# Patient Record
Sex: Female | Born: 1935 | Race: White | Hispanic: No | Marital: Married | Smoking: Never smoker
Health system: Southern US, Community
[De-identification: ages and names within clinical notes are randomized; demographics above are authoritative.]

## PROBLEM LIST (undated history)

## (undated) DIAGNOSIS — I10 Essential (primary) hypertension: Secondary | ICD-10-CM

## (undated) DIAGNOSIS — H409 Unspecified glaucoma: Secondary | ICD-10-CM

## (undated) DIAGNOSIS — H9190 Unspecified hearing loss, unspecified ear: Secondary | ICD-10-CM

## (undated) DIAGNOSIS — E079 Disorder of thyroid, unspecified: Secondary | ICD-10-CM

## (undated) DIAGNOSIS — E039 Hypothyroidism, unspecified: Secondary | ICD-10-CM

## (undated) DIAGNOSIS — I5032 Chronic diastolic (congestive) heart failure: Secondary | ICD-10-CM

## (undated) HISTORY — DX: Unspecified glaucoma: H40.9

## (undated) HISTORY — PX: SPLENECTOMY, TOTAL: SHX788

---

## 1998-10-02 HISTORY — PX: CHOLECYSTECTOMY: SHX55

## 1998-11-04 ENCOUNTER — Other Ambulatory Visit: Admission: RE | Admit: 1998-11-04 | Discharge: 1998-11-04 | Payer: Self-pay | Admitting: Obstetrics and Gynecology

## 2002-05-26 ENCOUNTER — Other Ambulatory Visit: Admission: RE | Admit: 2002-05-26 | Discharge: 2002-05-26 | Payer: Self-pay | Admitting: Internal Medicine

## 2002-11-27 ENCOUNTER — Encounter: Payer: Self-pay | Admitting: Urology

## 2002-12-04 ENCOUNTER — Inpatient Hospital Stay (HOSPITAL_COMMUNITY): Admission: RE | Admit: 2002-12-04 | Discharge: 2002-12-05 | Payer: Self-pay | Admitting: Urology

## 2008-01-23 ENCOUNTER — Ambulatory Visit (HOSPITAL_COMMUNITY): Admission: RE | Admit: 2008-01-23 | Discharge: 2008-01-24 | Payer: Self-pay | Admitting: Orthopedic Surgery

## 2008-10-02 HISTORY — PX: ABDOMINAL HYSTERECTOMY: SHX81

## 2009-06-22 ENCOUNTER — Inpatient Hospital Stay (HOSPITAL_COMMUNITY): Admission: EM | Admit: 2009-06-22 | Discharge: 2009-06-25 | Payer: Self-pay | Admitting: Emergency Medicine

## 2009-06-28 ENCOUNTER — Encounter (INDEPENDENT_AMBULATORY_CARE_PROVIDER_SITE_OTHER): Payer: Self-pay | Admitting: General Surgery

## 2009-06-28 ENCOUNTER — Inpatient Hospital Stay (HOSPITAL_COMMUNITY): Admission: EM | Admit: 2009-06-28 | Discharge: 2009-07-04 | Payer: Self-pay | Admitting: Emergency Medicine

## 2009-07-09 ENCOUNTER — Encounter (INDEPENDENT_AMBULATORY_CARE_PROVIDER_SITE_OTHER): Payer: Self-pay | Admitting: Cardiology

## 2009-07-09 ENCOUNTER — Inpatient Hospital Stay (HOSPITAL_COMMUNITY): Admission: AD | Admit: 2009-07-09 | Discharge: 2009-07-13 | Payer: Self-pay | Admitting: Cardiology

## 2009-07-09 ENCOUNTER — Encounter: Admission: RE | Admit: 2009-07-09 | Discharge: 2009-07-09 | Payer: Self-pay | Admitting: General Surgery

## 2009-07-12 ENCOUNTER — Encounter (INDEPENDENT_AMBULATORY_CARE_PROVIDER_SITE_OTHER): Payer: Self-pay | Admitting: Cardiology

## 2009-07-14 ENCOUNTER — Emergency Department (HOSPITAL_COMMUNITY): Admission: EM | Admit: 2009-07-14 | Discharge: 2009-07-14 | Payer: Self-pay | Admitting: Emergency Medicine

## 2011-01-05 LAB — DIFFERENTIAL
Basophils Relative: 1 % (ref 0–1)
Eosinophils Absolute: 0.4 10*3/uL (ref 0.0–0.7)
Eosinophils Relative: 3 % (ref 0–5)
Lymphocytes Relative: 15 % (ref 12–46)
Lymphs Abs: 1.4 10*3/uL (ref 0.7–4.0)
Lymphs Abs: 2.1 10*3/uL (ref 0.7–4.0)
Monocytes Absolute: 1.2 10*3/uL — ABNORMAL HIGH (ref 0.1–1.0)
Neutro Abs: 6.8 10*3/uL (ref 1.7–7.7)
Neutrophils Relative %: 68 % (ref 43–77)

## 2011-01-05 LAB — CBC
HCT: 29.6 % — ABNORMAL LOW (ref 36.0–46.0)
HCT: 30.2 % — ABNORMAL LOW (ref 36.0–46.0)
Hemoglobin: 9.8 g/dL — ABNORMAL LOW (ref 12.0–15.0)
Hemoglobin: 9.9 g/dL — ABNORMAL LOW (ref 12.0–15.0)
MCHC: 33.2 g/dL (ref 30.0–36.0)
MCHC: 33.3 g/dL (ref 30.0–36.0)
MCV: 92.6 fL (ref 78.0–100.0)
MCV: 93.9 fL (ref 78.0–100.0)
MCV: 94 fL (ref 78.0–100.0)
MCV: 94 fL (ref 78.0–100.0)
Platelets: 336 10*3/uL (ref 150–400)
Platelets: 489 10*3/uL — ABNORMAL HIGH (ref 150–400)
Platelets: 509 10*3/uL — ABNORMAL HIGH (ref 150–400)
Platelets: 640 10*3/uL — ABNORMAL HIGH (ref 150–400)
RBC: 3.14 MIL/uL — ABNORMAL LOW (ref 3.87–5.11)
RBC: 3.16 MIL/uL — ABNORMAL LOW (ref 3.87–5.11)
RDW: 15.4 % (ref 11.5–15.5)
RDW: 16.5 % — ABNORMAL HIGH (ref 11.5–15.5)
WBC: 10.3 10*3/uL (ref 4.0–10.5)
WBC: 11.7 10*3/uL — ABNORMAL HIGH (ref 4.0–10.5)
WBC: 12.1 10*3/uL — ABNORMAL HIGH (ref 4.0–10.5)
WBC: 13.3 10*3/uL — ABNORMAL HIGH (ref 4.0–10.5)

## 2011-01-05 LAB — D-DIMER, QUANTITATIVE: D-Dimer, Quant: 2.84 ug/mL-FEU — ABNORMAL HIGH (ref 0.00–0.48)

## 2011-01-05 LAB — COMPREHENSIVE METABOLIC PANEL
AST: 19 U/L (ref 0–37)
AST: 24 U/L (ref 0–37)
Albumin: 2.7 g/dL — ABNORMAL LOW (ref 3.5–5.2)
Albumin: 3 g/dL — ABNORMAL LOW (ref 3.5–5.2)
BUN: 7 mg/dL (ref 6–23)
Calcium: 9 mg/dL (ref 8.4–10.5)
Calcium: 9.5 mg/dL (ref 8.4–10.5)
Chloride: 102 mEq/L (ref 96–112)
Creatinine, Ser: 0.91 mg/dL (ref 0.4–1.2)
Creatinine, Ser: 0.93 mg/dL (ref 0.4–1.2)
GFR calc Af Amer: >60 mL/min (ref >60)
GFR calc Af Amer: >60 mL/min (ref >60)
Total Bilirubin: 0.4 mg/dL (ref 0.3–1.2)

## 2011-01-05 LAB — BASIC METABOLIC PANEL
BUN: 5 mg/dL — ABNORMAL LOW (ref 6–23)
Chloride: 106 mEq/L (ref 96–112)
Chloride: 110 mEq/L (ref 96–112)
Creatinine, Ser: 0.8 mg/dL (ref 0.4–1.2)
GFR calc Af Amer: >60 mL/min (ref >60)
Glucose, Bld: 93 mg/dL (ref 70–99)
Potassium: 3.4 mEq/L — ABNORMAL LOW (ref 3.5–5.1)
Potassium: 3.8 mEq/L (ref 3.5–5.1)

## 2011-01-05 LAB — PROTEIN S, TOTAL: Protein S Ag, Total: 119 % (ref 70–140)

## 2011-01-05 LAB — LIPASE, BLOOD: Lipase: 22 U/L (ref 11–59)

## 2011-01-05 LAB — PROTIME-INR
INR: 1.22 (ref 0.00–1.49)
INR: 3.01 — ABNORMAL HIGH (ref 0.00–1.49)
INR: 3.1 — ABNORMAL HIGH (ref 0.00–1.49)
Prothrombin Time: 25.3 seconds — ABNORMAL HIGH (ref 11.6–15.2)
Prothrombin Time: 31 seconds — ABNORMAL HIGH (ref 11.6–15.2)

## 2011-01-05 LAB — BETA-2-GLYCOPROTEIN I ABS, IGG/M/A: Beta-2-Glycoprotein I IgA: 4 U/mL (ref <=15)

## 2011-01-05 LAB — LUPUS ANTICOAGULANT PANEL
Drvvt confirmation: 1.13 Ratio (ref <1.18)
PTT Lupus Anticoagulant: 43.7 secs — ABNORMAL HIGH (ref 32.0–43.4)
PTTLA 4:1 Mix: 42.2 secs (ref 36.3–48.8)

## 2011-01-05 LAB — HEMOCCULT GUIAC POC 1CARD (OFFICE): Fecal Occult Bld: POSITIVE

## 2011-01-05 LAB — TSH: TSH: 16.256 u[IU]/mL — ABNORMAL HIGH (ref 0.350–4.500)

## 2011-01-05 LAB — CARDIOLIPIN ANTIBODIES, IGG, IGM, IGA
Anticardiolipin IgA: 5 APL U/mL — ABNORMAL LOW (ref <10)
Anticardiolipin IgG: 7 GPL U/mL — ABNORMAL LOW (ref <10)
Anticardiolipin IgM: 4 MPL U/mL — ABNORMAL LOW (ref <10)

## 2011-01-05 LAB — HEPARIN LEVEL (UNFRACTIONATED)
Heparin Unfractionated: 0.32 IU/mL (ref 0.30–0.70)
Heparin Unfractionated: 0.37 IU/mL (ref 0.30–0.70)

## 2011-01-05 LAB — LIPID PANEL
Cholesterol: 153 mg/dL (ref 0–200)
LDL Cholesterol: 99 mg/dL (ref 0–99)

## 2011-01-05 LAB — PROTHROMBIN GENE MUTATION

## 2011-01-06 LAB — HEMOGLOBIN AND HEMATOCRIT, BLOOD
HCT: 22.2 % — ABNORMAL LOW (ref 36.0–46.0)
HCT: 27.6 % — ABNORMAL LOW (ref 36.0–46.0)
HCT: 29.6 % — ABNORMAL LOW (ref 36.0–46.0)
HCT: 31.9 % — ABNORMAL LOW (ref 36.0–46.0)
HCT: 32 % — ABNORMAL LOW (ref 36.0–46.0)
HCT: 32.3 % — ABNORMAL LOW (ref 36.0–46.0)
HCT: 34.3 % — ABNORMAL LOW (ref 36.0–46.0)
Hemoglobin: 10.8 g/dL — ABNORMAL LOW (ref 12.0–15.0)
Hemoglobin: 10.9 g/dL — ABNORMAL LOW (ref 12.0–15.0)
Hemoglobin: 11.1 g/dL — ABNORMAL LOW (ref 12.0–15.0)
Hemoglobin: 11.4 g/dL — ABNORMAL LOW (ref 12.0–15.0)
Hemoglobin: 9.5 g/dL — ABNORMAL LOW (ref 12.0–15.0)

## 2011-01-06 LAB — POCT CARDIAC MARKERS
CKMB, poc: <1 ng/mL — ABNORMAL LOW (ref 1.0–8.0)
Myoglobin, poc: 72.3 ng/mL (ref 12–200)
Troponin i, poc: <0.05 ng/mL (ref 0.00–0.09)

## 2011-01-06 LAB — DIFFERENTIAL
Basophils Absolute: 0 10*3/uL (ref 0.0–0.1)
Basophils Absolute: 0 K/uL (ref 0.0–0.1)
Basophils Absolute: 0.1 10*3/uL (ref 0.0–0.1)
Basophils Relative: 0 % (ref 0–1)
Basophils Relative: 0 % (ref 0–1)
Basophils Relative: 1 % (ref 0–1)
Eosinophils Absolute: 0 10*3/uL (ref 0.0–0.7)
Eosinophils Absolute: 0 K/uL (ref 0.0–0.7)
Eosinophils Relative: 0 % (ref 0–5)
Eosinophils Relative: 0 % (ref 0–5)
Lymphocytes Relative: 12 % (ref 12–46)
Lymphocytes Relative: 6 % — ABNORMAL LOW (ref 12–46)
Lymphs Abs: 1 10*3/uL (ref 0.7–4.0)
Lymphs Abs: 1.3 K/uL (ref 0.7–4.0)
Monocytes Absolute: 1.2 K/uL — ABNORMAL HIGH (ref 0.1–1.0)
Monocytes Relative: 12 % (ref 3–12)
Monocytes Relative: 7 % (ref 3–12)
Neutro Abs: 13.8 10*3/uL — ABNORMAL HIGH (ref 1.7–7.7)
Neutro Abs: 8 K/uL — ABNORMAL HIGH (ref 1.7–7.7)
Neutrophils Relative %: 76 % (ref 43–77)
Neutrophils Relative %: 86 % — ABNORMAL HIGH (ref 43–77)
Neutrophils Relative %: 86 % — ABNORMAL HIGH (ref 43–77)

## 2011-01-06 LAB — COMPREHENSIVE METABOLIC PANEL
ALT: 17 U/L (ref 0–35)
ALT: 69 U/L — ABNORMAL HIGH (ref 0–35)
AST: 26 U/L (ref 0–37)
Albumin: 1.7 g/dL — ABNORMAL LOW (ref 3.5–5.2)
Alkaline Phosphatase: 143 U/L — ABNORMAL HIGH (ref 39–117)
Alkaline Phosphatase: 249 U/L — ABNORMAL HIGH (ref 39–117)
BUN: 18 mg/dL (ref 6–23)
CO2: 25 mEq/L (ref 19–32)
Calcium: 9.7 mg/dL (ref 8.4–10.5)
Chloride: 105 mEq/L (ref 96–112)
Creatinine, Ser: 0.91 mg/dL (ref 0.4–1.2)
GFR calc Af Amer: >60 mL/min (ref >60)
GFR calc non Af Amer: 52 mL/min — ABNORMAL LOW (ref >60)
Glucose, Bld: 162 mg/dL — ABNORMAL HIGH (ref 70–99)
Glucose, Bld: 225 mg/dL — ABNORMAL HIGH (ref 70–99)
Potassium: 4 mEq/L (ref 3.5–5.1)
Potassium: 4.7 mEq/L (ref 3.5–5.1)
Sodium: 135 mEq/L (ref 135–145)
Sodium: 137 mEq/L (ref 135–145)
Total Bilirubin: 0.6 mg/dL (ref 0.3–1.2)
Total Bilirubin: 1.6 mg/dL — ABNORMAL HIGH (ref 0.3–1.2)
Total Protein: 3.8 g/dL — ABNORMAL LOW (ref 6.0–8.3)
Total Protein: 6 g/dL (ref 6.0–8.3)

## 2011-01-06 LAB — CBC
HCT: 29.6 % — ABNORMAL LOW (ref 36.0–46.0)
HCT: 29.6 % — ABNORMAL LOW (ref 36.0–46.0)
HCT: 29.8 % — ABNORMAL LOW (ref 36.0–46.0)
HCT: 30.9 % — ABNORMAL LOW (ref 36.0–46.0)
Hemoglobin: 10.2 g/dL — ABNORMAL LOW (ref 12.0–15.0)
Hemoglobin: 10.4 g/dL — ABNORMAL LOW (ref 12.0–15.0)
Hemoglobin: 10.7 g/dL — ABNORMAL LOW (ref 12.0–15.0)
Hemoglobin: 10.9 g/dL — ABNORMAL LOW (ref 12.0–15.0)
Hemoglobin: 13 g/dL (ref 12.0–15.0)
Hemoglobin: 8.6 g/dL — ABNORMAL LOW (ref 12.0–15.0)
Hemoglobin: 9.9 g/dL — ABNORMAL LOW (ref 12.0–15.0)
MCHC: 33.5 g/dL (ref 30.0–36.0)
MCHC: 33.7 g/dL (ref 30.0–36.0)
MCHC: 34.1 g/dL (ref 30.0–36.0)
MCHC: 34.5 g/dL (ref 30.0–36.0)
MCHC: 34.6 g/dL (ref 30.0–36.0)
MCHC: 35.2 g/dL (ref 30.0–36.0)
MCV: 93 fL (ref 78.0–100.0)
MCV: 93.6 fL (ref 78.0–100.0)
MCV: 93.6 fL (ref 78.0–100.0)
MCV: 93.7 fL (ref 78.0–100.0)
MCV: 94.3 fL (ref 78.0–100.0)
MCV: 95.4 fL (ref 78.0–100.0)
Platelets: 174 10*3/uL (ref 150–400)
Platelets: 196 10*3/uL (ref 150–400)
Platelets: 223 10*3/uL (ref 150–400)
Platelets: 79 K/uL — ABNORMAL LOW (ref 150–400)
Platelets: 90 K/uL — ABNORMAL LOW (ref 150–400)
RBC: 3.16 MIL/uL — ABNORMAL LOW (ref 3.87–5.11)
RBC: 3.27 MIL/uL — ABNORMAL LOW (ref 3.87–5.11)
RBC: 3.41 MIL/uL — ABNORMAL LOW (ref 3.87–5.11)
RBC: 4.08 MIL/uL (ref 3.87–5.11)
RDW: 12.7 % (ref 11.5–15.5)
RDW: 12.8 % (ref 11.5–15.5)
RDW: 13 % (ref 11.5–15.5)
RDW: 13.8 % (ref 11.5–15.5)
RDW: 15.2 % (ref 11.5–15.5)
RDW: 15.3 % (ref 11.5–15.5)
RDW: 15.4 % (ref 11.5–15.5)
WBC: 10.6 10*3/uL — ABNORMAL HIGH (ref 4.0–10.5)
WBC: 11.4 K/uL — ABNORMAL HIGH (ref 4.0–10.5)
WBC: 11.7 10*3/uL — ABNORMAL HIGH (ref 4.0–10.5)
WBC: 12.1 K/uL — ABNORMAL HIGH (ref 4.0–10.5)

## 2011-01-06 LAB — BASIC METABOLIC PANEL WITH GFR
BUN: 14 mg/dL (ref 6–23)
CO2: 28 meq/L (ref 19–32)
Calcium: 9 mg/dL (ref 8.4–10.5)
Chloride: 108 meq/L (ref 96–112)
Creatinine, Ser: 0.98 mg/dL (ref 0.4–1.2)
GFR calc non Af Amer: 55 mL/min — ABNORMAL LOW
Glucose, Bld: 142 mg/dL — ABNORMAL HIGH (ref 70–99)
Potassium: 3.9 meq/L (ref 3.5–5.1)
Sodium: 140 meq/L (ref 135–145)

## 2011-01-06 LAB — BASIC METABOLIC PANEL
CO2: 22 mEq/L (ref 19–32)
CO2: 23 mEq/L (ref 19–32)
Calcium: 7.2 mg/dL — ABNORMAL LOW (ref 8.4–10.5)
Calcium: 7.3 mg/dL — ABNORMAL LOW (ref 8.4–10.5)
Chloride: 111 mEq/L (ref 96–112)
Creatinine, Ser: 0.71 mg/dL (ref 0.4–1.2)
Creatinine, Ser: 0.77 mg/dL (ref 0.4–1.2)
GFR calc Af Amer: >60 mL/min (ref >60)
Glucose, Bld: 127 mg/dL — ABNORMAL HIGH (ref 70–99)
Glucose, Bld: 197 mg/dL — ABNORMAL HIGH (ref 70–99)
Sodium: 136 mEq/L (ref 135–145)

## 2011-01-06 LAB — CROSSMATCH
ABO/RH(D): O POS
Antibody Screen: NEGATIVE

## 2011-01-06 LAB — PROTIME-INR
INR: 1.2 (ref 0.00–1.49)
Prothrombin Time: 14.8 s (ref 11.6–15.2)

## 2011-01-06 LAB — URINALYSIS, ROUTINE W REFLEX MICROSCOPIC
Protein, ur: NEGATIVE mg/dL
Specific Gravity, Urine: 1.031 — ABNORMAL HIGH (ref 1.005–1.030)
Urobilinogen, UA: 1 mg/dL (ref 0.0–1.0)

## 2011-01-06 LAB — D-DIMER, QUANTITATIVE

## 2011-01-06 LAB — TSH: TSH: 0.304 u[IU]/mL — ABNORMAL LOW (ref 0.350–4.500)

## 2011-01-06 LAB — SAMPLE TO BLOOD BANK

## 2011-01-06 LAB — APTT: aPTT: 24 seconds (ref 24–37)

## 2011-01-06 LAB — TYPE AND SCREEN
ABO/RH(D): O POS
Antibody Screen: NEGATIVE

## 2011-01-06 LAB — URINE MICROSCOPIC-ADD ON

## 2011-02-14 NOTE — Op Note (Signed)
NAMERUTHELLEN, TIPPY          ACCOUNT NO.:  [id number]   MEDICAL RECORD NO.:  [id number]          PATIENT TYPE:  AMB   LOCATION:  DAY                          FACILITY:  [facility]   PHYSICIAN:  Georges Lynch. Gioffre, M.D.DATE OF BIRTH:  [birth year]   DATE OF PROCEDURE:  01/23/2008  DATE OF DISCHARGE:                               OPERATIVE REPORT   SURGEON:  Georges Lynch. Darrelyn Hillock, M.D.   ASSISTANT:  Marlowe Kays, M.D.   PREOPERATIVE DIAGNOSES:  1. 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  2. 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   POSTOPERATIVE DIAGNOSES:  1. 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  2. 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   PROCEDURE:  Under general anesthesia, the patient on spinal frame, she  first had 1 gram of IV Ancef.  A routine orthopedic prep of the lower  back was carried out.  Two needles were placed in the back for  localization purposes.  At this time, an x-ray was taken to verify the  L5-S1 space.  At this particular time, an incision was made over the L5-  S1 interspace.  Bleeders identified and cauterized.  I separated the  muscle on both sides in order to position the [facility] retractor.  I  then went down the right side only for the hemilaminectomy and  completely decompressed the lateral recess.  We did this with the  microscope.  Another x-ray was taken.  A second x-ray was taken prior to  the decompression.  Once we did our hemilaminectomy, this time removed  the ligamentum flavum, I went down and did a nice foraminotomy for the  S1 root.  We went up proximally and laterally and decompressed the  lateral recess.  She had rather significant stenosis.  We cauterized the  lateral recess veins.  We then placed a needle into the disk space and  another x-ray was taken to verify our position.  We then went down into  the disk space and made a  cruciate incision.  The disk space was small,  but we were able to easily get into the space and we utilized a nerve  hook and the Epstein curettes to go out far laterally to decompress  laterally the foraminal disk fragments into the space and then removed  those.  I removed several large fragments with the nerve from the  foraminal area.  Great care was taken not to go out too far laterally.  We thoroughly irrigated out the area.  We were able to easily pass the  hockey stick out the foramina for both roots, the 5 root and the S1  root.  We had a nice thorough decompression, thoroughly irrigated out  the area, and loosely applied some thrombin-soaked Gelfoam, and the  wound was closed in  layers in usual fashion.  I left a small deep distal part of the wound  open for drainage purposes.  Subcu was closed with #0 Vicryl, skin with  metal staples.  Sterile Neosporin dressing was applied.  The patient  left the operating room in satisfactory condition.           ______________________________  Georges Lynch Darrelyn Hillock, M.D.     RAG/MEDQ  D:  01/23/2008  T:  01/23/2008  Job:  [id number]   cc:   Kirby Funk, M.D.  Fax:  [phone]

## 2011-02-17 NOTE — Op Note (Signed)
Mackenzie Madden, Mackenzie Madden                       ACCOUNT NO.:  [id number]   MEDICAL RECORD NO.:  [id number]                   PATIENT TYPE:  AMB   LOCATION:  DAY                                  FACILITY:  [facility]   PHYSICIAN:  Sigmund I. Patsi Sears, M.D.         DATE OF BIRTH:  06/26/1928   DATE OF PROCEDURE:  12/04/2002  DATE OF DISCHARGE:                                 OPERATIVE REPORT   PREOPERATIVE DIAGNOSES:  1. Stress urinary incontinence.  2. Cystocele.   POSTOPERATIVE DIAGNOSES:  1. Stress urinary incontinence.  2. Cystocele.   PROCEDURES:  1. Tension free obturator tape urethral sling.  2. Anterior repair.   SURGEON:  Sigmund I. Patsi Sears, M.D.   ASSISTANTS:  1. Crecencio Mc, M.D.  2. [facility]   ANESTHESIA:  General.   COMPLICATIONS:  None.   INDICATION:  Ms. Bozard is a 77 year old white female with stress  urinary incontinence demonstrated by urodynamics.  The patient also was  noted to have a grade 3 cystocele on physical examination.  After discussion  of options, the patient elected to proceed with a urethral sling as well as  anterior repair at that time.  Potential risks and benefits of the procedure  were explained to the patient, and she consented.   DESCRIPTION OF PROCEDURE:  The patient was taken to the operating room, and  a general anesthetic was administered.  The patient was given preoperative  antibiotics, placed in the dorsal lithotomy position, and prepped and draped  in the usual sterile fashion.  Next, a 16 French Foley catheter was inserted  in the bladder, and the bladder was drained.  Lidocaine 0.5% with  epinephrine was then injected into the anterior vaginal mucosa.  A total of  10 mL was injected.  Next, a vertical incision was made in the anterior  vaginal mucosa overlying the mid urethra.  Sharp dissection was then used to  dissect laterally on either side of the urethra until the anterior pubic  ramus could be easily  palpated with an index finger.  A small stab incision  was then made approximately 3 cm laterally at the level of the clitoral hood  over the skin.  The obturator trocars were then used to pierce the obturator  membrane and encircle the inferior pubic ramus.  The trocar was then brought  out through the vaginal incision under digital dissection.  The  polypropylene mesh sling was then attached to the trocar and brought back  out through the cutaneous incision.  A similar procedure was then performed  on the contralateral side.  The sling was then pulled up until it was in the  proper position overlying the mid urethra.  A right-angle was placed between  the urethra and the sling to ensure that it was not pulled too tightly.  The  vaginal mucosa was then closed with a running 3-0 Vicryl suture.  Cystoscopy  was then performed with  the 70 degree lens.  This demonstrated no evidence  of any bladder or urethral injury.  Foley catheter was then re-placed, and  the sling was cut at the skin level on either side.  These incision sites  were then closed with Dermabond.  Attention then turned to the anterior  vaginal repair.  A separate vertical incision overlying the anterior vaginal  mucosa was made superior to the previous incision.  The vaginal mucosa was  then sharply and bluntly dissected free from the underlying bladder.  Once  the vaginal mucosa had been mobilized from the bladder sufficiently both  laterally and superiorly, horizontal mattress sutures with 3-0 Vicryl were  placed into the pubovesical fascia on either side and tied down.  This  allowed the bladder to be held back up in its normal anatomic position.  A  total of four of these sutures were placed.  A small portion of redundant  vaginal mucosa was then excised.  Vaginal mucosa was then closed with a  running 3-0 Vicryl suture.  A vaginal packing with Estrace was placed, and  the procedure was ended.  The patient appeared to  tolerate the procedure  well and without complications.  She was able to be transferred to the  recovery unit in satisfactory condition.  Please note that Sigmund I.  Patsi Sears, M.D. was the operating surgeon and was present and participated  in the entire procedure.     Crecencio Mc, M.D.                          Sigmund I. Patsi Sears, M.D.    LB/MEDQ  D:  12/04/2002  T:  12/04/2002  Job:  [id number]

## 2011-02-21 ENCOUNTER — Other Ambulatory Visit: Payer: Self-pay | Admitting: Podiatry

## 2011-05-19 ENCOUNTER — Other Ambulatory Visit: Payer: Self-pay | Admitting: Internal Medicine

## 2011-05-19 ENCOUNTER — Ambulatory Visit
Admission: RE | Admit: 2011-05-19 | Discharge: 2011-05-19 | Disposition: A | Discharge status: Home / Self Care | Payer: Medicare Other | Source: Ambulatory Visit | Attending: Internal Medicine | Admitting: Internal Medicine

## 2011-05-19 DIAGNOSIS — R52 Pain, unspecified: Secondary | ICD-10-CM

## 2011-05-19 MED ORDER — IOHEXOL 300 MG/ML  SOLN
100.0000 mL | Freq: Once | INTRAMUSCULAR | Status: AC | PRN
  Administered 2011-05-19: 100 mL via INTRAVENOUS

## 2011-06-27 LAB — COMPREHENSIVE METABOLIC PANEL
ALT: 15
AST: 22
Albumin: 3.6
CO2: 30
Calcium: 9.6
Creatinine, Ser: 0.95
GFR calc Af Amer: >60
Sodium: 143
Total Protein: 6.5

## 2011-06-27 LAB — DIFFERENTIAL
Eosinophils Absolute: 0.1
Eosinophils Relative: 1
Lymphocytes Relative: 21
Lymphs Abs: 1.7
Monocytes Absolute: 0.6
Monocytes Relative: 7

## 2011-06-27 LAB — URINALYSIS, ROUTINE W REFLEX MICROSCOPIC
Bilirubin Urine: NEGATIVE
Glucose, UA: NEGATIVE
Hgb urine dipstick: NEGATIVE
Ketones, ur: NEGATIVE
Nitrite: NEGATIVE
Protein, ur: NEGATIVE
Specific Gravity, Urine: 1.007
Urobilinogen, UA: 0.2
pH: 7.5

## 2011-06-27 LAB — URINE MICROSCOPIC-ADD ON

## 2011-06-27 LAB — URINE CULTURE

## 2011-06-27 LAB — CBC
MCHC: 34.2
Platelets: 204
RBC: 4.56
RDW: 12.3

## 2011-07-26 ENCOUNTER — Other Ambulatory Visit: Payer: Self-pay | Admitting: Dermatology

## 2011-12-06 DIAGNOSIS — Z85828 Personal history of other malignant neoplasm of skin: Secondary | ICD-10-CM | POA: Diagnosis not present

## 2012-01-31 DIAGNOSIS — H409 Unspecified glaucoma: Secondary | ICD-10-CM | POA: Diagnosis not present

## 2012-01-31 DIAGNOSIS — H4011X0 Primary open-angle glaucoma, stage unspecified: Secondary | ICD-10-CM | POA: Diagnosis not present

## 2012-02-08 DIAGNOSIS — E039 Hypothyroidism, unspecified: Secondary | ICD-10-CM | POA: Diagnosis not present

## 2012-02-08 DIAGNOSIS — Z1331 Encounter for screening for depression: Secondary | ICD-10-CM | POA: Diagnosis not present

## 2012-02-08 DIAGNOSIS — I10 Essential (primary) hypertension: Secondary | ICD-10-CM | POA: Diagnosis not present

## 2012-02-20 DIAGNOSIS — Z1231 Encounter for screening mammogram for malignant neoplasm of breast: Secondary | ICD-10-CM | POA: Diagnosis not present

## 2012-03-06 DIAGNOSIS — L219 Seborrheic dermatitis, unspecified: Secondary | ICD-10-CM | POA: Diagnosis not present

## 2012-03-06 DIAGNOSIS — L821 Other seborrheic keratosis: Secondary | ICD-10-CM | POA: Diagnosis not present

## 2012-03-06 DIAGNOSIS — Z85828 Personal history of other malignant neoplasm of skin: Secondary | ICD-10-CM | POA: Diagnosis not present

## 2012-04-02 DIAGNOSIS — H4011X0 Primary open-angle glaucoma, stage unspecified: Secondary | ICD-10-CM | POA: Diagnosis not present

## 2012-04-02 DIAGNOSIS — H409 Unspecified glaucoma: Secondary | ICD-10-CM | POA: Diagnosis not present

## 2012-06-25 DIAGNOSIS — Z23 Encounter for immunization: Secondary | ICD-10-CM | POA: Diagnosis not present

## 2012-08-06 DIAGNOSIS — I10 Essential (primary) hypertension: Secondary | ICD-10-CM | POA: Diagnosis not present

## 2012-10-01 DIAGNOSIS — H409 Unspecified glaucoma: Secondary | ICD-10-CM | POA: Diagnosis not present

## 2012-10-01 DIAGNOSIS — H4011X0 Primary open-angle glaucoma, stage unspecified: Secondary | ICD-10-CM | POA: Diagnosis not present

## 2012-10-01 DIAGNOSIS — H2640 Unspecified secondary cataract: Secondary | ICD-10-CM | POA: Diagnosis not present

## 2012-10-01 DIAGNOSIS — H52209 Unspecified astigmatism, unspecified eye: Secondary | ICD-10-CM | POA: Diagnosis not present

## 2012-10-17 DIAGNOSIS — H2640 Unspecified secondary cataract: Secondary | ICD-10-CM | POA: Diagnosis not present

## 2012-10-17 DIAGNOSIS — H26499 Other secondary cataract, unspecified eye: Secondary | ICD-10-CM | POA: Diagnosis not present

## 2012-10-24 DIAGNOSIS — H26499 Other secondary cataract, unspecified eye: Secondary | ICD-10-CM | POA: Diagnosis not present

## 2012-10-24 DIAGNOSIS — H2640 Unspecified secondary cataract: Secondary | ICD-10-CM | POA: Diagnosis not present

## 2013-02-12 DIAGNOSIS — I10 Essential (primary) hypertension: Secondary | ICD-10-CM | POA: Diagnosis not present

## 2013-02-12 DIAGNOSIS — Z0000 Encounter for general adult medical examination without abnormal findings: Secondary | ICD-10-CM | POA: Diagnosis not present

## 2013-02-12 DIAGNOSIS — M810 Age-related osteoporosis without current pathological fracture: Secondary | ICD-10-CM | POA: Diagnosis not present

## 2013-02-12 DIAGNOSIS — G4700 Insomnia, unspecified: Secondary | ICD-10-CM | POA: Diagnosis not present

## 2013-02-12 DIAGNOSIS — Z1331 Encounter for screening for depression: Secondary | ICD-10-CM | POA: Diagnosis not present

## 2013-02-12 DIAGNOSIS — E039 Hypothyroidism, unspecified: Secondary | ICD-10-CM | POA: Diagnosis not present

## 2013-02-20 DIAGNOSIS — Z1231 Encounter for screening mammogram for malignant neoplasm of breast: Secondary | ICD-10-CM | POA: Diagnosis not present

## 2013-03-31 DIAGNOSIS — M810 Age-related osteoporosis without current pathological fracture: Secondary | ICD-10-CM | POA: Diagnosis not present

## 2013-04-21 DIAGNOSIS — Z85828 Personal history of other malignant neoplasm of skin: Secondary | ICD-10-CM | POA: Diagnosis not present

## 2013-04-21 DIAGNOSIS — D235 Other benign neoplasm of skin of trunk: Secondary | ICD-10-CM | POA: Diagnosis not present

## 2013-04-21 DIAGNOSIS — L570 Actinic keratosis: Secondary | ICD-10-CM | POA: Diagnosis not present

## 2013-05-07 DIAGNOSIS — H409 Unspecified glaucoma: Secondary | ICD-10-CM | POA: Diagnosis not present

## 2013-05-07 DIAGNOSIS — H4011X0 Primary open-angle glaucoma, stage unspecified: Secondary | ICD-10-CM | POA: Diagnosis not present

## 2013-05-16 DIAGNOSIS — M25579 Pain in unspecified ankle and joints of unspecified foot: Secondary | ICD-10-CM | POA: Diagnosis not present

## 2013-05-16 DIAGNOSIS — S93609A Unspecified sprain of unspecified foot, initial encounter: Secondary | ICD-10-CM | POA: Diagnosis not present

## 2013-06-11 DIAGNOSIS — H902 Conductive hearing loss, unspecified: Secondary | ICD-10-CM | POA: Diagnosis not present

## 2013-06-11 DIAGNOSIS — H6120 Impacted cerumen, unspecified ear: Secondary | ICD-10-CM | POA: Diagnosis not present

## 2013-07-10 DIAGNOSIS — Z23 Encounter for immunization: Secondary | ICD-10-CM | POA: Diagnosis not present

## 2013-07-16 DIAGNOSIS — M899 Disorder of bone, unspecified: Secondary | ICD-10-CM | POA: Diagnosis not present

## 2013-07-16 DIAGNOSIS — I10 Essential (primary) hypertension: Secondary | ICD-10-CM | POA: Diagnosis not present

## 2013-10-01 ENCOUNTER — Other Ambulatory Visit: Payer: Self-pay | Admitting: Internal Medicine

## 2013-10-01 ENCOUNTER — Ambulatory Visit
Admission: RE | Admit: 2013-10-01 | Discharge: 2013-10-01 | Disposition: A | Discharge status: Home / Self Care | Payer: Medicare Other | Source: Ambulatory Visit | Attending: Internal Medicine | Admitting: Internal Medicine

## 2013-10-01 DIAGNOSIS — R0781 Pleurodynia: Secondary | ICD-10-CM

## 2013-10-01 DIAGNOSIS — W1809XA Striking against other object with subsequent fall, initial encounter: Secondary | ICD-10-CM | POA: Diagnosis not present

## 2013-10-01 DIAGNOSIS — J438 Other emphysema: Secondary | ICD-10-CM | POA: Diagnosis not present

## 2013-10-01 DIAGNOSIS — R079 Chest pain, unspecified: Secondary | ICD-10-CM | POA: Diagnosis not present

## 2013-10-06 ENCOUNTER — Other Ambulatory Visit: Payer: Self-pay | Admitting: Internal Medicine

## 2013-10-06 ENCOUNTER — Ambulatory Visit
Admission: RE | Admit: 2013-10-06 | Discharge: 2013-10-06 | Disposition: A | Discharge status: Home / Self Care | Payer: Medicare Other | Source: Ambulatory Visit | Attending: Internal Medicine | Admitting: Internal Medicine

## 2013-10-06 DIAGNOSIS — R079 Chest pain, unspecified: Secondary | ICD-10-CM

## 2013-10-06 DIAGNOSIS — S298XXA Other specified injuries of thorax, initial encounter: Secondary | ICD-10-CM | POA: Diagnosis not present

## 2013-10-06 DIAGNOSIS — R071 Chest pain on breathing: Secondary | ICD-10-CM | POA: Diagnosis not present

## 2013-10-23 ENCOUNTER — Other Ambulatory Visit: Payer: Self-pay | Admitting: Internal Medicine

## 2013-10-23 ENCOUNTER — Ambulatory Visit
Admission: RE | Admit: 2013-10-23 | Discharge: 2013-10-23 | Disposition: A | Discharge status: Home / Self Care | Payer: Medicare Other | Source: Ambulatory Visit | Attending: Internal Medicine | Admitting: Internal Medicine

## 2013-10-23 DIAGNOSIS — R0781 Pleurodynia: Secondary | ICD-10-CM

## 2013-10-23 DIAGNOSIS — S2249XA Multiple fractures of ribs, unspecified side, initial encounter for closed fracture: Secondary | ICD-10-CM | POA: Diagnosis not present

## 2013-10-31 DIAGNOSIS — J209 Acute bronchitis, unspecified: Secondary | ICD-10-CM | POA: Diagnosis not present

## 2013-10-31 DIAGNOSIS — R042 Hemoptysis: Secondary | ICD-10-CM | POA: Diagnosis not present

## 2013-11-04 DIAGNOSIS — H52209 Unspecified astigmatism, unspecified eye: Secondary | ICD-10-CM | POA: Diagnosis not present

## 2013-11-04 DIAGNOSIS — Z961 Presence of intraocular lens: Secondary | ICD-10-CM | POA: Diagnosis not present

## 2013-11-04 DIAGNOSIS — H409 Unspecified glaucoma: Secondary | ICD-10-CM | POA: Diagnosis not present

## 2013-11-04 DIAGNOSIS — H4011X0 Primary open-angle glaucoma, stage unspecified: Secondary | ICD-10-CM | POA: Diagnosis not present

## 2014-01-15 DIAGNOSIS — E039 Hypothyroidism, unspecified: Secondary | ICD-10-CM | POA: Diagnosis not present

## 2014-01-15 DIAGNOSIS — I10 Essential (primary) hypertension: Secondary | ICD-10-CM | POA: Diagnosis not present

## 2014-01-15 DIAGNOSIS — Z23 Encounter for immunization: Secondary | ICD-10-CM | POA: Diagnosis not present

## 2014-03-05 DIAGNOSIS — L570 Actinic keratosis: Secondary | ICD-10-CM | POA: Diagnosis not present

## 2014-03-05 DIAGNOSIS — L821 Other seborrheic keratosis: Secondary | ICD-10-CM | POA: Diagnosis not present

## 2014-03-11 DIAGNOSIS — H6120 Impacted cerumen, unspecified ear: Secondary | ICD-10-CM | POA: Diagnosis not present

## 2014-03-11 DIAGNOSIS — H60399 Other infective otitis externa, unspecified ear: Secondary | ICD-10-CM | POA: Diagnosis not present

## 2014-03-30 DIAGNOSIS — Z0000 Encounter for general adult medical examination without abnormal findings: Secondary | ICD-10-CM | POA: Diagnosis not present

## 2014-05-05 DIAGNOSIS — H4011X0 Primary open-angle glaucoma, stage unspecified: Secondary | ICD-10-CM | POA: Diagnosis not present

## 2014-05-05 DIAGNOSIS — H409 Unspecified glaucoma: Secondary | ICD-10-CM | POA: Diagnosis not present

## 2014-06-09 DIAGNOSIS — H4011X0 Primary open-angle glaucoma, stage unspecified: Secondary | ICD-10-CM | POA: Diagnosis not present

## 2014-06-09 DIAGNOSIS — H409 Unspecified glaucoma: Secondary | ICD-10-CM | POA: Diagnosis not present

## 2014-07-10 DIAGNOSIS — Z23 Encounter for immunization: Secondary | ICD-10-CM | POA: Diagnosis not present

## 2014-07-14 DIAGNOSIS — N183 Chronic kidney disease, stage 3 (moderate): Secondary | ICD-10-CM | POA: Diagnosis not present

## 2014-07-14 DIAGNOSIS — M810 Age-related osteoporosis without current pathological fracture: Secondary | ICD-10-CM | POA: Diagnosis not present

## 2014-07-14 DIAGNOSIS — I10 Essential (primary) hypertension: Secondary | ICD-10-CM | POA: Diagnosis not present

## 2014-09-17 DIAGNOSIS — Z1231 Encounter for screening mammogram for malignant neoplasm of breast: Secondary | ICD-10-CM | POA: Diagnosis not present

## 2014-11-18 ENCOUNTER — Other Ambulatory Visit: Payer: Self-pay | Admitting: Dermatology

## 2014-11-18 DIAGNOSIS — C44311 Basal cell carcinoma of skin of nose: Secondary | ICD-10-CM | POA: Diagnosis not present

## 2014-12-07 DIAGNOSIS — H52203 Unspecified astigmatism, bilateral: Secondary | ICD-10-CM | POA: Diagnosis not present

## 2014-12-07 DIAGNOSIS — H4011X2 Primary open-angle glaucoma, moderate stage: Secondary | ICD-10-CM | POA: Diagnosis not present

## 2014-12-23 DIAGNOSIS — C44311 Basal cell carcinoma of skin of nose: Secondary | ICD-10-CM | POA: Diagnosis not present

## 2015-01-13 DIAGNOSIS — C44311 Basal cell carcinoma of skin of nose: Secondary | ICD-10-CM | POA: Diagnosis not present

## 2015-01-20 DIAGNOSIS — T8189XD Other complications of procedures, not elsewhere classified, subsequent encounter: Secondary | ICD-10-CM | POA: Diagnosis not present

## 2015-02-03 DIAGNOSIS — C44319 Basal cell carcinoma of skin of other parts of face: Secondary | ICD-10-CM | POA: Diagnosis not present

## 2015-02-21 ENCOUNTER — Encounter (HOSPITAL_COMMUNITY): Payer: Self-pay | Admitting: Physical Medicine and Rehabilitation

## 2015-02-21 ENCOUNTER — Observation Stay (HOSPITAL_COMMUNITY)
Admission: EM | Admit: 2015-02-21 | Discharge: 2015-02-22 | Disposition: A | Discharge status: Home / Self Care | Payer: Medicare Other | Attending: Internal Medicine | Admitting: Internal Medicine

## 2015-02-21 ENCOUNTER — Emergency Department (HOSPITAL_COMMUNITY): Payer: Medicare Other

## 2015-02-21 DIAGNOSIS — I5032 Chronic diastolic (congestive) heart failure: Secondary | ICD-10-CM | POA: Diagnosis not present

## 2015-02-21 DIAGNOSIS — Z8249 Family history of ischemic heart disease and other diseases of the circulatory system: Secondary | ICD-10-CM | POA: Diagnosis not present

## 2015-02-21 DIAGNOSIS — Z90710 Acquired absence of both cervix and uterus: Secondary | ICD-10-CM | POA: Diagnosis not present

## 2015-02-21 DIAGNOSIS — E039 Hypothyroidism, unspecified: Secondary | ICD-10-CM | POA: Insufficient documentation

## 2015-02-21 DIAGNOSIS — R4020 Unspecified coma: Secondary | ICD-10-CM | POA: Diagnosis not present

## 2015-02-21 DIAGNOSIS — N179 Acute kidney failure, unspecified: Secondary | ICD-10-CM | POA: Diagnosis not present

## 2015-02-21 DIAGNOSIS — E038 Other specified hypothyroidism: Secondary | ICD-10-CM

## 2015-02-21 DIAGNOSIS — H40059 Ocular hypertension, unspecified eye: Secondary | ICD-10-CM | POA: Insufficient documentation

## 2015-02-21 DIAGNOSIS — R55 Syncope and collapse: Principal | ICD-10-CM | POA: Diagnosis present

## 2015-02-21 DIAGNOSIS — R531 Weakness: Secondary | ICD-10-CM | POA: Diagnosis not present

## 2015-02-21 DIAGNOSIS — I10 Essential (primary) hypertension: Secondary | ICD-10-CM | POA: Insufficient documentation

## 2015-02-21 HISTORY — DX: Essential (primary) hypertension: I10

## 2015-02-21 HISTORY — DX: Disorder of thyroid, unspecified: E07.9

## 2015-02-21 LAB — COMPREHENSIVE METABOLIC PANEL
ALBUMIN: 3.4 g/dL — AB (ref 3.5–5.0)
ALT: 13 U/L — ABNORMAL LOW (ref 14–54)
ANION GAP: 6 (ref 5–15)
AST: 22 U/L (ref 15–41)
Alkaline Phosphatase: 62 U/L (ref 38–126)
BILIRUBIN TOTAL: 0.5 mg/dL (ref 0.3–1.2)
BUN: 18 mg/dL (ref 6–20)
CO2: 29 mmol/L (ref 22–32)
Calcium: 9.6 mg/dL (ref 8.9–10.3)
Chloride: 105 mmol/L (ref 101–111)
Creatinine, Ser: 1.19 mg/dL — ABNORMAL HIGH (ref 0.44–1.00)
GFR calc Af Amer: 47 mL/min — ABNORMAL LOW (ref >60)
GFR, EST NON AFRICAN AMERICAN: 40 mL/min — AB (ref >60)
GLUCOSE: 93 mg/dL (ref 65–99)
POTASSIUM: 4.2 mmol/L (ref 3.5–5.1)
Sodium: 140 mmol/L (ref 135–145)
TOTAL PROTEIN: 7.1 g/dL (ref 6.5–8.1)

## 2015-02-21 LAB — CBC WITH DIFFERENTIAL/PLATELET
BASOS ABS: 0 10*3/uL (ref 0.0–0.1)
Basophils Relative: 0 % (ref 0–1)
Eosinophils Absolute: 0.1 10*3/uL (ref 0.0–0.7)
Eosinophils Relative: 1 % (ref 0–5)
HEMATOCRIT: 41.5 % (ref 36.0–46.0)
Hemoglobin: 13.5 g/dL (ref 12.0–15.0)
LYMPHS ABS: 2.3 10*3/uL (ref 0.7–4.0)
Lymphocytes Relative: 26 % (ref 12–46)
MCH: 31 pg (ref 26.0–34.0)
MCHC: 32.5 g/dL (ref 30.0–36.0)
MCV: 95.2 fL (ref 78.0–100.0)
MONO ABS: 0.8 10*3/uL (ref 0.1–1.0)
Monocytes Relative: 9 % (ref 3–12)
NEUTROS PCT: 64 % (ref 43–77)
Neutro Abs: 5.7 10*3/uL (ref 1.7–7.7)
Platelets: 192 10*3/uL (ref 150–400)
RBC: 4.36 MIL/uL (ref 3.87–5.11)
RDW: 13.9 % (ref 11.5–15.5)
WBC: 8.8 10*3/uL (ref 4.0–10.5)

## 2015-02-21 LAB — URINE MICROSCOPIC-ADD ON

## 2015-02-21 LAB — URINALYSIS, ROUTINE W REFLEX MICROSCOPIC
Bilirubin Urine: NEGATIVE
Glucose, UA: NEGATIVE mg/dL
HGB URINE DIPSTICK: NEGATIVE
Ketones, ur: NEGATIVE mg/dL
Nitrite: NEGATIVE
Protein, ur: NEGATIVE mg/dL
SPECIFIC GRAVITY, URINE: 1.007 (ref 1.005–1.030)
UROBILINOGEN UA: 0.2 mg/dL (ref 0.0–1.0)
pH: 7 (ref 5.0–8.0)

## 2015-02-21 LAB — TSH: TSH: 3.161 u[IU]/mL (ref 0.350–4.500)

## 2015-02-21 LAB — I-STAT TROPONIN, ED: TROPONIN I, POC: 0 ng/mL (ref 0.00–0.08)

## 2015-02-21 LAB — T4, FREE: Free T4: 1.13 ng/dL — ABNORMAL HIGH (ref 0.61–1.12)

## 2015-02-21 MED ORDER — AMLODIPINE BESYLATE 5 MG PO TABS
5.0000 mg | ORAL_TABLET | Freq: Every day | ORAL | Status: DC
Start: 2015-02-21 — End: 2015-02-22
  Administered 2015-02-22: 5 mg via ORAL
  Filled 2015-02-21: qty 1

## 2015-02-21 MED ORDER — SODIUM CHLORIDE 0.45 % IV SOLN
INTRAVENOUS | Status: DC
  Administered 2015-02-21: 19:00:00 via INTRAVENOUS

## 2015-02-21 MED ORDER — ONDANSETRON HCL 4 MG PO TABS: 4.0000 mg | ORAL_TABLET | Freq: Four times a day (QID) | ORAL | Status: DC | PRN

## 2015-02-21 MED ORDER — ALUM & MAG HYDROXIDE-SIMETH 200-200-20 MG/5ML PO SUSP: 30.0000 mL | Freq: Four times a day (QID) | ORAL | Status: DC | PRN

## 2015-02-21 MED ORDER — ENOXAPARIN SODIUM 40 MG/0.4ML ~~LOC~~ SOLN
40.0000 mg | SUBCUTANEOUS | Status: DC
  Administered 2015-02-21: 40 mg via SUBCUTANEOUS
  Filled 2015-02-21 (×2): qty 0.4

## 2015-02-21 MED ORDER — LEVOTHYROXINE SODIUM 88 MCG PO TABS
88.0000 ug | ORAL_TABLET | Freq: Every day | ORAL | Status: DC
  Administered 2015-02-22: 88 ug via ORAL
  Filled 2015-02-21 (×2): qty 1

## 2015-02-21 MED ORDER — TIMOLOL MALEATE 0.5 % OP SOLN
1.0000 [drp] | Freq: Two times a day (BID) | OPHTHALMIC | Status: DC
  Administered 2015-02-21 – 2015-02-22 (×2): 1 [drp] via OPHTHALMIC
  Filled 2015-02-21: qty 5

## 2015-02-21 MED ORDER — ONDANSETRON HCL 4 MG/2ML IJ SOLN: 4.0000 mg | Freq: Four times a day (QID) | INTRAMUSCULAR | Status: DC | PRN

## 2015-02-21 MED ORDER — ACETAMINOPHEN 650 MG RE SUPP: 650.0000 mg | Freq: Four times a day (QID) | RECTAL | Status: DC | PRN

## 2015-02-21 MED ORDER — BRIMONIDINE TARTRATE 0.2 % OP SOLN
1.0000 [drp] | Freq: Two times a day (BID) | OPHTHALMIC | Status: DC
  Administered 2015-02-21 – 2015-02-22 (×2): 1 [drp] via OPHTHALMIC
  Filled 2015-02-21: qty 5

## 2015-02-21 MED ORDER — SODIUM CHLORIDE 0.9 % IV BOLUS (SEPSIS): 500.0000 mL | Freq: Once | INTRAVENOUS | Status: DC

## 2015-02-21 MED ORDER — ACETAMINOPHEN 325 MG PO TABS: 650.0000 mg | ORAL_TABLET | Freq: Four times a day (QID) | ORAL | Status: DC | PRN

## 2015-02-21 MED ORDER — BRIMONIDINE TARTRATE-TIMOLOL 0.2-0.5 % OP SOLN: 1.0000 [drp] | Freq: Two times a day (BID) | OPHTHALMIC | Status: DC

## 2015-02-21 MED ORDER — LATANOPROST 0.005 % OP SOLN
1.0000 [drp] | Freq: Every day | OPHTHALMIC | Status: DC
  Administered 2015-02-21: 1 [drp] via OPHTHALMIC
  Filled 2015-02-21: qty 2.5

## 2015-02-21 NOTE — ED Provider Notes (Signed)
CSN: [id number]     Arrival date & time 02/21/15  1425 History   First MD Initiated Contact with Patient 02/21/15 1516     Chief Complaint  Patient presents with  . Loss of Consciousness     (Consider location/radiation/quality/duration/timing/severity/associated sxs/prior Treatment) HPI Comments: Patient presents after syncopal episode. She was sitting in her kitchen having some tea. She states that suddenly she passed out. She had no preceding symptoms of dizziness palpitations chest pain or shortness of breath. Her daughter who is fair said she slumped over but stayed in the chair and her eyes rolled back in her head. She was only unresponsive for a few seconds and then was oriented immediately after. There is no seizure activity. She's states that she's felt washed out all day but denies any dizziness or lightheadedness. She checked her blood pressure following this incident and her blood pressure was 82/48. She went to a local fire station where it was a little bit better.  She denies any past history of syncope. She denies any past heart problems. She does have a history of hypertension and hypothyroidism. She states she did take her medications this morning. She did not fall off the chair or have any injuries related to the syncopal event. She has no numbness or weakness to her extremities. She has no slurred speech or balance deficits.  Patient is a 81 y.o. female presenting with syncope.  Loss of Consciousness Associated symptoms: no chest pain, no diaphoresis, no dizziness, no fever, no headaches, no nausea, no shortness of breath, no vomiting and no weakness     Past Medical History  Diagnosis Date  . Hypertension   . Thyroid disease    History reviewed. No pertinent past surgical history. No family history on file. History  Substance Use Topics  . Smoking status: Never Smoker   . Smokeless tobacco: Not on file  . Alcohol Use: No   OB History    No data available      Review of Systems  Constitutional: Positive for fatigue. Negative for fever, chills and diaphoresis.  HENT: Negative for congestion, rhinorrhea and sneezing.   Eyes: Negative.   Respiratory: Negative for cough, chest tightness and shortness of breath.   Cardiovascular: Positive for syncope. Negative for chest pain and leg swelling.  Gastrointestinal: Negative for nausea, vomiting, abdominal pain, diarrhea and blood in stool.  Genitourinary: Negative for frequency, hematuria, flank pain and difficulty urinating.  Musculoskeletal: Negative for back pain and arthralgias.  Skin: Negative for rash.  Neurological: Positive for syncope. Negative for dizziness, speech difficulty, weakness, numbness and headaches.      Allergies  Review of patient's allergies indicates no known allergies.  Home Medications   Prior to Admission medications   Medication Sig Start Date End Date Taking? Authorizing Provider  amLODipine (NORVASC) 5 MG tablet Take 5 mg by mouth daily. 01/04/15  Yes Historical Provider, MD  brimonidine-timolol (COMBIGAN) 0.2-0.5 % ophthalmic solution Place 1 drop into the left eye every 12 (twelve) hours.   Yes Historical Provider, MD  Calcium Carb-Cholecalciferol (CALCIUM 600 + D PO) Take 1,200 mg by mouth at bedtime.   Yes Historical Provider, MD  latanoprost (XALATAN) 0.005 % ophthalmic solution Place 1 drop into both eyes at bedtime. 01/11/15  Yes Historical Provider, MD  levothyroxine (SYNTHROID, LEVOTHROID) 88 MCG tablet Take 88 mcg by mouth daily before breakfast.  01/01/15  Yes Historical Provider, MD  Multiple Vitamin (MULTIVITAMIN WITH MINERALS) TABS tablet Take 1 tablet by mouth daily.  Yes Historical Provider, MD  naproxen sodium (ANAPROX) 220 MG tablet Take 220 mg by mouth daily as needed (pain). ALEVE   Yes Historical Provider, MD   BP 135/66 mmHg  Pulse 68  Resp 15  SpO2 97% Physical Exam  Constitutional: She is oriented to person, place, and time. She appears  well-developed and well-nourished.  HENT:  Head: Normocephalic and atraumatic.  Eyes: Pupils are equal, round, and reactive to light.  Neck: Normal range of motion. Neck supple.  Cardiovascular: Normal rate, regular rhythm and normal heart sounds.   Pulmonary/Chest: Effort normal and breath sounds normal. No respiratory distress. She has no wheezes. She has no rales. She exhibits no tenderness.  Abdominal: Soft. Bowel sounds are normal. There is no tenderness. There is no rebound and no guarding.  Musculoskeletal: Normal range of motion. She exhibits no edema.  Lymphadenopathy:    She has no cervical adenopathy.  Neurological: She is alert and oriented to person, place, and time. She has normal strength. No cranial nerve deficit or sensory deficit. GCS eye subscore is 4. GCS verbal subscore is 5. GCS motor subscore is 6.  Finger-nose intact  Skin: Skin is warm and dry. No rash noted.  Psychiatric: She has a normal mood and affect.    ED Course  Procedures (including critical care time) Labs Review Labs Reviewed  COMPREHENSIVE METABOLIC PANEL - Abnormal; Notable for the following:    Creatinine, Ser 1.19 (*)    Albumin 3.4 (*)    ALT 13 (*)    GFR calc non Af Amer 40 (*)    GFR calc Af Amer 47 (*)    All other components within normal limits  CBC WITH DIFFERENTIAL/PLATELET  URINALYSIS, ROUTINE W REFLEX MICROSCOPIC  I-STAT TROPOININ, ED    Imaging Review Dg Chest 2 View  02/21/2015   CLINICAL DATA:  Patient with loss of consciousness.  EXAM: CHEST  2 VIEW  COMPARISON:  Chest radiograph 10/23/2013  FINDINGS: Stable cardiac and mediastinal contours. No consolidative pulmonary opacities. No pleural effusion or pneumothorax. Cholecystectomy clips. No aggressive or acute appearing osseous lesions.  IMPRESSION: No acute cardiopulmonary process.   Electronically Signed   By: Lovey Newcomer M.D.   On: 02/21/2015 15:35     EKG Interpretation   Date/Time:  Sunday Feb 21 2015 14:41:26  EDT Ventricular Rate:  65 PR Interval:  216 QRS Duration: 142 QT Interval:  424 QTC Calculation: 440 R Axis:   14 Text Interpretation:  Sinus rhythm with 1st degree A-V block Left bundle  branch block Abnormal ECG Confirmed by Unity Luepke  MD, Kamen Hanken ([id number]) on  02/21/2015 3:15:15 PM      MDM   Final diagnoses:  Syncope, unspecified syncope type    Pt presents with syncope/hypotension without preceding symptoms. This is concerning for a possible arrhythmia. Her labs are unremarkable other than a mildly elevated creatinine. Her EKG shows a left bundle branch block which is seen on prior EKGs however her PR interval has increased. Her troponin is negative. I will consult the hospitalist for admission for further evaluation.    Malvin Johns, MD 02/21/15 [phone]

## 2015-02-21 NOTE — H&P (Signed)
[facility] History and Physical  Mackenzie Madden NKN:[id number] DOB: Sep 17, 1928 DOA: 02/21/2015   PCP: Irven Shelling, MD    Chief Complaint: Passed out  HPI: Mackenzie Madden is a 81 y.o. female with a past medical history of hypertension and hypothyroidism who was eating her meal this afternoon when she became unresponsive at the table for a matter of a few seconds. She did not hit the table or the floor. She did not have any dizziness or chest pain, papitations or neurological symptoms prior to or after the episode. She was very oriented when she awoke. There was no loss of bowel or bladder control and tongue biting. Apparently the patient has a blood pressure monitor and when she checked her blood pressure after the episode it was 82/48. She went to a local fire department where her systolic blood pressure was found to be in the 90s. By the time she arrived to the ER her blood pressure was 379 systolic and has since been rising. Most recent blood pressure was 150/59. No bradycardia or tachycardia at any point.   General: The patient denies anorexia, fever, weight loss Cardiac: Denies chest pain, syncope, palpitations, pedal edema  Respiratory: Denies cough, shortness of breath, wheezing GI: Denies severe indigestion/heartburn, abdominal pain, nausea, vomiting, diarrhea and constipation GU: Denies hematuria, incontinence, dysuria  Musculoskeletal: Denies arthritis  Skin: Denies suspicious skin lesions Neurologic: Denies focal weakness or numbness, change in vision Psychiatry: Denies depression or anxiety. Hematologic: no easy bruising or bleeding  All other systems reviewed and found to be negative.  Past Medical History  Diagnosis Date  . Hypertension   .       PSH: hysterectomy, cholecystectomy, surgery for basal cell skin cancer on face   History reviewed. No pertinent past surgical history.  Social History: does not smoker or drink alcohol Lives at home  with family    No Known Allergies  Family history:  Mother- MI at age 11   Prior to Admission medications   Medication Sig Start Date End Date Taking? Authorizing Provider  amLODipine (NORVASC) 5 MG tablet Take 5 mg by mouth daily. 01/04/15  Yes Historical Provider, MD  brimonidine-timolol (COMBIGAN) 0.2-0.5 % ophthalmic solution Place 1 drop into the left eye every 12 (twelve) hours.   Yes Historical Provider, MD  Calcium Carb-Cholecalciferol (CALCIUM 600 + D PO) Take 1,200 mg by mouth at bedtime.   Yes Historical Provider, MD  latanoprost (XALATAN) 0.005 % ophthalmic solution Place 1 drop into both eyes at bedtime. 01/11/15  Yes Historical Provider, MD  levothyroxine (SYNTHROID, LEVOTHROID) 88 MCG tablet Take 88 mcg by mouth daily before breakfast.  01/01/15  Yes Historical Provider, MD  Multiple Vitamin (MULTIVITAMIN WITH MINERALS) TABS tablet Take 1 tablet by mouth daily.   Yes Historical Provider, MD  naproxen sodium (ANAPROX) 220 MG tablet Take 220 mg by mouth daily as needed (pain). ALEVE   Yes Historical Provider, MD     Physical Exam: Filed Vitals:   02/21/15 1615 02/21/15 1700 02/21/15 1715 02/21/15 1730  BP: 124/55 137/69 136/64 150/59  Pulse: 59 66 66 73  Resp: 16 16 15 17   SpO2: 98% 99% 88% 98%     General: AAO x3, no acute distress HEENT: Normocephalic and Atraumatic, skin changes on forehead and nose consistent with prior surgery and skin graft, Mucous membranes pink                PERRLA; EOM intact; No scleral icterus,  Nares: Patent, Oropharynx: Clear, Fair Dentition                 Neck: FROM, no cervical lymphadenopathy, thyromegaly, carotid bruit or JVD;  Breasts: deferred CHEST WALL: No tenderness  CHEST: Normal respiration, clear to auscultation bilaterally  HEART: Regular rate and rhythm; 2/6 murmur at apex, no rubs or gallops  BACK: No kyphosis or scoliosis; no CVA tenderness  GI: Positive Bowel Sounds, soft, non-tender; no masses, no  organomegaly Rectal Exam: deferred MSK: No cyanosis, clubbing, or edema Genitalia: not examined  SKIN:  no rash or ulceration  CNS: Alert and Oriented x 4, Nonfocal exam, CN 2-12 intact  Labs on Admission:  Basic Metabolic Panel:  Recent Labs Lab 02/21/15 1449  NA 140  K 4.2  CL 105  CO2 29  GLUCOSE 93  BUN 18  CREATININE 1.19*  CALCIUM 9.6   Liver Function Tests:  Recent Labs Lab 02/21/15 1449  AST 22  ALT 13*  ALKPHOS 62  BILITOT 0.5  PROT 7.1  ALBUMIN 3.4*   No results for input(s): LIPASE, AMYLASE in the last 168 hours. No results for input(s): AMMONIA in the last 168 hours. CBC:  Recent Labs Lab 02/21/15 1449  WBC 8.8  NEUTROABS 5.7  HGB 13.5  HCT 41.5  MCV 95.2  PLT 192   Cardiac Enzymes: No results for input(s): CKTOTAL, CKMB, CKMBINDEX, TROPONINI in the last 168 hours.  BNP (last 3 results) No results for input(s): BNP in the last 8760 hours.  ProBNP (last 3 results) No results for input(s): PROBNP in the last 8760 hours.  CBG: No results for input(s): GLUCAP in the last 168 hours.  Radiological Exams on Admission: Dg Chest 2 View  02/21/2015   CLINICAL DATA:  Patient with loss of consciousness.  EXAM: CHEST  2 VIEW  COMPARISON:  Chest radiograph 10/23/2013  FINDINGS: Stable cardiac and mediastinal contours. No consolidative pulmonary opacities. No pleural effusion or pneumothorax. Cholecystectomy clips. No aggressive or acute appearing osseous lesions.  IMPRESSION: No acute cardiopulmonary process.   Electronically Signed   By: Lovey Newcomer M.D.   On: 02/21/2015 15:35    EKG: Independently reviewed. Sinus rhythm, 1st degree AVB, RBBB  Assessment/Plan Principal Problem:   Syncope -Hypotension noted right after the episode but resolved by the time she arrived to the ER therefore this may have been vasovagal-orthostatic vitals are negative - CT scan of the head negative-no alarming cardiac or neurology symptoms per history or on exam - EKG  showed first-degree AV block with a left bundle-branch block- old EKG to compare with -We'll obtain a 2-D echo, monitor on telemetry and reassess tomorrow  Active Problems: Renal failure -Old labs from 2010 revealed normal renal function but unable to tell if she has developed chronic kidney disease over time or whether this is acute renal failure - She is received a normal saline bolus of 500 mL in the ER -We will slowly hydrate overnight with half-normal saline (to prevent hypertension) and recheck renal function tomorrow morning    HTN (hypertension) -Resume amlodipine    Hypothyroid -Check TSH and free T4    Consulted:   Code Status: Full code Family Communication: Family at bedside  DVT Prophylaxis: Lovenox  Time spent: 76 min  [place], MD [facility]  If 7PM-7AM, please contact night-coverage [email or web address] 02/21/2015, 5:46 PM

## 2015-02-21 NOTE — ED Notes (Signed)
Attempted report Xs 1.  

## 2015-02-21 NOTE — ED Notes (Signed)
Patient transported to X-ray 

## 2015-02-21 NOTE — ED Notes (Addendum)
Pt presents to department for evaluation of syncope. Reports she passed out while eating lunch today. Family witnessed event, said she was difficult to arouse for several seconds. BP check by fire, initial 82/48. Pt denies pain upon arrival. Respirations unlabored. She is alert and oriented x4. BBB noted on EKG by fire.

## 2015-02-22 ENCOUNTER — Inpatient Hospital Stay (HOSPITAL_COMMUNITY): Payer: Medicare Other

## 2015-02-22 DIAGNOSIS — R55 Syncope and collapse: Secondary | ICD-10-CM | POA: Diagnosis not present

## 2015-02-22 DIAGNOSIS — I10 Essential (primary) hypertension: Secondary | ICD-10-CM

## 2015-02-22 DIAGNOSIS — E039 Hypothyroidism, unspecified: Secondary | ICD-10-CM | POA: Diagnosis not present

## 2015-02-22 DIAGNOSIS — I5032 Chronic diastolic (congestive) heart failure: Secondary | ICD-10-CM | POA: Diagnosis not present

## 2015-02-22 DIAGNOSIS — H40053 Ocular hypertension, bilateral: Secondary | ICD-10-CM

## 2015-02-22 DIAGNOSIS — H40059 Ocular hypertension, unspecified eye: Secondary | ICD-10-CM | POA: Insufficient documentation

## 2015-02-22 LAB — BASIC METABOLIC PANEL
Anion gap: 7 (ref 5–15)
BUN: 13 mg/dL (ref 6–20)
CO2: 25 mmol/L (ref 22–32)
Calcium: 8.5 mg/dL — ABNORMAL LOW (ref 8.9–10.3)
Chloride: 106 mmol/L (ref 101–111)
Creatinine, Ser: 0.87 mg/dL (ref 0.44–1.00)
GFR calc non Af Amer: 59 mL/min — ABNORMAL LOW (ref >60)
GLUCOSE: 93 mg/dL (ref 65–99)
Potassium: 3.7 mmol/L (ref 3.5–5.1)
SODIUM: 138 mmol/L (ref 135–145)

## 2015-02-22 LAB — CBC
HEMATOCRIT: 38.2 % (ref 36.0–46.0)
HEMOGLOBIN: 12.7 g/dL (ref 12.0–15.0)
MCH: 31.2 pg (ref 26.0–34.0)
MCHC: 33.2 g/dL (ref 30.0–36.0)
MCV: 93.9 fL (ref 78.0–100.0)
PLATELETS: 167 10*3/uL (ref 150–400)
RBC: 4.07 MIL/uL (ref 3.87–5.11)
RDW: 13.8 % (ref 11.5–15.5)
WBC: 9.1 10*3/uL (ref 4.0–10.5)

## 2015-02-22 MED ORDER — DEXTROSE-NACL 5-0.45 % IV SOLN
INTRAVENOUS | Status: DC
  Administered 2015-02-22: 08:00:00 via INTRAVENOUS

## 2015-02-22 MED ORDER — AMLODIPINE BESYLATE 5 MG PO TABS: 2.5000 mg | ORAL_TABLET | Freq: Every day | ORAL | Status: DC

## 2015-02-22 NOTE — Discharge Summary (Signed)
Physician Discharge Summary  Mackenzie Madden PPJ:[id number] DOB: [birth year] DOA: 02/21/2015  PCP: Irven Shelling, MD  Admit date: 02/21/2015 Discharge date: 02/22/2015  Time spent: 30 minutes  Recommendations for Outpatient Follow-up:  Repeat BMET to follow electrolytes and renal function Reassess BP and adjust medications as needed   Discharge Diagnoses:  Principal Problem:   Syncope Active Problems:   HTN (hypertension)   Hypothyroid increase bilateral intraocular pressure Grade 1 chronic diastolic heart failure Acute kidney injury   Discharge Condition: stable and improved. Will discharge back home with family care. Follow up with PCP in 2 weeks  Diet recommendation: heart healthy diet  Filed Weights   02/21/15 1809  Weight: 50.077 kg (110 lb 6.4 oz)    History of present illness:  81 y.o. female with a past medical history of hypertension and hypothyroidism who was eating her meal this afternoon when she became unresponsive at the table for a matter of a few seconds. She did not hit the table or the floor. She did not have any dizziness or chest pain, papitations or neurological symptoms prior to or after the episode. She was very oriented when she awoke. There was no loss of bowel or bladder control and tongue biting. Apparently the patient has a blood pressure monitor and when she checked her blood pressure after the episode it was 82/48. She went to a local fire department where her systolic blood pressure was found to be in the 90s. By the time she arrived to the ER her blood pressure was 580 systolic and has since been rising. Most recent blood pressure was 150/59. No bradycardia or tachycardia appreciated at any point.  Hospital Course:  Syncope -Hypotension noted right after the episode but resolved by the time she arrived to the ER therefore this may have been vasovagal vs orthostatic changes associated  - CT scan of the head negative-no alarming cardiac or  neurology symptoms on exam - EKG showed first-degree AV block with a left bundle-branch block; according to family members (they were aware of these changes -2-D echo: with mild aortic stenosis and grade 1 diastolic heart failure, no abnormalities on telemetry. -if recurred will recommend loop recorder vs event monitoring  Acute kidney injury -per her GFR will suspect CKD stage 2 at baseline -responded fantastic to IVF's -Cr back to normal at discharge -advise to maintain adequate hydration -etiology, most likely decrease perfusion with hypotensive episode   HTN (hypertension): with episode of what appears to be orthostatic changes -will resume amlodipine but will use 2.5mg  instead -advise to follow low sodium diet  Chronic diastolic heart failure: no wall motion abnormalities, neg troponin and preserved EF -patient w/o SOB -advise to watch weight and to follow low sodium diet -no needs for diuretics currently  Hypothyroid -TSH WNL -patient asymptomatic -will continue synthroid   increase bilateral intraocular pressure Continue home eye drops regimen Actively follow by ophthalmologist   Procedures:  2-D echo: - Left ventricle: The cavity size was normal. Systolic function was normal. The estimated ejection fraction was in the range of 60% to 65%. Wall motion was normal; there were no regional wall motion abnormalities. Doppler parameters are consistent with abnormal left ventricular relaxation (grade 1 diastolic dysfunction). - Aortic valve: Possibly unicuspid; mildly thickened, mildly calcified leaflets. There was mild stenosis. There was mild regurgitation. Mean gradient (S): 10 mm Hg. Peak gradient (S): 20 mm Hg. Valve area (VTI): 1.72 cm^2. Valve area (Vmax): 1.6 cm^2. Valve area (Vmean): 1.67 cm^2. - Aortic root:  The aortic root was normal in size. - Mitral valve: Calcified annulus. Mildly thickened leaflets . There was mild regurgitation. -  Left atrium: The atrium was normal in size. - Right ventricle: Systolic function was normal. - Right atrium: The atrium was normal in size. - Tricuspid valve: There was mild regurgitation. - Pulmonic valve: Structurally normal valve. - Pulmonary arteries: Systolic pressure was within the normal range. PA peak pressure: 32 mm Hg (S). - Inferior vena cava: The vessel was normal in size. - Pericardium, extracardiac: There was no pericardial effusion.  Consultations:  None   Discharge Exam: Filed Vitals:   02/22/15 1052  BP: 142/50  Pulse:   Temp:   Resp:     General: afebrile, no CP, no SOB. Reports no further dizziness and is currently asymptomatic and asking for discharge Cardiovascular: S1 and S2, no rubs or gallops; positive SEM Respiratory: no edema, no cyanosis   Discharge Instructions   Discharge Instructions    Diet - low sodium heart healthy    Complete by:  As directed      Discharge instructions    Complete by:  As directed   Take medications as prescribed Maintain adequate hydration Follow heart healthy diet (especially watch sodium intake, no more than 2.5 gram daily) Arrange follow up with PCP in 2 weeks          Current Discharge Medication List    CONTINUE these medications which have CHANGED   Details  amLODipine (NORVASC) 5 MG tablet Take 0.5 tablets (2.5 mg total) by mouth daily.      CONTINUE these medications which have NOT CHANGED   Details  brimonidine-timolol (COMBIGAN) 0.2-0.5 % ophthalmic solution Place 1 drop into the left eye every 12 (twelve) hours.    Calcium Carb-Cholecalciferol (CALCIUM 600 + D PO) Take 1,200 mg by mouth at bedtime.    latanoprost (XALATAN) 0.005 % ophthalmic solution Place 1 drop into both eyes at bedtime. Refills: 11    levothyroxine (SYNTHROID, LEVOTHROID) 88 MCG tablet Take 88 mcg by mouth daily before breakfast.  Refills: 3    Multiple Vitamin (MULTIVITAMIN WITH MINERALS) TABS tablet Take 1 tablet by  mouth daily.    naproxen sodium (ANAPROX) 220 MG tablet Take 220 mg by mouth daily as needed (pain). ALEVE       No Known Allergies Follow-up Information    Follow up with Irven Shelling, MD. Schedule an appointment as soon as possible for a visit in 2 weeks.   Specialty:  Internal Medicine   Contact information:   [address] [place] [address] [phone]       The results of significant diagnostics from this hospitalization (including imaging, microbiology, ancillary and laboratory) are listed below for reference.    Significant Diagnostic Studies: Dg Chest 2 View  02/21/2015   CLINICAL DATA:  Patient with loss of consciousness.  EXAM: CHEST  2 VIEW  COMPARISON:  Chest radiograph 10/23/2013  FINDINGS: Stable cardiac and mediastinal contours. No consolidative pulmonary opacities. No pleural effusion or pneumothorax. Cholecystectomy clips. No aggressive or acute appearing osseous lesions.  IMPRESSION: No acute cardiopulmonary process.   Electronically Signed   By: Lovey Newcomer M.D.   On: 02/21/2015 15:35   Labs: Basic Metabolic Panel:  Recent Labs Lab 02/21/15 1449 02/22/15 0453  NA 140 138  K 4.2 3.7  CL 105 106  CO2 29 25  GLUCOSE 93 93  BUN 18 13  CREATININE 1.19* 0.87  CALCIUM 9.6 8.5*  Liver Function Tests:  Recent Labs Lab 02/21/15 1449  AST 22  ALT 13*  ALKPHOS 62  BILITOT 0.5  PROT 7.1  ALBUMIN 3.4*   CBC:  Recent Labs Lab 02/21/15 1449 02/22/15 0453  WBC 8.8 9.1  NEUTROABS 5.7  --   HGB 13.5 12.7  HCT 41.5 38.2  MCV 95.2 93.9  PLT 192 167    Signed:  Barton Dubois  [facility] 02/22/2015, 4:06 PM

## 2015-02-22 NOTE — Progress Notes (Signed)
  Echocardiogram 2D Echocardiogram has been performed.  Mackenzie Madden FRANCES 02/22/2015, 9:59 AM

## 2015-02-22 NOTE — Progress Notes (Signed)
02/22/2015 5:23 PM Discharge AVS meds taken today and those due this evening reviewed.  Follow-up appointments and when to call md reviewed.  D/C IV and TELE.  Questions and concerns addressed.   D/C home per orders. [place]

## 2015-02-23 NOTE — Progress Notes (Signed)
Retro ur review

## 2015-03-08 DIAGNOSIS — Z1389 Encounter for screening for other disorder: Secondary | ICD-10-CM | POA: Diagnosis not present

## 2015-03-08 DIAGNOSIS — R55 Syncope and collapse: Secondary | ICD-10-CM | POA: Diagnosis not present

## 2015-03-08 DIAGNOSIS — I10 Essential (primary) hypertension: Secondary | ICD-10-CM | POA: Diagnosis not present

## 2015-03-17 DIAGNOSIS — L570 Actinic keratosis: Secondary | ICD-10-CM | POA: Diagnosis not present

## 2015-03-17 DIAGNOSIS — D485 Neoplasm of uncertain behavior of skin: Secondary | ICD-10-CM | POA: Diagnosis not present

## 2015-03-17 DIAGNOSIS — C44319 Basal cell carcinoma of skin of other parts of face: Secondary | ICD-10-CM | POA: Diagnosis not present

## 2015-04-07 DIAGNOSIS — R636 Underweight: Secondary | ICD-10-CM | POA: Diagnosis not present

## 2015-04-07 DIAGNOSIS — I10 Essential (primary) hypertension: Secondary | ICD-10-CM | POA: Diagnosis not present

## 2015-05-10 DIAGNOSIS — H5711 Ocular pain, right eye: Secondary | ICD-10-CM | POA: Diagnosis not present

## 2015-05-10 DIAGNOSIS — D485 Neoplasm of uncertain behavior of skin: Secondary | ICD-10-CM | POA: Diagnosis not present

## 2015-06-02 DIAGNOSIS — H5711 Ocular pain, right eye: Secondary | ICD-10-CM | POA: Diagnosis not present

## 2015-06-09 DIAGNOSIS — H4011X2 Primary open-angle glaucoma, moderate stage: Secondary | ICD-10-CM | POA: Diagnosis not present

## 2015-07-06 DIAGNOSIS — Z23 Encounter for immunization: Secondary | ICD-10-CM | POA: Diagnosis not present

## 2016-01-17 DIAGNOSIS — L821 Other seborrheic keratosis: Secondary | ICD-10-CM | POA: Diagnosis not present

## 2016-01-17 DIAGNOSIS — D235 Other benign neoplasm of skin of trunk: Secondary | ICD-10-CM | POA: Diagnosis not present

## 2016-01-17 DIAGNOSIS — L218 Other seborrheic dermatitis: Secondary | ICD-10-CM | POA: Diagnosis not present

## 2016-01-17 DIAGNOSIS — L812 Freckles: Secondary | ICD-10-CM | POA: Diagnosis not present

## 2016-06-04 ENCOUNTER — Inpatient Hospital Stay (HOSPITAL_COMMUNITY)
Admission: EM | Admit: 2016-06-04 | Discharge: 2016-06-07 | DRG: 313 | Disposition: A | Discharge status: Home / Self Care | Payer: Medicare Other | Attending: Internal Medicine | Admitting: Internal Medicine

## 2016-06-04 ENCOUNTER — Encounter (HOSPITAL_COMMUNITY): Payer: Self-pay

## 2016-06-04 ENCOUNTER — Emergency Department (HOSPITAL_COMMUNITY): Payer: Medicare Other

## 2016-06-04 DIAGNOSIS — I447 Left bundle-branch block, unspecified: Secondary | ICD-10-CM | POA: Diagnosis present

## 2016-06-04 DIAGNOSIS — R0789 Other chest pain: Secondary | ICD-10-CM | POA: Diagnosis not present

## 2016-06-04 DIAGNOSIS — R748 Abnormal levels of other serum enzymes: Secondary | ICD-10-CM

## 2016-06-04 DIAGNOSIS — Z7982 Long term (current) use of aspirin: Secondary | ICD-10-CM

## 2016-06-04 DIAGNOSIS — Z8249 Family history of ischemic heart disease and other diseases of the circulatory system: Secondary | ICD-10-CM

## 2016-06-04 DIAGNOSIS — E039 Hypothyroidism, unspecified: Secondary | ICD-10-CM | POA: Diagnosis present

## 2016-06-04 DIAGNOSIS — I10 Essential (primary) hypertension: Secondary | ICD-10-CM | POA: Diagnosis present

## 2016-06-04 DIAGNOSIS — J439 Emphysema, unspecified: Secondary | ICD-10-CM | POA: Diagnosis not present

## 2016-06-04 DIAGNOSIS — Z791 Long term (current) use of non-steroidal anti-inflammatories (NSAID): Secondary | ICD-10-CM

## 2016-06-04 DIAGNOSIS — B9620 Unspecified Escherichia coli [E. coli] as the cause of diseases classified elsewhere: Secondary | ICD-10-CM | POA: Diagnosis present

## 2016-06-04 DIAGNOSIS — K839 Disease of biliary tract, unspecified: Secondary | ICD-10-CM

## 2016-06-04 DIAGNOSIS — Z79899 Other long term (current) drug therapy: Secondary | ICD-10-CM

## 2016-06-04 DIAGNOSIS — R740 Nonspecific elevation of levels of transaminase and lactic acid dehydrogenase [LDH]: Secondary | ICD-10-CM | POA: Diagnosis present

## 2016-06-04 DIAGNOSIS — R079 Chest pain, unspecified: Secondary | ICD-10-CM | POA: Diagnosis present

## 2016-06-04 DIAGNOSIS — Z9049 Acquired absence of other specified parts of digestive tract: Secondary | ICD-10-CM

## 2016-06-04 DIAGNOSIS — N390 Urinary tract infection, site not specified: Secondary | ICD-10-CM | POA: Diagnosis present

## 2016-06-04 LAB — I-STAT TROPONIN, ED: TROPONIN I, POC: 0 ng/mL (ref 0.00–0.08)

## 2016-06-04 LAB — URINALYSIS, ROUTINE W REFLEX MICROSCOPIC
BILIRUBIN URINE: NEGATIVE
Glucose, UA: NEGATIVE mg/dL
KETONES UR: NEGATIVE mg/dL
NITRITE: NEGATIVE
PH: 7.5 (ref 5.0–8.0)
Protein, ur: NEGATIVE mg/dL
Specific Gravity, Urine: 1.01 (ref 1.005–1.030)

## 2016-06-04 LAB — BASIC METABOLIC PANEL
ANION GAP: 4 — AB (ref 5–15)
BUN: 11 mg/dL (ref 6–20)
CO2: 27 mmol/L (ref 22–32)
Calcium: 9.3 mg/dL (ref 8.9–10.3)
Chloride: 104 mmol/L (ref 101–111)
Creatinine, Ser: 0.75 mg/dL (ref 0.44–1.00)
Glucose, Bld: 94 mg/dL (ref 65–99)
Potassium: 3.8 mmol/L (ref 3.5–5.1)
Sodium: 135 mmol/L (ref 135–145)

## 2016-06-04 LAB — CBC
HCT: 45.4 % (ref 36.0–46.0)
HEMOGLOBIN: 14.7 g/dL (ref 12.0–15.0)
MCH: 31.1 pg (ref 26.0–34.0)
MCHC: 32.4 g/dL (ref 30.0–36.0)
MCV: 96.2 fL (ref 78.0–100.0)
Platelets: 195 10*3/uL (ref 150–400)
RBC: 4.72 MIL/uL (ref 3.87–5.11)
RDW: 13.8 % (ref 11.5–15.5)
WBC: 12.9 10*3/uL — ABNORMAL HIGH (ref 4.0–10.5)

## 2016-06-04 LAB — TROPONIN I

## 2016-06-04 LAB — URINE MICROSCOPIC-ADD ON: SQUAMOUS EPITHELIAL / LPF: NONE SEEN

## 2016-06-04 LAB — TSH: TSH: 0.788 u[IU]/mL (ref 0.350–4.500)

## 2016-06-04 MED ORDER — METOPROLOL TARTRATE 25 MG PO TABS
25.0000 mg | ORAL_TABLET | Freq: Two times a day (BID) | ORAL | Status: DC
  Administered 2016-06-04 – 2016-06-07 (×5): 25 mg via ORAL
  Filled 2016-06-04 (×6): qty 1

## 2016-06-04 MED ORDER — NITROGLYCERIN 2 % TD OINT
1.0000 [in_us] | TOPICAL_OINTMENT | Freq: Once | TRANSDERMAL | Status: AC
  Administered 2016-06-04: 1 [in_us] via TOPICAL
  Filled 2016-06-04: qty 1

## 2016-06-04 MED ORDER — LATANOPROST 0.005 % OP SOLN
1.0000 [drp] | Freq: Every day | OPHTHALMIC | Status: DC
  Administered 2016-06-04 – 2016-06-06 (×3): 1 [drp] via OPHTHALMIC
  Filled 2016-06-04: qty 2.5

## 2016-06-04 MED ORDER — SENNA 8.6 MG PO TABS
1.0000 | ORAL_TABLET | Freq: Two times a day (BID) | ORAL | Status: DC
  Administered 2016-06-04 – 2016-06-06 (×3): 8.6 mg via ORAL
  Filled 2016-06-04 (×4): qty 1

## 2016-06-04 MED ORDER — ALBUTEROL SULFATE (2.5 MG/3ML) 0.083% IN NEBU: 2.5000 mg | INHALATION_SOLUTION | RESPIRATORY_TRACT | Status: DC | PRN

## 2016-06-04 MED ORDER — TRAZODONE HCL 50 MG PO TABS: 50.0000 mg | ORAL_TABLET | Freq: Every evening | ORAL | Status: DC | PRN

## 2016-06-04 MED ORDER — CALCIUM CARBONATE-VITAMIN D 600-400 MG-UNIT PO TABS: ORAL_TABLET | Freq: Every day | ORAL | Status: DC

## 2016-06-04 MED ORDER — ONDANSETRON HCL 4 MG/2ML IJ SOLN
4.0000 mg | Freq: Four times a day (QID) | INTRAMUSCULAR | Status: DC | PRN
  Administered 2016-06-05 (×2): 4 mg via INTRAVENOUS
  Filled 2016-06-04: qty 2

## 2016-06-04 MED ORDER — TIMOLOL MALEATE 0.5 % OP SOLN
1.0000 [drp] | Freq: Two times a day (BID) | OPHTHALMIC | Status: DC
  Administered 2016-06-04 – 2016-06-06 (×5): 1 [drp] via OPHTHALMIC
  Filled 2016-06-04: qty 5

## 2016-06-04 MED ORDER — SODIUM CHLORIDE 0.9% FLUSH
3.0000 mL | Freq: Two times a day (BID) | INTRAVENOUS | Status: DC
  Administered 2016-06-04 – 2016-06-05 (×3): 3 mL via INTRAVENOUS

## 2016-06-04 MED ORDER — SODIUM CHLORIDE 0.9 % IV SOLN: 250.0000 mL | INTRAVENOUS | Status: DC | PRN

## 2016-06-04 MED ORDER — ACETAMINOPHEN 325 MG PO TABS
650.0000 mg | ORAL_TABLET | Freq: Four times a day (QID) | ORAL | Status: DC | PRN
Start: 2016-06-04 — End: 2016-06-07

## 2016-06-04 MED ORDER — BRIMONIDINE TARTRATE 0.2 % OP SOLN
1.0000 [drp] | Freq: Two times a day (BID) | OPHTHALMIC | Status: DC
  Administered 2016-06-04 – 2016-06-06 (×5): 1 [drp] via OPHTHALMIC
  Filled 2016-06-04: qty 5

## 2016-06-04 MED ORDER — CALCIUM CARBONATE-VITAMIN D 500-200 MG-UNIT PO TABS
2.0000 | ORAL_TABLET | Freq: Every day | ORAL | Status: DC
  Administered 2016-06-04 – 2016-06-07 (×4): 2 via ORAL
  Filled 2016-06-04 (×3): qty 1
  Filled 2016-06-04: qty 2

## 2016-06-04 MED ORDER — POLYETHYLENE GLYCOL 3350 17 G PO PACK: 17.0000 g | PACK | Freq: Every day | ORAL | Status: DC | PRN

## 2016-06-04 MED ORDER — ASPIRIN 325 MG PO TABS: 325.0000 mg | ORAL_TABLET | Freq: Four times a day (QID) | ORAL | Status: DC | PRN

## 2016-06-04 MED ORDER — SODIUM CHLORIDE 0.9% FLUSH: 3.0000 mL | INTRAVENOUS | Status: DC | PRN

## 2016-06-04 MED ORDER — ADULT MULTIVITAMIN W/MINERALS CH
1.0000 | ORAL_TABLET | Freq: Every day | ORAL | Status: DC
  Administered 2016-06-05 – 2016-06-07 (×3): 1 via ORAL
  Filled 2016-06-04 (×3): qty 1

## 2016-06-04 MED ORDER — OXYCODONE HCL 5 MG PO TABS: 5.0000 mg | ORAL_TABLET | ORAL | Status: DC | PRN

## 2016-06-04 MED ORDER — NITROGLYCERIN 2 % TD OINT
1.0000 [in_us] | TOPICAL_OINTMENT | Freq: Three times a day (TID) | TRANSDERMAL | Status: DC
  Administered 2016-06-04: 1 [in_us] via TOPICAL
  Filled 2016-06-04: qty 30

## 2016-06-04 MED ORDER — ACETAMINOPHEN 650 MG RE SUPP
650.0000 mg | Freq: Four times a day (QID) | RECTAL | Status: DC | PRN
Start: 2016-06-04 — End: 2016-06-07

## 2016-06-04 MED ORDER — ONDANSETRON HCL 4 MG PO TABS
4.0000 mg | ORAL_TABLET | Freq: Four times a day (QID) | ORAL | Status: DC | PRN
Start: 2016-06-04 — End: 2016-06-07

## 2016-06-04 MED ORDER — AMLODIPINE BESYLATE 5 MG PO TABS
5.0000 mg | ORAL_TABLET | Freq: Every day | ORAL | Status: DC
  Administered 2016-06-04 – 2016-06-07 (×4): 5 mg via ORAL
  Filled 2016-06-04 (×4): qty 1

## 2016-06-04 MED ORDER — BRIMONIDINE TARTRATE-TIMOLOL 0.2-0.5 % OP SOLN: 1.0000 [drp] | Freq: Two times a day (BID) | OPHTHALMIC | Status: DC

## 2016-06-04 MED ORDER — LEVOTHYROXINE SODIUM 100 MCG PO TABS
100.0000 ug | ORAL_TABLET | Freq: Every day | ORAL | Status: DC
  Administered 2016-06-05 – 2016-06-07 (×3): 100 ug via ORAL
  Filled 2016-06-04 (×3): qty 1

## 2016-06-04 MED ORDER — HEPARIN SODIUM (PORCINE) 5000 UNIT/ML IJ SOLN
5000.0000 [IU] | Freq: Three times a day (TID) | INTRAMUSCULAR | Status: DC
  Administered 2016-06-04 – 2016-06-06 (×5): 5000 [IU] via SUBCUTANEOUS
  Filled 2016-06-04 (×4): qty 1

## 2016-06-04 MED ORDER — SODIUM CHLORIDE 0.9% FLUSH: 3.0000 mL | Freq: Two times a day (BID) | INTRAVENOUS | Status: DC

## 2016-06-04 NOTE — H&P (Signed)
Patient Demographics:    Mackenzie Madden, is a 82 y.o. female  MRN: [id number]   DOB - 17-Jul-1928  Admit Date - 06/04/2016  Outpatient Primary MD for the patient is Irven Shelling, MD   Assessment & Plan:    Active Problems:   Atypical chest pain   Chest pain    1)Atypical Chest Pain-  Cardiovascular risk factors include Status post menopause, age, and hypertension,   Refer to  Observation status on  telemetry monitored unit, check serial troponins and EKG for rule out acute coronary syndrome .  If patient rules out for ACS, we will consider cardiology consult and/or stress in am , if stress test if positive then please get Cardiology consult.  Give aspirin, Nitropaste, and metoprolol, EKG with left bundle branch block, but this is not new  2)HTN-stage II hypertension, restart amlodipine 5 mg daily, add metoprolol 25 mg by mouth twice a day as above in 1,   may use IV Hydralazine 10 mg  Every 4 hours Prn for systolic blood pressure over 160 mmhg  3)Leukocytosis- patient does not look ill, no fevers, no other evidence of infection, may be reactive leukocytosis, hold off on antibiotics at this time. Chest x-ray without pneumonia, UA is pending  4)Hypothyroidism- restart levothyroxine, repeat TSH pending  With History of - Reviewed by me  Past Medical History:  Diagnosis Date  . Hypertension   . Thyroid disease       History reviewed. No pertinent surgical history.    Chief Complaint  Patient presents with  . Chest Pain      HPI:    Mackenzie Madden  is a 82 y.o. female,With past medical history relevant for hypertension and hypothyroidism, otherwise healthy now presents With intermittent episodes of exertional retrosternal left-sided chest discomfort started this morning. Chest  discomfort was associated with some nausea but no emesis, no diaphoresis, no dizziness and palpitations no significant shortness of breath no pleuritic symptoms no leg pains or leg swelling. Husband is at bedside, questions answered. She was apparently getting around in the house trying to get ready for church when intermittent chest pains started with activity. It was relieved by nitroglycerin and rest.  she took aspirin a home prior to arrival to the ED. No fever chills or productive cough    Review of systems:    In addition to the HPI above,   A full 12 point Review of Systems was done, except as stated above, all other Review of Systems were negative.    Social History:  Reviewed by me    Social History  Substance Use Topics  . Smoking status: Never Smoker  . Smokeless tobacco: Never Used  . Alcohol use No       Family History :  Reviewed by me   Family history negative for premature CAD in first-degree relatives   Home Medications:   Prior to Admission medications  Medication Sig Start Date End Date Taking? Authorizing Provider  amLODipine (NORVASC) 5 MG tablet Take 0.5 tablets (2.5 mg total) by mouth daily. Patient taking differently: Take 5 mg by mouth daily.  02/22/15  Yes Barton Dubois, MD  aspirin 325 MG tablet Take 325 mg by mouth every 6 (six) hours as needed for mild pain.   Yes Historical Provider, MD  brimonidine-timolol (COMBIGAN) 0.2-0.5 % ophthalmic solution Place 1 drop into the left eye every 12 (twelve) hours.   Yes Historical Provider, MD  Calcium Carb-Cholecalciferol (CALCIUM 600 + D PO) Take 1,200 mg by mouth at bedtime.   Yes Historical Provider, MD  latanoprost (XALATAN) 0.005 % ophthalmic solution Place 1 drop into both eyes at bedtime. 01/11/15  Yes Historical Provider, MD  levothyroxine (SYNTHROID, LEVOTHROID) 100 MCG tablet Take 100 mcg by mouth daily before breakfast.   Yes Historical Provider, MD  Multiple Vitamin (MULTIVITAMIN WITH MINERALS)  TABS tablet Take 1 tablet by mouth daily.   Yes Historical Provider, MD  naproxen sodium (ANAPROX) 220 MG tablet Take 220 mg by mouth daily as needed (pain). ALEVE   Yes Historical Provider, MD     Allergies:    No Known Allergies   Physical Exam:   Vitals  Blood pressure 162/70, pulse 70, temperature 98 F (36.7 C), temperature source Oral, resp. rate 16, height 5\' 1"  (1.549 m), weight 49 kg (108 lb), SpO2 96 %.  Physical Examination: General appearance - alert, thin appearing, and in no distress Mental status - alert, oriented to person, place, and time,  Eyes - sclera anicteric Neck - supple, no JVD elevation , Chest - clear  to auscultation bilaterally, symmetrical air movement,  Heart - S1 and S2 normal,  3/6 systolic murmur Abdomen - soft, nontender, nondistended, no masses or organomegaly Neurological - screening mental status exam normal, neck supple without rigidity, cranial nerves II through XII intact, DTR's normal and symmetric Extremities - no pedal edema noted, intact peripheral pulses , negative Homans Skin - warm, dry    Data Review:    CBC  Recent Labs Lab 06/04/16 0853  WBC 12.9*  HGB 14.7  HCT 45.4  PLT 195  MCV 96.2  MCH 31.1  MCHC 32.4  RDW 13.8   ------------------------------------------------------------------------------------------------------------------  Chemistries   Recent Labs Lab 06/04/16 0853  NA 135  K 3.8  CL 104  CO2 27  GLUCOSE 94  BUN 11  CREATININE 0.75  CALCIUM 9.3   ------------------------------------------------------------------------------------------------------------------ estimated creatinine clearance is 38.1 mL/min (by C-G formula based on SCr of 0.8 mg/dL). ------------------------------------------------------------------------------------------------------------------ No results for input(s): TSH, T4TOTAL, T3FREE, THYROIDAB in the last 72 hours.  Invalid input(s): FREET3   Coagulation profile No  results for input(s): INR, PROTIME in the last 168 hours. ------------------------------------------------------------------------------------------------------------------- No results for input(s): DDIMER in the last 72 hours. -------------------------------------------------------------------------------------------------------------------  Cardiac Enzymes No results for input(s): CKMB, TROPONINI, MYOGLOBIN in the last 168 hours.  Invalid input(s): CK ------------------------------------------------------------------------------------------------------------------ No results found for: BNP   ---------------------------------------------------------------------------------------------------------------  Urinalysis    Component Value Date/Time   COLORURINE YELLOW 02/21/2015 [place] 02/21/2015 1558   LABSPEC 1.007 02/21/2015 1558   PHURINE 7.0 02/21/2015 1558   GLUCOSEU NEGATIVE 02/21/2015 1558   HGBUR NEGATIVE 02/21/2015 1558   BILIRUBINUR NEGATIVE 02/21/2015 1558   KETONESUR NEGATIVE 02/21/2015 1558   PROTEINUR NEGATIVE 02/21/2015 1558   UROBILINOGEN 0.2 02/21/2015 1558   NITRITE NEGATIVE 02/21/2015 1558   LEUKOCYTESUR MODERATE (A) 02/21/2015 1558    ----------------------------------------------------------------------------------------------------------------  Imaging Results:    Dg Chest 2 View  Result Date: 06/04/2016 CLINICAL DATA:  82 year old female with a history of nausea and feeling hot EXAM: CHEST  2 VIEW COMPARISON:  02/21/2015, 10/23/2013 FINDINGS: Cardiomediastinal silhouette unchanged. Calcifications of the aortic arch and descending thoracic aorta. Stigmata of emphysema, with increased retrosternal airspace, flattened hemidiaphragms, increased AP diameter, and hyperinflation on the AP view. No confluent airspace disease or pleural effusion.  No pneumothorax. Coarsened interstitial markings are similar in appearance to comparison studies.  Osteopenia. No displaced fracture. IMPRESSION: Chronic lung changes and emphysema with no evidence of superimposed acute cardiopulmonary disease. Aortic atherosclerosis Signed, Dulcy Fanny. Earleen Newport, DO [facility] Electronically Signed   By: Corrie Mckusick D.O.   On: 06/04/2016 09:09    Radiological Exams on Admission: Dg Chest 2 View  Result Date: 06/04/2016 CLINICAL DATA:  82 year old female with a history of nausea and feeling hot EXAM: CHEST  2 VIEW COMPARISON:  02/21/2015, 10/23/2013 FINDINGS: Cardiomediastinal silhouette unchanged. Calcifications of the aortic arch and descending thoracic aorta. Stigmata of emphysema, with increased retrosternal airspace, flattened hemidiaphragms, increased AP diameter, and hyperinflation on the AP view. No confluent airspace disease or pleural effusion.  No pneumothorax. Coarsened interstitial markings are similar in appearance to comparison studies. Osteopenia. No displaced fracture. IMPRESSION: Chronic lung changes and emphysema with no evidence of superimposed acute cardiopulmonary disease. Aortic atherosclerosis Signed, Dulcy Fanny. Earleen Newport, DO [facility] Electronically Signed   By: Corrie Mckusick D.O.   On: 06/04/2016 09:09    DVT Prophylaxis Heparin  AM Labs Ordered, also please review Full Orders  Family Communication: Admission, patients condition and plan of care including tests being ordered have been discussed with the patient and Husband who indicate understanding and agree with the plan   Code Status - Full Code  Likely DC to  home  Condition   stable  Myrta Mercer M.D on 06/04/2016 at 11:26 AM   Between 7am to 7pm - Pager - [phone]  After 7pm go to [email or web address] - password [email or web address]  [facility] - Office  [phone]  Dragon dictation system was used to create this note, attempts have been made to correct errors,  however presence of uncorrected errors is not a reflection quality of care provided.

## 2016-06-04 NOTE — ED Provider Notes (Signed)
Complains of anterior chest pain described as heaviness with associated nausea 3 episodes onset this morning, while at rest. Presently asymptomatic no shortness of breath no sweatiness. Treated himself with aspirin prior to coming here. On exam no distress lungs clear auscultation heart regular rate and rhythm abdomen nondistended nontender extremities without edema   Orlie Dakin, MD 06/04/16 1028

## 2016-06-04 NOTE — ED Provider Notes (Signed)
[facility] DEPT Provider Note   CSN: [id number] Arrival date & time: 06/04/16  0827     History   Chief Complaint Chief Complaint  Patient presents with  . Chest Pain    HPI Mackenzie Madden is a 82 y.o. female.  HPI   82 year old female with history of thyroid disease and hypertension brought here by family member from home for evaluation of chest pain. Patient reports this morning while she was getting ready for church she experienced a dull sensation spreading across her chest after she got up. She described pain as moderate, with pain to her back  lasting for approximately 5 minutes. She took some aspirin, her pain resolved and came back again. During that episode she did endorsed nauseawithout vomiting. She reports 3 separate episodes of the chest discomfort prompting her to come to the ER for further valuation. Now that she is here, her pain has completely resolved. She denies any associated fever, chills, headache, lightheadedness, dizziness, shortness of breath, diaphoresis, abdominal pain or arm pain. She does not have any significant cardiac history denies any recent cardiac stress test. Denies any heavy lifting or strenuous activities leading up to this event. She is a nonsmoker. The pain did not wake her up this morning.   Past Medical History:  Diagnosis Date  . Hypertension   . Thyroid disease     Patient Active Problem List   Diagnosis Date Noted  . Faintness   . Chronic diastolic heart failure ([place])   . Intraocular pressure increase   . Syncope 02/21/2015  . HTN (hypertension) 02/21/2015  . Hypothyroid 02/21/2015    History reviewed. No pertinent surgical history.  OB History    No data available       Home Medications    Prior to Admission medications   Medication Sig Start Date End Date Taking? Authorizing Provider  amLODipine (NORVASC) 5 MG tablet Take 0.5 tablets (2.5 mg total) by mouth daily. 02/22/15   Barton Dubois, MD    brimonidine-timolol (COMBIGAN) 0.2-0.5 % ophthalmic solution Place 1 drop into the left eye every 12 (twelve) hours.    Historical Provider, MD  Calcium Carb-Cholecalciferol (CALCIUM 600 + D PO) Take 1,200 mg by mouth at bedtime.    Historical Provider, MD  latanoprost (XALATAN) 0.005 % ophthalmic solution Place 1 drop into both eyes at bedtime. 01/11/15   Historical Provider, MD  levothyroxine (SYNTHROID, LEVOTHROID) 88 MCG tablet Take 88 mcg by mouth daily before breakfast.  01/01/15   Historical Provider, MD  Multiple Vitamin (MULTIVITAMIN WITH MINERALS) TABS tablet Take 1 tablet by mouth daily.    Historical Provider, MD  naproxen sodium (ANAPROX) 220 MG tablet Take 220 mg by mouth daily as needed (pain). [place]    Historical Provider, MD    Family History No family history on file.  Social History Social History  Substance Use Topics  . Smoking status: Never Smoker  . Smokeless tobacco: Never Used  . Alcohol use No     Allergies   Review of patient's allergies indicates no known allergies.   Review of Systems Review of Systems  All other systems reviewed and are negative.    Physical Exam Updated Vital Signs BP 191/77   Pulse 72   Temp 98 F (36.7 C) (Oral)   Resp 18   Ht 5\' 1"  (1.549 m)   Wt 49 kg   SpO2 99%   BMI 20.41 kg/m   Physical Exam  Constitutional: She appears well-developed and well-nourished. No  distress.  Well-appearing elderly female in no acute discomfort.  HENT:  Head: Atraumatic.  Eyes: Conjunctivae are normal.  Neck: Neck supple. No JVD present.  Cardiovascular: Normal rate, regular rhythm and intact distal pulses.   Pulmonary/Chest: Effort normal and breath sounds normal. She exhibits no tenderness.  Abdominal: Soft. There is no tenderness.  Musculoskeletal: She exhibits no edema.  Neurological: She is alert.  Skin: No rash noted.  Psychiatric: She has a normal mood and affect.  Nursing note and vitals reviewed.    ED Treatments /  Results  Labs (all labs ordered are listed, but only abnormal results are displayed) Labs Reviewed  BASIC METABOLIC PANEL - Abnormal; Notable for the following:       Result Value   Anion gap 4 (*)    All other components within normal limits  CBC - Abnormal; Notable for the following:    WBC 12.9 (*)    All other components within normal limits  I-STAT TROPOININ, ED    EKG  EKG Interpretation None     ED ECG REPORT   Date: 06/04/2016 @ 0928  Rate: 71  Rhythm: normal sinus rhythm  QRS Axis: normal  Intervals: normal  ST/T Wave abnormalities: nonspecific ST/T changes  Conduction Disutrbances:left bundle branch block  Narrative Interpretation:   Old EKG Reviewed: unchanged  I have personally reviewed the EKG tracing and agree with the computerized printout as noted.   Radiology Dg Chest 2 View  Result Date: 06/04/2016 CLINICAL DATA:  82 year old female with a history of nausea and feeling hot EXAM: CHEST  2 VIEW COMPARISON:  02/21/2015, 10/23/2013 FINDINGS: Cardiomediastinal silhouette unchanged. Calcifications of the aortic arch and descending thoracic aorta. Stigmata of emphysema, with increased retrosternal airspace, flattened hemidiaphragms, increased AP diameter, and hyperinflation on the AP view. No confluent airspace disease or pleural effusion.  No pneumothorax. Coarsened interstitial markings are similar in appearance to comparison studies. Osteopenia. No displaced fracture. IMPRESSION: Chronic lung changes and emphysema with no evidence of superimposed acute cardiopulmonary disease. Aortic atherosclerosis Signed, Dulcy Fanny. Earleen Newport, DO [facility] Radiology Electronically Signed   By: Corrie Mckusick D.O.   On: 06/04/2016 09:09    Procedures Procedures (including critical care time)  Medications Ordered in ED Medications  nitroGLYCERIN (NITROGLYN) 2 % ointment 1 inch (1 inch Topical Given 06/04/16 1034)     Initial  Impression / Assessment and Plan / ED Course  I have reviewed the triage vital signs and the nursing notes.  Pertinent labs & imaging results that were available during my care of the patient were reviewed by me and considered in my medical decision making (see chart for details).  Clinical Course    BP 191/77   Pulse 72   Temp 98 F (36.7 C) (Oral)   Resp 18   Ht 5\' 1"  (1.549 m)   Wt 49 kg   SpO2 99%   BMI 20.41 kg/m    Final Clinical Impressions(s) / ED Diagnoses   Final diagnoses:  Atypical chest pain    New Prescriptions New Prescriptions   No medications on file   9:58 AM Patient here with intermittent chest pain this a.m. The pain is atypical for ACS. Pain is has completely resolved. She has intact distal pulses, low suspicion for dissection. She does not have any reproducible chest wall pain or back pain on exam. Given her age, plan to have patient admitted for further cardiac workup. HEART score of 4, which puts her at  moderate risk of MACE.  Care discussed with DR. Winfred Leeds.  10:29 AM Dr. Winfred Leeds recommend nitro paste 1inch and have pt admit for obs.    11:11 AM Appreciate consultation from [facility] Dr. Denton Brick who agrees to see pt in the ER and will admit to telemetry floor under his care for cardiac rule out work up.  Pt stable at this time.    Domenic Moras, PA-C 06/04/16 1112    Orlie Dakin, MD 06/04/16 [phone]

## 2016-06-05 ENCOUNTER — Observation Stay (HOSPITAL_COMMUNITY): Payer: Medicare Other

## 2016-06-05 ENCOUNTER — Encounter (HOSPITAL_COMMUNITY): Payer: Self-pay | Admitting: Cardiology

## 2016-06-05 DIAGNOSIS — R079 Chest pain, unspecified: Secondary | ICD-10-CM

## 2016-06-05 DIAGNOSIS — I350 Nonrheumatic aortic (valve) stenosis: Secondary | ICD-10-CM | POA: Diagnosis not present

## 2016-06-05 DIAGNOSIS — I10 Essential (primary) hypertension: Secondary | ICD-10-CM | POA: Diagnosis not present

## 2016-06-05 DIAGNOSIS — R740 Nonspecific elevation of levels of transaminase and lactic acid dehydrogenase [LDH]: Secondary | ICD-10-CM | POA: Diagnosis not present

## 2016-06-05 DIAGNOSIS — R0789 Other chest pain: Secondary | ICD-10-CM | POA: Diagnosis not present

## 2016-06-05 LAB — BASIC METABOLIC PANEL
ANION GAP: 4 — AB (ref 5–15)
BUN: 10 mg/dL (ref 6–20)
CHLORIDE: 106 mmol/L (ref 101–111)
CO2: 28 mmol/L (ref 22–32)
CREATININE: 0.88 mg/dL (ref 0.44–1.00)
Calcium: 9.2 mg/dL (ref 8.9–10.3)
GFR calc non Af Amer: 58 mL/min — ABNORMAL LOW (ref >60)
Glucose, Bld: 107 mg/dL — ABNORMAL HIGH (ref 65–99)
POTASSIUM: 3.9 mmol/L (ref 3.5–5.1)
SODIUM: 138 mmol/L (ref 135–145)

## 2016-06-05 LAB — HEPATIC FUNCTION PANEL
ALK PHOS: 159 U/L — AB (ref 38–126)
ALT: 329 U/L — AB (ref 14–54)
AST: 558 U/L — AB (ref 15–41)
Albumin: 3.2 g/dL — ABNORMAL LOW (ref 3.5–5.0)
BILIRUBIN DIRECT: 1.2 mg/dL — AB (ref 0.1–0.5)
Indirect Bilirubin: 1 mg/dL — ABNORMAL HIGH (ref 0.3–0.9)
Total Bilirubin: 2.2 mg/dL — ABNORMAL HIGH (ref 0.3–1.2)
Total Protein: 6.1 g/dL — ABNORMAL LOW (ref 6.5–8.1)

## 2016-06-05 LAB — CBC
HEMATOCRIT: 41.8 % (ref 36.0–46.0)
HEMOGLOBIN: 13.6 g/dL (ref 12.0–15.0)
MCH: 31.1 pg (ref 26.0–34.0)
MCHC: 32.5 g/dL (ref 30.0–36.0)
MCV: 95.4 fL (ref 78.0–100.0)
PLATELETS: 178 10*3/uL (ref 150–400)
RBC: 4.38 MIL/uL (ref 3.87–5.11)
RDW: 14 % (ref 11.5–15.5)
WBC: 8.7 10*3/uL (ref 4.0–10.5)

## 2016-06-05 LAB — NM MYOCAR MULTI W/SPECT W/WALL MOTION / EF
CSEPEDS: 14 s
CSEPEW: 1 METS
CSEPPHR: 105 {beats}/min
Exercise duration (min): 5 min
Rest HR: 70 {beats}/min

## 2016-06-05 LAB — TROPONIN I: Troponin I: <0.03 ng/mL (ref <0.03)

## 2016-06-05 LAB — LIPASE, BLOOD: Lipase: 78 U/L — ABNORMAL HIGH (ref 11–51)

## 2016-06-05 MED ORDER — REGADENOSON 0.4 MG/5ML IV SOLN
0.4000 mg | Freq: Once | INTRAVENOUS | Status: AC
  Administered 2016-06-05: 0.4 mg via INTRAVENOUS
  Filled 2016-06-05: qty 5

## 2016-06-05 MED ORDER — TECHNETIUM TC 99M TETROFOSMIN IV KIT
10.0000 | PACK | Freq: Once | INTRAVENOUS | Status: AC | PRN
  Administered 2016-06-05: 10 via INTRAVENOUS

## 2016-06-05 MED ORDER — FAMOTIDINE IN NACL 20-0.9 MG/50ML-% IV SOLN
20.0000 mg | Freq: Two times a day (BID) | INTRAVENOUS | Status: DC
  Administered 2016-06-05 – 2016-06-07 (×4): 20 mg via INTRAVENOUS
  Filled 2016-06-05 (×4): qty 50

## 2016-06-05 MED ORDER — TECHNETIUM TC 99M TETROFOSMIN IV KIT
30.0000 | PACK | Freq: Once | INTRAVENOUS | Status: AC | PRN
  Administered 2016-06-05: 30 via INTRAVENOUS

## 2016-06-05 MED ORDER — REGADENOSON 0.4 MG/5ML IV SOLN
INTRAVENOUS | Status: AC
  Administered 2016-06-05: 0.4 mg via INTRAVENOUS
  Filled 2016-06-05: qty 5

## 2016-06-05 MED ORDER — ONDANSETRON HCL 4 MG/2ML IJ SOLN
INTRAMUSCULAR | Status: AC
  Filled 2016-06-05: qty 2

## 2016-06-05 MED ORDER — MORPHINE SULFATE (PF) 2 MG/ML IV SOLN: 0.5000 mg | INTRAVENOUS | Status: DC | PRN

## 2016-06-05 MED ORDER — DEXTROSE 5 % IV SOLN
1.0000 g | INTRAVENOUS | Status: DC
  Administered 2016-06-05 – 2016-06-06 (×2): 1 g via INTRAVENOUS
  Filled 2016-06-05 (×4): qty 10

## 2016-06-05 MED ORDER — SODIUM CHLORIDE 0.9 % IV SOLN
INTRAVENOUS | Status: DC
  Administered 2016-06-05 – 2016-06-06 (×2): via INTRAVENOUS

## 2016-06-05 NOTE — Progress Notes (Signed)
Patient presented for Lexiscan. Tolerated procedure well. Pending final stress imaging result.  

## 2016-06-05 NOTE — Consult Note (Signed)
Patient ID: ZYIONNA BRACCIO MRN: [id number], DOB/AGE: 1928/03/23   Admit date: 06/04/2016   Primary Physician: Irven Shelling, MD Primary Cardiologist: New  Reason for Consult: Atypical Chest Pain  Requesting MD: Dr. Roxan Hockey, Internal Medicine   Pt. Profile:  82 y/o female with h/o HTN and hypothyroidism, admitted for CP evaluation.   Problem List  Past Medical History:  Diagnosis Date  . Hypertension   . Thyroid disease     History reviewed. No pertinent surgical history.   Allergies  No Known Allergies  HPI  82 y/o female with h/o HTN and hypothyroidism, admitted for CP evaluation. She was previously on meds for BP but all were discontinued about 1 year ago due to symptoms of what sound like orthostatic hypotension. Symptoms resolved after discontinuation of meds. She reports history of murmur since childhood but no issues with this. No h/o DM nor tobacco abuse. She reports her mother had a MI in her 26s. She had an echo in 2016 that showed normal EF and mild MR and mild MS.   She is very independent for her age. She ambulates w/o difficulty and performs ADLS and house hold chores w/o exertional dyspnea nor chest pain.  She was in her usual state of health until yesterday AM. She was getting dressed for church when she developed resting substernal chest pressure radiating to her back. She felt nauseated but no vomiting. No dyspnea, diaphoresis, syncope/ near syncope. Episode lasted ~5 min then spontaneously resolved. She checked her BP and it was elevated in the [id number] systolic. She had recurrent CP and took an ASA. Symptoms continued to come and go, prompting her to report to ED. On arrival she was CP free. She was placed back on BP meds, amlodipine and metoprolol. She has had no recurrent pain since. Cardiac enzymes are negative x 3. EKG shows chronic LBBB.    Home Medications  Prior to Admission medications   Medication Sig Start Date End Date Taking?  Authorizing Provider  amLODipine (NORVASC) 5 MG tablet Take 0.5 tablets (2.5 mg total) by mouth daily. Patient taking differently: Take 5 mg by mouth daily.  02/22/15  Yes Barton Dubois, MD  aspirin 325 MG tablet Take 325 mg by mouth every 6 (six) hours as needed for mild pain.   Yes Historical Provider, MD  brimonidine-timolol (COMBIGAN) 0.2-0.5 % ophthalmic solution Place 1 drop into the left eye every 12 (twelve) hours.   Yes Historical Provider, MD  Calcium Carb-Cholecalciferol (CALCIUM 600 + D PO) Take 1,200 mg by mouth at bedtime.   Yes Historical Provider, MD  latanoprost (XALATAN) 0.005 % ophthalmic solution Place 1 drop into both eyes at bedtime. 01/11/15  Yes Historical Provider, MD  levothyroxine (SYNTHROID, LEVOTHROID) 100 MCG tablet Take 100 mcg by mouth daily before breakfast.   Yes Historical Provider, MD  Multiple Vitamin (MULTIVITAMIN WITH MINERALS) TABS tablet Take 1 tablet by mouth daily.   Yes Historical Provider, MD  naproxen sodium (ANAPROX) 220 MG tablet Take 220 mg by mouth daily as needed (pain). [place]   Yes Historical Provider, MD    Family History  Family History  Problem Relation Age of Onset  . CAD Mother     Social History  Social History   Social History  . Marital status: Married    Spouse name: N/A  . Number of children: N/A  . Years of education: N/A   Occupational History  . Not on file.   Social History Main Topics  .  Smoking status: Never Smoker  . Smokeless tobacco: Never Used  . Alcohol use No  . Drug use: No  . Sexual activity: Not on file   Other Topics Concern  . Not on file   Social History Narrative  . No narrative on file     Review of Systems General:  No chills, fever, night sweats or weight changes.  Cardiovascular:  + chest pain, no dyspnea on exertion, edema, orthopnea, palpitations, paroxysmal nocturnal dyspnea. Dermatological: No rash, lesions/masses Respiratory: No cough, dyspnea Urologic: No hematuria,  dysuria Abdominal:   No nausea, vomiting, diarrhea, bright red blood per rectum, melena, or hematemesis Neurologic:  No visual changes, wkns, changes in mental status. All other systems reviewed and are otherwise negative except as noted above.  Physical Exam  Blood pressure (!) 144/65, pulse 74, temperature 98.4 F (36.9 C), temperature source Oral, resp. rate 18, height 5\' 1"  (1.549 m), weight 107 lb 12.8 oz (48.9 kg), SpO2 98 %.  General: Pleasant, NAD Psych: Normal affect. Neuro: Alert and oriented X 3. Moves all extremities spontaneously. HEENT: Normal  Neck: Supple without bruits or JVD. Lungs:  Resp regular and unlabored, CTA. Heart: RRR no s3, s4, or murmurs. Abdomen: Soft, non-tender, non-distended, BS + x 4.  Extremities: No clubbing, cyanosis or edema. DP/PT/Radials 2+ and equal bilaterally.  Labs  Troponin [facility] of Care Test)  Recent Labs  06/04/16 0856  TROPIPOC 0.00    Recent Labs  06/04/16 1417 06/04/16 2154 06/05/16 0420  TROPONINI <0.03 <0.03 <0.03   Lab Results  Component Value Date   WBC 8.7 06/05/2016   HGB 13.6 06/05/2016   HCT 41.8 06/05/2016   MCV 95.4 06/05/2016   PLT 178 06/05/2016     Recent Labs Lab 06/05/16 0420  NA 138  K 3.9  CL 106  CO2 28  BUN 10  CREATININE 0.88  CALCIUM 9.2  GLUCOSE 107*   Lab Results  Component Value Date   CHOL  07/10/2009    153        ATP III CLASSIFICATION:  <200     mg/dL   Desirable  200-239  mg/dL   Borderline High  >=240    mg/dL   High          HDL 31 (L) 07/10/2009   LDLCALC  07/10/2009    99        Total Cholesterol/HDL:CHD Risk Coronary Heart Disease Risk Table                     Men   Women  1/2 Average Risk   3.4   3.3  Average Risk       5.0   4.4  2 X Average Risk   9.6   7.1  3 X Average Risk  23.4   11.0        Use the calculated Patient Ratio above and the CHD Risk Table to determine the patient's CHD Risk.        ATP III CLASSIFICATION (LDL):  <100     mg/dL    Optimal  100-129  mg/dL   Near or Above                    Optimal  130-159  mg/dL   Borderline  160-189  mg/dL   High  >190     mg/dL   Very High   TRIG 117 07/10/2009   Lab Results  Component Value Date  DDIMER (H) 07/14/2009    2.84        AT THE INHOUSE ESTABLISHED CUTOFF VALUE OF 0.48 ug/mL FEU, THIS ASSAY HAS BEEN DOCUMENTED IN THE LITERATURE TO HAVE A SENSITIVITY AND NEGATIVE PREDICTIVE VALUE OF AT LEAST 98 TO 99%.  THE TEST RESULT SHOULD BE CORRELATED WITH AN ASSESSMENT OF THE CLINICAL PROBABILITY OF DVT / VTE.     Radiology/Studies  Dg Chest 2 View  Result Date: 06/04/2016 CLINICAL DATA:  82 year old female with a history of nausea and feeling hot EXAM: CHEST  2 VIEW COMPARISON:  02/21/2015, 10/23/2013 FINDINGS: Cardiomediastinal silhouette unchanged. Calcifications of the aortic arch and descending thoracic aorta. Stigmata of emphysema, with increased retrosternal airspace, flattened hemidiaphragms, increased AP diameter, and hyperinflation on the AP view. No confluent airspace disease or pleural effusion.  No pneumothorax. Coarsened interstitial markings are similar in appearance to comparison studies. Osteopenia. No displaced fracture. IMPRESSION: Chronic lung changes and emphysema with no evidence of superimposed acute cardiopulmonary disease. Aortic atherosclerosis Signed, Dulcy Fanny. Earleen Newport, DO [facility] Radiology Electronically Signed   By: Corrie Mckusick D.O.   On: 06/04/2016 09:09    ECG  Chronic LBBB  ASSESSMENT AND PLAN  1. Chest pain with Moderate Risk for Cardiac Etiology: cardiac enzymes are negative x 3, however symptoms were typical in nature. Recommend NST to r/o ischemia. Obtain 2D echo.    Signed, Lyda Jester, PA-C 06/05/2016, 10:31 AM  The patient was seen and examined, and I agree with the history, physical exam, assessment and plan as documented above which has been discussed with B. Simmons,  with modifications as noted below. Pt admitted with chest pain and hypertension. Had normal LVEF and mild AS by echo in 01/2015. Has chronic LBBB on ECG. Will plan for nuclear stress test today to evaluate for ischemic etiology.  Kate Sable, MD, [facility]  06/05/2016 10:59 AM

## 2016-06-05 NOTE — Progress Notes (Signed)
Stress test:  1. No reversible ischemia or infarction.  2. Septal hypokinesis without other regional left ventricular wall abnormality.  3. Left ventricular ejection fraction 69%  4. Non invasive risk stratification*: Low   Further disposition pending echocardiogram.

## 2016-06-05 NOTE — Care Management Obs Status (Signed)
[facility] NOTIFICATION   Patient Details  Name: DEMARIA STEURER MRN: [id number] Date of Birth: [birth year]   Medicare Observation Status Notification Given:  Yes    Bethena Roys, RN 06/05/2016, 11:50 AM

## 2016-06-05 NOTE — Progress Notes (Signed)
PROGRESS NOTE    [name]  [id number] DOB: [birth year] DOA: 06/04/2016 PCP: Irven Shelling, MD    Brief Narrative: Mackenzie Madden  is a 82 y.o. female,With past medical history relevant for hypertension and hypothyroidism, otherwise healthy now presents With intermittent episodes of exertional retrosternal left-sided chest discomfort started this morning. Chest discomfort was associated with some nausea but no emesis, no diaphoresis, no dizziness and palpitations no significant shortness of breath no pleuritic symptoms no leg pains or leg swelling.   Assessment & Plan:   Active Problems:   Atypical chest pain   Chest pain  1-Chest Pain;  Troponin negative. EKG left BBB.  Cardiology consulted, planning Myoview today.  Started on metoprolol.   2-Nausea, chest pain; Transaminases I have ordered LFT and lipase which came back elevated.  NPO for now for RUQ [place], depending on results might be able to start clear diet.  IV fluids.  Check hepatitis panel.   3-Leukocytosis; UA growing E coli 100 K. Start ceftriaxone to cover also  for any intra -abdominal infection.   4-Hypothyroidism- continue with levothyroxine. TSH; 0.788.  DVT prophylaxis: Heparin  Code Status: Full code.  Family Communication; multiples family member at bedside.  Disposition Plan: remain in hospital, probably home at time of discharge    Consultants:   cardiology   Procedures:   ECHO;  Stress test  US abdomen.   Antimicrobials:   Ceftriaxone 9-04   Subjective: Report nausea this morning.  Had chest pain yesterday, sharp, accompanied by nausea.  Denies worsening pain with meals. No prior history of chest pain./   Objective: Vitals:   06/04/16 2139 06/04/16 2222 06/04/16 2309 06/05/16 0508  BP: (!) 150/65 (!) 142/81  (!) 144/65  Pulse: 77 78  74  Resp: 17  19 18   Temp: 98.4 F (36.9 C)   98.4 F (36.9 C)  TempSrc: Oral   Oral  SpO2: 94%   98%  Weight:    48.9 kg  (107 lb 12.8 oz)  Height:        Intake/Output Summary (Last 24 hours) at 06/05/16 1313 Last data filed at 06/05/16 0840  Gross per 24 hour  Intake              120 ml  Output              550 ml  Net             -430 ml   Filed Weights   06/04/16 0833 06/04/16 1158 06/05/16 0508  Weight: 49 kg (108 lb) 49.2 kg (108 lb 6.4 oz) 48.9 kg (107 lb 12.8 oz)    Examination:  General exam: Appears calm and comfortable  Respiratory system: Clear to auscultation. Respiratory effort normal. Cardiovascular system: S1 & S2 heard, RRR. No JVD, murmurs, rubs, gallops or clicks. No pedal edema. Gastrointestinal system: Abdomen is nondistended, soft and nontender. No organomegaly or masses felt. Normal bowel sounds heard. Central nervous system: Alert and oriented. No focal neurological deficits. Extremities: Symmetric 5 x 5 power. Skin: No rashes, lesions or ulcers Psychiatry: Judgement and insight appear normal. Mood & affect appropriate.     Data Reviewed: I have personally reviewed following labs and imaging studies  CBC:  Recent Labs Lab 06/04/16 0853 06/05/16 0420  WBC 12.9* 8.7  HGB 14.7 13.6  HCT 45.4 41.8  MCV 96.2 95.4  PLT 195 0000000   Basic Metabolic Panel:  Recent Labs Lab 06/04/16 0853 06/05/16 0420  NA 135 138  K 3.8 3.9  CL 104 106  CO2 27 28  GLUCOSE 94 107*  BUN 11 10  CREATININE 0.75 0.88  CALCIUM 9.3 9.2   GFR: Estimated Creatinine Clearance: 34.6 mL/min (by C-G formula based on SCr of 0.88 mg/dL). Liver Function Tests:  Recent Labs Lab 06/05/16 1139  AST 558*  ALT 329*  ALKPHOS 159*  BILITOT 2.2*  PROT 6.1*  ALBUMIN 3.2*    Recent Labs Lab 06/05/16 1139  LIPASE 78*   No results for input(s): AMMONIA in the last 168 hours. Coagulation Profile: No results for input(s): INR, PROTIME in the last 168 hours. Cardiac Enzymes:  Recent Labs Lab 06/04/16 1417 06/04/16 2154 06/05/16 0420  TROPONINI <0.03 <0.03 <0.03   BNP (last 3  results) No results for input(s): PROBNP in the last 8760 hours. HbA1C: No results for input(s): HGBA1C in the last 72 hours. CBG: No results for input(s): GLUCAP in the last 168 hours. Lipid Profile: No results for input(s): CHOL, HDL, LDLCALC, TRIG, CHOLHDL, LDLDIRECT in the last 72 hours. Thyroid Function Tests:  Recent Labs  06/04/16 1417  TSH 0.788   Anemia Panel: No results for input(s): VITAMINB12, FOLATE, FERRITIN, TIBC, IRON, RETICCTPCT in the last 72 hours. Sepsis Labs: No results for input(s): PROCALCITON, LATICACIDVEN in the last 168 hours.  Recent Results (from the past 240 hour(s))  Urine culture     Status: Abnormal (Preliminary result)   Collection Time: 06/04/16  3:01 PM  Result Value Ref Range Status   Specimen Description URINE, CLEAN CATCH  Final   Special Requests Normal  Final   Culture >=100,000 COLONIES/mL ESCHERICHIA COLI (A)  Final   Report Status PENDING  Incomplete         Radiology Studies: Dg Chest 2 View  Result Date: 06/04/2016 CLINICAL DATA:  82 year old female with a history of nausea and feeling hot EXAM: CHEST  2 VIEW COMPARISON:  02/21/2015, 10/23/2013 FINDINGS: Cardiomediastinal silhouette unchanged. Calcifications of the aortic arch and descending thoracic aorta. Stigmata of emphysema, with increased retrosternal airspace, flattened hemidiaphragms, increased AP diameter, and hyperinflation on the AP view. No confluent airspace disease or pleural effusion.  No pneumothorax. Coarsened interstitial markings are similar in appearance to comparison studies. Osteopenia. No displaced fracture. IMPRESSION: Chronic lung changes and emphysema with no evidence of superimposed acute cardiopulmonary disease. Aortic atherosclerosis Signed, Dulcy Madden. Mackenzie Newport, DO [facility] Radiology Electronically Signed   By: Corrie Mckusick D.O.   On: 06/04/2016 09:09        Scheduled Meds: . amLODipine  5 mg Oral  Daily  . brimonidine  1 drop Left Eye BID  . calcium-vitamin D  2 tablet Oral Daily  . cefTRIAXone (ROCEPHIN)  IV  1 g Intravenous Q24H  . famotidine (PEPCID) IV  20 mg Intravenous Q12H  . heparin  5,000 Units Subcutaneous Q8H  . latanoprost  1 drop Both Eyes QHS  . levothyroxine  100 mcg Oral QAC breakfast  . metoprolol tartrate  25 mg Oral BID  . multivitamin with minerals  1 tablet Oral Daily  . senna  1 tablet Oral BID  . sodium chloride flush  3 mL Intravenous Q12H  . sodium chloride flush  3 mL Intravenous Q12H  . timolol  1 drop Left Eye BID   Continuous Infusions: . sodium chloride       LOS: 0 days    Time spent: 35 minutes.     Elmarie Shiley, MD [facility] Pager [phone]  If 7PM-7AM, please contact night-coverage [email or web address] Password [email or web address] 06/05/2016, 1:13 PM

## 2016-06-06 ENCOUNTER — Inpatient Hospital Stay (HOSPITAL_COMMUNITY): Payer: Medicare Other

## 2016-06-06 DIAGNOSIS — R935 Abnormal findings on diagnostic imaging of other abdominal regions, including retroperitoneum: Secondary | ICD-10-CM | POA: Diagnosis not present

## 2016-06-06 DIAGNOSIS — I10 Essential (primary) hypertension: Secondary | ICD-10-CM | POA: Diagnosis present

## 2016-06-06 DIAGNOSIS — Z791 Long term (current) use of non-steroidal anti-inflammatories (NSAID): Secondary | ICD-10-CM | POA: Diagnosis not present

## 2016-06-06 DIAGNOSIS — K838 Other specified diseases of biliary tract: Secondary | ICD-10-CM | POA: Diagnosis not present

## 2016-06-06 DIAGNOSIS — R740 Nonspecific elevation of levels of transaminase and lactic acid dehydrogenase [LDH]: Secondary | ICD-10-CM | POA: Diagnosis present

## 2016-06-06 DIAGNOSIS — R748 Abnormal levels of other serum enzymes: Secondary | ICD-10-CM | POA: Diagnosis not present

## 2016-06-06 DIAGNOSIS — R079 Chest pain, unspecified: Secondary | ICD-10-CM

## 2016-06-06 DIAGNOSIS — E039 Hypothyroidism, unspecified: Secondary | ICD-10-CM | POA: Diagnosis present

## 2016-06-06 DIAGNOSIS — N390 Urinary tract infection, site not specified: Secondary | ICD-10-CM | POA: Diagnosis present

## 2016-06-06 DIAGNOSIS — Z8249 Family history of ischemic heart disease and other diseases of the circulatory system: Secondary | ICD-10-CM | POA: Diagnosis not present

## 2016-06-06 DIAGNOSIS — B9620 Unspecified Escherichia coli [E. coli] as the cause of diseases classified elsewhere: Secondary | ICD-10-CM | POA: Diagnosis present

## 2016-06-06 DIAGNOSIS — R0789 Other chest pain: Secondary | ICD-10-CM | POA: Diagnosis present

## 2016-06-06 DIAGNOSIS — Z9049 Acquired absence of other specified parts of digestive tract: Secondary | ICD-10-CM | POA: Diagnosis not present

## 2016-06-06 DIAGNOSIS — I447 Left bundle-branch block, unspecified: Secondary | ICD-10-CM | POA: Diagnosis present

## 2016-06-06 DIAGNOSIS — Z7982 Long term (current) use of aspirin: Secondary | ICD-10-CM | POA: Diagnosis not present

## 2016-06-06 DIAGNOSIS — Z79899 Other long term (current) drug therapy: Secondary | ICD-10-CM | POA: Diagnosis not present

## 2016-06-06 LAB — COMPREHENSIVE METABOLIC PANEL
ALT: 233 U/L — ABNORMAL HIGH (ref 14–54)
AST: 232 U/L — ABNORMAL HIGH (ref 15–41)
Albumin: 2.8 g/dL — ABNORMAL LOW (ref 3.5–5.0)
Alkaline Phosphatase: 155 U/L — ABNORMAL HIGH (ref 38–126)
Anion gap: 4 — ABNORMAL LOW (ref 5–15)
BUN: 10 mg/dL (ref 6–20)
CO2: 24 mmol/L (ref 22–32)
Calcium: 8.8 mg/dL — ABNORMAL LOW (ref 8.9–10.3)
Chloride: 108 mmol/L (ref 101–111)
Creatinine, Ser: 0.8 mg/dL (ref 0.44–1.00)
GFR calc Af Amer: >60 mL/min (ref >60)
GFR calc non Af Amer: >60 mL/min (ref >60)
Glucose, Bld: 98 mg/dL (ref 65–99)
Potassium: 3.7 mmol/L (ref 3.5–5.1)
Sodium: 136 mmol/L (ref 135–145)
Total Bilirubin: 3.6 mg/dL — ABNORMAL HIGH (ref 0.3–1.2)
Total Protein: 5.5 g/dL — ABNORMAL LOW (ref 6.5–8.1)

## 2016-06-06 LAB — LIPASE, BLOOD: LIPASE: 28 U/L (ref 11–51)

## 2016-06-06 LAB — URINE CULTURE
Culture: >=100000 — AB
Special Requests: NORMAL

## 2016-06-06 LAB — HEPATITIS PANEL, ACUTE
HEP A IGM: NEGATIVE
HEP B S AG: NEGATIVE
Hep B C IgM: NEGATIVE

## 2016-06-06 NOTE — Consult Note (Signed)
[facility] Consult Note  Referring Provider: No ref. provider found Primary Care Physician:  Irven Shelling, MD Primary Gastroenterologist:  Dr.  Laurel Dimmer Complaint: Elevated liver function tests and abnormal abdominal ultrasound HPI: Mackenzie Madden is an 82 y.o. white female  admitted with atypical chest pain with negative cardiac workup found to have elevated liver function tests with transaminases in the 200s and bilirubin 3.6 with abdominal ultrasound showing markedly intrahepatic ductal dilatation status post cholecystectomy unchanged from 2012. The patient thinks her cholecystectomy was done in the 80s. Acute hepatitis panel here was negative. Patient does not drink alcohol.  Past Medical History:  Diagnosis Date  . Hypertension   . Thyroid disease     History reviewed. No pertinent surgical history.  Medications Prior to Admission  Medication Sig Dispense Refill  . amLODipine (NORVASC) 5 MG tablet Take 0.5 tablets (2.5 mg total) by mouth daily. (Patient taking differently: Take 5 mg by mouth daily. )    . aspirin 325 MG tablet Take 325 mg by mouth every 6 (six) hours as needed for mild pain.    . brimonidine-timolol (COMBIGAN) 0.2-0.5 % ophthalmic solution Place 1 drop into the left eye every 12 (twelve) hours.    . Calcium Carb-Cholecalciferol (CALCIUM 600 + D PO) Take 1,200 mg by mouth at bedtime.    Marland Kitchen latanoprost (XALATAN) 0.005 % ophthalmic solution Place 1 drop into both eyes at bedtime.  11  . levothyroxine (SYNTHROID, LEVOTHROID) 100 MCG tablet Take 100 mcg by mouth daily before breakfast.    . Multiple Vitamin (MULTIVITAMIN WITH MINERALS) TABS tablet Take 1 tablet by mouth daily.    . naproxen sodium (ANAPROX) 220 MG tablet Take 220 mg by mouth daily as needed (pain). ALEVE      Allergies: No Known Allergies  Family History  Problem Relation Age of Onset  . CAD Mother     Social History:  reports that she has never smoked. She has never used  smokeless tobacco. She reports that she does not drink alcohol or use drugs.  Review of Systems: negative except As above   Blood pressure (!) 147/72, pulse 66, temperature 98.4 F (36.9 C), temperature source Oral, resp. rate 16, height _0  (1.549 m), weight 49.9 kg (110 lb 1.6 oz), SpO2 96 %. Head: Normocephalic, without obvious abnormality, atraumatic Neck: no adenopathy, no carotid bruit, no JVD, supple, symmetrical, trachea midline and thyroid not enlarged, symmetric, no tenderness/mass/nodules Resp: clear to auscultation bilaterally Cardio: regular rate and rhythm, S1, S2 normal, no murmur, click, rub or gallop GI: Abdomen soft nondistended with normoactive bowel sounds. No hepatosplenomegaly mass or guarding Extremities: extremities normal, atraumatic, no cyanosis or edema  Results for orders placed or performed during the hospital encounter of 06/04/16 (from the past 48 hour(s))  Urinalysis, Routine w reflex microscopic (not at [facility])     Status: Abnormal   Collection Time: 06/04/16 12:30 PM  Result Value Ref Range   Color, Urine YELLOW YELLOW   APPearance CLEAR CLEAR   Specific Gravity, Urine 1.010 1.005 - 1.030   pH 7.5 5.0 - 8.0   Glucose, UA NEGATIVE NEGATIVE mg/dL   Hgb urine dipstick SMALL (A) NEGATIVE   Bilirubin Urine NEGATIVE NEGATIVE   Ketones, ur NEGATIVE NEGATIVE mg/dL   Protein, ur NEGATIVE NEGATIVE mg/dL   Nitrite NEGATIVE NEGATIVE   Leukocytes, UA LARGE (A) NEGATIVE  Urine microscopic-add on     Status: Abnormal   Collection Time: 06/04/16 12:30 PM  Result Value Ref Range  Squamous Epithelial / LPF NONE SEEN NONE SEEN   WBC, UA 6-30 0 - 5 WBC/hpf   RBC / HPF 0-5 0 - 5 RBC/hpf   Bacteria, UA MANY (A) NONE SEEN  TSH     Status: None   Collection Time: 06/04/16  2:17 PM  Result Value Ref Range   TSH 0.788 0.350 - 4.500 uIU/mL  Troponin I     Status: None   Collection Time: 06/04/16  2:17 PM  Result Value Ref Range   Troponin I <0.03 <0.03 ng/mL  Urine  culture     Status: Abnormal   Collection Time: 06/04/16  3:01 PM  Result Value Ref Range   Specimen Description URINE, CLEAN CATCH    Special Requests Normal    Culture >=100,000 COLONIES/mL ESCHERICHIA COLI (A)    Report Status 06/06/2016 FINAL    Organism ID, Bacteria ESCHERICHIA COLI (A)       Susceptibility   Escherichia coli - MIC*    AMPICILLIN <=2 SENSITIVE Sensitive     CEFAZOLIN <=4 SENSITIVE Sensitive     CEFTRIAXONE <=1 SENSITIVE Sensitive     CIPROFLOXACIN <=0.25 SENSITIVE Sensitive     GENTAMICIN <=1 SENSITIVE Sensitive     IMIPENEM <=0.25 SENSITIVE Sensitive     NITROFURANTOIN <=16 SENSITIVE Sensitive     TRIMETH/SULFA <=20 SENSITIVE Sensitive     AMPICILLIN/SULBACTAM <=2 SENSITIVE Sensitive     PIP/TAZO <=4 SENSITIVE Sensitive     Extended ESBL NEGATIVE Sensitive     * >=100,000 COLONIES/mL ESCHERICHIA COLI  Troponin I     Status: None   Collection Time: 06/04/16  9:54 PM  Result Value Ref Range   Troponin I <0.03 <0.03 ng/mL  Troponin I     Status: None   Collection Time: 06/05/16  4:20 AM  Result Value Ref Range   Troponin I <0.03 <0.03 ng/mL  Basic metabolic panel     Status: Abnormal   Collection Time: 06/05/16  4:20 AM  Result Value Ref Range   Sodium 138 135 - 145 mmol/L   Potassium 3.9 3.5 - 5.1 mmol/L   Chloride 106 101 - 111 mmol/L   CO2 28 22 - 32 mmol/L   Glucose, Bld 107 (H) 65 - 99 mg/dL   BUN 10 6 - 20 mg/dL   Creatinine, Ser 0.88 0.44 - 1.00 mg/dL   Calcium 9.2 8.9 - 10.3 mg/dL   GFR calc non Af Amer 58 (L) >60 mL/min   GFR calc Af Amer >60 >60 mL/min    Comment: (NOTE) The eGFR has been calculated using the CKD EPI equation. This calculation has not been validated in all clinical situations. eGFR's persistently <60 mL/min signify possible Chronic Kidney Disease.    Anion gap 4 (L) 5 - 15  CBC     Status: None   Collection Time: 06/05/16  4:20 AM  Result Value Ref Range   WBC 8.7 4.0 - 10.5 K/uL   RBC 4.38 3.87 - 5.11 MIL/uL    Hemoglobin 13.6 12.0 - 15.0 g/dL   HCT 41.8 36.0 - 46.0 %   MCV 95.4 78.0 - 100.0 fL   MCH 31.1 26.0 - 34.0 pg   MCHC 32.5 30.0 - 36.0 g/dL   RDW 14.0 11.5 - 15.5 %   Platelets 178 150 - 400 K/uL  Hepatic function panel     Status: Abnormal   Collection Time: 06/05/16 11:39 AM  Result Value Ref Range   Total Protein 6.1 (L) 6.5 - 8.1  g/dL   Albumin 3.2 (L) 3.5 - 5.0 g/dL   AST 558 (H) 15 - 41 U/L   ALT 329 (H) 14 - 54 U/L   Alkaline Phosphatase 159 (H) 38 - 126 U/L   Total Bilirubin 2.2 (H) 0.3 - 1.2 mg/dL   Bilirubin, Direct 1.2 (H) 0.1 - 0.5 mg/dL   Indirect Bilirubin 1.0 (H) 0.3 - 0.9 mg/dL  Lipase, blood     Status: Abnormal   Collection Time: 06/05/16 11:39 AM  Result Value Ref Range   Lipase 78 (H) 11 - 51 U/L  Hepatitis panel, acute     Status: None   Collection Time: 06/05/16  4:59 PM  Result Value Ref Range   Hepatitis B Surface Ag Negative Negative   HCV Ab <0.1 0.0 - 0.9 s/co ratio    Comment: (NOTE)                                  Negative:     < 0.8                             Indeterminate: 0.8 - 0.9                                  Positive:     > 0.9 The CDC recommends that a positive HCV antibody result be followed up with a HCV Nucleic Acid Amplification test (161096). Performed At: [facility] [place], [place] [id number] Lindon Romp MD BJ:[id number]    Hep A IgM Negative Negative   Hep B C IgM Negative Negative  Comprehensive metabolic panel     Status: Abnormal   Collection Time: 06/06/16  5:36 AM  Result Value Ref Range   Sodium 136 135 - 145 mmol/L   Potassium 3.7 3.5 - 5.1 mmol/L   Chloride 108 101 - 111 mmol/L   CO2 24 22 - 32 mmol/L   Glucose, Bld 98 65 - 99 mg/dL   BUN 10 6 - 20 mg/dL   Creatinine, Ser 0.80 0.44 - 1.00 mg/dL   Calcium 8.8 (L) 8.9 - 10.3 mg/dL   Total Protein 5.5 (L) 6.5 - 8.1 g/dL   Albumin 2.8 (L) 3.5 - 5.0 g/dL   AST 232 (H) 15 - 41 U/L   ALT 233 (H) 14 - 54 U/L   Alkaline Phosphatase 155  (H) 38 - 126 U/L   Total Bilirubin 3.6 (H) 0.3 - 1.2 mg/dL   GFR calc non Af Amer >60 >60 mL/min   GFR calc Af Amer >60 >60 mL/min    Comment: (NOTE) The eGFR has been calculated using the CKD EPI equation. This calculation has not been validated in all clinical situations. eGFR's persistently <60 mL/min signify possible Chronic Kidney Disease.    Anion gap 4 (L) 5 - 15  Lipase, blood     Status: None   Collection Time: 06/06/16  5:36 AM  Result Value Ref Range   Lipase 28 11 - 51 U/L   Nm Myocar Multi W/spect W/wall Motion / Ef  Result Date: 06/05/2016 CLINICAL DATA:  82 year old female with chest pain. EXAM: MYOCARDIAL IMAGING WITH SPECT (REST AND PHARMACOLOGIC-STRESS) GATED LEFT VENTRICULAR WALL MOTION STUDY LEFT VENTRICULAR EJECTION FRACTION TECHNIQUE: Standard myocardial SPECT imaging was performed after resting intravenous injection  of 10 mCi Tc-69mtetrofosmin. Subsequently, intravenous infusion of Lexiscan was performed under the supervision of the Cardiology staff. At peak effect of the drug, 30 mCi Tc-967metrofosmin was injected intravenously and standard myocardial SPECT imaging was performed. Quantitative gated imaging was also performed to evaluate left ventricular wall motion, and estimate left ventricular ejection fraction. COMPARISON:  None. FINDINGS: Perfusion: No decreased activity in the left ventricle on stress imaging to suggest reversible ischemia or infarction. Wall Motion: Septal hypokinesis noted. No left ventricular dilatation. Left Ventricular Ejection Fraction: 69 % End diastolic volume 74 ml End systolic volume 23 ml IMPRESSION: 1. No reversible ischemia or infarction. 2. Septal hypokinesis without other regional left ventricular wall abnormality. 3. Left ventricular ejection fraction 69% 4. Non invasive risk stratification*: Low *2012 Appropriate Use Criteria for Coronary Revascularization Focused Update: J Am Coll Cardiol. 209747;18(5):501-586 [email or web address]?[email or web address] Electronically Signed   By: JeMargarette Canada.D.   On: 06/05/2016 15:59   UsKoreabdomen Limited Ruq  Result Date: 06/05/2016 CLINICAL DATA:  Abnormal transaminases, history hypertension EXAM: US[place]oreaBDOMEN LIMITED - RIGHT UPPER QUADRANT COMPARISON:  CT abdomen 05/19/2011 FINDINGS: Gallbladder: Surgically absent Common bile duct: Diameter: 23 mm diameter, unchanged Liver: Upper normal echogenicity. Intrahepatic biliary dilatation. Hepatopetal portal venous flow. No discrete hepatic mass or nodularity. No RIGHT upper quadrant free fluid. 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 No new abnormalities. Electronically Signed   By: MaLavonia Dana.D.   On: 06/05/2016 18:23    Assessment: Elevated liver function tests with chronic bile duct dilatation, unclear whether any biliary obstruction or postcholecystectomy variant Plan:  We'll obtain MRCP for better evaluation of the biliary system. We'll follow with you. Tracee Mccreery C 06/06/2016, 10:17 AM  Pager [phone] no answer or after 5 PM call [phone]

## 2016-06-06 NOTE — Progress Notes (Signed)
Subjective: No CP  No SOB   Objective: Vitals:   06/05/16 1455 06/05/16 2057 06/05/16 2209 06/06/16 0519  BP: 125/64 (!) 151/65  (!) 147/72  Pulse:  69  66  Resp:  15 17 16   Temp:  97.8 F (36.6 C)  98.4 F (36.9 C)  TempSrc:  Oral  Oral  SpO2:  95%  96%  Weight:    110 lb 1.6 oz (49.9 kg)  Height:    5\' 1"  (1.549 m)   Weight change: 2 lb 1.6 oz (0.953 kg)  Intake/Output Summary (Last 24 hours) at 06/06/16 0938 Last data filed at 06/06/16 0300  Gross per 24 hour  Intake           776.67 ml  Output              200 ml  Net           576.67 ml    General: Alert, awake, oriented x3, in no acute distress Neck:  JVP is normal Heart: Regular rate and rhythm Gr II/VI systolic murmur base  No  rubs, gallops.  Lungs: Clear to auscultation.  No rales or wheezes. Exemities:  No edema.   Neuro: Grossly intact, nonfocal.   Lab Results: Results for orders placed or performed during the hospital encounter of 06/04/16 (from the past 24 hour(s))  Hepatic function panel     Status: Abnormal   Collection Time: 06/05/16 11:39 AM  Result Value Ref Range   Total Protein 6.1 (L) 6.5 - 8.1 g/dL   Albumin 3.2 (L) 3.5 - 5.0 g/dL   AST 558 (H) 15 - 41 U/L   ALT 329 (H) 14 - 54 U/L   Alkaline Phosphatase 159 (H) 38 - 126 U/L   Total Bilirubin 2.2 (H) 0.3 - 1.2 mg/dL   Bilirubin, Direct 1.2 (H) 0.1 - 0.5 mg/dL   Indirect Bilirubin 1.0 (H) 0.3 - 0.9 mg/dL  Lipase, blood     Status: Abnormal   Collection Time: 06/05/16 11:39 AM  Result Value Ref Range   Lipase 78 (H) 11 - 51 U/L  Hepatitis panel, acute     Status: None   Collection Time: 06/05/16  4:59 PM  Result Value Ref Range   Hepatitis B Surface Ag Negative Negative   HCV Ab <0.1 0.0 - 0.9 s/co ratio   Hep A IgM Negative Negative   Hep B C IgM Negative Negative  Comprehensive metabolic panel     Status: Abnormal   Collection Time: 06/06/16  5:36 AM  Result Value Ref Range   Sodium 136 135 - 145 mmol/L   Potassium 3.7 3.5 - 5.1  mmol/L   Chloride 108 101 - 111 mmol/L   CO2 24 22 - 32 mmol/L   Glucose, Bld 98 65 - 99 mg/dL   BUN 10 6 - 20 mg/dL   Creatinine, Ser 0.80 0.44 - 1.00 mg/dL   Calcium 8.8 (L) 8.9 - 10.3 mg/dL   Total Protein 5.5 (L) 6.5 - 8.1 g/dL   Albumin 2.8 (L) 3.5 - 5.0 g/dL   AST 232 (H) 15 - 41 U/L   ALT 233 (H) 14 - 54 U/L   Alkaline Phosphatase 155 (H) 38 - 126 U/L   Total Bilirubin 3.6 (H) 0.3 - 1.2 mg/dL   GFR calc non Af Amer >60 >60 mL/min   GFR calc Af Amer >60 >60 mL/min   Anion gap 4 (L) 5 - 15  Lipase, blood  Status: None   Collection Time: 06/06/16  5:36 AM  Result Value Ref Range   Lipase 28 11 - 51 U/L    Studies/Results: Nm Myocar Multi W/spect W/wall Motion / Ef  Result Date: 06/05/2016 CLINICAL DATA:  82 year old female with chest pain. EXAM: MYOCARDIAL IMAGING WITH SPECT (REST AND PHARMACOLOGIC-STRESS) GATED LEFT VENTRICULAR WALL MOTION STUDY LEFT VENTRICULAR EJECTION FRACTION TECHNIQUE: Standard myocardial SPECT imaging was performed after resting intravenous injection of 10 mCi Tc-71m tetrofosmin. Subsequently, intravenous infusion of Lexiscan was performed under the supervision of the Cardiology staff. At peak effect of the drug, 30 mCi Tc-38m tetrofosmin was injected intravenously and standard myocardial SPECT imaging was performed. Quantitative gated imaging was also performed to evaluate left ventricular wall motion, and estimate left ventricular ejection fraction. COMPARISON:  None. FINDINGS: Perfusion: No decreased activity in the left ventricle on stress imaging to suggest reversible ischemia or infarction. Wall Motion: Septal hypokinesis noted. No left ventricular dilatation. Left Ventricular Ejection Fraction: 69 % End diastolic volume 74 ml End systolic volume 23 ml IMPRESSION: 1. No reversible ischemia or infarction. 2. Septal hypokinesis without other regional left ventricular wall abnormality. 3. Left ventricular ejection fraction 69% 4. Non invasive risk  stratification*: Low *2012 Appropriate Use Criteria for Coronary Revascularization Focused Update: J Am Coll Cardiol. N6492421. [email or web address]?[email or web address] Electronically Signed   By: Margarette Canada M.D.   On: 06/05/2016 15:59   US Abdomen Limited Ruq  Result Date: 06/05/2016 CLINICAL DATA:  Abnormal transaminases, history hypertension EXAM: US ABDOMEN LIMITED - RIGHT UPPER QUADRANT COMPARISON:  CT abdomen 05/19/2011 FINDINGS: Gallbladder: Surgically absent Common bile duct: Diameter: 23 mm diameter, unchanged Liver: Upper normal echogenicity. Intrahepatic biliary dilatation. Hepatopetal portal venous flow. No discrete hepatic mass or nodularity. No RIGHT upper quadrant free fluid. 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 No new abnormalities. Electronically Signed   By: Lavonia Dana M.D.   On: 06/05/2016 18:23    Medications:Reviewed   @PROBHOSP @  1  CP  Myoview yesterday showed normal perfusion  LVEF normal  CP does not appear to be due to signif coronary abnormality.  Keep on current medical Rx ? GI    2.  AS   Pt with mild AS on echo in May 2016  Mean gradie 10  LVEF normal Exam suggests that this has not changed  I do not think she needs to have f/u echo now.  Would follow clinicially with primary MD    Pt follow with Dr Laurann Montana as outpt.      LOS: 0 days   Dorris Carnes 06/06/2016, 9:38 AM

## 2016-06-06 NOTE — Progress Notes (Signed)
PROGRESS NOTE    Mackenzie Madden  [id number] DOB: 1928-07-09 DOA: 06/04/2016 PCP: Irven Shelling, MD    Brief Narrative: Mackenzie Madden  is a 82 y.o. female,With past medical history relevant for hypertension and hypothyroidism, otherwise healthy now presents With intermittent episodes of exertional retrosternal left-sided chest discomfort started this morning. Chest discomfort was associated with some nausea but no emesis, no diaphoresis, no dizziness and palpitations no significant shortness of breath no pleuritic symptoms no leg pains or leg swelling.   Assessment & Plan:   Active Problems:   Atypical chest pain   Chest pain  1-Chest Pain;  Troponin negative. EKG left BBB.  Myoview low risk. No further evaluation per cardiology  Started on metoprolol.   2-Nausea, chest pain; Transaminases I have ordered LFT and lipase which came back elevated.  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 IV fluids.  hepatitis panel negative.  Will consult GI, does she needs MRCP , ?  AST/ALT trending , bili 3.6.  3-UTI; Leukocytosis; UA growing E coli 100 K. Started ceftriaxone to cover also  for any intra -abdominal infection.  Ceftriaxone day 2.   4-Hypothyroidism- continue with levothyroxine. TSH; 0.788.  DVT prophylaxis: Heparin  Code Status: Full code.  Family Communication; multiples family member at bedside.  Disposition Plan: remain in hospital, probably home at time of discharge    Consultants:   cardiology   Procedures:   ECHO;  Stress test  US abdomen.   Antimicrobials:   Ceftriaxone 9-04   Subjective: She is feeling well this morning.  Denies nausea, chest pain. /   Objective: Vitals:   06/05/16 1455 06/05/16 2057 06/05/16 2209 06/06/16 0519  BP: 125/64 (!) 151/65  (!) 147/72  Pulse:  69  66  Resp:  15 17 16   Temp:  97.8 F (36.6 C)   98.4 F (36.9 C)  TempSrc:  Oral  Oral  SpO2:  95%  96%  Weight:    49.9 kg (110 lb 1.6 oz)  Height:    5\' 1"  (1.549 m)    Intake/Output Summary (Last 24 hours) at 06/06/16 0958 Last data filed at 06/06/16 0300  Gross per 24 hour  Intake           776.67 ml  Output              200 ml  Net           576.67 ml   Filed Weights   06/04/16 1158 06/05/16 0508 06/06/16 0519  Weight: 49.2 kg (108 lb 6.4 oz) 48.9 kg (107 lb 12.8 oz) 49.9 kg (110 lb 1.6 oz)    Examination:  General exam: Appears calm and comfortable  Respiratory system: Clear to auscultation. Respiratory effort normal. Cardiovascular system: S1 & S2 heard, RRR. No JVD, murmurs, rubs, gallops or clicks. No pedal edema. Gastrointestinal system: Abdomen is nondistended, soft and nontender. No organomegaly or masses felt. Normal bowel sounds heard. Central nervous system: Alert and oriented. No focal neurological deficits. Extremities: Symmetric 5 x 5 power. Skin: No rashes, lesions or ulcers Psychiatry: Judgement and insight appear normal. Mood & affect appropriate.     Data Reviewed: I have personally reviewed following labs and imaging studies  CBC:  Recent Labs Lab 06/04/16 0853 06/05/16 0420  WBC 12.9* 8.7  HGB 14.7 13.6  HCT 45.4 41.8  MCV 96.2 95.4  PLT 195 0000000   Basic Metabolic  Panel:  Recent Labs Lab 06/04/16 0853 06/05/16 0420 06/06/16 0536  NA 135 138 136  K 3.8 3.9 3.7  CL 104 106 108  CO2 27 28 24   GLUCOSE 94 107* 98  BUN 11 10 10   CREATININE 0.75 0.88 0.80  CALCIUM 9.3 9.2 8.8*   GFR: Estimated Creatinine Clearance: 38.1 mL/min (by C-G formula based on SCr of 0.8 mg/dL). Liver Function Tests:  Recent Labs Lab 06/05/16 1139 06/06/16 0536  AST 558* 232*  ALT 329* 233*  ALKPHOS 159* 155*  BILITOT 2.2* 3.6*  PROT 6.1* 5.5*  ALBUMIN 3.2* 2.8*    Recent Labs Lab 06/05/16 1139 06/06/16 0536  LIPASE 78* 28   No results for input(s): AMMONIA in the last 168  hours. Coagulation Profile: No results for input(s): INR, PROTIME in the last 168 hours. Cardiac Enzymes:  Recent Labs Lab 06/04/16 1417 06/04/16 2154 06/05/16 0420  TROPONINI <0.03 <0.03 <0.03   BNP (last 3 results) No results for input(s): PROBNP in the last 8760 hours. HbA1C: No results for input(s): HGBA1C in the last 72 hours. CBG: No results for input(s): GLUCAP in the last 168 hours. Lipid Profile: No results for input(s): CHOL, HDL, LDLCALC, TRIG, CHOLHDL, LDLDIRECT in the last 72 hours. Thyroid Function Tests:  Recent Labs  06/04/16 1417  TSH 0.788   Anemia Panel: No results for input(s): VITAMINB12, FOLATE, FERRITIN, TIBC, IRON, RETICCTPCT in the last 72 hours. Sepsis Labs: No results for input(s): PROCALCITON, LATICACIDVEN in the last 168 hours.  Recent Results (from the past 240 hour(s))  Urine culture     Status: Abnormal   Collection Time: 06/04/16  3:01 PM  Result Value Ref Range Status   Specimen Description URINE, CLEAN CATCH  Final   Special Requests Normal  Final   Culture >=100,000 COLONIES/mL ESCHERICHIA COLI (A)  Final   Report Status 06/06/2016 FINAL  Final   Organism ID, Bacteria ESCHERICHIA COLI (A)  Final      Susceptibility   Escherichia coli - MIC*    AMPICILLIN <=2 SENSITIVE Sensitive     CEFAZOLIN <=4 SENSITIVE Sensitive     CEFTRIAXONE <=1 SENSITIVE Sensitive     CIPROFLOXACIN <=0.25 SENSITIVE Sensitive     GENTAMICIN <=1 SENSITIVE Sensitive     IMIPENEM <=0.25 SENSITIVE Sensitive     NITROFURANTOIN <=16 SENSITIVE Sensitive     TRIMETH/SULFA <=20 SENSITIVE Sensitive     AMPICILLIN/SULBACTAM <=2 SENSITIVE Sensitive     PIP/TAZO <=4 SENSITIVE Sensitive     Extended ESBL NEGATIVE Sensitive     * >=100,000 COLONIES/mL ESCHERICHIA COLI         Radiology Studies: Nm Myocar Multi W/spect W/wall Motion / Ef  Result Date: 06/05/2016 CLINICAL DATA:  82 year old female with chest pain. EXAM: MYOCARDIAL IMAGING WITH SPECT (REST AND  PHARMACOLOGIC-STRESS) GATED LEFT VENTRICULAR WALL MOTION STUDY LEFT VENTRICULAR EJECTION FRACTION TECHNIQUE: Standard myocardial SPECT imaging was performed after resting intravenous injection of 10 mCi Tc-3m tetrofosmin. Subsequently, intravenous infusion of Lexiscan was performed under the supervision of the Cardiology staff. At peak effect of the drug, 30 mCi Tc-96m tetrofosmin was injected intravenously and standard myocardial SPECT imaging was performed. Quantitative gated imaging was also performed to evaluate left ventricular wall motion, and estimate left ventricular ejection fraction. COMPARISON:  None. FINDINGS: Perfusion: No decreased activity in the left ventricle on stress imaging to suggest reversible ischemia or infarction. Wall Motion: Septal hypokinesis noted. No left ventricular dilatation. Left Ventricular Ejection Fraction: 69 % End diastolic volume 74 ml  End systolic volume 23 ml IMPRESSION: 1. No reversible ischemia or infarction. 2. Septal hypokinesis without other regional left ventricular wall abnormality. 3. Left ventricular ejection fraction 69% 4. Non invasive risk stratification*: Low *2012 Appropriate Use Criteria for Coronary Revascularization Focused Update: J Am Coll Cardiol. [id number]. [email or web address] Electronically Signed   By: Margarette Canada M.D.   On: 06/05/2016 15:59   US Abdomen Limited Ruq  Result Date: 06/05/2016 CLINICAL DATA:  Abnormal transaminases, history hypertension EXAM: US ABDOMEN LIMITED - RIGHT UPPER QUADRANT COMPARISON:  CT abdomen 05/19/2011 FINDINGS: Gallbladder: Surgically absent Common bile duct: Diameter: 23 mm diameter, unchanged Liver: Upper normal echogenicity. Intrahepatic biliary dilatation. Hepatopetal portal venous flow. No discrete hepatic mass or nodularity. No RIGHT upper quadrant free fluid. 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 No new abnormalities. Electronically Signed   By: Lavonia Dana M.D.   On: 06/05/2016 18:23        Scheduled Meds: . amLODipine  5 mg Oral Daily  . brimonidine  1 drop Left Eye BID  . calcium-vitamin D  2 tablet Oral Daily  . cefTRIAXone (ROCEPHIN)  IV  1 g Intravenous Q24H  . famotidine (PEPCID) IV  20 mg Intravenous Q12H  . heparin  5,000 Units Subcutaneous Q8H  . latanoprost  1 drop Both Eyes QHS  . levothyroxine  100 mcg Oral QAC breakfast  . metoprolol tartrate  25 mg Oral BID  . multivitamin with minerals  1 tablet Oral Daily  . senna  1 tablet Oral BID  . sodium chloride flush  3 mL Intravenous Q12H  . sodium chloride flush  3 mL Intravenous Q12H  . timolol  1 drop Left Eye BID   Continuous Infusions: . sodium chloride 100 mL/hr at 06/06/16 0300     LOS: 0 days    Time spent: 35 minutes.     Elmarie Shiley, MD [facility] Pager [phone]  If 7PM-7AM, please contact night-coverage [email or web address] Password [email or web address] 06/06/2016, 9:58 AM

## 2016-06-07 ENCOUNTER — Inpatient Hospital Stay (HOSPITAL_COMMUNITY): Payer: Medicare Other

## 2016-06-07 DIAGNOSIS — R0789 Other chest pain: Principal | ICD-10-CM

## 2016-06-07 DIAGNOSIS — R740 Nonspecific elevation of levels of transaminase and lactic acid dehydrogenase [LDH]: Secondary | ICD-10-CM

## 2016-06-07 DIAGNOSIS — R748 Abnormal levels of other serum enzymes: Secondary | ICD-10-CM

## 2016-06-07 MED ORDER — SENNOSIDES-DOCUSATE SODIUM 8.6-50 MG PO TABS: 1.0000 | ORAL_TABLET | Freq: Every day | ORAL | 0 refills | Status: DC

## 2016-06-07 MED ORDER — AMOXICILLIN-POT CLAVULANATE 875-125 MG PO TABS: 1.0000 | ORAL_TABLET | Freq: Two times a day (BID) | ORAL | 0 refills | Status: DC

## 2016-06-07 MED ORDER — ASPIRIN EC 81 MG PO TBEC: 81.0000 mg | DELAYED_RELEASE_TABLET | Freq: Every day | ORAL | 0 refills | Status: DC

## 2016-06-07 MED ORDER — METOPROLOL TARTRATE 25 MG PO TABS: 25.0000 mg | ORAL_TABLET | Freq: Two times a day (BID) | ORAL | 0 refills | Status: DC

## 2016-06-07 NOTE — Discharge Summary (Signed)
Discharge Summary  QUINA Madden [id number] DOB: [birth year]  PCP: Irven Shelling, MD  Admit date: 06/04/2016 Discharge date: 06/07/2016  Time spent: <46mins  Recommendations for Outpatient Follow-up:  1. F/u with PMD within a week  for hospital discharge follow up, repeat cbc/cmp at follow up, pmd to monitor ?? Questionable spleen lesions 2. [facility] follow up  Discharge Diagnoses:  Active Hospital Problems   Diagnosis Date Noted  . Abnormal transaminases 06/06/2016  . Atypical chest pain 06/04/2016  . Chest pain 06/04/2016    Resolved Hospital Problems   Diagnosis Date Noted Date Resolved  No resolved problems to display.    Discharge Condition: stable  Diet recommendation: heart healthy  Filed Weights   06/05/16 0508 06/06/16 0519 06/07/16 0500  Weight: 48.9 kg (107 lb 12.8 oz) 49.9 kg (110 lb 1.6 oz) 49.2 kg (108 lb 8 oz)    History of present illness:  Mackenzie Madden  is a 82 y.o. female,With past medical history relevant for hypertension and hypothyroidism, otherwise healthy now presents With intermittent episodes of exertional retrosternal left-sided chest discomfort started this morning. Chest discomfort was associated with some nausea but no emesis, no diaphoresis, no dizziness and palpitations no significant shortness of breath no pleuritic symptoms no leg pains or leg swelling. Husband is at bedside, questions answered. She was apparently getting around in the house trying to get ready for church when intermittent chest pains started with activity. It was relieved by nitroglycerin and rest.  she took aspirin a home prior to arrival to the ED. No fever chills or productive cough  Hospital Course:  Active Problems:   Atypical chest pain   Chest pain   Abnormal transaminases  1-Chest Pain;  Troponin negative. EKG left BBB.  Myoview low risk. No further evaluation per cardiology  Started on metoprolol. 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 MRCP abdomen no acute findings hepatitis panel negative.  Patient did report she has substernal chest pain radiated to her back initially, she reported she has fried fish the night before, possibly diet related> [facility] consulted, recommended medical management and outpatient follow up  AST/ALT trending down. Lipase normalized  3-UTI; Leukocytosis; UA growing pan sensitive E coli 100 K. Started ceftriaxone to cover also  for any intra -abdominal infection.  Ceftriaxone x 2 days in the hospital Discharge on augmentin for three days to finish total 5 days treatment  4-Hypothyroidism- continue with levothyroxine. TSH; 0.788.  5. HTN; started on lopressor, d/c norvasc  Incidental finding by ERCP: Abnormal appearance the spleen with somewhat heterogeneous in T2 hyperintense spleen which could represent an underlying splenic mass. This is not well characterized in the absence of IV contrast. PMD to follow up    DVT prophylaxis while in the hospital: Heparin  Code Status: Full code.  Family Communication; patient and husband at bedside.  Disposition Plan:  home at time of discharge      Procedures:  MRCP on 9/5  Cardiac stress test on 9/4  Consultations:  Cardiology  [facility]  Discharge Exam: BP (!) 122/57 (BP Location: Left Arm)   Pulse 60   Temp 98.3 F (36.8 C) (Oral)   Resp 18   Ht 5\' 1"  (1.549 m)   Wt 49.2 kg (108 lb 8 oz)   SpO2 95%   BMI  20.50 kg/m   General: NAD Cardiovascular: RRR Respiratory: CTABL  Discharge Instructions You were cared for by a hospitalist during your hospital stay. If you have any questions about your discharge medications or the care you received while you were in the hospital after you are discharged, you can call the unit and asked to speak  with the hospitalist on call if the hospitalist that took care of you is not available. Once you are discharged, your primary care physician will handle any further medical issues. Please note that NO REFILLS for any discharge medications will be authorized once you are discharged, as it is imperative that you return to your primary care physician (or establish a relationship with a primary care physician if you do not have one) for your aftercare needs so that they can reassess your need for medications and monitor your lab values.  Discharge Instructions    Diet - low sodium heart healthy    Complete by:  As directed   Low fat diet   Increase activity slowly    Complete by:  As directed       Medication List    STOP taking these medications   amLODipine 5 MG tablet Commonly known as:  NORVASC   aspirin 325 MG tablet Replaced by:  aspirin EC 81 MG tablet     TAKE these medications   amoxicillin-clavulanate 875-125 MG tablet Commonly known as:  AUGMENTIN Take 1 tablet by mouth 2 (two) times daily.   aspirin EC 81 MG tablet Take 1 tablet (81 mg total) by mouth daily. Replaces:  aspirin 325 MG tablet   brimonidine-timolol 0.2-0.5 % ophthalmic solution Commonly known as:  COMBIGAN Place 1 drop into the left eye every 12 (twelve) hours.   CALCIUM 600 + D PO Take 1,200 mg by mouth at bedtime.   latanoprost 0.005 % ophthalmic solution Commonly known as:  XALATAN Place 1 drop into both eyes at bedtime.   levothyroxine 100 MCG tablet Commonly known as:  SYNTHROID, LEVOTHROID Take 100 mcg by mouth daily before breakfast.   metoprolol tartrate 25 MG tablet Commonly known as:  LOPRESSOR Take 1 tablet (25 mg total) by mouth 2 (two) times daily.   multivitamin with minerals Tabs tablet Take 1 tablet by mouth daily.   naproxen sodium 220 MG tablet Commonly known as:  ANAPROX Take 220 mg by mouth daily as needed (pain). ALEVE   senna-docusate 8.6-50 MG tablet Commonly known  as:  Senokot-S Take 1 tablet by mouth at bedtime.      No Known Allergies Follow-up Information    Irven Shelling, MD Follow up in 1 week(s).   Specialty:  Internal Medicine Why:  hospital discharge follow up, repeat cbc/cmp at follow up, pmd to monitor liver function.  pmd to follow up on ??spleen lesions please bring in your blood pressure record to your pmd for blood pressure monitoring Contact information: [address] [place] [address] [phone]        Missy Sabins, MD .   Specialty:  Gastroenterology Why:  as needed for liver function abnormalities Contact information: [address] [place] [address] [phone]            The results of significant diagnostics from this hospitalization (including imaging, microbiology, ancillary and laboratory) are listed below for reference.    Significant Diagnostic Studies: Dg Chest 2 View  Result Date: 06/04/2016 CLINICAL DATA:  82 year old female with a history of nausea and feeling  hot EXAM: CHEST  2 VIEW COMPARISON:  02/21/2015, 10/23/2013 FINDINGS: Cardiomediastinal silhouette unchanged. Calcifications of the aortic arch and descending thoracic aorta. Stigmata of emphysema, with increased retrosternal airspace, flattened hemidiaphragms, increased AP diameter, and hyperinflation on the AP view. No confluent airspace disease or pleural effusion.  No pneumothorax. Coarsened interstitial markings are similar in appearance to comparison studies. Osteopenia. No displaced fracture. IMPRESSION: Chronic lung changes and emphysema with no evidence of superimposed acute cardiopulmonary disease. Aortic atherosclerosis Signed, Dulcy Fanny. Earleen Newport, DO [facility] Electronically Signed   By: Corrie Mckusick D.O.   On: 06/04/2016 09:09   Mr Abdomen Mrcp Wo Cm  Result Date: 06/06/2016 CLINICAL DATA:  Elevated liver function tests and bili  ribbon. Chronic intrahepatic and extrahepatic biliary dilatation. EXAM: MRI ABDOMEN WITHOUT CONTRAST  (INCLUDING MRCP) TECHNIQUE: Multiplanar multisequence MR imaging of the abdomen was performed. Heavily T2-weighted images of the biliary and pancreatic ducts were obtained, and three-dimensional MRCP images were rendered by post processing. COMPARISON:  Multiple exams, including CT abdomen from 05/19/2011 FINDINGS: Lower chest:  Trace left pleural effusion. Hepatobiliary: Considerable intrahepatic and extrahepatic biliary dilatation noted, with some mild beading/ varicoid appearance of the intrahepatic biliary tree especially in the left hepatic lobe. Common hepatic duct 2.5 cm in diameter, common bile duct 2.5 cm in diameter. Slightly blunt distal conical tapering of the CBD common not unlike that of 05/19/2011, without an obvious filling defect or discrete ampullary mass. The dorsal pancreatic duct skirts the margin of the prominent CBD to also extend into the ampullary region. I do not see an obvious mass lesion of the ampulla, and the fact that the appearance is fairly similar to that of 2012 argues against an ampullary malignancy. Presumably there could be some sort of chronic benign stricturing in the ampulla as a contributing factor to the chronic biliary dilatation. There is no associated dilatation of dorsal pancreatic duct. I do not observe a focal liver lesion on today's noncontrast assessment. Gallbladder surgically absent. Pancreas: Unremarkable Spleen: Chronically abnormal appearance of the spleen, with masslike T2 hyperintense replacement of much of the small spleen. The patient has had splenic hematoma in the past and we may be observing a chronic residua mouth the hematoma. Without contrast this is difficult to assess further. Adrenals/Urinary Tract: Adrenal glands unremarkable. Fluid signal intensity cyst of the right kidney lower pole. Therefore T1 hyperintense lesions of the left kidney which are  likely complex cysts, generally after under 1 cm in diameter. 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 Vascular/Lymphatic: Atheromatous abdominal aorta. No adenopathy in the upper abdomen. Other: No supplemental non-categorized findings. Musculoskeletal: Mild degenerative endplate findings in the lumbar spine. IMPRESSION: 1. Chronic marked intrahepatic and extrahepatic biliary dilatation, with prior cholecystectomy. The distal CBD is slightly blunted but without an obvious filling defect in this appearance is fairly similar back through 2012. 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 There is no dilatation of the dorsal pancreatic duct which also exits at the ampulla. 2. Abnormal appearance the spleen with somewhat heterogeneous in T2 hyperintense spleen which could represent an underlying splenic mass. This is not well characterized in the absence of IV contrast. 3. There are multiple complex lesions of the left kidney will which are likely small benign complex cysts, generally under 1 cm in diameter, but technically nonspecific. 4. 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 5. No focal liver lesion identified. 6. Trace left pleural effusion. Electronically Signed   By: Cindra Eves.D.  On: 06/06/2016 22:22   Mr 3d Recon At Scanner  Result Date: 06/06/2016 CLINICAL DATA:  Elevated liver function tests and bili ribbon. Chronic intrahepatic and extrahepatic biliary dilatation. EXAM: MRI ABDOMEN WITHOUT CONTRAST  (INCLUDING MRCP) TECHNIQUE: Multiplanar multisequence MR imaging of the abdomen was performed. Heavily T2-weighted images of the biliary and pancreatic ducts were obtained, and three-dimensional MRCP images were rendered by post processing. COMPARISON:  Multiple exams, including CT abdomen from 05/19/2011 FINDINGS: Lower chest:  Trace left pleural effusion. Hepatobiliary: Considerable intrahepatic and extrahepatic biliary dilatation noted, with some mild beading/  varicoid appearance of the intrahepatic biliary tree especially in the left hepatic lobe. Common hepatic duct 2.5 cm in diameter, common bile duct 2.5 cm in diameter. Slightly blunt distal conical tapering of the CBD common not unlike that of 05/19/2011, without an obvious filling defect or discrete ampullary mass. The dorsal pancreatic duct skirts the margin of the prominent CBD to also extend into the ampullary region. I do not see an obvious mass lesion of the ampulla, and the fact that the appearance is fairly similar to that of 2012 argues against an ampullary malignancy. Presumably there could be some sort of chronic benign stricturing in the ampulla as a contributing factor to the chronic biliary dilatation. There is no associated dilatation of dorsal pancreatic duct. I do not observe a focal liver lesion on today's noncontrast assessment. Gallbladder surgically absent. Pancreas: Unremarkable Spleen: Chronically abnormal appearance of the spleen, with masslike T2 hyperintense replacement of much of the small spleen. The patient has had splenic hematoma in the past and we may be observing a chronic residua mouth the hematoma. Without contrast this is difficult to assess further. Adrenals/Urinary Tract: Adrenal glands unremarkable. Fluid signal intensity cyst of the right kidney lower pole. Therefore T1 hyperintense lesions of the left kidney which are likely complex cysts, generally after under 1 cm in diameter. 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 Vascular/Lymphatic: Atheromatous abdominal aorta. No adenopathy in the upper abdomen. Other: No supplemental non-categorized findings. Musculoskeletal: Mild degenerative endplate findings in the lumbar spine. IMPRESSION: 1. Chronic marked intrahepatic and extrahepatic biliary dilatation, with prior cholecystectomy. The distal CBD is slightly blunted but without an obvious filling defect in this appearance is fairly similar back through 2012. 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 There is no dilatation of the dorsal pancreatic duct which also exits at the ampulla. 2. Abnormal appearance the spleen with somewhat heterogeneous in T2 hyperintense spleen which could represent an underlying splenic mass. This is not well characterized in the absence of IV contrast. 3. There are multiple complex lesions of the left kidney will which are likely small benign complex cysts, generally under 1 cm in diameter, but technically nonspecific. 4. 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 5. No focal liver lesion identified. 6. Trace left pleural effusion. Electronically Signed   By: Van Clines M.D.   On: 06/06/2016 22:22   Nm Myocar Multi W/spect W/wall Motion / Ef  Result Date: 06/05/2016 CLINICAL DATA:  82 year old female with chest pain. EXAM: MYOCARDIAL IMAGING WITH SPECT (REST AND PHARMACOLOGIC-STRESS) GATED LEFT VENTRICULAR WALL MOTION STUDY LEFT VENTRICULAR EJECTION FRACTION TECHNIQUE: Standard myocardial SPECT imaging was performed after resting intravenous injection of 10 mCi Tc-7m tetrofosmin. Subsequently, intravenous infusion of Lexiscan was performed under the supervision of the Cardiology staff. At peak effect of the drug, 30 mCi Tc-25m tetrofosmin was injected intravenously and standard myocardial SPECT imaging was performed. Quantitative gated imaging was also performed to evaluate left  ventricular wall motion, and estimate left ventricular ejection fraction. COMPARISON:  None. FINDINGS: Perfusion: No decreased activity in the left ventricle on stress imaging to suggest reversible ischemia or infarction. Wall Motion: Septal hypokinesis noted. No left ventricular dilatation. Left Ventricular Ejection Fraction: 69 % End diastolic volume 74 ml End systolic volume 23 ml IMPRESSION: 1. No reversible ischemia or infarction. 2. Septal hypokinesis without other regional left ventricular wall abnormality. 3. Left ventricular ejection  fraction 69% 4. Non invasive risk stratification*: Low *2012 Appropriate Use Criteria for Coronary Revascularization Focused Update: J Am Coll Cardiol. [id number]. [email or web address] Electronically Signed   By: Margarette Canada M.D.   On: 06/05/2016 15:59   US Abdomen Limited Ruq  Result Date: 06/05/2016 CLINICAL DATA:  Abnormal transaminases, history hypertension EXAM: US ABDOMEN LIMITED - RIGHT UPPER QUADRANT COMPARISON:  CT abdomen 05/19/2011 FINDINGS: Gallbladder: Surgically absent Common bile duct: Diameter: 23 mm diameter, unchanged Liver: Upper normal echogenicity. Intrahepatic biliary dilatation. Hepatopetal portal venous flow. No discrete hepatic mass or nodularity. No RIGHT upper quadrant free fluid. 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 No new abnormalities. Electronically Signed   By: Lavonia Dana M.D.   On: 06/05/2016 18:23    Microbiology: Recent Results (from the past 240 hour(s))  Urine culture     Status: Abnormal   Collection Time: 06/04/16  3:01 PM  Result Value Ref Range Status   Specimen Description URINE, CLEAN CATCH  Final   Special Requests Normal  Final   Culture (A)  Final    >=100,000 COLONIES/mL ESCHERICHIA COLI 30,000 COLONIES/mL GROUP B STREP(S.AGALACTIAE)ISOLATED    Report Status 06/06/2016 FINAL  Final   Organism ID, Bacteria ESCHERICHIA COLI (A)  Final      Susceptibility   Escherichia coli - MIC*    AMPICILLIN <=2 SENSITIVE Sensitive     CEFAZOLIN <=4 SENSITIVE Sensitive     CEFTRIAXONE <=1 SENSITIVE Sensitive     CIPROFLOXACIN <=0.25 SENSITIVE Sensitive     GENTAMICIN <=1 SENSITIVE Sensitive     IMIPENEM <=0.25 SENSITIVE Sensitive     NITROFURANTOIN <=16 SENSITIVE Sensitive     TRIMETH/SULFA <=20 SENSITIVE Sensitive     AMPICILLIN/SULBACTAM <=2 SENSITIVE Sensitive     PIP/TAZO <=4 SENSITIVE  Sensitive     Extended ESBL NEGATIVE Sensitive     * >=100,000 COLONIES/mL ESCHERICHIA COLI     Labs: Basic Metabolic Panel:  Recent Labs Lab 06/04/16 0853 06/05/16 0420 06/06/16 0536  NA 135 138 136  K 3.8 3.9 3.7  CL 104 106 108  CO2 27 28 24   GLUCOSE 94 107* 98  BUN 11 10 10   CREATININE 0.75 0.88 0.80  CALCIUM 9.3 9.2 8.8*   Liver Function Tests:  Recent Labs Lab 06/05/16 1139 06/06/16 0536  AST 558* 232*  ALT 329* 233*  ALKPHOS 159* 155*  BILITOT 2.2* 3.6*  PROT 6.1* 5.5*  ALBUMIN 3.2* 2.8*    Recent Labs Lab 06/05/16 1139 06/06/16 0536  LIPASE 78* 28   No results for input(s): AMMONIA in the last 168 hours. CBC:  Recent Labs Lab 06/04/16 0853 06/05/16 0420  WBC 12.9* 8.7  HGB 14.7 13.6  HCT 45.4 41.8  MCV 96.2 95.4  PLT 195 178   Cardiac Enzymes:  Recent Labs Lab 06/04/16 1417 06/04/16 2154 06/05/16 0420  TROPONINI <0.03 <0.03 <0.03   BNP: BNP (last 3 results) No results for input(s): BNP in the last 8760 hours.  ProBNP (last 3 results) No results for input(s): PROBNP in the last 8760 hours.  CBG: No results for input(s): GLUCAP in the last 168 hours.     SignedFlorencia Reasons MD, PhD  [facility] 06/07/2016, 11:32 AM

## 2016-06-07 NOTE — Progress Notes (Signed)
[facility] Progress Note  Subjective: Patient feels okay, no chest pain  Objective: Vital signs in last 24 hours: Temp:  [98.2 F (36.8 C)-98.7 F (37.1 C)] 98.3 F (36.8 C) (09/06 0500) Pulse Rate:  [60-80] 60 (09/06 0500) Resp:  [18-21] 18 (09/06 0500) BP: (122-173)/(55-66) 122/57 (09/06 0500) SpO2:  [93 %-98 %] 95 % (09/06 0500) Weight:  [49.2 kg (108 lb 8 oz)] 49.2 kg (108 lb 8 oz) (09/06 0500) Weight change: -0.726 kg (-1 lb 9.6 oz)   PE: Unchanged  Lab Results: No results found for this or any previous visit (from the past 24 hour(s)).  Studies/Results: Mr Abdomen Mrcp Wo Cm  Result Date: 06/06/2016 CLINICAL DATA:  Elevated liver function tests and bili ribbon. Chronic intrahepatic and extrahepatic biliary dilatation. EXAM: MRI ABDOMEN WITHOUT CONTRAST  (INCLUDING MRCP) TECHNIQUE: Multiplanar multisequence MR imaging of the abdomen was performed. Heavily T2-weighted images of the biliary and pancreatic ducts were obtained, and three-dimensional MRCP images were rendered by post processing. COMPARISON:  Multiple exams, including CT abdomen from 05/19/2011 FINDINGS: Lower chest:  Trace left pleural effusion. Hepatobiliary: Considerable intrahepatic and extrahepatic biliary dilatation noted, with some mild beading/ varicoid appearance of the intrahepatic biliary tree especially in the left hepatic lobe. Common hepatic duct 2.5 cm in diameter, common bile duct 2.5 cm in diameter. Slightly blunt distal conical tapering of the CBD common not unlike that of 05/19/2011, without an obvious filling defect or discrete ampullary mass. The dorsal pancreatic duct skirts the margin of the prominent CBD to also extend into the ampullary region. I do not see an obvious mass lesion of the ampulla, and the fact that the appearance is fairly similar to that of 2012 argues against an ampullary malignancy. Presumably there could be some sort of chronic benign stricturing in the ampulla as a  contributing factor to the chronic biliary dilatation. There is no associated dilatation of dorsal pancreatic duct. I do not observe a focal liver lesion on today's noncontrast assessment. Gallbladder surgically absent. Pancreas: Unremarkable Spleen: Chronically abnormal appearance of the spleen, with masslike T2 hyperintense replacement of much of the small spleen. The patient has had splenic hematoma in the past and we may be observing a chronic residua mouth the hematoma. Without contrast this is difficult to assess further. Adrenals/Urinary Tract: Adrenal glands unremarkable. Fluid signal intensity cyst of the right kidney lower pole. Therefore T1 hyperintense lesions of the left kidney which are likely complex cysts, generally after under 1 cm in diameter. 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 Vascular/Lymphatic: Atheromatous abdominal aorta. No adenopathy in the upper abdomen. Other: No supplemental non-categorized findings. Musculoskeletal: Mild degenerative endplate findings in the lumbar spine. IMPRESSION: 1. Chronic marked intrahepatic and extrahepatic biliary dilatation, with prior cholecystectomy. The distal CBD is slightly blunted but without an obvious filling defect in this appearance is fairly similar back through 2012. 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 There is no dilatation of the dorsal pancreatic duct which also exits at the ampulla. 2. Abnormal appearance the spleen with somewhat heterogeneous in T2 hyperintense spleen which could represent an underlying splenic mass. This is not well characterized in the absence of IV contrast. 3. There are multiple complex lesions of the left kidney will which are likely small benign complex cysts, generally under 1 cm in diameter, but technically nonspecific. 4. 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 5. No focal liver lesion identified. 6. Trace left pleural effusion. Electronically Signed   By: Cindra Eves.D.  On: 06/06/2016 22:22   Mr 3d Recon At Scanner  Result Date: 06/06/2016 CLINICAL DATA:  Elevated liver function tests and bili ribbon. Chronic intrahepatic and extrahepatic biliary dilatation. EXAM: MRI ABDOMEN WITHOUT CONTRAST  (INCLUDING MRCP) TECHNIQUE: Multiplanar multisequence MR imaging of the abdomen was performed. Heavily T2-weighted images of the biliary and pancreatic ducts were obtained, and three-dimensional MRCP images were rendered by post processing. COMPARISON:  Multiple exams, including CT abdomen from 05/19/2011 FINDINGS: Lower chest:  Trace left pleural effusion. Hepatobiliary: Considerable intrahepatic and extrahepatic biliary dilatation noted, with some mild beading/ varicoid appearance of the intrahepatic biliary tree especially in the left hepatic lobe. Common hepatic duct 2.5 cm in diameter, common bile duct 2.5 cm in diameter. Slightly blunt distal conical tapering of the CBD common not unlike that of 05/19/2011, without an obvious filling defect or discrete ampullary mass. The dorsal pancreatic duct skirts the margin of the prominent CBD to also extend into the ampullary region. I do not see an obvious mass lesion of the ampulla, and the fact that the appearance is fairly similar to that of 2012 argues against an ampullary malignancy. Presumably there could be some sort of chronic benign stricturing in the ampulla as a contributing factor to the chronic biliary dilatation. There is no associated dilatation of dorsal pancreatic duct. I do not observe a focal liver lesion on today's noncontrast assessment. Gallbladder surgically absent. Pancreas: Unremarkable Spleen: Chronically abnormal appearance of the spleen, with masslike T2 hyperintense replacement of much of the small spleen. The patient has had splenic hematoma in the past and we may be observing a chronic residua mouth the hematoma. Without contrast this is difficult to assess further. Adrenals/Urinary Tract:  Adrenal glands unremarkable. Fluid signal intensity cyst of the right kidney lower pole. Therefore T1 hyperintense lesions of the left kidney which are likely complex cysts, generally after under 1 cm in diameter. 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 Vascular/Lymphatic: Atheromatous abdominal aorta. No adenopathy in the upper abdomen. Other: No supplemental non-categorized findings. Musculoskeletal: Mild degenerative endplate findings in the lumbar spine. IMPRESSION: 1. Chronic marked intrahepatic and extrahepatic biliary dilatation, with prior cholecystectomy. The distal CBD is slightly blunted but without an obvious filling defect in this appearance is fairly similar back through 2012. 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 There is no dilatation of the dorsal pancreatic duct which also exits at the ampulla. 2. Abnormal appearance the spleen with somewhat heterogeneous in T2 hyperintense spleen which could represent an underlying splenic mass. This is not well characterized in the absence of IV contrast. 3. There are multiple complex lesions of the left kidney will which are likely small benign complex cysts, generally under 1 cm in diameter, but technically nonspecific. 4. 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 5. No focal liver lesion identified. 6. Trace left pleural effusion. Electronically Signed   By: Van Clines M.D.   On: 06/06/2016 22:22   Nm Myocar Multi W/spect W/wall Motion / Ef  Result Date: 06/05/2016 CLINICAL DATA:  82 year old female with chest pain. EXAM: MYOCARDIAL IMAGING WITH SPECT (REST AND PHARMACOLOGIC-STRESS) GATED LEFT VENTRICULAR WALL MOTION STUDY LEFT VENTRICULAR EJECTION FRACTION TECHNIQUE: Standard myocardial SPECT imaging was performed after resting intravenous injection of 10 mCi Tc-30m tetrofosmin. Subsequently, intravenous infusion of Lexiscan was performed under the supervision of the Cardiology staff. At peak effect of the  drug, 30 mCi Tc-5m tetrofosmin was injected intravenously and standard myocardial SPECT imaging was performed. Quantitative gated imaging was also performed to evaluate left  ventricular wall motion, and estimate left ventricular ejection fraction. COMPARISON:  None. FINDINGS: Perfusion: No decreased activity in the left ventricle on stress imaging to suggest reversible ischemia or infarction. Wall Motion: Septal hypokinesis noted. No left ventricular dilatation. Left Ventricular Ejection Fraction: 69 % End diastolic volume 74 ml End systolic volume 23 ml IMPRESSION: 1. No reversible ischemia or infarction. 2. Septal hypokinesis without other regional left ventricular wall abnormality. 3. Left ventricular ejection fraction 69% 4. Non invasive risk stratification*: Low *2012 Appropriate Use Criteria for Coronary Revascularization Focused Update: J Am Coll Cardiol. [id number]. [email or web address] Electronically Signed   By: Margarette Canada M.D.   On: 06/05/2016 15:59   US Abdomen Limited Ruq  Result Date: 06/05/2016 CLINICAL DATA:  Abnormal transaminases, history hypertension EXAM: US ABDOMEN LIMITED - RIGHT UPPER QUADRANT COMPARISON:  CT abdomen 05/19/2011 FINDINGS: Gallbladder: Surgically absent Common bile duct: Diameter: 23 mm diameter, unchanged Liver: Upper normal echogenicity. Intrahepatic biliary dilatation. Hepatopetal portal venous flow. No discrete hepatic mass or nodularity. No RIGHT upper quadrant free fluid. 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 No new abnormalities. Electronically Signed   By: Lavonia Dana M.D.   On: 06/05/2016 18:23      Assessment: Intra-and extrahepatic bile duct irregularity and dilatation, associated with elevated liver function tests, exact etiology unclear but stable and radiologic appearance over 5 years with no  obvious suspicious tumor, stone, or dominant stricture  Plan: Not clear if any further workup such as ERCP is warranted in the absence of progressive increases in liver function tests or symptomatology. She is concerned about some gradual weight loss she has had. I would recommend follow-up with her primary care physician to monitor LFTs and weight over time and refer for ERCP if felt indicated. Okay for discharge from GI standpoint. We'll sign off for now.    Braxton Weisbecker C 06/07/2016, 8:43 AM  Pager [phone] If no answer or after 5 PM call [phone]

## 2016-07-18 ENCOUNTER — Other Ambulatory Visit: Payer: Self-pay | Admitting: Internal Medicine

## 2016-07-18 DIAGNOSIS — R531 Weakness: Secondary | ICD-10-CM

## 2016-07-20 ENCOUNTER — Other Ambulatory Visit: Payer: Self-pay | Admitting: Internal Medicine

## 2016-07-20 DIAGNOSIS — R531 Weakness: Secondary | ICD-10-CM

## 2016-07-20 DIAGNOSIS — R29898 Other symptoms and signs involving the musculoskeletal system: Secondary | ICD-10-CM

## 2016-07-29 ENCOUNTER — Ambulatory Visit
Admission: RE | Admit: 2016-07-29 | Discharge: 2016-07-29 | Disposition: A | Discharge status: Home / Self Care | Payer: Medicare Other | Source: Ambulatory Visit | Attending: Internal Medicine | Admitting: Internal Medicine

## 2016-07-29 DIAGNOSIS — R29898 Other symptoms and signs involving the musculoskeletal system: Secondary | ICD-10-CM

## 2016-07-29 DIAGNOSIS — R531 Weakness: Secondary | ICD-10-CM

## 2016-07-29 DIAGNOSIS — R42 Dizziness and giddiness: Secondary | ICD-10-CM | POA: Diagnosis not present

## 2016-08-03 ENCOUNTER — Ambulatory Visit (HOSPITAL_COMMUNITY)
Admission: EM | Admit: 2016-08-03 | Discharge: 2016-08-03 | Disposition: A | Discharge status: Home / Self Care | Payer: Medicare Other | Attending: Internal Medicine | Admitting: Internal Medicine

## 2016-08-03 ENCOUNTER — Encounter (HOSPITAL_COMMUNITY): Payer: Self-pay | Admitting: Emergency Medicine

## 2016-08-03 ENCOUNTER — Ambulatory Visit (INDEPENDENT_AMBULATORY_CARE_PROVIDER_SITE_OTHER): Payer: Medicare Other

## 2016-08-03 DIAGNOSIS — S299XXA Unspecified injury of thorax, initial encounter: Secondary | ICD-10-CM | POA: Diagnosis not present

## 2016-08-03 DIAGNOSIS — S20211A Contusion of right front wall of thorax, initial encounter: Secondary | ICD-10-CM | POA: Diagnosis not present

## 2016-08-03 NOTE — ED Notes (Signed)
Hurts changing posititon, getting up and down from sitting or lying down

## 2016-08-03 NOTE — ED Provider Notes (Signed)
[place]    CSN: [id number] Arrival date & time: 08/03/16  1307     History   Chief Complaint Chief Complaint  Patient presents with  . Fall    HPI Mackenzie Madden is a 82 y.o. female. She fell 2 nights ago, she was walking somewhere and turned abruptly. She subsequently has had pain in a very focal area of her right lateral mid back, this is worse with cough/deep breath/sneeze, and also with motion/change in position. No weakness or clumsiness in her legs. No change in bowels or bladder. Able to move both arms at the shoulders freely. No neck pain, no midline posterior back pain.  Did not hit her head, no loss of consciousness.  Husband reports that she had a mild stroke in September. Workup at that time was negative, but no other explanation for symptoms came to light.  HPI  Past Medical History:  Diagnosis Date  . Hypertension   . Thyroid disease     Patient Active Problem List   Diagnosis Date Noted  . Abnormal transaminases 06/06/2016  . Atypical chest pain 06/04/2016  . Chest pain 06/04/2016  . Faintness   . Chronic diastolic heart failure ([place])   . Intraocular pressure increase   . Syncope 02/21/2015  . HTN (hypertension) 02/21/2015  . Hypothyroid 02/21/2015    Past Surgical History:  Procedure Laterality Date  . SPLENECTOMY, TOTAL       Home Medications    Prior to Admission medications   Medication Sig Start Date End Date Taking? Authorizing Provider  aspirin EC 81 MG tablet Take 1 tablet (81 mg total) by mouth daily. 06/07/16   Florencia Reasons, MD  brimonidine-timolol (COMBIGAN) 0.2-0.5 % ophthalmic solution Place 1 drop into the left eye every 12 (twelve) hours.    Historical Provider, MD  Calcium Carb-Cholecalciferol (CALCIUM 600 + D PO) Take 1,200 mg by mouth at bedtime.    Historical Provider, MD  latanoprost (XALATAN) 0.005 % ophthalmic solution Place 1 drop into both eyes at bedtime. 01/11/15   Historical Provider, MD  levothyroxine  (SYNTHROID, LEVOTHROID) 100 MCG tablet Take 100 mcg by mouth daily before breakfast.    Historical Provider, MD  metoprolol tartrate (LOPRESSOR) 25 MG tablet Take 1 tablet (25 mg total) by mouth 2 (two) times daily. 06/07/16   Florencia Reasons, MD  Multiple Vitamin (MULTIVITAMIN WITH MINERALS) TABS tablet Take 1 tablet by mouth daily.    Historical Provider, MD  naproxen sodium (ANAPROX) 220 MG tablet Take 220 mg by mouth daily as needed (pain). [place]    Historical Provider, MD  senna-docusate (SENOKOT-S) 8.6-50 MG tablet Take 1 tablet by mouth at bedtime. 06/07/16   Florencia Reasons, MD    Family History Family History  Problem Relation Age of Onset  . CAD Mother     Social History Social History  Substance Use Topics  . Smoking status: Never Smoker  . Smokeless tobacco: Never Used  . Alcohol use No     Allergies   Patient has no known allergies.   Review of Systems Review of Systems  All other systems reviewed and are negative.    Physical Exam Triage Vital Signs ED Triage Vitals  Enc Vitals Group     BP 08/03/16 1413 159/70     Pulse Rate 08/03/16 1413 70     Resp 08/03/16 1413 22     Temp 08/03/16 1413 98.7 F (37.1 C)     Temp Source 08/03/16 1413 Oral  SpO2 08/03/16 1413 100 %     Weight --      Height --      Pain Score 08/03/16 1411 10   Updated Vital Signs BP 159/70 (BP Location: Left Arm)   Pulse 70   Temp 98.7 F (37.1 C) (Oral)   Resp 22   SpO2 100%  Physical Exam  Constitutional: She is oriented to person, place, and time. No distress.  Alert, nicely groomed  HENT:  Head: Atraumatic.  Eyes:  Conjugate gaze, no eye redness/drainage  Neck: Neck supple.  Cardiovascular: Normal rate and regular rhythm.   Pulmonary/Chest: No respiratory distress. She has no wheezes. She has no rales.  Lungs clear, symmetric breath sounds  Abdominal: She exhibits no distension.  Musculoskeletal: Normal range of motion.  No leg swelling Focal tenderness of a lower posterior  right rib, no bruise, no focal swelling. Midline posterior percussion tenderness.  Neurological: She is alert and oriented to person, place, and time.  Walked into the urgent care independently, able to climb on/off exam table  Skin: Skin is warm and dry.  No cyanosis  Nursing note and vitals reviewed.    UC Treatments / Results   Procedures Procedures (including critical care time)  Final Clinical Impressions(s) / UC Diagnoses   Final diagnoses:  Rib contusion, right, initial encounter   Pain in right mid back seems most likely to be caused by a bruised rib or possibly cracked rib.  Xray did not show a severe abnormality.  Recheck or followup with your primary care provider if you get a fever >100.5 or new cough.  Anticipate that pain will gradually improve over the next few weeks.   Sherlene Shams, MD 08/08/16 1146

## 2016-08-03 NOTE — Discharge Instructions (Addendum)
Pain in right mid back seems most likely to be caused by a bruised rib or possibly cracked rib.  Xray did not show a severe abnormality.  Recheck or followup with your primary care provider if you get a fever >100.5 or new cough.  Anticipate that pain will gradually improve over the next few weeks.

## 2016-08-03 NOTE — ED Triage Notes (Signed)
Patient fell in the home.  Patient was headed one way, decided to do something else.  Body turned one way and feet didn't.  No loc.  Landed on right shoulder.  Right lower back pain, burn, stinging, "like a knife".  Right upper arm soreness.

## 2017-01-10 ENCOUNTER — Other Ambulatory Visit: Payer: Self-pay | Admitting: Internal Medicine

## 2017-01-10 ENCOUNTER — Ambulatory Visit
Admission: RE | Admit: 2017-01-10 | Discharge: 2017-01-10 | Disposition: A | Discharge status: Home / Self Care | Payer: Medicare Other | Source: Ambulatory Visit | Attending: Internal Medicine | Admitting: Internal Medicine

## 2017-01-10 DIAGNOSIS — R079 Chest pain, unspecified: Secondary | ICD-10-CM

## 2017-01-17 ENCOUNTER — Other Ambulatory Visit: Payer: Self-pay | Admitting: Dermatology

## 2017-01-26 ENCOUNTER — Ambulatory Visit
Admission: RE | Admit: 2017-01-26 | Discharge: 2017-01-26 | Disposition: A | Discharge status: Home / Self Care | Payer: Medicare Other | Source: Ambulatory Visit | Attending: Internal Medicine | Admitting: Internal Medicine

## 2017-01-26 ENCOUNTER — Other Ambulatory Visit: Payer: Self-pay | Admitting: Internal Medicine

## 2017-01-26 DIAGNOSIS — J189 Pneumonia, unspecified organism: Secondary | ICD-10-CM

## 2017-01-26 DIAGNOSIS — R091 Pleurisy: Secondary | ICD-10-CM

## 2017-01-26 DIAGNOSIS — J181 Lobar pneumonia, unspecified organism: Principal | ICD-10-CM

## 2018-08-11 ENCOUNTER — Emergency Department (HOSPITAL_COMMUNITY): Payer: Medicare Other

## 2018-08-11 ENCOUNTER — Encounter (HOSPITAL_COMMUNITY): Payer: Self-pay | Admitting: *Deleted

## 2018-08-11 ENCOUNTER — Other Ambulatory Visit: Payer: Self-pay

## 2018-08-11 ENCOUNTER — Observation Stay (HOSPITAL_COMMUNITY)
Admission: EM | Admit: 2018-08-11 | Discharge: 2018-08-16 | DRG: 552 | Disposition: A | Discharge status: Home Health | Payer: Medicare Other | Attending: Internal Medicine | Admitting: Internal Medicine

## 2018-08-11 DIAGNOSIS — I110 Hypertensive heart disease with heart failure: Secondary | ICD-10-CM | POA: Diagnosis present

## 2018-08-11 DIAGNOSIS — W19XXXA Unspecified fall, initial encounter: Secondary | ICD-10-CM | POA: Diagnosis not present

## 2018-08-11 DIAGNOSIS — Y9301 Activity, walking, marching and hiking: Secondary | ICD-10-CM | POA: Diagnosis present

## 2018-08-11 DIAGNOSIS — S32592A Other specified fracture of left pubis, initial encounter for closed fracture: Secondary | ICD-10-CM | POA: Diagnosis present

## 2018-08-11 DIAGNOSIS — Z79890 Hormone replacement therapy (postmenopausal): Secondary | ICD-10-CM | POA: Diagnosis not present

## 2018-08-11 DIAGNOSIS — E039 Hypothyroidism, unspecified: Secondary | ICD-10-CM | POA: Diagnosis present

## 2018-08-11 DIAGNOSIS — I5032 Chronic diastolic (congestive) heart failure: Secondary | ICD-10-CM | POA: Diagnosis present

## 2018-08-11 DIAGNOSIS — Z79899 Other long term (current) drug therapy: Secondary | ICD-10-CM

## 2018-08-11 DIAGNOSIS — W010XXA Fall on same level from slipping, tripping and stumbling without subsequent striking against object, initial encounter: Secondary | ICD-10-CM | POA: Diagnosis present

## 2018-08-11 DIAGNOSIS — Y92002 Bathroom of unspecified non-institutional (private) residence single-family (private) house as the place of occurrence of the external cause: Secondary | ICD-10-CM | POA: Diagnosis not present

## 2018-08-11 DIAGNOSIS — S32110A Nondisplaced Zone I fracture of sacrum, initial encounter for closed fracture: Principal | ICD-10-CM | POA: Diagnosis present

## 2018-08-11 DIAGNOSIS — Z9081 Acquired absence of spleen: Secondary | ICD-10-CM | POA: Diagnosis not present

## 2018-08-11 DIAGNOSIS — I10 Essential (primary) hypertension: Secondary | ICD-10-CM | POA: Diagnosis present

## 2018-08-11 DIAGNOSIS — S3210XA Unspecified fracture of sacrum, initial encounter for closed fracture: Secondary | ICD-10-CM | POA: Diagnosis not present

## 2018-08-11 DIAGNOSIS — N390 Urinary tract infection, site not specified: Secondary | ICD-10-CM | POA: Diagnosis not present

## 2018-08-11 DIAGNOSIS — D72829 Elevated white blood cell count, unspecified: Secondary | ICD-10-CM

## 2018-08-11 DIAGNOSIS — N9489 Other specified conditions associated with female genital organs and menstrual cycle: Secondary | ICD-10-CM | POA: Diagnosis present

## 2018-08-11 DIAGNOSIS — Z8249 Family history of ischemic heart disease and other diseases of the circulatory system: Secondary | ICD-10-CM

## 2018-08-11 DIAGNOSIS — S3723XA Laceration of bladder, initial encounter: Secondary | ICD-10-CM | POA: Insufficient documentation

## 2018-08-11 DIAGNOSIS — M545 Low back pain: Secondary | ICD-10-CM | POA: Diagnosis present

## 2018-08-11 DIAGNOSIS — S32599A Other specified fracture of unspecified pubis, initial encounter for closed fracture: Secondary | ICD-10-CM | POA: Insufficient documentation

## 2018-08-11 LAB — BASIC METABOLIC PANEL
ANION GAP: 4 — AB (ref 5–15)
BUN: 16 mg/dL (ref 8–23)
CALCIUM: 9.3 mg/dL (ref 8.9–10.3)
CO2: 28 mmol/L (ref 22–32)
Chloride: 101 mmol/L (ref 98–111)
Creatinine, Ser: 0.83 mg/dL (ref 0.44–1.00)
GFR calc Af Amer: >60 mL/min (ref >60)
GLUCOSE: 132 mg/dL — AB (ref 70–99)
POTASSIUM: 3.6 mmol/L (ref 3.5–5.1)
SODIUM: 133 mmol/L — AB (ref 135–145)

## 2018-08-11 LAB — CBC WITH DIFFERENTIAL/PLATELET
ABS IMMATURE GRANULOCYTES: 0.04 10*3/uL (ref 0.00–0.07)
BASOS PCT: 0 %
Basophils Absolute: 0 10*3/uL (ref 0.0–0.1)
EOS ABS: 0 10*3/uL (ref 0.0–0.5)
Eosinophils Relative: 0 %
HCT: 39.5 % (ref 36.0–46.0)
Hemoglobin: 13.2 g/dL (ref 12.0–15.0)
IMMATURE GRANULOCYTES: 0 %
Lymphocytes Relative: 14 %
Lymphs Abs: 1.6 10*3/uL (ref 0.7–4.0)
MCH: 31.6 pg (ref 26.0–34.0)
MCHC: 33.4 g/dL (ref 30.0–36.0)
MCV: 94.5 fL (ref 80.0–100.0)
MONOS PCT: 13 %
Monocytes Absolute: 1.5 10*3/uL — ABNORMAL HIGH (ref 0.1–1.0)
NEUTROS ABS: 8 10*3/uL — AB (ref 1.7–7.7)
Neutrophils Relative %: 73 %
PLATELETS: 142 10*3/uL — AB (ref 150–400)
RBC: 4.18 MIL/uL (ref 3.87–5.11)
RDW: 13.2 % (ref 11.5–15.5)
WBC: 11.1 10*3/uL — AB (ref 4.0–10.5)
nRBC: 0 % (ref 0.0–0.2)

## 2018-08-11 LAB — URINALYSIS, ROUTINE W REFLEX MICROSCOPIC
Bilirubin Urine: NEGATIVE
GLUCOSE, UA: NEGATIVE mg/dL
KETONES UR: NEGATIVE mg/dL
Nitrite: POSITIVE — AB
PH: 8 (ref 5.0–8.0)
PROTEIN: NEGATIVE mg/dL
Specific Gravity, Urine: 1.01 (ref 1.005–1.030)

## 2018-08-11 LAB — I-STAT CHEM 8, ED
BUN: 16 mg/dL (ref 8–23)
CALCIUM ION: 1.24 mmol/L (ref 1.15–1.40)
CREATININE: 0.9 mg/dL (ref 0.44–1.00)
Chloride: 102 mmol/L (ref 98–111)
GLUCOSE: 125 mg/dL — AB (ref 70–99)
HCT: 39 % (ref 36.0–46.0)
Hemoglobin: 13.3 g/dL (ref 12.0–15.0)
Potassium: 3.7 mmol/L (ref 3.5–5.1)
Sodium: 137 mmol/L (ref 135–145)
TCO2: 26 mmol/L (ref 22–32)

## 2018-08-11 MED ORDER — SENNOSIDES-DOCUSATE SODIUM 8.6-50 MG PO TABS
1.0000 | ORAL_TABLET | Freq: Every day | ORAL | Status: DC
  Administered 2018-08-12 – 2018-08-15 (×4): 1 via ORAL
  Filled 2018-08-11 (×5): qty 1

## 2018-08-11 MED ORDER — ACETAMINOPHEN 650 MG RE SUPP: 650.0000 mg | Freq: Four times a day (QID) | RECTAL | Status: DC | PRN

## 2018-08-11 MED ORDER — FENTANYL CITRATE (PF) 100 MCG/2ML IJ SOLN
50.0000 ug | Freq: Once | INTRAMUSCULAR | Status: AC
  Administered 2018-08-11: 50 ug via INTRAVENOUS
  Filled 2018-08-11: qty 2

## 2018-08-11 MED ORDER — SODIUM CHLORIDE 0.9 % IV SOLN
250.0000 mL | INTRAVENOUS | Status: DC | PRN
  Administered 2018-08-11: 10 mL via INTRAVENOUS

## 2018-08-11 MED ORDER — CEFTRIAXONE SODIUM 1 G IJ SOLR
1.0000 g | INTRAMUSCULAR | Status: DC
  Filled 2018-08-11: qty 10

## 2018-08-11 MED ORDER — ADULT MULTIVITAMIN W/MINERALS CH
1.0000 | ORAL_TABLET | Freq: Every day | ORAL | Status: DC
  Administered 2018-08-11 – 2018-08-16 (×6): 1 via ORAL
  Filled 2018-08-11 (×6): qty 1

## 2018-08-11 MED ORDER — SODIUM CHLORIDE 0.9 % IV SOLN
1.0000 g | INTRAVENOUS | Status: DC
  Administered 2018-08-12 – 2018-08-15 (×5): 1 g via INTRAVENOUS
  Filled 2018-08-11 (×6): qty 10

## 2018-08-11 MED ORDER — BRIMONIDINE TARTRATE 0.2 % OP SOLN
1.0000 [drp] | Freq: Three times a day (TID) | OPHTHALMIC | Status: DC
  Administered 2018-08-11 – 2018-08-15 (×5): 1 [drp] via OPHTHALMIC
  Filled 2018-08-11: qty 5

## 2018-08-11 MED ORDER — LATANOPROST 0.005 % OP SOLN
1.0000 [drp] | Freq: Every day | OPHTHALMIC | Status: DC
  Administered 2018-08-11 – 2018-08-15 (×5): 1 [drp] via OPHTHALMIC
  Filled 2018-08-11: qty 2.5

## 2018-08-11 MED ORDER — OXYCODONE HCL 5 MG PO TABS
5.0000 mg | ORAL_TABLET | ORAL | Status: DC | PRN
  Administered 2018-08-11 – 2018-08-16 (×5): 5 mg via ORAL
  Filled 2018-08-11 (×5): qty 1

## 2018-08-11 MED ORDER — ONDANSETRON HCL 4 MG/2ML IJ SOLN: 4.0000 mg | Freq: Four times a day (QID) | INTRAMUSCULAR | Status: DC | PRN

## 2018-08-11 MED ORDER — METOPROLOL TARTRATE 25 MG PO TABS
25.0000 mg | ORAL_TABLET | Freq: Two times a day (BID) | ORAL | Status: DC
  Administered 2018-08-12 – 2018-08-16 (×8): 25 mg via ORAL
  Filled 2018-08-11 (×10): qty 1

## 2018-08-11 MED ORDER — BRIMONIDINE TARTRATE-TIMOLOL 0.2-0.5 % OP SOLN: 1.0000 [drp] | Freq: Three times a day (TID) | OPHTHALMIC | Status: DC

## 2018-08-11 MED ORDER — HYDROMORPHONE HCL 1 MG/ML IJ SOLN: 0.5000 mg | INTRAMUSCULAR | Status: DC | PRN

## 2018-08-11 MED ORDER — LEVOTHYROXINE SODIUM 100 MCG PO TABS
100.0000 ug | ORAL_TABLET | Freq: Every day | ORAL | Status: DC
  Administered 2018-08-12 – 2018-08-16 (×5): 100 ug via ORAL
  Filled 2018-08-11 (×5): qty 1

## 2018-08-11 MED ORDER — SODIUM CHLORIDE 0.9% FLUSH: 3.0000 mL | INTRAVENOUS | Status: DC | PRN

## 2018-08-11 MED ORDER — TIMOLOL MALEATE 0.5 % OP SOLN
1.0000 [drp] | Freq: Three times a day (TID) | OPHTHALMIC | Status: DC
  Administered 2018-08-12 – 2018-08-16 (×9): 1 [drp] via OPHTHALMIC
  Filled 2018-08-11: qty 5

## 2018-08-11 MED ORDER — NAPROXEN SODIUM 220 MG PO TABS: 220.0000 mg | ORAL_TABLET | Freq: Every day | ORAL | Status: DC | PRN

## 2018-08-11 MED ORDER — ACETAMINOPHEN 325 MG PO TABS: 650.0000 mg | ORAL_TABLET | Freq: Four times a day (QID) | ORAL | Status: DC | PRN

## 2018-08-11 MED ORDER — SODIUM CHLORIDE 0.9% FLUSH
3.0000 mL | Freq: Two times a day (BID) | INTRAVENOUS | Status: DC
  Administered 2018-08-11 – 2018-08-16 (×10): 3 mL via INTRAVENOUS

## 2018-08-11 NOTE — ED Notes (Signed)
Patient transported to X-ray 

## 2018-08-11 NOTE — ED Provider Notes (Signed)
[facility] EMERGENCY DEPARTMENT Provider Note   CSN: [id number] Arrival date & time: 08/11/18  2297     History   Chief Complaint Chief Complaint  Patient presents with  . Fall    HPI Mackenzie Madden is a 84 y.o. female.  Patient is a 84 year old female who presents after a fall.  She was walking to the bathroom during the night and states she lost her balance and fell backward, landing on her left hip.  She had no loss of consciousness.  She did not hit her head.  She is not on anticoagulants.  She denies any neck or back pain.  She denies any dizziness chest pain shortness of breath or other symptoms that led to the fall.  She states that she just lost her balance when she was walking.  She was able to get up and get back in the bed.  However this morning she has had difficulty bearing weight on the left leg.  It hurts to put weight on it.  She denies any other injuries from the fall.     Past Medical History:  Diagnosis Date  . Hypertension   . Thyroid disease     Patient Active Problem List   Diagnosis Date Noted  . Abnormal transaminases 06/06/2016  . Atypical chest pain 06/04/2016  . Chest pain 06/04/2016  . Faintness   . Chronic diastolic heart failure ([employer])   . Intraocular pressure increase   . Syncope 02/21/2015  . HTN (hypertension) 02/21/2015  . Hypothyroid 02/21/2015    Past Surgical History:  Procedure Laterality Date  . SPLENECTOMY, TOTAL       OB History   None      Home Medications    Prior to Admission medications   Medication Sig Start Date End Date Taking? Authorizing Provider  brimonidine-timolol (COMBIGAN) 0.2-0.5 % ophthalmic solution Place 1 drop into the left eye 3 (three) times daily.    Yes [provider]  Calcium Carb-Cholecalciferol (CALCIUM 600 + D PO) Take 1,200 mg by mouth at bedtime.   Yes [provider]  latanoprost (XALATAN) 0.005 % ophthalmic solution Place 1 drop into both eyes  at bedtime. 01/11/15  Yes [provider]  levothyroxine (SYNTHROID, LEVOTHROID) 100 MCG tablet Take 100 mcg by mouth daily before breakfast.   Yes [provider]  Multiple Vitamin (MULTIVITAMIN WITH MINERALS) TABS tablet Take 1 tablet by mouth daily.   Yes [provider]  naproxen sodium (ANAPROX) 220 MG tablet Take 220 mg by mouth daily as needed (pain). ALEVE   Yes [provider]  aspirin EC 81 MG tablet Take 1 tablet (81 mg total) by mouth daily. Patient not taking: Reported on 08/11/2018 06/07/16   Florencia Reasons, MD  metoprolol tartrate (LOPRESSOR) 25 MG tablet Take 1 tablet (25 mg total) by mouth 2 (two) times daily. Patient not taking: Reported on 08/11/2018 06/07/16   Florencia Reasons, MD  senna-docusate (SENOKOT-S) 8.6-50 MG tablet Take 1 tablet by mouth at bedtime. Patient not taking: Reported on 08/11/2018 06/07/16   Florencia Reasons, MD    Family History Family History  Problem Relation Age of Onset  . CAD Mother     Social History Social History   Tobacco Use  . Smoking status: Never Smoker  . Smokeless tobacco: Never Used  Substance Use Topics  . Alcohol use: No  . Drug use: No     Allergies   Patient has no known allergies.   Review  of Systems Review of Systems  Constitutional: Negative for chills, diaphoresis, fatigue and fever.  HENT: Negative for congestion, rhinorrhea and sneezing.   Eyes: Negative.   Respiratory: Negative for cough, chest tightness and shortness of breath.   Cardiovascular: Negative for chest pain and leg swelling.  Gastrointestinal: Negative for abdominal pain, blood in stool, diarrhea, nausea and vomiting.  Genitourinary: Negative for difficulty urinating, flank pain, frequency and hematuria.  Musculoskeletal: Positive for arthralgias. Negative for back pain.  Skin: Negative for rash.  Neurological: Negative for dizziness, speech difficulty, weakness, numbness and headaches.     Physical Exam Updated Vital Signs BP  (!) 169/81 (BP Location: Right Arm)   Pulse 90   Temp 98.9 F (37.2 C) (Oral)   Resp 16   Ht 5' (1.524 m)   Wt 44.5 kg   SpO2 94%   BMI 19.14 kg/m   Physical Exam  Constitutional: She is oriented to person, place, and time. She appears well-developed and well-nourished.  HENT:  Head: Normocephalic and atraumatic.  Eyes: Pupils are equal, round, and reactive to light.  Neck: Normal range of motion. Neck supple.  No pain along the cervical thoracic or lumbosacral spine  Cardiovascular: Normal rate, regular rhythm and normal heart sounds.  Pulmonary/Chest: Effort normal and breath sounds normal. No respiratory distress. She has no wheezes. She has no rales. She exhibits no tenderness.  No pain along the ribs  Abdominal: Soft. Bowel sounds are normal. There is no tenderness. There is no rebound and no guarding.  Musculoskeletal: Normal range of motion. She exhibits no edema.  Positive tenderness on range of motion of the left hip.  There is no deformity of the hip.  Pedal pulses are intact.  She has normal sensation and motor function distally.  There is no other pain on palpation or range of motion of the extremities  Lymphadenopathy:    She has no cervical adenopathy.  Neurological: She is alert and oriented to person, place, and time.  Skin: Skin is warm and dry. No rash noted.  Psychiatric: She has a normal mood and affect.     ED Treatments / Results  Labs (all labs ordered are listed, but only abnormal results are displayed) Labs Reviewed  BASIC METABOLIC PANEL - Abnormal; Notable for the following components:      Result Value   Sodium 133 (*)    Glucose, Bld 132 (*)    Anion gap 4 (*)    All other components within normal limits  CBC WITH DIFFERENTIAL/PLATELET - Abnormal; Notable for the following components:   WBC 11.1 (*)    Platelets 142 (*)    Neutro Abs 8.0 (*)    Monocytes Absolute 1.5 (*)    All other components within normal limits  I-STAT CHEM 8, ED -  Abnormal; Notable for the following components:   Glucose, Bld 125 (*)    All other components within normal limits  URINALYSIS, ROUTINE W REFLEX MICROSCOPIC    EKG None  Radiology Mr Pelvis Wo Contrast  Result Date: 08/11/2018 CLINICAL DATA:  Fall yesterday, left hip pain. EXAM: MRI PELVIS WITHOUT CONTRAST TECHNIQUE: Multiplanar multisequence MR imaging of the pelvis was performed. No intravenous contrast was administered. COMPARISON:  Radiographs from 08/11/2018 FINDINGS: Urinary Tract: There is a fluid level in the urinary bladder, suspicious for hematocrit level/blood in the urinary bladder. Bowel:  Mild sigmoid colon diverticulosis. Vascular/Lymphatic: Unremarkable Reproductive:  The uterus is absent.  Adnexa unremarkable. Other: Prominently dilated distal common bile duct  as shown on MRI from 06/06/2016. There could be a small stone in the distal CBD on image 3/7 measuring about 2 mm in diameter right kidney lower pole cyst. Musculoskeletal: Osseous: Acute nondisplaced fracture of the left sacral ala extending vertically. Subtle edema in both SI joints. Acute fractures of the left pubic body extending into the adjacent medial portion of the rami. No definite right hemi pelvic fractures are identified. The pubic fracture is associated with a 8.3 by 3.6 by 2.3 cm (volume = 36 cm^3) hematoma extending from the suprapubic region into the space of Retzius. Edema tracks along periureteral fascia planes. There is notable muscular edema within along the hip adductor musculature on the left. The left adductor aponeurosis attaches to the region of fracture in the left pubis, as does the left rectus abdominus aponeurosis. Edema tracks within along the left obturator internus muscle and left pelvic sidewall. IMPRESSION: 1. Acute fracture of the left pubis extending into the adjacent medial rami, and acute fracture of the left sacral ala. The pubic fracture is associated with a 36 cubic cm hematoma extending  in the space of Retzius and suprapubic region, as well as notable edema tracking within the hip adductor musculature, along the obturator internus, and along the left pelvic sidewall and periureteral fascia planes. 2. There is a fluid-level in the urinary bladder suggesting blood in the urinary bladder which in turn raises the possibility of a bladder injury. I do not see definite bladder wall irregularity to suggest a bladder rupture, but a low threshold for further bladder workup is suggested given the apparent hematocrit level. 3. Mild sigmoid colon diverticulosis. 4. Chronic dilated distal common bile duct. Questionable 2 mm stone in the distal CBD, not causing current obstruction. Electronically Signed   By: Van Clines M.D.   On: 08/11/2018 15:02   Dg Hip Unilat W Or Wo Pelvis 2-3 Views Left  Result Date: 08/11/2018 CLINICAL DATA:  84 year old female with left hip pain after falling yesterday EXAM: DG HIP (WITH OR WITHOUT PELVIS) 2-3V LEFT COMPARISON:  None. FINDINGS: Subtle lucency running obliquely across the parasymphyseal left superior pubic ramus. Suspect a nondisplaced fracture involving the left superior pubic ramus in the parasymphyseal region. There is a moderate bilateral hip joint degenerative osteoarthritis as evidenced by narrowing of the joint space and prominent osteophyte formation. No definite acute fracture of the left femoral neck although evaluation is somewhat limited by the degree of osteophyte formation. The remainder of the bony pelvis appears intact. Atherosclerotic calcifications visualized in the iliac and femoral vessels bilaterally. Unremarkable bowel gas pattern. IMPRESSION: 1. Suspect nondisplaced left parasymphyseal pubic ramus fracture. Recommend correlation for left inguinal/pubic tenderness to palpation. 2. No definite acute fracture of the left femur although evaluation is somewhat limited by the degree of osteophytes at the femoral head neck junction. If  clinical concern persists for left proximal femoral fracture, consider further evaluation with MRI of the pelvis. 3. Moderate bilateral hip joint osteoarthritis with prominent osteophyte formation. 4.  Aortic Atherosclerosis (ICD10-170.0). Electronically Signed   By: Jacqulynn Cadet M.D.   On: 08/11/2018 09:41    Procedures Procedures (including critical care time)  Medications Ordered in ED Medications  fentaNYL (SUBLIMAZE) injection 50 mcg (50 mcg Intravenous Given 08/11/18 0834)  fentaNYL (SUBLIMAZE) injection 50 mcg (50 mcg Intravenous Given 08/11/18 1337)     Initial Impression / Assessment and Plan / ED Course  I have reviewed the triage vital signs and the nursing notes.  Pertinent labs & imaging  results that were available during my care of the patient were reviewed by me and considered in my medical decision making (see chart for details).     Patient is a 84 year old female who presents after a fall.  X-rays reveal a possible pubic ramus fracture.  MRI was recommended.  MRI was performed which does show pubic ramus fracture with a hematoma as well as a sacral fracture.  There is concern for bladder injury as well.  I spoke with Dr. Alyson Ingles with urology who will see the patient and recommends Foley catheter placement.  I also spoke with the hospitalist who will admit the patient for further treatment and pain control.  Her labs are reviewed and are non-concerning.  Final Clinical Impressions(s) / ED Diagnoses   Final diagnoses:  Pubic ramus fracture, left, closed, initial encounter ([place])  Closed fracture of sacrum, unspecified fracture morphology, initial encounter [facility])  Fall, initial encounter    ED Discharge Orders    None       Malvin Johns, MD 08/11/18 [id number]

## 2018-08-11 NOTE — ED Triage Notes (Signed)
Patient states she was going to the bathroom approx. 12 mn and lost her balance and fell, patient's husband helped her up and she was able to ambulate at first this am pain was worse and was worse with weight bearing.

## 2018-08-11 NOTE — H&P (Signed)
[facility]                                                                                    Patient Demographics  Mackenzie Madden, is a 84 y.o. female  CSN: [id number]  MRN: [id number]  DOB - [birth year]  Admit Date - 08/11/2018  Outpatient Primary MD for the patient is Lavone Orn, MD   With History of -  Past Medical History:  Diagnosis Date  . Hypertension   . Thyroid disease       Past Surgical History:  Procedure Laterality Date  . SPLENECTOMY, TOTAL      in for   Chief Complaint  Patient presents with  . Fall     HPI  Mackenzie Madden  is a 84 y.o. female, with past medical history significant for hypertension and hypothyroidism presents status post fall at home yesterday in the bathroom during the night.  No preceding chest pains or palpitations, she just slipped.  Today the patient's started having lower back pain especially on the left side and she cannot bear weight on the left leg.  Work-up in the emergency room showed pelvic fracture with left pubic ramus fracture and a possible bladder injury.  Dr. Alyson Ingles from urology was consulted and is pending.    Review of Systems    In addition to the HPI above,  No Fever-chills, No Headache, No changes with Vision or hearing, No problems swallowing food or Liquids, No Chest pain, Cough or Shortness of Breath, No Abdominal pain, No Nausea or Vommitting, Bowel movements are regular, No Blood in stool or Urine, No dysuria, No new skin rashes or bruises, No recent weight gain or loss, No polyuria, polydypsia or polyphagia, No significant Mental Stressors.  A full 10 point Review of Systems was done, except as stated above, all other Review of Systems were negative.   Social History Social History   Tobacco Use  . Smoking status: Never Smoker  . Smokeless tobacco: Never Used  Substance Use Topics  . Alcohol use: No     Family History Family History  Problem Relation Age  of Onset  . CAD Mother      Prior to Admission medications   Medication Sig Start Date End Date Taking? Authorizing Provider  brimonidine-timolol (COMBIGAN) 0.2-0.5 % ophthalmic solution Place 1 drop into the left eye 3 (three) times daily.    Yes [provider]  Calcium Carb-Cholecalciferol (CALCIUM 600 + D PO) Take 1,200 mg by mouth at bedtime.   Yes [provider]  latanoprost (XALATAN) 0.005 % ophthalmic solution Place 1 drop into both eyes at bedtime. 01/11/15  Yes [provider]  levothyroxine (SYNTHROID, LEVOTHROID) 100 MCG tablet Take 100 mcg by mouth daily before breakfast.   Yes [provider]  Multiple Vitamin (MULTIVITAMIN WITH MINERALS) TABS tablet Take 1 tablet by mouth daily.   Yes [provider]  naproxen sodium (ANAPROX) 220 MG tablet Take 220 mg by mouth daily as needed (pain). ALEVE   Yes [provider]  aspirin EC 81 MG tablet Take 1 tablet (81 mg total) by mouth daily. Patient not taking: Reported on 08/11/2018 06/07/16  Florencia Reasons, MD  metoprolol tartrate (LOPRESSOR) 25 MG tablet Take 1 tablet (25 mg total) by mouth 2 (two) times daily. Patient not taking: Reported on 08/11/2018 06/07/16   Florencia Reasons, MD  senna-docusate (SENOKOT-S) 8.6-50 MG tablet Take 1 tablet by mouth at bedtime. Patient not taking: Reported on 08/11/2018 06/07/16   Florencia Reasons, MD    No Known Allergies  Physical Exam  Vitals  Blood pressure (!) 169/81, pulse 90, temperature 98.9 F (37.2 C), temperature source Oral, resp. rate 16, height 5' (1.524 m), weight 44.5 kg, SpO2 94 %.   1. General well-developed, well-nourished female, extremely pleasant  2. Normal affect and insight, Not Suicidal or Homicidal, Awake Alert, Oriented X 3.  3. No F.N deficits, grossly, patient able to move all extremities.  4. Ears and Eyes appear Normal, Conjunctivae clear, PERRLA. Moist Oral Mucosa.  5. Supple Neck, No JVD, No cervical lymphadenopathy  appriciated, No Carotid Bruits.  6. Symmetrical Chest wall movement, Good air movement bilaterally, CTAB.  7. RRR, systolic murmur noted.  8. Positive Bowel Sounds, Abdomen Soft, Non tender,.  9.  No Cyanosis, Normal Skin Turgor, No Skin Rash or Bruise.  10. Good muscle tone,  joints appear normal , no effusions, Normal ROM.    Data Review  CBC Recent Labs  Lab 08/11/18 1534 08/11/18 1613  WBC 11.1*  --   HGB 13.2 13.3  HCT 39.5 39.0  PLT 142*  --   MCV 94.5  --   MCH 31.6  --   MCHC 33.4  --   RDW 13.2  --   LYMPHSABS 1.6  --   MONOABS 1.5*  --   EOSABS 0.0  --   BASOSABS 0.0  --    ------------------------------------------------------------------------------------------------------------------  Chemistries  Recent Labs  Lab 08/11/18 1534 08/11/18 1613  NA 133* 137  K 3.6 3.7  CL 101 102  CO2 28  --   GLUCOSE 132* 125*  BUN 16 16  CREATININE 0.83 0.90  CALCIUM 9.3  --    ------------------------------------------------------------------------------------------------------------------ estimated creatinine clearance is 30.4 mL/min (by C-G formula based on SCr of 0.9 mg/dL). ------------------------------------------------------------------------------------------------------------------ No results for input(s): TSH, T4TOTAL, T3FREE, THYROIDAB in the last 72 hours.  Invalid input(s): FREET3   Coagulation profile No results for input(s): INR, PROTIME in the last 168 hours. ------------------------------------------------------------------------------------------------------------------- No results for input(s): DDIMER in the last 72 hours. -------------------------------------------------------------------------------------------------------------------  Cardiac Enzymes No results for input(s): CKMB, TROPONINI, MYOGLOBIN in the last 168 hours.  Invalid input(s):  CK ------------------------------------------------------------------------------------------------------------------ Invalid input(s): POCBNP   ---------------------------------------------------------------------------------------------------------------  Urinalysis    Component Value Date/Time   COLORURINE YELLOW 08/11/2018 1534   APPEARANCEUR CLOUDY (A) 08/11/2018 1534   LABSPEC 1.010 08/11/2018 1534   PHURINE 8.0 08/11/2018 1534   GLUCOSEU NEGATIVE 08/11/2018 1534   HGBUR MODERATE (A) 08/11/2018 1534   BILIRUBINUR NEGATIVE 08/11/2018 1534   KETONESUR NEGATIVE 08/11/2018 1534   PROTEINUR NEGATIVE 08/11/2018 1534   UROBILINOGEN 0.2 02/21/2015 1558   NITRITE POSITIVE (A) 08/11/2018 1534   LEUKOCYTESUR LARGE (A) 08/11/2018 1534    ----------------------------------------------------------------------------------------------------------------   Imaging results:   Mr Pelvis Wo Contrast  Result Date: 08/11/2018 CLINICAL DATA:  Fall yesterday, left hip pain. EXAM: MRI PELVIS WITHOUT CONTRAST TECHNIQUE: Multiplanar multisequence MR imaging of the pelvis was performed. No intravenous contrast was administered. COMPARISON:  Radiographs from 08/11/2018 FINDINGS: Urinary Tract: There is a fluid level in the urinary bladder, suspicious for hematocrit level/blood in the urinary bladder. Bowel:  Mild sigmoid colon diverticulosis. Vascular/Lymphatic: Unremarkable  Reproductive:  The uterus is absent.  Adnexa unremarkable. Other: Prominently dilated distal common bile duct as shown on MRI from 06/06/2016. There could be a small stone in the distal CBD on image 3/7 measuring about 2 mm in diameter right kidney lower pole cyst. Musculoskeletal: Osseous: Acute nondisplaced fracture of the left sacral ala extending vertically. Subtle edema in both SI joints. Acute fractures of the left pubic body extending into the adjacent medial portion of the rami. No definite right hemi pelvic fractures are  identified. The pubic fracture is associated with a 8.3 by 3.6 by 2.3 cm (volume = 36 cm^3) hematoma extending from the suprapubic region into the space of Retzius. Edema tracks along periureteral fascia planes. There is notable muscular edema within along the hip adductor musculature on the left. The left adductor aponeurosis attaches to the region of fracture in the left pubis, as does the left rectus abdominus aponeurosis. Edema tracks within along the left obturator internus muscle and left pelvic sidewall. IMPRESSION: 1. Acute fracture of the left pubis extending into the adjacent medial rami, and acute fracture of the left sacral ala. The pubic fracture is associated with a 36 cubic cm hematoma extending in the space of Retzius and suprapubic region, as well as notable edema tracking within the hip adductor musculature, along the obturator internus, and along the left pelvic sidewall and periureteral fascia planes. 2. There is a fluid-level in the urinary bladder suggesting blood in the urinary bladder which in turn raises the possibility of a bladder injury. I do not see definite bladder wall irregularity to suggest a bladder rupture, but a low threshold for further bladder workup is suggested given the apparent hematocrit level. 3. Mild sigmoid colon diverticulosis. 4. Chronic dilated distal common bile duct. Questionable 2 mm stone in the distal CBD, not causing current obstruction. Electronically Signed   By: Van Clines M.D.   On: 08/11/2018 15:02   Dg Hip Unilat W Or Wo Pelvis 2-3 Views Left  Result Date: 08/11/2018 CLINICAL DATA:  84 year old female with left hip pain after falling yesterday EXAM: DG HIP (WITH OR WITHOUT PELVIS) 2-3V LEFT COMPARISON:  None. FINDINGS: Subtle lucency running obliquely across the parasymphyseal left superior pubic ramus. Suspect a nondisplaced fracture involving the left superior pubic ramus in the parasymphyseal region. There is a moderate bilateral hip joint  degenerative osteoarthritis as evidenced by narrowing of the joint space and prominent osteophyte formation. No definite acute fracture of the left femoral neck although evaluation is somewhat limited by the degree of osteophyte formation. The remainder of the bony pelvis appears intact. Atherosclerotic calcifications visualized in the iliac and femoral vessels bilaterally. Unremarkable bowel gas pattern. IMPRESSION: 1. Suspect nondisplaced left parasymphyseal pubic ramus fracture. Recommend correlation for left inguinal/pubic tenderness to palpation. 2. No definite acute fracture of the left femur although evaluation is somewhat limited by the degree of osteophytes at the femoral head neck junction. If clinical concern persists for left proximal femoral fracture, consider further evaluation with MRI of the pelvis. 3. Moderate bilateral hip joint osteoarthritis with prominent osteophyte formation. 4.  Aortic Atherosclerosis (ICD10-170.0). Electronically Signed   By: Jacqulynn Cadet M.D.   On: 08/11/2018 09:41     Assessment & Plan  Left pubic ramus fracture Admit the patient to MedSurg for observation and pain control PT evaluation in a.m.  Possible bladder injury Urinalysis negative for hematuria Dr. Noland Fordyce consult from urology is pending  UTI Start IV Rocephin  Hypertension Uncontrolled probably due to pain,  continue with Lopressor  Hypothyroidism Continue with Synthroid  DVT Prophylaxis SCDs  AM Labs Ordered, also please review Full Orders    Code Status full  Disposition Plan: Home with home health  Time spent in minutes : 36 minutes  Condition GUARDED   @SIGNATURE @

## 2018-08-11 NOTE — ED Notes (Signed)
Patient transported to MRI 

## 2018-08-11 NOTE — ED Notes (Signed)
Transported to xray 

## 2018-08-12 DIAGNOSIS — W19XXXA Unspecified fall, initial encounter: Secondary | ICD-10-CM

## 2018-08-12 DIAGNOSIS — S32592A Other specified fracture of left pubis, initial encounter for closed fracture: Secondary | ICD-10-CM | POA: Diagnosis not present

## 2018-08-12 DIAGNOSIS — S3210XA Unspecified fracture of sacrum, initial encounter for closed fracture: Secondary | ICD-10-CM | POA: Diagnosis not present

## 2018-08-12 NOTE — Plan of Care (Signed)

## 2018-08-12 NOTE — Progress Notes (Signed)
PROGRESS NOTE    Mackenzie Madden  PJA:[id number] DOB: [birth year] DOA: 08/11/2018 PCP: Lavone Orn, MD    Brief Narrative:  84 y.o. female, with past medical history significant for hypertension and hypothyroidism presents status post fall at home yesterday in the bathroom during the night.  No preceding chest pains or palpitations, she just slipped.  Today the patient's started having lower back pain especially on the left side and she cannot bear weight on the left leg.  Work-up in the emergency room showed pelvic fracture with left pubic ramus fracture and a possible bladder injury.  Dr. Alyson Ingles from urology was consulted and is pending.  Assessment & Plan:   Principal Problem:   Sacral fracture, closed ([place]) Active Problems:   HTN (hypertension)   Chronic diastolic heart failure (HCC)  Left pubic ramus fracture PT consulted, pending eval Pain controlled at this time Seen by Ortho in ED, recommendations for PT  Possible bladder injury Urinalysis negative for hematuria Dr. Noland Fordyce consult from urology is pending Continued with foley cath per Urology recs, voiding well  UTI UA reviewed, findings suggestive of UTI Continued on rocephin  Hypertension Uncontrolled probably due to pain, continue with Lopressor  Hypothyroidism Continue with Synthroid  DVT Prophylaxis SCDs  DVT prophylaxis: SCD's Code Status: Full Family Communication: Pt in room,family at bedside Disposition Plan: Uncertain at this time  Consultants:   Orthopedic Surgery  Urology  Procedures:     Antimicrobials: Anti-infectives (From admission, onward)   Start     Dose/Rate Route Frequency Ordered Stop   08/11/18 2000  cefTRIAXone (ROCEPHIN) 1 g in sodium chloride 0.9 % 100 mL IVPB     1 g 200 mL/hr over 30 Minutes Intravenous Every 24 hours 08/11/18 1908     08/11/18 1830  cefTRIAXone (ROCEPHIN) injection 1 g  Status:  Discontinued     1 g Intramuscular Every 24 hours  08/11/18 1740 08/11/18 1908      Subjective: No complaints at this time  Objective: Vitals:   08/11/18 1738 08/11/18 2009 08/12/18 0458 08/12/18 0900  BP: (!) 167/90 (!) 144/67 (!) 144/62 (!) 159/74  Pulse: (!) 103 98 76   Resp: 16     Temp: 98 F (36.7 C) 98.6 F (37 C) 98.3 F (36.8 C)   TempSrc: Axillary Oral Oral   SpO2: 96% 95% 95%   Weight:      Height:        Intake/Output Summary (Last 24 hours) at 08/12/2018 1452 Last data filed at 08/12/2018 0442 Gross per 24 hour  Intake 106.57 ml  Output -  Net 106.57 ml   Filed Weights   08/11/18 0824  Weight: 44.5 kg    Examination:  General exam: Appears calm and comfortable  Respiratory system: Clear to auscultation. Respiratory effort normal. Cardiovascular system: S1 & S2 heard, RRR Gastrointestinal system: Abdomen is nondistended, soft and nontender. No organomegaly or masses felt. Normal bowel sounds heard. Central nervous system: Alert and oriented. No focal neurological deficits. Extremities: Symmetric 5 x 5 power. Skin: No rashes, lesions Psychiatry: Judgement and insight appear normal. Mood & affect appropriate.   Data Reviewed: I have personally reviewed following labs and imaging studies  CBC: Recent Labs  Lab 08/11/18 1534 08/11/18 1613  WBC 11.1*  --   NEUTROABS 8.0*  --   HGB 13.2 13.3  HCT 39.5 39.0  MCV 94.5  --   PLT 142*  --    Basic Metabolic Panel: Recent Labs  Lab 08/11/18 1534  08/11/18 1613  NA 133* 137  K 3.6 3.7  CL 101 102  CO2 28  --   GLUCOSE 132* 125*  BUN 16 16  CREATININE 0.83 0.90  CALCIUM 9.3  --    GFR: Estimated Creatinine Clearance: 30.4 mL/min (by C-G formula based on SCr of 0.9 mg/dL). Liver Function Tests: No results for input(s): AST, ALT, ALKPHOS, BILITOT, PROT, ALBUMIN in the last 168 hours. No results for input(s): LIPASE, AMYLASE in the last 168 hours. No results for input(s): AMMONIA in the last 168 hours. Coagulation Profile: No results for  input(s): INR, PROTIME in the last 168 hours. Cardiac Enzymes: No results for input(s): CKTOTAL, CKMB, CKMBINDEX, TROPONINI in the last 168 hours. BNP (last 3 results) No results for input(s): PROBNP in the last 8760 hours. HbA1C: No results for input(s): HGBA1C in the last 72 hours. CBG: No results for input(s): GLUCAP in the last 168 hours. Lipid Profile: No results for input(s): CHOL, HDL, LDLCALC, TRIG, CHOLHDL, LDLDIRECT in the last 72 hours. Thyroid Function Tests: No results for input(s): TSH, T4TOTAL, FREET4, T3FREE, THYROIDAB in the last 72 hours. Anemia Panel: No results for input(s): VITAMINB12, FOLATE, FERRITIN, TIBC, IRON, RETICCTPCT in the last 72 hours. Sepsis Labs: No results for input(s): PROCALCITON, LATICACIDVEN in the last 168 hours.  No results found for this or any previous visit (from the past 240 hour(s)).   Radiology Studies: Mr Pelvis Wo Contrast  Result Date: 08/11/2018 CLINICAL DATA:  Fall yesterday, left hip pain. EXAM: MRI PELVIS WITHOUT CONTRAST TECHNIQUE: Multiplanar multisequence MR imaging of the pelvis was performed. No intravenous contrast was administered. COMPARISON:  Radiographs from 08/11/2018 FINDINGS: Urinary Tract: There is a fluid level in the urinary bladder, suspicious for hematocrit level/blood in the urinary bladder. Bowel:  Mild sigmoid colon diverticulosis. Vascular/Lymphatic: Unremarkable Reproductive:  The uterus is absent.  Adnexa unremarkable. Other: Prominently dilated distal common bile duct as shown on MRI from 06/06/2016. There could be a small stone in the distal CBD on image 3/7 measuring about 2 mm in diameter right kidney lower pole cyst. Musculoskeletal: Osseous: Acute nondisplaced fracture of the left sacral ala extending vertically. Subtle edema in both SI joints. Acute fractures of the left pubic body extending into the adjacent medial portion of the rami. No definite right hemi pelvic fractures are identified. The pubic  fracture is associated with a 8.3 by 3.6 by 2.3 cm (volume = 36 cm^3) hematoma extending from the suprapubic region into the space of Retzius. Edema tracks along periureteral fascia planes. There is notable muscular edema within along the hip adductor musculature on the left. The left adductor aponeurosis attaches to the region of fracture in the left pubis, as does the left rectus abdominus aponeurosis. Edema tracks within along the left obturator internus muscle and left pelvic sidewall. IMPRESSION: 1. Acute fracture of the left pubis extending into the adjacent medial rami, and acute fracture of the left sacral ala. The pubic fracture is associated with a 36 cubic cm hematoma extending in the space of Retzius and suprapubic region, as well as notable edema tracking within the hip adductor musculature, along the obturator internus, and along the left pelvic sidewall and periureteral fascia planes. 2. There is a fluid-level in the urinary bladder suggesting blood in the urinary bladder which in turn raises the possibility of a bladder injury. I do not see definite bladder wall irregularity to suggest a bladder rupture, but a low threshold for further bladder workup is suggested given the apparent  hematocrit level. 3. Mild sigmoid colon diverticulosis. 4. Chronic dilated distal common bile duct. Questionable 2 mm stone in the distal CBD, not causing current obstruction. Electronically Signed   By: Van Clines M.D.   On: 08/11/2018 15:02   Dg Hip Unilat W Or Wo Pelvis 2-3 Views Left  Result Date: 08/11/2018 CLINICAL DATA:  84 year old female with left hip pain after falling yesterday EXAM: DG HIP (WITH OR WITHOUT PELVIS) 2-3V LEFT COMPARISON:  None. FINDINGS: Subtle lucency running obliquely across the parasymphyseal left superior pubic ramus. Suspect a nondisplaced fracture involving the left superior pubic ramus in the parasymphyseal region. There is a moderate bilateral hip joint degenerative  osteoarthritis as evidenced by narrowing of the joint space and prominent osteophyte formation. No definite acute fracture of the left femoral neck although evaluation is somewhat limited by the degree of osteophyte formation. The remainder of the bony pelvis appears intact. Atherosclerotic calcifications visualized in the iliac and femoral vessels bilaterally. Unremarkable bowel gas pattern. IMPRESSION: 1. Suspect nondisplaced left parasymphyseal pubic ramus fracture. Recommend correlation for left inguinal/pubic tenderness to palpation. 2. No definite acute fracture of the left femur although evaluation is somewhat limited by the degree of osteophytes at the femoral head neck junction. If clinical concern persists for left proximal femoral fracture, consider further evaluation with MRI of the pelvis. 3. Moderate bilateral hip joint osteoarthritis with prominent osteophyte formation. 4.  Aortic Atherosclerosis (ICD10-170.0). Electronically Signed   By: Jacqulynn Cadet M.D.   On: 08/11/2018 09:41    Scheduled Meds: . brimonidine  1 drop Left Eye TID   Or  . timolol  1 drop Left Eye TID  . latanoprost  1 drop Both Eyes QHS  . levothyroxine  100 mcg Oral QAC breakfast  . metoprolol tartrate  25 mg Oral BID  . multivitamin with minerals  1 tablet Oral Daily  . senna-docusate  1 tablet Oral QHS  . sodium chloride flush  3 mL Intravenous Q12H   Continuous Infusions: . sodium chloride 10 mL/hr at 08/11/18 1800  . cefTRIAXone (ROCEPHIN)  IV Stopped (08/12/18 1336)     LOS: 1 day   Marylu Lund, MD [facility] Pager On Amion  If 7PM-7AM, please contact night-coverage 08/12/2018, 2:52 PM

## 2018-08-12 NOTE — Progress Notes (Signed)
Patient ID: DHARA SCHEPP, female   DOB: Feb 10, 1928, 84 y.o.   MRN: [id number] I actually saw the patient in the ED yesterday and came by to see her today.  She is the grandmother of my Surveyor, minerals.  I did review her films (plain films and MRI) that do show her pelvic fractures.  These are stable fractures.  I also examined her hip and spoke with family at the bedside.  She can be up with therapy for mobility - have ordered PT/OT.

## 2018-08-13 DIAGNOSIS — I10 Essential (primary) hypertension: Secondary | ICD-10-CM | POA: Diagnosis not present

## 2018-08-13 DIAGNOSIS — S3210XA Unspecified fracture of sacrum, initial encounter for closed fracture: Secondary | ICD-10-CM | POA: Diagnosis not present

## 2018-08-13 DIAGNOSIS — S3210XS Unspecified fracture of sacrum, sequela: Secondary | ICD-10-CM

## 2018-08-13 DIAGNOSIS — W19XXXA Unspecified fall, initial encounter: Secondary | ICD-10-CM | POA: Diagnosis not present

## 2018-08-13 DIAGNOSIS — S32592A Other specified fracture of left pubis, initial encounter for closed fracture: Secondary | ICD-10-CM | POA: Diagnosis not present

## 2018-08-13 MED ORDER — HYDRALAZINE HCL 20 MG/ML IJ SOLN
5.0000 mg | INTRAMUSCULAR | Status: DC | PRN
  Administered 2018-08-13: 5 mg via INTRAVENOUS
  Filled 2018-08-13: qty 1

## 2018-08-13 MED ORDER — LISINOPRIL 5 MG PO TABS
5.0000 mg | ORAL_TABLET | Freq: Every day | ORAL | Status: DC
  Administered 2018-08-13 – 2018-08-16 (×4): 5 mg via ORAL
  Filled 2018-08-13 (×4): qty 1

## 2018-08-13 NOTE — Care Management Note (Signed)
Case Management Note  Patient Details  Name: Mackenzie Madden MRN: [id number] Date of Birth: 03-12-1928  Subjective/Objective:  Fall at home, Left pubis fracture,  Lives at home with husband                Action/Plan: NCM spoke to pt, husband and dtr, Learta Codding at bedside. Pt states she prefers to dc to IP rehab. She has RW at home. States no HH in the past. Will continue to follow for dc needs.   Expected Discharge Date:                  Expected Discharge Plan:  [place]  In-House Referral:  Clinical Social Work  Discharge planning Services     Post Acute Care Choice:  NA Choice offered to:  NA   Status of Service:  In process, will continue to follow  If discussed at Long Length of Stay Meetings, dates discussed:    Additional Comments:  Erenest Rasher, RN 08/13/2018, 3:31 PM

## 2018-08-13 NOTE — Progress Notes (Signed)
Rehab Admissions Coordinator Note:  Patient was screened by Cleatrice Burke for appropriateness for an Inpatient Acute Rehab Consult per OT recommendation.   At this time, we are recommending Inpatient Rehab consult. I will contact Dr. Wyline Copas for order per niece request.  Danne Baxter, RN, MSN Rehab Admissions Coordinator [phone] 08/13/2018 12:59 PM

## 2018-08-13 NOTE — Care Management Obs Status (Signed)
[place] NOTIFICATION   Patient Details  Name: Mackenzie Madden MRN: [id number] Date of Birth: 11-Mar-1928   Medicare Observation Status Notification Given:  Yes    Erenest Rasher, RN 08/13/2018, 3:28 PM

## 2018-08-13 NOTE — Evaluation (Signed)
Occupational Therapy Evaluation Patient Details Name: Mackenzie Madden MRN: [id number] DOB: 05/02/1928 Today's Date: 08/13/2018    History of Present Illness Mackenzie Madden  is a 84 y.o. female, with past medical history significant for hypertension and hypothyroidism presents status post fall at home in the bathroom; Sustained Acute fracture of the left pubis extending into the adjacent medial rami, and acute fracture of the left sacral ala, stable fxs, WBAT per Ortho; possible bladder injury, Urology consult pending   Clinical Impression   PTA Pt completely independent in ADL/IADL, enjoys working in their big garden. Pt is currently max A +2 for boost up from surfaces for transfers and mod A +2 for short mobility/SPT. Pt is max A +2 for LB ADL and set up for grooming/upper body ADL. Pt will benefit from skilled OT in the acute setting as well as afterwards at the CIR level. Pt has excellent family support that can provide 24 hour assist, is highly motivated and CIR is essential to her recovery to PLOF. Next session to focus on continued transfer training as well as intro to AE for LB ADL.    Follow Up Recommendations  CIR;Supervision/Assistance - 24 hour    Equipment Recommendations  3 in 1 bedside commode;Tub/shower seat;Other (comment)(defer to next venue)    Recommendations for Other Services Rehab consult     Precautions / Restrictions Precautions Precautions: Fall Restrictions Weight Bearing Restrictions: Yes RLE Weight Bearing: Weight bearing as tolerated LLE Weight Bearing: Weight bearing as tolerated      Mobility Bed Mobility Overal bed mobility: Needs Assistance Bed Mobility: Supine to Sit     Supine to sit: Mod assist;+2 for physical assistance     General bed mobility comments: vc for step by step sequencing - going towards Right side for pain management, mod A to bring hips EOB utilizing slight bridging, heavy mod for trunk elevation, and use of the bed  pad to bring hips EOB  Transfers Overall transfer level: Needs assistance Equipment used: Rolling walker (2 wheeled) Transfers: Sit to/from [facility] Sit to Stand: Max assist;+2 physical assistance;+2 safety/equipment Stand pivot transfers: Mod assist;+2 physical assistance;+2 safety/equipment       General transfer comment: max A +2 for initial boost from EOB, with vc able to sequence pivot to chair with a few steps forward first    Balance Overall balance assessment: Needs assistance Sitting-balance support: Bilateral upper extremity supported;Feet supported Sitting balance-Leahy Scale: Fair     Standing balance support: Bilateral upper extremity supported Standing balance-Leahy Scale: Poor Standing balance comment: dependent on BUE on RW and external assist                           ADL either performed or assessed with clinical judgement   ADL Overall ADL's : Needs assistance/impaired Eating/Feeding: Modified independent;Sitting   Grooming: Set up;Sitting Grooming Details (indicate cue type and reason): Pt currently unable to tolerate extended standing and is dependent on BUE in standing for balance Upper Body Bathing: Set up;Sitting   Lower Body Bathing: Maximal assistance   Upper Body Dressing : Set up;Sitting   Lower Body Dressing: Maximal assistance;+2 for physical assistance;+2 for safety/equipment;Sit to/from stand   Toilet Transfer: Moderate assistance;Maximal assistance;+2 for physical assistance;Stand-pivot(initial stand max A, pivot mod A) Toilet Transfer Details (indicate cue type and reason): simulated through recliner transfer [place] and Hygiene: Maximal assistance;+2 for physical assistance;+2 for safety/equipment;Sit to/from stand Toileting - Water quality scientist Details (  indicate cue type and reason): spreading her legs causes spasms and is very painful     Functional mobility during ADLs:  Moderate assistance;+2 for physical assistance;+2 for safety/equipment;Rolling walker;Cueing for sequencing General ADL Comments: pain limits access to LB for ADL and for transfers     Vision Baseline Vision/History: Wears glasses Wears Glasses: Reading only Patient Visual Report: No change from baseline       Perception     Praxis      Pertinent Vitals/Pain Pain Assessment: 0-10 Pain Score: 9  Pain Location: Bil hip during movement Pain Descriptors / Indicators: Grimacing;Sharp;Sore Pain Intervention(s): Monitored during session;Repositioned     Hand Dominance Right   Extremity/Trunk Assessment Upper Extremity Assessment Upper Extremity Assessment: Overall WFL for tasks assessed   Lower Extremity Assessment Lower Extremity Assessment: RLE deficits/detail;LLE deficits/detail;Defer to PT evaluation RLE Deficits / Details: less painful than left RLE Coordination: decreased gross motor LLE Deficits / Details: more painful than right LLE Coordination: decreased gross motor;decreased fine motor   Cervical / Trunk Assessment Cervical / Trunk Assessment: Other exceptions Cervical / Trunk Exceptions: forward head, rounded shoulders   Communication Communication Communication: No difficulties   Cognition Arousal/Alertness: Awake/alert Behavior During Therapy: WFL for tasks assessed/performed Overall Cognitive Status: Within Functional Limits for tasks assessed                                     General Comments  husband present throughout.     Exercises     Shoulder Instructions      Home Living Family/patient expects to be discharged to:: Private residence Living Arrangements: Spouse/significant other Available Help at Discharge: Family;Available 24 hours/day Type of Home: House Home Access: Stairs to enter [employer] of Steps: 5 Entrance Stairs-Rails: Left Home Layout: One level     Bathroom Shower/Tub: Tub/shower  unit;Door;Walk-in Psychologist, prison and probation services: Handicapped height Bathroom Accessibility: Yes How Accessible: Accessible via walker Home Equipment: [place] - 2 wheels          Prior Functioning/Environment Level of Independence: Independent                 OT Problem List: Decreased strength;Decreased range of motion;Decreased activity tolerance;Impaired balance (sitting and/or standing);Decreased knowledge of use of DME or AE;Pain      OT Treatment/Interventions: Self-care/ADL training;Therapeutic exercise;DME and/or AE instruction;Therapeutic activities;Patient/family education;Balance training    OT Goals(Current goals can be found in the care plan section) Acute Rehab OT Goals Patient Stated Goal: get back to independent to plant my spring garden OT Goal Formulation: With patient/family Time For Goal Achievement: 08/27/18 Potential to Achieve Goals: Good ADL Goals Pt Will Perform Grooming: with modified independence Pt Will Perform Lower Body Bathing: with set-up;with adaptive equipment;sitting/lateral leans Pt Will Perform Lower Body Dressing: with min guard assist;with caregiver independent in assisting;sit to/from stand Pt Will Transfer to Toilet: with min assist;stand pivot transfer;bedside commode Pt Will Perform Toileting - Clothing Manipulation and hygiene: sitting/lateral leans;with set-up Pt Will Perform Tub/Shower Transfer: Shower transfer;with min assist;ambulating;shower seat Additional ADL Goal #1: Pt will perform bed mobility at supervision level prior to engaging in ADL activity  OT Frequency: Min 2X/week   Barriers to D/C:            Co-evaluation PT/OT/SLP Co-Evaluation/Treatment: Yes Reason for Co-Treatment: Complexity of the patient's impairments (multi-system involvement);For patient/therapist safety;To address functional/ADL transfers PT goals addressed during session: Mobility/safety with mobility;Proper use of  DME;Balance;Strengthening/ROM OT  goals addressed during session: ADL's and self-care;Proper use of Adaptive equipment and DME;Strengthening/ROM      AM-PAC PT "6 Clicks" Daily Activity     Outcome Measure Help from another person eating meals?: None Help from another person taking care of personal grooming?: A Little Help from another person toileting, which includes using toliet, bedpan, or urinal?: A Lot Help from another person bathing (including washing, rinsing, drying)?: A Lot Help from another person to put on and taking off regular upper body clothing?: A Little Help from another person to put on and taking off regular lower body clothing?: A Lot 6 Click Score: 16   End of Session Equipment Utilized During Treatment: Gait belt;Rolling walker Nurse Communication: Mobility status;Weight bearing status  Activity Tolerance: Patient tolerated treatment well Patient left: in chair;with call bell/phone within reach;with chair alarm set;with family/visitor present  OT Visit Diagnosis: Unsteadiness on feet (R26.81);Other abnormalities of gait and mobility (R26.89);Pain Pain - Right/Left: Left(Bilateral) Pain - part of body: Hip                Time: 1123-1200 OT Time Calculation (min): 37 min Charges:  OT General Charges $OT Visit: 1 Visit OT Evaluation $OT Eval Moderate Complexity: [place] OTR/L Acute Rehabilitation Services Pager: [phone] Office: [place] 08/13/2018, 12:52 PM

## 2018-08-13 NOTE — Consult Note (Signed)
Urology Consult Note   Requesting Attending Physician:  Donne Hazel, MD Service Providing Consult: Urology  Consulting Attending: Nicolette Bang, MD   Reason for Consult: Concern for bladder injury on MRI  HPI: Mackenzie Madden is seen in consultation for reasons noted above at the request of Donne Hazel, MD for evaluation of finding of bladder fluid level on pelvic MRI which radiology read as concern for possible bladder injury.   The patient was admitted on 08/11/18 after falling at home on 08/10/18. She was found to have a pelvic fracture with associated pelvic hematoma on imaging. There was also concern from radiology for a fluid level in the urinary bladder without evidence of a bladder wall irregularity.   The patient denies a history of gross hematuria or difficulty voiding. Reports left hip and pelvic pain. Denies vaginal bleeding or pain. Prior to fall, she does report a history of dysuria and urgency.   Reports history of what sounds like a cystocele repair ("bladder tacked up").   UA was positive for markers of UTI; no urine culture sent.   Past Medical History: Past Medical History:  Diagnosis Date  . Hypertension   . Thyroid disease     Past Surgical History:  Past Surgical History:  Procedure Laterality Date  . SPLENECTOMY, TOTAL      Medication: Current Facility-Administered Medications  Medication Dose Route Frequency Provider Last Rate Last Dose  . 0.9 %  sodium chloride infusion  250 mL Intravenous PRN Merton Border, MD 10 mL/hr at 08/11/18 1800    . acetaminophen (TYLENOL) tablet 650 mg  650 mg Oral Q6H PRN Merton Border, MD       Or  . acetaminophen (TYLENOL) suppository 650 mg  650 mg Rectal Q6H PRN Merton Border, MD      . brimonidine (ALPHAGAN) 0.2 % ophthalmic solution 1 drop  1 drop Left Eye TID Einar Grad, RPH   1 drop at 08/12/18 1712   Or  . timolol (TIMOPTIC) 0.5 % ophthalmic solution 1 drop  1 drop Left Eye TID Einar Grad,  RPH   1 drop at 08/13/18 4098  . cefTRIAXone (ROCEPHIN) 1 g in sodium chloride 0.9 % 100 mL IVPB  1 g Intravenous Q24H Einar Grad, RPH 200 mL/hr at 08/12/18 2105 1 g at 08/12/18 2105  . HYDROmorphone (DILAUDID) injection 0.5 mg  0.5 mg Intravenous Q4H PRN Merton Border, MD      . latanoprost (XALATAN) 0.005 % ophthalmic solution 1 drop  1 drop Both Eyes QHS Merton Border, MD   1 drop at 08/12/18 2058  . levothyroxine (SYNTHROID, LEVOTHROID) tablet 100 mcg  100 mcg Oral QAC breakfast Merton Border, MD   100 mcg at 08/13/18 0600  . metoprolol tartrate (LOPRESSOR) tablet 25 mg  25 mg Oral BID Merton Border, MD   25 mg at 08/13/18 0825  . multivitamin with minerals tablet 1 tablet  1 tablet Oral Daily Merton Border, MD   1 tablet at 08/13/18 0825  . ondansetron (ZOFRAN) injection 4 mg  4 mg Intravenous Q6H PRN Merton Border, MD      . oxyCODONE (Oxy IR/ROXICODONE) immediate release tablet 5 mg  5 mg Oral Q4H PRN Merton Border, MD   5 mg at 08/13/18 0828  . senna-docusate (Senokot-S) tablet 1 tablet  1 tablet Oral QHS Merton Border, MD   1 tablet at 08/12/18 2055  . sodium chloride flush (NS) 0.9 % injection 3 mL  3 mL Intravenous  Q12H Merton Border, MD   3 mL at 08/13/18 0826  . sodium chloride flush (NS) 0.9 % injection 3 mL  3 mL Intravenous PRN Merton Border, MD        Allergies: No Known Allergies  Social History: Social History   Tobacco Use  . Smoking status: Never Smoker  . Smokeless tobacco: Never Used  Substance Use Topics  . Alcohol use: No  . Drug use: No    Family History Family History  Problem Relation Age of Onset  . CAD Mother     Review of Systems 10 systems were reviewed and are negative except as noted specifically in the HPI.  Objective   Vital signs in last 24 hours: BP (!) 153/82 (BP Location: Right Arm)   Pulse 72   Temp 98.4 F (36.9 C) (Oral)   Resp 18   Ht 5' (1.524 m)   Wt 44.5 kg   SpO2 95%   BMI 19.14 kg/m   Physical Exam General: NAD, A&O, resting,  appropriate HEENT: [place]/AT, EOMI, MMM Pulmonary: Normal work of breathing Cardiovascular: HDS, adequate peripheral perfusion Abdomen: Soft, NTTP, nondistended. GU: urine draining clear yellow urine Extremities: warm and well perfused Neuro: Appropriate, no focal neurological deficits  Most Recent Labs: Lab Results  Component Value Date   WBC 11.1 (H) 08/11/2018   HGB 13.3 08/11/2018   HCT 39.0 08/11/2018   PLT 142 (L) 08/11/2018    Lab Results  Component Value Date   NA 137 08/11/2018   K 3.7 08/11/2018   CL 102 08/11/2018   CO2 28 08/11/2018   BUN 16 08/11/2018   CREATININE 0.90 08/11/2018   CALCIUM 9.3 08/11/2018   MG 2.2 07/10/2009    Lab Results  Component Value Date   INR 2.45 (H) 07/14/2009   APTT 24 06/22/2009     IMAGING: Mr Pelvis Wo Contrast  Result Date: 08/11/2018 CLINICAL DATA:  Fall yesterday, left hip pain. EXAM: MRI PELVIS WITHOUT CONTRAST TECHNIQUE: Multiplanar multisequence MR imaging of the pelvis was performed. No intravenous contrast was administered. COMPARISON:  Radiographs from 08/11/2018 FINDINGS: Urinary Tract: There is a fluid level in the urinary bladder, suspicious for hematocrit level/blood in the urinary bladder. Bowel:  Mild sigmoid colon diverticulosis. Vascular/Lymphatic: Unremarkable Reproductive:  The uterus is absent.  Adnexa unremarkable. Other: Prominently dilated distal common bile duct as shown on MRI from 06/06/2016. There could be a small stone in the distal CBD on image 3/7 measuring about 2 mm in diameter right kidney lower pole cyst. Musculoskeletal: Osseous: Acute nondisplaced fracture of the left sacral ala extending vertically. Subtle edema in both SI joints. Acute fractures of the left pubic body extending into the adjacent medial portion of the rami. No definite right hemi pelvic fractures are identified. The pubic fracture is associated with a 8.3 by 3.6 by 2.3 cm (volume = 36 cm^3) hematoma extending from the suprapubic  region into the space of Retzius. Edema tracks along periureteral fascia planes. There is notable muscular edema within along the hip adductor musculature on the left. The left adductor aponeurosis attaches to the region of fracture in the left pubis, as does the left rectus abdominus aponeurosis. Edema tracks within along the left obturator internus muscle and left pelvic sidewall. IMPRESSION: 1. Acute fracture of the left pubis extending into the adjacent medial rami, and acute fracture of the left sacral ala. The pubic fracture is associated with a 36 cubic cm hematoma extending in the space of Retzius and suprapubic region,  as well as notable edema tracking within the hip adductor musculature, along the obturator internus, and along the left pelvic sidewall and periureteral fascia planes. 2. There is a fluid-level in the urinary bladder suggesting blood in the urinary bladder which in turn raises the possibility of a bladder injury. I do not see definite bladder wall irregularity to suggest a bladder rupture, but a low threshold for further bladder workup is suggested given the apparent hematocrit level. 3. Mild sigmoid colon diverticulosis. 4. Chronic dilated distal common bile duct. Questionable 2 mm stone in the distal CBD, not causing current obstruction. Electronically Signed   By: Van Clines M.D.   On: 08/11/2018 15:02    ------  Assessment:  84 y.o. female with a PMH significant for hypertension and hypothyroidism who is admitted for a pubic rami fracture. Urology is consulted due to a fluid level visible on MRI which radiology read as possible bladder injury. No gross hematuria. UA is concerning for urinary tract infection; no urine culture sent. UA showed 0-5 RBCs. Foley draining clear yellow urine.   Given these findings, bladder rupture is extremely unlikely and patient requires no further urologic intervention.   Recommendations: - Foley catheter per primary team. At time of  removal, please check post-void residual to confirm that patient is adequately emptying her bladder in setting of pelvic hematoma.   Thank you for this consult. Please contact the urology consult pager with any further questions/concerns.

## 2018-08-13 NOTE — Evaluation (Signed)
Physical Therapy Evaluation Patient Details Name: Mackenzie Madden MRN: [id number] DOB: 19-May-1928 Today's Date: 08/13/2018   History of Present Illness  Mackenzie Madden  is a 84 y.o. female, with past medical history significant for hypertension and hypothyroidism presents status post fall at home in the bathroom; Sustained Acute fracture of the left pubis extending into the adjacent medial rami, and acute fracture of the left sacral ala, stable fxs, WBAT per Ortho; possible bladder injury, Urology consult pending  Clinical Impression   Pt admitted with above diagnosis. Pt currently with functional limitations due to the deficits listed below (see PT Problem List). Independent, walking without assistive device prior to admission; presents with pain, decr functional mobility; Participating well, and with good family support; REcommend CIR for post-acute rehab to maximize independence and safety with mobility;  Pt will benefit from skilled PT to increase their independence and safety with mobility to allow discharge to the venue listed below.       Follow Up Recommendations CIR    Equipment Recommendations  Rolling walker with 5" wheels;3in1 (PT)(may need youth-sized RW)    Recommendations for Other Services Rehab consult     Precautions / Restrictions Precautions Precautions: Fall Restrictions Weight Bearing Restrictions: Yes RLE Weight Bearing: Weight bearing as tolerated LLE Weight Bearing: Weight bearing as tolerated      Mobility  Bed Mobility Overal bed mobility: Needs Assistance Bed Mobility: Supine to Sit     Supine to sit: Mod assist;+2 for physical assistance     General bed mobility comments: vc for step by step sequencing - going towards Right side for pain management, mod A to bring hips EOB utilizing slight bridging, heavy mod for trunk elevation, and use of the bed pad to bring hips EOB  Transfers Overall transfer level: Needs assistance Equipment  used: Rolling walker (2 wheeled) Transfers: Sit to/from [facility] Sit to Stand: Max assist;+2 physical assistance;+2 safety/equipment Stand pivot transfers: Mod assist;+2 physical assistance;+2 safety/equipment       General transfer comment: max A +2 for initial boost from EOB, with vc able to sequence pivot to chair with a few steps forward first  Ambulation/Gait                Stairs            Wheelchair Mobility    Modified Rankin (Stroke Patients Only)       Balance Overall balance assessment: Needs assistance Sitting-balance support: Bilateral upper extremity supported;Feet supported Sitting balance-Leahy Scale: Fair     Standing balance support: Bilateral upper extremity supported Standing balance-Leahy Scale: Poor Standing balance comment: dependent on BUE on RW and external assist                             Pertinent Vitals/Pain Pain Assessment: 0-10 Pain Score: 9  Pain Location: Bil hip during movement Pain Descriptors / Indicators: Grimacing;Sharp;Sore Pain Intervention(s): Monitored during session    Home Living Family/patient expects to be discharged to:: Private residence Living Arrangements: Spouse/significant other Available Help at Discharge: Family;Available 24 hours/day Type of Home: House Home Access: Stairs to enter Entrance Stairs-Rails: Left Entrance Stairs-Number of Steps: 5 Home Layout: One level Home Equipment: Walker - 2 wheels      Prior Function Level of Independence: Independent         Comments: Reports a few falls within the pas year     Hand Dominance   Dominant Hand: Right  Extremity/Trunk Assessment   Upper Extremity Assessment Upper Extremity Assessment: Defer to OT evaluation    Lower Extremity Assessment Lower Extremity Assessment: LLE deficits/detail;RLE deficits/detail RLE Deficits / Details: less painful than left RLE Coordination: decreased gross motor LLE  Deficits / Details: Grossly decr Hip ROM in all planes, limited by pain LLE Coordination: decreased gross motor;decreased fine motor    Cervical / Trunk Assessment Cervical / Trunk Assessment: Other exceptions Cervical / Trunk Exceptions: forward head, rounded shoulders  Communication   Communication: No difficulties  Cognition Arousal/Alertness: Awake/alert Behavior During Therapy: WFL for tasks assessed/performed Overall Cognitive Status: Within Functional Limits for tasks assessed                                        General Comments General comments (skin integrity, edema, etc.): husband present throughout.     Exercises     Assessment/Plan    PT Assessment Patient needs continued PT services  PT Problem List Decreased strength;Decreased range of motion;Decreased activity tolerance;Decreased balance;Decreased mobility;Decreased knowledge of use of DME;Decreased safety awareness;Decreased knowledge of precautions;Pain       PT Treatment Interventions DME instruction;Gait training;Functional mobility training;Therapeutic activities;Therapeutic exercise;Balance training;Patient/family education    PT Goals (Current goals can be found in the Care Plan section)  Acute Rehab PT Goals Patient Stated Goal: get back to independent to plant my spring garden PT Goal Formulation: With patient Time For Goal Achievement: 08/27/18 Potential to Achieve Goals: Good    Frequency Min 5X/week   Barriers to discharge        Co-evaluation PT/OT/SLP Co-Evaluation/Treatment: Yes Reason for Co-Treatment: Complexity of the patient's impairments (multi-system involvement);For patient/therapist safety PT goals addressed during session: Mobility/safety with mobility OT goals addressed during session: ADL's and self-care       AM-PAC PT "6 Clicks" Daily Activity  Outcome Measure Difficulty turning over in bed (including adjusting bedclothes, sheets and blankets)?:  Unable Difficulty moving from lying on back to sitting on the side of the bed? : Unable Difficulty sitting down on and standing up from a chair with arms (e.g., wheelchair, bedside commode, etc,.)?: Unable Help needed moving to and from a bed to chair (including a wheelchair)?: A Lot Help needed walking in hospital room?: A Lot Help needed climbing 3-5 steps with a railing? : Total 6 Click Score: 8    End of Session Equipment Utilized During Treatment: Gait belt Activity Tolerance: Patient tolerated treatment well Patient left: in chair;with call bell/phone within reach;with chair alarm set Nurse Communication: Mobility status PT Visit Diagnosis: Unsteadiness on feet (R26.81);Other abnormalities of gait and mobility (R26.89);Repeated falls (R29.6);History of falling (Z91.81);Pain Pain - Right/Left: Left Pain - part of body: (pelvis)    Time: 1121-1200 PT Time Calculation (min) (ACUTE ONLY): 39 min   Charges:   PT Evaluation $PT Eval Moderate Complexity: 1 Mod PT Treatments $Therapeutic Activity: 8-22 mins        Roney Marion, PT  Acute Rehabilitation Services Pager [phone] Office [phone]   Colletta [place] 08/13/2018, 1:16 PM

## 2018-08-13 NOTE — Progress Notes (Signed)
Occupational Therapy Treatment Patient Details Name: Mackenzie Madden MRN: [id number] DOB: 05-11-1928 Today's Date: 08/13/2018    History of present illness Mackenzie Madden  is a 84 y.o. female, with past medical history significant for hypertension and hypothyroidism presents status post fall at home in the bathroom; Sustained Acute fracture of the left pubis extending into the adjacent medial rami, and acute fracture of the left sacral ala, stable fxs, WBAT per Ortho; possible bladder injury, Urology consult pending   OT comments  Pt progressing towards OT goals. RN staff requested assist for return to bed, Pt was able to perform transfer at max/mod A +2, bed mobility was od A +2 as well. OT will continue to follow acutely to maximize safety and independence in ADL and functional transfers. Next session to focus on continued transfer training as well as intro to AE for LB ADL.   Follow Up Recommendations  CIR;Supervision/Assistance - 24 hour    Equipment Recommendations  3 in 1 bedside commode;Tub/shower seat;Other (comment)(defer to next venue)    Recommendations for Other Services Rehab consult    Precautions / Restrictions Precautions Precautions: Fall Restrictions Weight Bearing Restrictions: Yes RLE Weight Bearing: Weight bearing as tolerated LLE Weight Bearing: Weight bearing as tolerated       Mobility Bed Mobility Overal bed mobility: Needs Assistance Bed Mobility: Sit to Supine     Supine to sit: Mod assist;+2 for physical assistance Sit to supine: Max assist;+2 for physical assistance   General bed mobility comments: helicopter technique used for return to bed and to allow pt to keep legs together for pain management  Transfers Overall transfer level: Needs assistance Equipment used: Rolling walker (2 wheeled) Transfers: Sit to/from [facility] Sit to Stand: Max assist;+2 physical assistance;+2 safety/equipment Stand pivot transfers:  Mod assist;+2 physical assistance;+2 safety/equipment       General transfer comment: max A +2 for initial boost from recliner, mod A to pivot with minimal WB through LLE     Balance Overall balance assessment: Needs assistance Sitting-balance support: Bilateral upper extremity supported;Feet supported Sitting balance-Leahy Scale: Fair     Standing balance support: Bilateral upper extremity supported Standing balance-Leahy Scale: Poor Standing balance comment: dependent on BUE on RW and external assist                           ADL either performed or assessed with clinical judgement   ADL Overall ADL's : Needs assistance/impaired Eating/Feeding: Modified independent;Sitting   Grooming: Set up;Sitting Grooming Details (indicate cue type and reason): Pt currently unable to tolerate extended standing and is dependent on BUE in standing for balance Upper Body Bathing: Set up;Sitting   Lower Body Bathing: Maximal assistance   Upper Body Dressing : Set up;Sitting   Lower Body Dressing: Maximal assistance;+2 for physical assistance;+2 for safety/equipment;Sit to/from stand   Toilet Transfer: Maximal assistance;+2 for physical assistance;Stand-pivot;RW Toilet Transfer Details (indicate cue type and reason): simulated through bed to recliner tranfsre Toileting- Clothing Manipulation and Hygiene: Maximal assistance;+2 for physical assistance;+2 for safety/equipment;Sit to/from stand Toileting - Clothing Manipulation Details (indicate cue type and reason): currently using folley catheter     Functional mobility during ADLs: Moderate assistance;+2 for physical assistance;+2 for safety/equipment;Rolling walker;Cueing for sequencing General ADL Comments: pain limits access to LB for ADL and for transfers     Vision Baseline Vision/History: Wears glasses Wears Glasses: Reading only Patient Visual Report: No change from baseline     Perception  Praxis       Cognition Arousal/Alertness: Awake/alert Behavior During Therapy: WFL for tasks assessed/performed Overall Cognitive Status: Within Functional Limits for tasks assessed                                          Exercises     Shoulder Instructions       General Comments husband and nephew present throughout session    Pertinent Vitals/ Pain       Pain Assessment: 0-10 Pain Score: 9  Pain Location: Bil hip during movement Pain Descriptors / Indicators: Grimacing;Sharp;Sore Pain Intervention(s): Limited activity within patient's tolerance;Monitored during session  [place] expects to be discharged to:: Private residence Living Arrangements: Spouse/significant other Available Help at Discharge: Family;Available 24 hours/day Type of Home: House Home Access: Stairs to enter [employer] of Steps: 5 Entrance Stairs-Rails: Left Home Layout: One level     Bathroom Shower/Tub: Ambulance person: Handicapped height Bathroom Accessibility: Yes How Accessible: Accessible via walker Home Equipment: Walker - 2 wheels          Prior Functioning/Environment Level of Independence: Independent        Comments: Reports a few falls within the pas year   Frequency  Min 2X/week        Progress Toward Goals  OT Goals(current goals can now be found in the care plan section)  Progress towards OT goals: Progressing toward goals  Acute Rehab OT Goals Patient Stated Goal: get back to independent to plant my spring garden OT Goal Formulation: With patient/family Time For Goal Achievement: 08/27/18 Potential to Achieve Goals: Good ADL Goals Pt Will Perform Grooming: with modified independence Pt Will Perform Lower Body Bathing: with set-up;with adaptive equipment;sitting/lateral leans Pt Will Perform Lower Body Dressing: with min guard assist;with caregiver independent in assisting;sit to/from  stand Pt Will Transfer to Toilet: with min assist;stand pivot transfer;bedside commode Pt Will Perform Toileting - Clothing Manipulation and hygiene: sitting/lateral leans;with set-up Pt Will Perform Tub/Shower Transfer: Shower transfer;with min assist;ambulating;shower seat Additional ADL Goal #1: Pt will perform bed mobility at supervision level prior to engaging in ADL activity  Plan Discharge plan remains appropriate;Frequency remains appropriate    Co-evaluation    PT/OT/SLP Co-Evaluation/Treatment: Yes Reason for Co-Treatment: Complexity of the patient's impairments (multi-system involvement);For patient/therapist safety PT goals addressed during session: Mobility/safety with mobility OT goals addressed during session: ADL's and self-care      AM-PAC PT "6 Clicks" Daily Activity     Outcome Measure   Help from another person eating meals?: None Help from another person taking care of personal grooming?: A Little Help from another person toileting, which includes using toliet, bedpan, or urinal?: A Lot Help from another person bathing (including washing, rinsing, drying)?: A Lot Help from another person to put on and taking off regular upper body clothing?: A Little Help from another person to put on and taking off regular lower body clothing?: A Lot 6 Click Score: 16    End of Session Equipment Utilized During Treatment: Gait belt;Rolling walker  OT Visit Diagnosis: Unsteadiness on feet (R26.81);Other abnormalities of gait and mobility (R26.89);Pain Pain - Right/Left: Left(bilateral) Pain - part of body: Hip   Activity Tolerance Patient tolerated treatment well   Patient Left in bed;with call bell/phone within reach;with bed alarm set;with family/visitor present;with SCD's reapplied   Nurse Communication Mobility status;Weight bearing  status        Time: 1359-1411 OT Time Calculation (min): 12 min  Charges: OT General Charges $OT Visit: 1 Visit OT  Evaluation $OT Eval Moderate Complexity: 1 Mod OT Treatments $Self Care/Home Management : 8-22 mins  Hulda Humphrey OTR/L Acute Rehabilitation Services Pager: [phone] Office: [phone]   Merri Ray Jaran Sainz 08/13/2018, 2:48 PM

## 2018-08-13 NOTE — Progress Notes (Signed)
PROGRESS NOTE    Mackenzie Madden  TGG:[id number] DOB: Apr 13, 1928 DOA: 08/11/2018 PCP: Lavone Orn, MD    Brief Narrative:  84 y.o. female, with past medical history significant for hypertension and hypothyroidism presents status post fall at home yesterday in the bathroom during the night.  No preceding chest pains or palpitations, she just slipped.  Today the patient's started having lower back pain especially on the left side and she cannot bear weight on the left leg.  Work-up in the emergency room showed pelvic fracture with left pubic ramus fracture and a possible bladder injury.  Dr. Alyson Ingles from urology was consulted and is pending.  Assessment & Plan:   Principal Problem:   Sacral fracture, closed ([place]) Active Problems:   HTN (hypertension)   Chronic diastolic heart failure ([place])   Fall  Left pubic ramus fracture PT consulted, recommendation for CIR, consulted rehab service Pain controlled at this time Seen by Ortho in ED, recommendations for PT per above  Possible bladder injury, ruled out Urinalysis negative for hematuria Urology consulted, appreciate input. Lower suspicion for bladder injury with no further w/u noted  UTI UA reviewed, findings suggestive of UTI Continued on rocephin Afebrile No urine culture was ordered at time of admit. Have ordered now however as pt is already on abx, doubt usefulness of study  Hypertension Suboptimally controlled Continue pt on Lopressor Will add low dose lisinopril and PRN hydralazine  Hypothyroidism Continue with Synthroid  DVT Prophylaxis SCDs  DVT prophylaxis: SCD's Code Status: Full Family Communication: Pt in room,family at bedside Disposition Plan: Uncertain at this time  Consultants:   Orthopedic Surgery  Urology  CIR  Procedures:     Antimicrobials: Anti-infectives (From admission, onward)   Start     Dose/Rate Route Frequency Ordered Stop   08/11/18 2000  cefTRIAXone (ROCEPHIN) 1 g  in sodium chloride 0.9 % 100 mL IVPB     1 g 200 mL/hr over 30 Minutes Intravenous Every 24 hours 08/11/18 1908     08/11/18 1830  cefTRIAXone (ROCEPHIN) injection 1 g  Status:  Discontinued     1 g Intramuscular Every 24 hours 08/11/18 1740 08/11/18 1908      Subjective: Without complaints currently  Objective: Vitals:   08/12/18 0458 08/12/18 0900 08/12/18 2151 08/13/18 0440  BP: (!) 144/62 (!) 159/74 130/60 (!) 153/82  Pulse: 76  70 72  Resp:   16 18  Temp: 98.3 F (36.8 C)  98.7 F (37.1 C) 98.4 F (36.9 C)  TempSrc: Oral  Oral Oral  SpO2: 95%  94% 95%  Weight:      Height:        Intake/Output Summary (Last 24 hours) at 08/13/2018 1439 Last data filed at 08/13/2018 0447 Gross per 24 hour  Intake -  Output 1250 ml  Net -1250 ml   Filed Weights   08/11/18 0824  Weight: 44.5 kg    Examination: General exam: Awake, laying in bed, in nad Respiratory system: Normal respiratory effort, no wheezing Cardiovascular system: regular rate, s1, s2 Gastrointestinal system: Soft, nondistended, positive BS Central nervous system: CN2-12 grossly intact, strength intact Extremities: Perfused, no clubbing Skin: Normal skin turgor, no notable skin lesions seen Psychiatry: Mood normal // no visual hallucinations   Data Reviewed: I have personally reviewed following labs and imaging studies  CBC: Recent Labs  Lab 08/11/18 1534 08/11/18 1613  WBC 11.1*  --   NEUTROABS 8.0*  --   HGB 13.2 13.3  HCT 39.5 39.0  MCV 94.5  --   PLT 142*  --    Basic Metabolic Panel: Recent Labs  Lab 08/11/18 1534 08/11/18 1613  NA 133* 137  K 3.6 3.7  CL 101 102  CO2 28  --   GLUCOSE 132* 125*  BUN 16 16  CREATININE 0.83 0.90  CALCIUM 9.3  --    GFR: Estimated Creatinine Clearance: 30.4 mL/min (by C-G formula based on SCr of 0.9 mg/dL). Liver Function Tests: No results for input(s): AST, ALT, ALKPHOS, BILITOT, PROT, ALBUMIN in the last 168 hours. No results for input(s):  LIPASE, AMYLASE in the last 168 hours. No results for input(s): AMMONIA in the last 168 hours. Coagulation Profile: No results for input(s): INR, PROTIME in the last 168 hours. Cardiac Enzymes: No results for input(s): CKTOTAL, CKMB, CKMBINDEX, TROPONINI in the last 168 hours. BNP (last 3 results) No results for input(s): PROBNP in the last 8760 hours. HbA1C: No results for input(s): HGBA1C in the last 72 hours. CBG: No results for input(s): GLUCAP in the last 168 hours. Lipid Profile: No results for input(s): CHOL, HDL, LDLCALC, TRIG, CHOLHDL, LDLDIRECT in the last 72 hours. Thyroid Function Tests: No results for input(s): TSH, T4TOTAL, FREET4, T3FREE, THYROIDAB in the last 72 hours. Anemia Panel: No results for input(s): VITAMINB12, FOLATE, FERRITIN, TIBC, IRON, RETICCTPCT in the last 72 hours. Sepsis Labs: No results for input(s): PROCALCITON, LATICACIDVEN in the last 168 hours.  No results found for this or any previous visit (from the past 240 hour(s)).   Radiology Studies: No results found.  Scheduled Meds: . brimonidine  1 drop Left Eye TID   Or  . timolol  1 drop Left Eye TID  . latanoprost  1 drop Both Eyes QHS  . levothyroxine  100 mcg Oral QAC breakfast  . metoprolol tartrate  25 mg Oral BID  . multivitamin with minerals  1 tablet Oral Daily  . senna-docusate  1 tablet Oral QHS  . sodium chloride flush  3 mL Intravenous Q12H   Continuous Infusions: . sodium chloride 10 mL/hr at 08/11/18 1800  . cefTRIAXone (ROCEPHIN)  IV 1 g (08/12/18 2105)     LOS: 2 days   Marylu Lund, MD [facility] Pager On Amion  If 7PM-7AM, please contact night-coverage 08/13/2018, 2:39 PM

## 2018-08-13 NOTE — Consult Note (Signed)
Physical Medicine and Rehabilitation Consult Reason for Consult:Decreased functional mobility Referring Physician: [facility]   HPI: Mackenzie Madden is a 84 y.o.right hand female with history of hypertension. Per chart review patient lives with spouse reported to be independent prior to admission. One level home with 5 steps to entry. Patient's niece is a physical therapist at [employer] [facility] [employer]. Presented 08/11/2018 after a fall while in the bathroom without loss of consciousness. X-rays and imaging revealed acute fracture of the left pubis extending into the adjacent medial rami, and acute fracture of the left sacral ala. The pubic fracture also associated with a 36 cm hematoma extending into the suprapubic region. Orthopedic services Dr. Ninfa Linden follow-up conservative care and wait tolerated. Hospital course pain management. Urology follow-up for question fluid level in the urinary bladder suggesting blood in the bladder with Foley to place a size to continue to monitor. TherapyEvaluation completed with recommendations of physical medicine rehabilitation consult.   Review of Systems  Constitutional: Negative for chills and fever.  HENT: Negative for hearing loss.   Eyes: Negative for blurred vision and double vision.  Respiratory: Negative for cough and shortness of breath.   Cardiovascular: Negative for chest pain, palpitations and leg swelling.  Gastrointestinal: Positive for constipation. Negative for nausea and vomiting.  Genitourinary: Negative for dysuria, flank pain and hematuria.  Musculoskeletal: Positive for falls, joint pain and myalgias.  Skin: Negative for rash.  All other systems reviewed and are negative.  Past Medical History:  Diagnosis Date  . Hypertension   . Thyroid disease    Past Surgical History:  Procedure Laterality Date  . SPLENECTOMY, TOTAL     Family History  Problem Relation Age of Onset  . CAD Mother    Social History:  reports that she  has never smoked. She has never used smokeless tobacco. She reports that she does not drink alcohol or use drugs. Allergies: No Known Allergies Medications Prior to Admission  Medication Sig Dispense Refill  . brimonidine-timolol (COMBIGAN) 0.2-0.5 % ophthalmic solution Place 1 drop into the left eye 3 (three) times daily.     . Calcium Carb-Cholecalciferol (CALCIUM 600 + D PO) Take 1,200 mg by mouth at bedtime.    Marland Kitchen latanoprost (XALATAN) 0.005 % ophthalmic solution Place 1 drop into both eyes at bedtime.  11  . levothyroxine (SYNTHROID, LEVOTHROID) 100 MCG tablet Take 100 mcg by mouth daily before breakfast.    . Multiple Vitamin (MULTIVITAMIN WITH MINERALS) TABS tablet Take 1 tablet by mouth daily.    . naproxen sodium (ANAPROX) 220 MG tablet Take 220 mg by mouth daily as needed (pain). ALEVE    . aspirin EC 81 MG tablet Take 1 tablet (81 mg total) by mouth daily. (Patient not taking: Reported on 08/11/2018) 30 tablet 0  . metoprolol tartrate (LOPRESSOR) 25 MG tablet Take 1 tablet (25 mg total) by mouth 2 (two) times daily. (Patient not taking: Reported on 08/11/2018) 60 tablet 0  . senna-docusate (SENOKOT-S) 8.6-50 MG tablet Take 1 tablet by mouth at bedtime. (Patient not taking: Reported on 08/11/2018) 30 tablet 0    Home: Home Living Family/patient expects to be discharged to:: Private residence Living Arrangements: Spouse/significant other Available Help at Discharge: Family, Available 24 hours/day Type of Home: House Home Access: Stairs to enter [employer] of Steps: 5 Entrance Stairs-Rails: Left Home Layout: One level Bathroom Shower/Tub: Tub/shower unit, Door, Holiday representative Toilet: Handicapped height Bathroom Accessibility: Yes Home Equipment: Environmental consultant - 2 wheels  Functional History: Prior Function Level of Independence: Independent Comments: Reports a few falls within the pas year Functional Status:  Mobility: Bed Mobility Overal bed mobility: Needs  Assistance Bed Mobility: Supine to Sit Supine to sit: Mod assist, +2 for physical assistance General bed mobility comments: vc for step by step sequencing - going towards Right side for pain management, mod A to bring hips EOB utilizing slight bridging, heavy mod for trunk elevation, and use of the bed pad to bring hips EOB Transfers Overall transfer level: Needs assistance Equipment used: Rolling walker (2 wheeled) Transfers: Sit to/from Stand, [employer] Transfers Sit to Stand: Max assist, +2 physical assistance, +2 safety/equipment Stand pivot transfers: Mod assist, +2 physical assistance, +2 safety/equipment General transfer comment: max A +2 for initial boost from EOB, with vc able to sequence pivot to chair with a few steps forward first      ADL: ADL Overall ADL's : Needs assistance/impaired Eating/Feeding: Modified independent, Sitting Grooming: Set up, Sitting Grooming Details (indicate cue type and reason): Pt currently unable to tolerate extended standing and is dependent on BUE in standing for balance Upper Body Bathing: Set up, Sitting Lower Body Bathing: Maximal assistance Upper Body Dressing : Set up, Sitting Lower Body Dressing: Maximal assistance, +2 for physical assistance, +2 for safety/equipment, Sit to/from stand Toilet Transfer: Moderate assistance, Maximal assistance, +2 for physical assistance, Stand-pivot(initial stand max A, pivot mod A) Toilet Transfer Details (indicate cue type and reason): simulated through recliner transfer Toileting- Clothing Manipulation and Hygiene: Maximal assistance, +2 for physical assistance, +2 for safety/equipment, Sit to/from stand Toileting - Clothing Manipulation Details (indicate cue type and reason): spreading her legs causes spasms and is very painful Functional mobility during ADLs: Moderate assistance, +2 for physical assistance, +2 for safety/equipment, Rolling walker, Cueing for sequencing General ADL Comments: pain  limits access to LB for ADL and for transfers  Cognition: Cognition Overall Cognitive Status: Within Functional Limits for tasks assessed Cognition Arousal/Alertness: Awake/alert Behavior During Therapy: WFL for tasks assessed/performed Overall Cognitive Status: Within Functional Limits for tasks assessed  Blood pressure (!) 153/82, pulse 72, temperature 98.4 F (36.9 C), temperature source Oral, resp. rate 18, height 5' (1.524 m), weight 44.5 kg, SpO2 95 %. Physical Exam  Vitals reviewed. Constitutional: She is oriented to person, place, and time.  Genitourinary:  Genitourinary Comments: Urine in foley dark yellow  Musculoskeletal:  Bilateral pelvic pain with AROM/PROM of both lower ext.   Neurological: She is alert and oriented to person, place, and time.  Patient is alert. Sitting up in chair. Oriented to person place and time. UE 5/5. LE: 2/5 HF, 3/5 KE and 4+/5 ADF/PF. No focal sensory findings. Cognitively intact  Psychiatric: She has a normal mood and affect. Her behavior is normal.    No results found for this or any previous visit (from the past 24 hour(s)). Mr Pelvis Wo Contrast  Result Date: 08/11/2018 CLINICAL DATA:  Fall yesterday, left hip pain. EXAM: MRI PELVIS WITHOUT CONTRAST TECHNIQUE: Multiplanar multisequence MR imaging of the pelvis was performed. No intravenous contrast was administered. COMPARISON:  Radiographs from 08/11/2018 FINDINGS: Urinary Tract: There is a fluid level in the urinary bladder, suspicious for hematocrit level/blood in the urinary bladder. Bowel:  Mild sigmoid colon diverticulosis. Vascular/Lymphatic: Unremarkable Reproductive:  The uterus is absent.  Adnexa unremarkable. Other: Prominently dilated distal common bile duct as shown on MRI from 06/06/2016. There could be a small stone in the distal CBD on image 3/7 measuring about 2 mm in diameter  right kidney lower pole cyst. Musculoskeletal: Osseous: Acute nondisplaced fracture of the left sacral  ala extending vertically. Subtle edema in both SI joints. Acute fractures of the left pubic body extending into the adjacent medial portion of the rami. No definite right hemi pelvic fractures are identified. The pubic fracture is associated with a 8.3 by 3.6 by 2.3 cm (volume = 36 cm^3) hematoma extending from the suprapubic region into the space of Retzius. Edema tracks along periureteral fascia planes. There is notable muscular edema within along the hip adductor musculature on the left. The left adductor aponeurosis attaches to the region of fracture in the left pubis, as does the left rectus abdominus aponeurosis. Edema tracks within along the left obturator internus muscle and left pelvic sidewall. IMPRESSION: 1. Acute fracture of the left pubis extending into the adjacent medial rami, and acute fracture of the left sacral ala. The pubic fracture is associated with a 36 cubic cm hematoma extending in the space of Retzius and suprapubic region, as well as notable edema tracking within the hip adductor musculature, along the obturator internus, and along the left pelvic sidewall and periureteral fascia planes. 2. There is a fluid-level in the urinary bladder suggesting blood in the urinary bladder which in turn raises the possibility of a bladder injury. I do not see definite bladder wall irregularity to suggest a bladder rupture, but a low threshold for further bladder workup is suggested given the apparent hematocrit level. 3. Mild sigmoid colon diverticulosis. 4. Chronic dilated distal common bile duct. Questionable 2 mm stone in the distal CBD, not causing current obstruction. Electronically Signed   By: Van Clines M.D.   On: 08/11/2018 15:02     Assessment/Plan: Diagnosis: left pubic ramus, left sacral ala fractures with associated hematoma and bladder trauma after fall on 11/10 1. Does the need for close, 24 hr/day medical supervision in concert with the patient's rehab needs make it  unreasonable for this patient to be served in a less intensive setting? Yes 2. Co-Morbidities requiring supervision/potential complications: bladder/foley mgt, pain control, constipation, HTN 3. Due to bladder management, bowel management, safety, skin/wound care, disease management, medication administration, pain management and patient education, does the patient require 24 hr/day rehab nursing? Yes 4. Does the patient require coordinated care of a physician, rehab nurse, PT (1-2 hrs/day, 5 days/week) and OT (1-2 hrs/day, 5 days/week) to address physical and functional deficits in the context of the above medical diagnosis(es)? Yes Addressing deficits in the following areas: balance, endurance, locomotion, strength, transferring, bowel/bladder control, bathing, dressing, feeding, grooming, toileting and psychosocial support 5. Can the patient actively participate in an intensive therapy program of at least 3 hrs of therapy per day at least 5 days per week? Yes 6. The potential for patient to make measurable gains while on inpatient rehab is excellent 7. Anticipated functional outcomes upon discharge from inpatient rehab are modified independent and supervision  with PT, modified independent and supervision with OT, n/a with SLP. 8. Estimated rehab length of stay to reach the above functional goals is: 7-10 days 9. Anticipated D/C setting: Home 10. Anticipated post D/C treatments: HH therapy and Outpatient therapy 11. Overall Rehab/Functional Prognosis: excellent  RECOMMENDATIONS: This patient's condition is appropriate for continued rehabilitative care in the following setting: CIR Patient has agreed to participate in recommended program. Yes Note that insurance prior authorization may be required for reimbursement for recommended care.  Comment: Patient was active and independent without a device prior to fall. She is motivated to  return home as soon as possible. Rehab Admissions Coordinator to  follow up.  Thanks,  Meredith Staggers, MD, Mellody Drown  I have personally performed a face to face diagnostic evaluation of this patient. Additionally, I have reviewed and concur with the physician assistant's documentation above.    Lavon Paganini Angiulli, PA-C 08/13/2018

## 2018-08-13 NOTE — Care Management CC44 (Signed)
Condition Code 44 Documentation Completed  Patient Details  Name: Mackenzie Madden MRN: [id number] Date of Birth: 11-Jan-1928   Condition Code 44 given:  Yes Patient signature on Condition Code 44 notice:  Yes Documentation of 2 MD's agreement:  Yes Code 44 added to claim:  Yes    Erenest Rasher, RN 08/13/2018, 3:28 PM

## 2018-08-14 DIAGNOSIS — S32592A Other specified fracture of left pubis, initial encounter for closed fracture: Secondary | ICD-10-CM

## 2018-08-14 DIAGNOSIS — S3210XS Unspecified fracture of sacrum, sequela: Secondary | ICD-10-CM | POA: Diagnosis not present

## 2018-08-14 DIAGNOSIS — I10 Essential (primary) hypertension: Secondary | ICD-10-CM

## 2018-08-14 DIAGNOSIS — I5032 Chronic diastolic (congestive) heart failure: Secondary | ICD-10-CM

## 2018-08-14 DIAGNOSIS — D72829 Elevated white blood cell count, unspecified: Secondary | ICD-10-CM

## 2018-08-14 DIAGNOSIS — S3210XA Unspecified fracture of sacrum, initial encounter for closed fracture: Secondary | ICD-10-CM | POA: Diagnosis not present

## 2018-08-14 LAB — URINE CULTURE: Culture: NO GROWTH

## 2018-08-14 NOTE — Progress Notes (Signed)
PROGRESS NOTE    Mackenzie Madden  LFY:[id number] DOB: 1928/06/18 DOA: 08/11/2018 PCP: Lavone Orn, MD   Brief Narrative: 84 year old female with hypertension hypothyroidism status post fall at home found to have pelvic fracture, being managed nonoperatively.    Assessment & Plan:   Principal Problem: Left pubic ramus fracture: Continue PT OT seen by orthopedic on nonoperative management.  Awaiting for inpatient rehabilitation.  Pain control.  Possible bladder injury: Seen by allergy no injury noted.  Has Foley catheter continue the same. HTN (hypertension): Blood pressure stable continue home meds  Possible UTI continue antibiotics ceftriaxone.  No urine culture ordered at the time of admission  Chronic diastolic heart failure : Compensated.  Stable continue home meds.  ([place])   Hypothyroidism:continue home Synthroid  DVT prophylaxis: SCD Code Status: Code Family Communication: Husband/daughter at the bedside Disposition Plan: inatient rehab  Consultants: orthopedic surgery  And urology  Procedures: None  Subjective: Seen this morning.  Resting comfortably.  No new complaints.  Objective: Vitals:   08/13/18 1546 08/13/18 2035 08/14/18 0400 08/14/18 1302  BP: (!) 164/66 137/62 140/73 134/62  Pulse:  90 79 74  Resp:    18  Temp:  98.7 F (37.1 C) 98.3 F (36.8 C) 98.7 F (37.1 C)  TempSrc:  Oral Oral Oral  SpO2:  95% 98% 96%  Weight:      Height:        Intake/Output Summary (Last 24 hours) at 08/14/2018 1851 Last data filed at 08/14/2018 0840 Gross per 24 hour  Intake 120 ml  Output 800 ml  Net -680 ml   Filed Weights   08/11/18 0824  Weight: 44.5 kg   Weight change:   Body mass index is 19.14 kg/m.  Examination:  General exam: Calm, comfortable not in acute distress,  Elderly HEENT:Oral mucosa moist, Ear/Nose WNL grossly Respiratory system: Bilateral equal air entry, no crackles and wheezing Cardiovascular system: S1 & S2 heard, No  JVD/murmurs.No pedal edema. Gastrointestinal system: Abdomen soft, nontender non-distended, BS +. Nervous System:Alert/awake/oriented at baseline.  Able to move UE and LE Extremities: No edema,distal peripheral pulses palpable. Skin: No rashes,no icterus. MSK: Normal muscle bulk,tone ,power  Data Reviewed: I have personally reviewed following labs and imaging studies  CBC: Recent Labs  Lab 08/11/18 1534 08/11/18 1613  WBC 11.1*  --   NEUTROABS 8.0*  --   HGB 13.2 13.3  HCT 39.5 39.0  MCV 94.5  --   PLT 142*  --    Basic Metabolic Panel: Recent Labs  Lab 08/11/18 1534 08/11/18 1613  NA 133* 137  K 3.6 3.7  CL 101 102  CO2 28  --   GLUCOSE 132* 125*  BUN 16 16  CREATININE 0.83 0.90  CALCIUM 9.3  --    GFR: Estimated Creatinine Clearance: 30.4 mL/min (by C-G formula based on SCr of 0.9 mg/dL). Liver Function Tests: No results for input(s): AST, ALT, ALKPHOS, BILITOT, PROT, ALBUMIN in the last 168 hours. No results for input(s): LIPASE, AMYLASE in the last 168 hours. No results for input(s): AMMONIA in the last 168 hours. Coagulation Profile: No results for input(s): INR, PROTIME in the last 168 hours. Cardiac Enzymes: No results for input(s): CKTOTAL, CKMB, CKMBINDEX, TROPONINI in the last 168 hours. BNP (last 3 results) No results for input(s): PROBNP in the last 8760 hours. HbA1C: No results for input(s): HGBA1C in the last 72 hours. CBG: No results for input(s): GLUCAP in the last 168 hours. Lipid Profile: No results for  input(s): CHOL, HDL, LDLCALC, TRIG, CHOLHDL, LDLDIRECT in the last 72 hours. Thyroid Function Tests: No results for input(s): TSH, T4TOTAL, FREET4, T3FREE, THYROIDAB in the last 72 hours. Anemia Panel: No results for input(s): VITAMINB12, FOLATE, FERRITIN, TIBC, IRON, RETICCTPCT in the last 72 hours. Sepsis Labs: No results for input(s): PROCALCITON, LATICACIDVEN in the last 168 hours.  Recent Results (from the past 240 hour(s))  Culture,  Urine     Status: None   Collection Time: 08/13/18  2:42 PM  Result Value Ref Range Status   Specimen Description URINE, RANDOM  Final   Special Requests NONE  Final   Culture   Final    NO GROWTH Performed at [facility], [address], [place], [place] [address]    Report Status 08/14/2018 FINAL  Final      Radiology Studies: No results found.      Scheduled Meds: . brimonidine  1 drop Left Eye TID   Or  . timolol  1 drop Left Eye TID  . latanoprost  1 drop Both Eyes QHS  . levothyroxine  100 mcg Oral QAC breakfast  . lisinopril  5 mg Oral Daily  . metoprolol tartrate  25 mg Oral BID  . multivitamin with minerals  1 tablet Oral Daily  . senna-docusate  1 tablet Oral QHS  . sodium chloride flush  3 mL Intravenous Q12H   Continuous Infusions: . sodium chloride 10 mL/hr at 08/11/18 1800  . cefTRIAXone (ROCEPHIN)  IV 1 g (08/13/18 2145)     LOS: 2 days    Time spent: More than 50% of that time was spent in counseling and/or coordination of care.      Antonieta Pert, MD [facility] Pager [phone]  If 7PM-7AM, please contact night-coverage [email or web address] Password [facility] 08/14/2018, 6:51 PM

## 2018-08-14 NOTE — Progress Notes (Signed)
Pt foley was leaking last night, Night shift RN notified me. I deflated the balloon and pushed the foley further in and re-inflated the balloon. Pt tolerated well, will continue to monitor to see if it leaks again.

## 2018-08-14 NOTE — Progress Notes (Addendum)
IP rehab admissions - I met with patient and her husband.  They would like inpatient rehab admission.  I called and sent clinicals to insurance carrier this am.  I gave patient rehab booklets.  I will follow up with all once I hear back from insurance case manager.  Call me for questions.  [phone]  I have received a denial from insurance carrier.  I will discuss this denial with patient and her family.  Please see recommendation from Dr. Posey Pronto for SNF placement. [phone]

## 2018-08-14 NOTE — Progress Notes (Signed)
Physical Therapy Treatment Patient Details Name: Mackenzie Madden MRN: [id number] DOB: 12-Jul-1928 Today's Date: 08/14/2018    History of Present Illness Mackenzie Madden  is a 84 y.o. female, with past medical history significant for hypertension and hypothyroidism presents status post fall at home in the bathroom; Sustained Acute fracture of the left pubis extending into the adjacent medial rami, and acute fracture of the left sacral ala, stable fxs, WBAT per Ortho; possible bladder injury, Urology consult pending    PT Comments    Continuing work on functional mobility and activity tolerance;  Motivated to work on mobility and walking despite significant pain L groin/hip/pelvis; noting improvements in activity tolerance, and able to walk in the room   Follow Up Recommendations  CIR     Equipment Recommendations  Rolling walker with 5" wheels;3in1 (PT)(may need youth-sized RW)    Recommendations for Other Services       Precautions / Restrictions Precautions Precautions: Fall Restrictions RLE Weight Bearing: Weight bearing as tolerated LLE Weight Bearing: Weight bearing as tolerated    Mobility  Bed Mobility Overal bed mobility: Needs Assistance Bed Mobility: Supine to Sit     Supine to sit: Mod assist;+2 for physical assistance     General bed mobility comments: vc for step by step sequencing - going towards Right side for pain management, mod A to bring hips EOB utilizing slight bridging, heavy mod for trunk elevation, and use of the bed pad to bring hips EOB  Transfers Overall transfer level: Needs assistance Equipment used: Rolling walker (2 wheeled) Transfers: Sit to/from Stand Sit to Stand: Mod assist;+2 physical assistance         General transfer comment: Heavy mod assist of 2 to power up to stand, and then mod assist to steady as pt moved hands form bed to RW  Ambulation/Gait Ambulation/Gait assistance: Mod assist;+2 safety/equipment Gait Distance  (Feet): 10 Feet Assistive device: Rolling walker (2 wheeled) Gait Pattern/deviations: Step-to pattern     General Gait Details: Light mod assist for steadying and RW progression; very good use of RW to unweigh painful LLE; tending to touchdown weight bear on LLE; very painful LLE   Stairs             Wheelchair Mobility    Modified Rankin (Stroke Patients Only)       Balance     Sitting balance-Leahy Scale: Fair       Standing balance-Leahy Scale: Poor Standing balance comment: dependent on BUE on RW and external assist                            Cognition Arousal/Alertness: Awake/alert Behavior During Therapy: WFL for tasks assessed/performed Overall Cognitive Status: Within Functional Limits for tasks assessed                                        Exercises      General Comments        Pertinent Vitals/Pain Pain Assessment: 0-10 Pain Score: 10-Worst pain ever Pain Location: Bil hip during movement, L worse than R Pain Descriptors / Indicators: Grimacing;Sharp;Sore Pain Intervention(s): Monitored during session;Premedicated before session;Repositioned    Home Living                      Prior Function  PT Goals (current goals can now be found in the care plan section) Acute Rehab PT Goals Patient Stated Goal: get back to independent to plant my spring garden PT Goal Formulation: With patient Time For Goal Achievement: 08/27/18 Potential to Achieve Goals: Good Progress towards PT goals: Progressing toward goals    Frequency    Min 5X/week      PT Plan Current plan remains appropriate    Co-evaluation              AM-PAC PT "6 Clicks" Daily Activity  Outcome Measure  Difficulty turning over in bed (including adjusting bedclothes, sheets and blankets)?: Unable Difficulty moving from lying on back to sitting on the side of the bed? : Unable Difficulty sitting down on and standing up  from a chair with arms (e.g., wheelchair, bedside commode, etc,.)?: Unable Help needed moving to and from a bed to chair (including a wheelchair)?: A Lot Help needed walking in hospital room?: A Little Help needed climbing 3-5 steps with a railing? : Total 6 Click Score: 9    End of Session Equipment Utilized During Treatment: Gait belt Activity Tolerance: Patient tolerated treatment well Patient left: in chair;with call bell/phone within reach Nurse Communication: Mobility status PT Visit Diagnosis: Unsteadiness on feet (R26.81);Other abnormalities of gait and mobility (R26.89);Repeated falls (R29.6);History of falling (Z91.81);Pain Pain - Right/Left: Left Pain - part of body: (pelvis)     Time: 8841-6606 PT Time Calculation (min) (ACUTE ONLY): 23 min  Charges:  $Gait Training: 8-22 mins $Therapeutic Activity: 8-22 mins                     Roney Marion, PT  Acute Rehabilitation Services Pager [phone] Office [place] 08/14/2018, 2:30 PM

## 2018-08-14 NOTE — Consult Note (Signed)
[facility] CM Primary Care Navigator  08/14/2018  Mackenzie Madden [birth year] [id number]   Met with patient, husband [name]) and daughter [name]) atthe bedsideto identify possible discharge needs.  Patient reportshaving a "fall at home when going to the bathroom", started having left lower back pain and cannot bear weight on the left leg. (pelvic fracture with left pubic ramus fracture)    Patientendorses Dr. Lavone Orn with [facility] at [facility] care provider.   [place] to obtain medications without difficulty.  Patientverbalized that she hasbeenmanaging herown medications at home with use of "pill box" system filled once a week.  Patientreports that she has been driving prior to admission but her husband usually provides transportation toher doctors' appointments.  Patient states that husband is the primary caregiver for her at home but she has good family support from their children as well.  Anticipated plan for discharge [place] per therapy recommendation.  Patientand family voiced understandingto callprimary careprovider'soffice whenshereturns backhomefor a post discharge follow-upvisit within1- 2 weeksor sooner if needs arise.Patient letter (with PCP's contact number) was provided as areminder.   Explained topatientand family about[facility] CM services available for health management andresourcesat homebut denied any needs or concerns at this point.  Patientand family voicedunderstanding todiscuss with primary care provider onher next visit,aboutfurther needsand assistanceinmanaging herhealthconditions (HTN)- once shereturns home.  Patientand family expressedunderstandingto seekreferral from primary care provider to [facility] care management as deemed necessary and appropriatefor anyservicesin the  nearfuture-whenshegets backhome.   [facility] care management information was provided for future needs thatpatientmay have.  Primary care provider's office is listed as providing transition of care (TOC) follow-up.   For additional questions please contact:  Edwena Felty A. Barrie Sigmund, BSN, RN-BC [facility] PRIMARY CARE Navigator Cell: [phone]

## 2018-08-14 NOTE — Progress Notes (Addendum)
 PHYSICAL MEDICINE & REHABILITATION PROGRESS NOTE  Subjective/Complaints: Patient seen sitting up in her chair, family at bedside.  She is pleased with being able to ambulate earlier today. History taken predominantly from family.   ROS: Denies CP, SOB, N/V/D.  Objective: Vital Signs: Blood pressure 134/62, pulse 74, temperature 98.7 F (37.1 C), temperature source Oral, resp. rate 18, height 5' (1.524 m), weight 44.5 kg, SpO2 96 %. No results found. Recent Labs    08/11/18 1534 08/11/18 1613  WBC 11.1*  --   HGB 13.2 13.3  HCT 39.5 39.0  PLT 142*  --    Recent Labs    08/11/18 1534 08/11/18 1613  NA 133* 137  K 3.6 3.7  CL 101 102  CO2 28  --   GLUCOSE 132* 125*  BUN 16 16  CREATININE 0.83 0.90  CALCIUM 9.3  --     Physical Exam: BP 134/62 (BP Location: Left Arm)   Pulse 74   Temp 98.7 F (37.1 C) (Oral)   Resp 18   Ht 5' (1.524 m)   Wt 44.5 kg   SpO2 96%   BMI 19.14 kg/m  Constitutional: No distress . Vital signs reviewed. HENT: Normocephalic.  Atraumatic. Eyes: EOMI. No discharge. Cardiovascular: RRR. No JVD. Respiratory: CTA Bilaterally. Normal effort. GI: BS +. Non-distended. Musc: Edema and tenderness in hips Neurology: Alert B/l UE: 4+/5 proximal to distal B/l LE: HF 3/5, KE 3+/5, ADF 4+/5 (pain inhibition) Psych: Slowed. Confused  Assessment/Plan:  1. Sacral/Pubic fractures  Cont PT/OT  Insurance denied patient for CIR, recommend SNF  2. HTN  Monitor and provide prns in accordance with increased physical exertion and pain  3 Leukocytosis  Repeat labs, cont to monitor for signs and symptoms of infection, further workup if indicated   LOS: 2 days A FACE TO FACE EVALUATION WAS PERFORMED   Lorie Phenix 08/14/2018, 1:49 PM

## 2018-08-15 ENCOUNTER — Inpatient Hospital Stay (HOSPITAL_COMMUNITY)
Admission: RE | Admit: 2018-08-15 | Payer: Medicare Other | Source: Intra-hospital | Admitting: Physical Medicine & Rehabilitation

## 2018-08-15 DIAGNOSIS — S3210XA Unspecified fracture of sacrum, initial encounter for closed fracture: Secondary | ICD-10-CM | POA: Diagnosis not present

## 2018-08-15 DIAGNOSIS — S32592A Other specified fracture of left pubis, initial encounter for closed fracture: Secondary | ICD-10-CM | POA: Diagnosis not present

## 2018-08-15 MED ORDER — CEFDINIR 300 MG PO CAPS: 300.0000 mg | ORAL_CAPSULE | Freq: Two times a day (BID) | ORAL | 0 refills | Status: DC

## 2018-08-15 MED ORDER — LISINOPRIL 5 MG PO TABS: 5.0000 mg | ORAL_TABLET | Freq: Every day | ORAL | Status: DC

## 2018-08-15 NOTE — Progress Notes (Signed)
Meredith Staggers, MD  Physician  Physical Medicine and Rehabilitation  Consult Note  Signed  Date of Service:  08/13/2018 1:38 PM       Related encounter: ED to Hosp-Admission (Current) from 08/11/2018 in [place] All Collapse All    Show:Clear all [x] Manual[x] Template[] Copied  Added by: [x] Angiulli, Lavon Paganini, PA-C[x] Meredith Staggers, MD  [] Hover for details      Physical Medicine and Rehabilitation Consult Reason for Consult:Decreased functional mobility Referring Physician: [facility]   HPI: Mackenzie Madden is a 84 y.o.right hand female with history of hypertension. Per chart review patient lives with spouse reported to be independent prior to admission. One level home with 5 steps to entry. Patient's niece is a physical therapist at [employer]. Presented 08/11/2018 after a fall while in the bathroom without loss of consciousness. X-rays and imaging revealed acute fracture of the left pubis extending into the adjacent medial rami, and acute fracture of the left sacral ala. The pubic fracture also associated with a 36 cm hematoma extending into the suprapubic region. Orthopedic services Dr. Ninfa Linden follow-up conservative care and wait tolerated. Hospital course pain management. Urology follow-up for question fluid level in the urinary bladder suggesting blood in the bladder with Foley to place a size to continue to monitor. TherapyEvaluation completed with recommendations of physical medicine rehabilitation consult.   Review of Systems  Constitutional: Negative for chills and fever.  HENT: Negative for hearing loss.   Eyes: Negative for blurred vision and double vision.  Respiratory: Negative for cough and shortness of breath.   Cardiovascular: Negative for chest pain, palpitations and leg swelling.  Gastrointestinal: Positive for constipation. Negative for nausea and vomiting.    Genitourinary: Negative for dysuria, flank pain and hematuria.  Musculoskeletal: Positive for falls, joint pain and myalgias.  Skin: Negative for rash.  All other systems reviewed and are negative.      Past Medical History:  Diagnosis Date  . Hypertension   . Thyroid disease         Past Surgical History:  Procedure Laterality Date  . SPLENECTOMY, TOTAL          Family History  Problem Relation Age of Onset  . CAD Mother    Social History:  reports that she has never smoked. She has never used smokeless tobacco. She reports that she does not drink alcohol or use drugs. Allergies: No Known Allergies       Medications Prior to Admission  Medication Sig Dispense Refill  . brimonidine-timolol (COMBIGAN) 0.2-0.5 % ophthalmic solution Place 1 drop into the left eye 3 (three) times daily.     . Calcium Carb-Cholecalciferol (CALCIUM 600 + D PO) Take 1,200 mg by mouth at bedtime.    Marland Kitchen latanoprost (XALATAN) 0.005 % ophthalmic solution Place 1 drop into both eyes at bedtime.  11  . levothyroxine (SYNTHROID, LEVOTHROID) 100 MCG tablet Take 100 mcg by mouth daily before breakfast.    . Multiple Vitamin (MULTIVITAMIN WITH MINERALS) TABS tablet Take 1 tablet by mouth daily.    . naproxen sodium (ANAPROX) 220 MG tablet Take 220 mg by mouth daily as needed (pain). ALEVE    . aspirin EC 81 MG tablet Take 1 tablet (81 mg total) by mouth daily. (Patient not taking: Reported on 08/11/2018) 30 tablet 0  . metoprolol tartrate (LOPRESSOR) 25 MG tablet Take 1 tablet (25 mg total) by mouth 2 (  two) times daily. (Patient not taking: Reported on 08/11/2018) 60 tablet 0  . senna-docusate (SENOKOT-S) 8.6-50 MG tablet Take 1 tablet by mouth at bedtime. (Patient not taking: Reported on 08/11/2018) 30 tablet 0    Home: Home Living Family/patient expects to be discharged to:: Private residence Living Arrangements: Spouse/significant other Available Help at Discharge: Family, Available  24 hours/day Type of Home: House Home Access: Stairs to enter Technical brewer of Steps: 5 Entrance Stairs-Rails: Left Home Layout: One level Bathroom Shower/Tub: Tub/shower unit, Door, Multimedia programmer: Handicapped height Bathroom Accessibility: Yes Home Equipment: Environmental consultant - 2 wheels  Functional History: Prior Function Level of Independence: Independent Comments: Reports a few falls within the pas year Functional Status:  Mobility: Bed Mobility Overal bed mobility: Needs Assistance Bed Mobility: Supine to Sit Supine to sit: Mod assist, +2 for physical assistance General bed mobility comments: vc for step by step sequencing - going towards Right side for pain management, mod A to bring hips EOB utilizing slight bridging, heavy mod for trunk elevation, and use of the bed pad to bring hips EOB Transfers Overall transfer level: Needs assistance Equipment used: Rolling walker (2 wheeled) Transfers: Sit to/from Stand, [employer] Transfers Sit to Stand: Max assist, +2 physical assistance, +2 safety/equipment Stand pivot transfers: Mod assist, +2 physical assistance, +2 safety/equipment General transfer comment: max A +2 for initial boost from EOB, with vc able to sequence pivot to chair with a few steps forward first  ADL: ADL Overall ADL's : Needs assistance/impaired Eating/Feeding: Modified independent, Sitting Grooming: Set up, Sitting Grooming Details (indicate cue type and reason): Pt currently unable to tolerate extended standing and is dependent on BUE in standing for balance Upper Body Bathing: Set up, Sitting Lower Body Bathing: Maximal assistance Upper Body Dressing : Set up, Sitting Lower Body Dressing: Maximal assistance, +2 for physical assistance, +2 for safety/equipment, Sit to/from stand Toilet Transfer: Moderate assistance, Maximal assistance, +2 for physical assistance, Stand-pivot(initial stand max A, pivot mod A) Toilet Transfer Details  (indicate cue type and reason): simulated through recliner transfer Toileting- Clothing Manipulation and Hygiene: Maximal assistance, +2 for physical assistance, +2 for safety/equipment, Sit to/from stand Toileting - Clothing Manipulation Details (indicate cue type and reason): spreading her legs causes spasms and is very painful Functional mobility during ADLs: Moderate assistance, +2 for physical assistance, +2 for safety/equipment, Rolling walker, Cueing for sequencing General ADL Comments: pain limits access to LB for ADL and for transfers  Cognition: Cognition Overall Cognitive Status: Within Functional Limits for tasks assessed Cognition Arousal/Alertness: Awake/alert Behavior During Therapy: WFL for tasks assessed/performed Overall Cognitive Status: Within Functional Limits for tasks assessed  Blood pressure (!) 153/82, pulse 72, temperature 98.4 F (36.9 C), temperature source Oral, resp. rate 18, height 5' (1.524 m), weight 44.5 kg, SpO2 95 %. Physical Exam  Vitals reviewed. Constitutional: She is oriented to person, place, and time.  Genitourinary:  Genitourinary Comments: Urine in foley dark yellow  Musculoskeletal:  Bilateral pelvic pain with AROM/PROM of both lower ext.   Neurological: She is alert and oriented to person, place, and time.  Patient is alert. Sitting up in chair. Oriented to person place and time. UE 5/5. LE: 2/5 HF, 3/5 KE and 4+/5 ADF/PF. No focal sensory findings. Cognitively intact  Psychiatric: She has a normal mood and affect. Her behavior is normal.          Assessment/Plan: Diagnosis: left pubic ramus, left sacral ala fractures with associated hematoma and bladder trauma after fall on 11/10  1. Does the need for close, 24 hr/day medical supervision in concert with the patient's rehab needs make it unreasonable for this patient to be served in a less intensive setting? Yes 2. Co-Morbidities requiring supervision/potential complications:  bladder/foley mgt, pain control, constipation, HTN 3. Due to bladder management, bowel management, safety, skin/wound care, disease management, medication administration, pain management and patient education, does the patient require 24 hr/day rehab nursing? Yes 4. Does the patient require coordinated care of a physician, rehab nurse, PT (1-2 hrs/day, 5 days/week) and OT (1-2 hrs/day, 5 days/week) to address physical and functional deficits in the context of the above medical diagnosis(es)? Yes Addressing deficits in the following areas: balance, endurance, locomotion, strength, transferring, bowel/bladder control, bathing, dressing, feeding, grooming, toileting and psychosocial support 5. Can the patient actively participate in an intensive therapy program of at least 3 hrs of therapy per day at least 5 days per week? Yes 6. The potential for patient to make measurable gains while on inpatient rehab is excellent 7. Anticipated functional outcomes upon discharge from inpatient rehab are modified independent and supervision  with PT, modified independent and supervision with OT, n/a with SLP. 8. Estimated rehab length of stay to reach the above functional goals is: 7-10 days 9. Anticipated D/C setting: Home 10. Anticipated post D/C treatments: HH therapy and Outpatient therapy 11. Overall Rehab/Functional Prognosis: excellent  RECOMMENDATIONS: This patient's condition is appropriate for continued rehabilitative care in the following setting: CIR Patient has agreed to participate in recommended program. Yes Note that insurance prior authorization may be required for reimbursement for recommended care.  Comment: Patient was active and independent without a device prior to fall. She is motivated to return home as soon as possible. Rehab Admissions Coordinator to follow up.  Thanks,  Meredith Staggers, MD, Mellody Drown  I have personally performed a face to face diagnostic evaluation of this  patient. Additionally, I have reviewed and concur with the physician assistant's documentation above.    Lavon Paganini Angiulli, PA-C 08/13/2018        Revision History                   Routing History

## 2018-08-15 NOTE — Progress Notes (Signed)
Retta Diones, RN  Rehab Admission Coordinator  Physical Medicine and Rehabilitation  PMR Pre-admission  Signed  Date of Service:  08/15/2018 11:23 AM          Signed         Show:Clear all [x] Manual[x] Template[x] Copied  Added by: [x] Retta Diones, RN  [] Hover for details PMR Admission Coordinator Pre-Admission Assessment  Patient: Mackenzie Madden is an 84 y.o., female MRN: [id number] DOB: 1928/08/16 Height: 5' (152.4 cm) Weight: 44.5 kg                                                                                                                                                  [place] [id number], but we received a denial for acute inpatient rehab admission.  Patient is now a private pay patient.  Emergency Contact Information         Contact Information    Name Relation Home Work Mobile   [place] [phone]  [place] History  Patient Admitting Diagnosis: Left pubic ramus, left sacral ala fractures with associated hematoma and bladder trauma after fall on 11/10  History of Present Illness: An 84 y.o.right handedfemalewith history of hypertension. Per chart review patient lives with spouse reported to be independent prior to admission. One level home with 5 steps to entry.Patient's niece is a physical therapist at [employer] 08/11/2018 after a fall while in the bathroom without loss of consciousness. X-rays and imaging revealed acute fracture of the left pubis extending into the adjacent medial rami, and acute fracture of the left sacral ala. The pubic fracture also associated with a 36 cm hematoma extending into the suprapubic region. Orthopedic services Dr. Ninfa Linden follow-up conservative care and wait tolerated. Hospital course pain management. Urology follow-up for question fluid level in the urinary bladder suggesting blood in the bladder with Foley to  place a size to continue to monitor. Therapy evaluations completed with recommendations of physical medicine rehabilitation consult.  Past Medical History      Past Medical History:  Diagnosis Date  . Hypertension   . Thyroid disease     Family History  family history includes CAD in her mother.  Prior Rehab/Hospitalizations:  Has the patient had major surgery during 100 days prior to admission? No  Current Medications   Current Facility-Administered Medications:  .  0.9 %  sodium chloride infusion, 250 mL, Intravenous, PRN, Merton Border, MD, Last Rate: 10 mL/hr at 08/11/18 1800 .  acetaminophen (TYLENOL) tablet 650 mg, 650 mg, Oral, Q6H PRN **OR** acetaminophen (TYLENOL) suppository 650 mg, 650 mg, Rectal, Q6H PRN, Merton Border, MD .  brimonidine (ALPHAGAN) 0.2 % ophthalmic solution 1 drop, 1 drop, Left Eye, TID, 1 drop at 08/15/18 0823 **OR** timolol (TIMOPTIC) 0.5 % ophthalmic solution 1 drop, 1 drop, Left Eye, TID,  Einar Grad, [facility], 1 drop at 08/14/18 2236 .  cefTRIAXone (ROCEPHIN) 1 g in sodium chloride 0.9 % 100 mL IVPB, 1 g, Intravenous, Q24H, Einar Grad, [facility], Last Rate: 200 mL/hr at 08/14/18 2100, 1 g at 08/14/18 2100 .  hydrALAZINE (APRESOLINE) injection 5 mg, 5 mg, Intravenous, Q4H PRN, Donne Hazel, MD, 5 mg at 08/13/18 1547 .  HYDROmorphone (DILAUDID) injection 0.5 mg, 0.5 mg, Intravenous, Q4H PRN, Hijazi, Ali, MD .  latanoprost (XALATAN) 0.005 % ophthalmic solution 1 drop, 1 drop, Both Eyes, QHS, Hijazi, Deatra Canter, MD, 1 drop at 08/14/18 2237 .  levothyroxine (SYNTHROID, LEVOTHROID) tablet 100 mcg, 100 mcg, Oral, QAC breakfast, Merton Border, MD, 100 mcg at 08/15/18 0609 .  lisinopril (PRINIVIL,ZESTRIL) tablet 5 mg, 5 mg, Oral, Daily, Donne Hazel, MD, 5 mg at 08/15/18 0825 .  metoprolol tartrate (LOPRESSOR) tablet 25 mg, 25 mg, Oral, BID, Merton Border, MD, 25 mg at 08/15/18 0825 .  multivitamin with minerals tablet 1 tablet, 1 tablet, Oral, Daily, Merton Border, MD, 1 tablet at 08/15/18 0824 .  ondansetron (ZOFRAN) injection 4 mg, 4 mg, Intravenous, Q6H PRN, Merton Border, MD .  oxyCODONE (Oxy IR/ROXICODONE) immediate release tablet 5 mg, 5 mg, Oral, Q4H PRN, Merton Border, MD, 5 mg at 08/14/18 0919 .  senna-docusate (Senokot-S) tablet 1 tablet, 1 tablet, Oral, QHS, Merton Border, MD, 1 tablet at 08/14/18 2235 .  sodium chloride flush (NS) 0.9 % injection 3 mL, 3 mL, Intravenous, Q12H, Merton Border, MD, 3 mL at 08/15/18 0826 .  sodium chloride flush (NS) 0.9 % injection 3 mL, 3 mL, Intravenous, PRN, Merton Border, MD  Patients Current Diet:     Diet Order                  Diet - low sodium heart healthy         Diet regular Room service appropriate? Yes; Fluid consistency: Thin  Diet effective now               Precautions / Restrictions Precautions Precautions: Fall Restrictions Weight Bearing Restrictions: Yes RLE Weight Bearing: Weight bearing as tolerated LLE Weight Bearing: Weight bearing as tolerated   Has the patient had 2 or more falls or a fall with injury in the past year?Yes.  Patient reports most recent fall resulting in injury and current acute hospital admission.  Prior Activity Level Community (5-7x/wk): Went out about 4 days a week.  Not driving over the past year.  Home Assistive Devices / Equipment Home Assistive Devices/Equipment: None Home Equipment: Walker - 2 wheels  Prior Device Use: Indicate devices/aids used by the patient prior to current illness, exacerbation or injury? Walker  Prior Functional Level Prior Function Level of Independence: Independent Comments: Reports a few falls within the pas year  Self Care: Did the patient need help bathing, dressing, using the toilet or eating?  Independent  Indoor Mobility: Did the patient need assistance with walking from room to room (with or without device)? Independent  Stairs: Did the patient need assistance with internal or external stairs  (with or without device)? Independent  Functional Cognition: Did the patient need help planning regular tasks such as shopping or remembering to take medications? Independent  Current Functional Level Cognition  Overall Cognitive Status: Within Functional Limits for tasks assessed    Extremity Assessment (includes Sensation/Coordination)  Upper Extremity Assessment: Overall WFL for tasks assessed  Lower Extremity Assessment: LLE deficits/detail, RLE deficits/detail RLE Deficits / Details: less painful  than left RLE Coordination: decreased gross motor LLE Deficits / Details: Grossly decr Hip ROM in all planes, limited by pain LLE Coordination: decreased gross motor, decreased fine motor    ADLs  Overall ADL's : Needs assistance/impaired Eating/Feeding: Modified independent, Sitting Grooming: Set up, Sitting Grooming Details (indicate cue type and reason): Pt currently unable to tolerate extended standing and is dependent on BUE in standing for balance Upper Body Bathing: Set up, Sitting Lower Body Bathing: Maximal assistance Upper Body Dressing : Set up, Sitting Lower Body Dressing: Maximal assistance, +2 for physical assistance, +2 for safety/equipment, Sit to/from stand Toilet Transfer: Maximal assistance, +2 for physical assistance, Stand-pivot, RW Toilet Transfer Details (indicate cue type and reason): simulated through bed to recliner tranfsre Toileting- Clothing Manipulation and Hygiene: Maximal assistance, +2 for physical assistance, +2 for safety/equipment, Sit to/from stand Toileting - Clothing Manipulation Details (indicate cue type and reason): currently using folley catheter Functional mobility during ADLs: Moderate assistance, +2 for physical assistance, +2 for safety/equipment, Rolling walker, Cueing for sequencing General ADL Comments: pain limits access to LB for ADL and for transfers    Mobility  Overal bed mobility: Needs Assistance Bed Mobility: Supine to  Sit Supine to sit: Mod assist, +2 for physical assistance Sit to supine: Max assist, +2 for physical assistance General bed mobility comments: vc for step by step sequencing - going towards Right side for pain management, mod A to bring hips EOB utilizing slight bridging, heavy mod for trunk elevation, and use of the bed pad to bring hips EOB    Transfers  Overall transfer level: Needs assistance Equipment used: Rolling walker (2 wheeled) Transfers: Sit to/from Stand Sit to Stand: Mod assist, +2 physical assistance Stand pivot transfers: Mod assist, +2 physical assistance, +2 safety/equipment General transfer comment: Heavy mod assist of 2 to power up to stand, and then mod assist to steady as pt moved hands form bed to RW    Ambulation / Gait / Stairs / Wheelchair Mobility  Ambulation/Gait Ambulation/Gait assistance: Mod assist, +2 safety/equipment Gait Distance (Feet): 10 Feet Assistive device: Rolling walker (2 wheeled) Gait Pattern/deviations: Step-to pattern General Gait Details: Light mod assist for steadying and RW progression; very good use of RW to unweigh painful LLE; tending to touchdown weight bear on LLE; very painful LLE    Posture / Balance Balance Overall balance assessment: Needs assistance Sitting-balance support: Bilateral upper extremity supported, Feet supported Sitting balance-Leahy Scale: Fair Standing balance support: Bilateral upper extremity supported Standing balance-Leahy Scale: Poor Standing balance comment: dependent on BUE on RW and external assist    Special needs/care consideration BiPAP/CPAP No CPM No Continuous Drip IV No Dialysis No     Life Vest No Oxygen No Special Bed No Trach Size No Wound Vac (area) No      Skin No                              Bowel mgmt: Last BM 08/15/18 Bladder mgmt: Voiding up on [facility] with assistance Diabetic mgmt No    Previous Home Environment Living Arrangements: Spouse/significant other Available  Help at Discharge: Family, Available 24 hours/day Type of Home: House Home Layout: One level Home Access: Stairs to enter Entrance Stairs-Rails: Left Entrance Stairs-Number of Steps: 5 Bathroom Shower/Tub: Tub/shower unit, Door, Multimedia programmer: Handicapped height Bathroom Accessibility: Yes How Accessible: Accessible via walker [place]: No  Discharge Living Setting Plans for Discharge Living Setting: Patient's  home, House, Lives with (comment)(Lives with husband.) Type of Home at Discharge: House Discharge Home Layout: One level Discharge Home Access: Stairs to enter Entrance Stairs-Number of Steps: 5 steps Discharge Bathroom Shower/Tub: Tub/shower unit, Walk-in shower, Door, Curtain(Has 2 bathrooms, so has all units in home) Discharge Bathroom Toilet: Handicapped height Discharge Bathroom Accessibility: Yes How Accessible: Accessible via walker Does the patient have any problems obtaining your medications?: No  Social/Family/Support Systems Patient Roles: Spouse, Parent(Has a husband, 2 daughters and 1 son.) Contact Information: Engineer, manufacturing systems - husband Anticipated Caregiver: Husband Anticipated Ambulance person Information: CB - husband - [phone] Ability/Limitations of Caregiver: Husband is retired and can provide supervision at home. Caregiver Availability: 24/7 Discharge Plan Discussed with Primary Caregiver: Yes Is Caregiver In Agreement with Plan?: Yes Does Caregiver/Family have Issues with Lodging/Transportation while Pt is in Rehab?: No  Goals/Additional Needs Patient/Family Goal for Rehab: PT/OT mod I and supervision goals Expected length of stay: 7-10 days Cultural Considerations: None Dietary Needs: Regular diet, thin liquids Equipment Needs: TBD Additional Information: Patient is the aunt of an employee, Ileana Ladd, who works on the inpatient rehab unit Pt/Family Agrees to Admission and willing to participate: Yes Program  Orientation Provided & Reviewed with Pt/Caregiver Including Roles  & Responsibilities: Yes  Decrease burden of Care through IP rehab admission: N/A  Possible need for SNF placement upon discharge: Not anticipated  Patient Condition: This patient's medical and functional status has changed since the consult dated: 08/13/18 in which the Rehabilitation Physician determined and documented that the patient's condition is appropriate for intensive rehabilitative care in an inpatient rehabilitation facility. See "History of Present Illness" (above) for medical update. Functional changes are:  Currently requiring mod assist +2 to ambulate 10 feet RW. Patient's medical and functional status update has been discussed with the Rehabilitation physician and patient remains appropriate for inpatient rehabilitation. Will admit to inpatient rehab today.  Preadmission Screen Completed By:  Retta Diones, 08/15/2018 12:20 PM ______________________________________________________________________   Discussed status with Dr. Naaman Plummer on 08/15/18 at 1219 and received telephone approval for admission today.  Admission Coordinator:  Retta Diones, time 1219/Date 08/15/18           Cosigned by: Meredith Staggers, MD at 08/15/2018 12:53 PM  Revision History

## 2018-08-15 NOTE — Discharge Summary (Addendum)
Physician Discharge Summary  Mackenzie Madden QPY:[id number] DOB: 03/01/1928 DOA: 08/11/2018  PCP: Lavone Orn, MD  Admit date: 08/11/2018 Discharge date: 08/15/2018  Admitted From: Home  Disposition: [place]  Recommendations for Outpatient Follow-up:  1. Follow up with PCP in 1-2 weeks 2. Please obtain BMP/CBC in one week 3. Please follow up on the following pending results:  Home Health: no Equipment/Devices:none  Discharge Condition: Stable CODE STATUS: Full code Diet recommendation: Heart Healthy   D/C Summary: 84 year old female with hypertension hypothyroidism status post fall at home. She was walking to the bathroom during the night and lost her balance and fell backward, landing on her left hip.  She had no loss of consciousness.  Patient was seen in the ER 08/11/2018 x-ray showed pubic ramus fracture, MRI was performed that showed pubic ramus fracture with a hematoma as well as sacral fracture, concern for bladder injury, Foley was placed, urology was consulted.  Patient was seen by orthopedic surgery in the ER and felt that it was stable fractures, advised mobility PT OT.  Patient doing well and pleased with scan, moved to [facility] tomorrow inpatient rehab.  She is being discharged to inpatient for further rehabilitation.  Patient's daughter and husband has verbalized understanding and in agreement with the plan of care.    Patient decided not to go rehab and decided to go home w home health which will be set up tomorrow and pt will go home tomorrow.  Discharge Diagnoses:   Left pubic ramus fracture/sacral fracture:stable, continue PT OT seen by orthopedic on nonoperative management.     Patient will go to inpatient rehab for further PT OT.She is ambulatory.  Pelvic hematoma, per urology does not suspect any bladder injury.  She has a Foley catheter which can be discontinued today or tomorrow once she is more mobile   HTN (hypertension): Blood pressure stable continue  home meds, low-dose lisinopril was added, monitor the blood pressure and adjust the medications.  Possible UTI .No urine culture ordered at the time of admission. Change to p.o. antibiotics to complete the short  Course on d/c.  Chronic diastolic heart failure : Compensated.  Stable continue home meds.   Hypothyroidism:continue home Synthroid   Discharge Instructions Follow-up with PCP/orthopedic surgery Discharge Instructions    Diet - low sodium heart healthy   Complete by:  As directed    Increase activity slowly   Complete by:  As directed         Durable Medical Equipment  (From admission, onward)         Start     Ordered   08/15/18 1625  For home use only DME lightweight manual wheelchair with seat cushion  Once    Comments:  Patient suffers from pubis fracture/sacral fracture which impairs their ability to perform daily activities like standing, bathing, walking in the home.  A rolling walker, cane will not resolve  issue with performing activities of daily living. A wheelchair will allow patient to safely perform daily activities. Patient is not able to propel themselves in the home using a standard weight wheelchair due to weakness, pain. Patient can self propel in the lightweight wheelchair.  Accessories: elevating leg rests (ELRs), wheel locks, extensions and anti-tippers.   08/15/18 1625   08/15/18 1621  For home use only DME Other see comment  Once    Comments:  Transfer board   08/15/18 1620   08/15/18 1620  For home use only DME Other see comment  Once  Comments:  Bed table   08/15/18 1620   08/15/18 1617  For home use only DME Hospital bed  Once    Comments:  Full electric, bed table  Question Answer Comment  Patient has (list medical condition): left pubic ramus fracture/sacral fracture   The above medical condition requires: Patient requires the ability to reposition immediately   Head must be elevated greater than: Other see comments   Bed type  Semi-electric   Hoyer Lift Yes   Trapeze Bar Yes      08/15/18 1619          Allergies as of 08/15/2018   No Known Allergies     Medication List    TAKE these medications   aspirin EC 81 MG tablet Take 1 tablet (81 mg total) by mouth daily.   brimonidine-timolol 0.2-0.5 % ophthalmic solution Commonly known as:  COMBIGAN Place 1 drop into the left eye 3 (three) times daily.   CALCIUM 600 + D PO Take 1,200 mg by mouth at bedtime.   cefdinir 300 MG capsule Commonly known as:  OMNICEF Take 1 capsule (300 mg total) by mouth 2 (two) times daily for 3 days.   latanoprost 0.005 % ophthalmic solution Commonly known as:  XALATAN Place 1 drop into both eyes at bedtime.   levothyroxine 100 MCG tablet Commonly known as:  SYNTHROID, LEVOTHROID Take 100 mcg by mouth daily before breakfast.   lisinopril 5 MG tablet Commonly known as:  PRINIVIL,ZESTRIL Take 1 tablet (5 mg total) by mouth daily. Start taking on:  08/16/2018   metoprolol tartrate 25 MG tablet Commonly known as:  LOPRESSOR Take 1 tablet (25 mg total) by mouth 2 (two) times daily.   multivitamin with minerals Tabs tablet Take 1 tablet by mouth daily.   naproxen sodium 220 MG tablet Commonly known as:  ALEVE Take 220 mg by mouth daily as needed (pain). ALEVE   senna-docusate 8.6-50 MG tablet Commonly known as:  Senokot-S Take 1 tablet by mouth at bedtime.       No Known Allergies  Consultations:  Orthopedic surgery  Rehab medicine   Procedures/Studies: Mr Pelvis Wo Contrast  Result Date: 08/11/2018 CLINICAL DATA:  Fall yesterday, left hip pain. EXAM: MRI PELVIS WITHOUT CONTRAST TECHNIQUE: Multiplanar multisequence MR imaging of the pelvis was performed. No intravenous contrast was administered. COMPARISON:  Radiographs from 08/11/2018 FINDINGS: Urinary Tract: There is a fluid level in the urinary bladder, suspicious for hematocrit level/blood in the urinary bladder. Bowel:  Mild sigmoid colon  diverticulosis. Vascular/Lymphatic: Unremarkable Reproductive:  The uterus is absent.  Adnexa unremarkable. Other: Prominently dilated distal common bile duct as shown on MRI from 06/06/2016. There could be a small stone in the distal CBD on image 3/7 measuring about 2 mm in diameter right kidney lower pole cyst. Musculoskeletal: Osseous: Acute nondisplaced fracture of the left sacral ala extending vertically. Subtle edema in both SI joints. Acute fractures of the left pubic body extending into the adjacent medial portion of the rami. No definite right hemi pelvic fractures are identified. The pubic fracture is associated with a 8.3 by 3.6 by 2.3 cm (volume = 36 cm^3) hematoma extending from the suprapubic region into the space of Retzius. Edema tracks along periureteral fascia planes. There is notable muscular edema within along the hip adductor musculature on the left. The left adductor aponeurosis attaches to the region of fracture in the left pubis, as does the left rectus abdominus aponeurosis. Edema tracks within along the left obturator internus  muscle and left pelvic sidewall. IMPRESSION: 1. Acute fracture of the left pubis extending into the adjacent medial rami, and acute fracture of the left sacral ala. The pubic fracture is associated with a 36 cubic cm hematoma extending in the space of Retzius and suprapubic region, as well as notable edema tracking within the hip adductor musculature, along the obturator internus, and along the left pelvic sidewall and periureteral fascia planes. 2. There is a fluid-level in the urinary bladder suggesting blood in the urinary bladder which in turn raises the possibility of a bladder injury. I do not see definite bladder wall irregularity to suggest a bladder rupture, but a low threshold for further bladder workup is suggested given the apparent hematocrit level. 3. Mild sigmoid colon diverticulosis. 4. Chronic dilated distal common bile duct. Questionable 2 mm stone  in the distal CBD, not causing current obstruction. Electronically Signed   By: Van Clines M.D.   On: 08/11/2018 15:02   Dg Hip Unilat W Or Wo Pelvis 2-3 Views Left  Result Date: 08/11/2018 CLINICAL DATA:  84 year old female with left hip pain after falling yesterday EXAM: DG HIP (WITH OR WITHOUT PELVIS) 2-3V LEFT COMPARISON:  None. FINDINGS: Subtle lucency running obliquely across the parasymphyseal left superior pubic ramus. Suspect a nondisplaced fracture involving the left superior pubic ramus in the parasymphyseal region. There is a moderate bilateral hip joint degenerative osteoarthritis as evidenced by narrowing of the joint space and prominent osteophyte formation. No definite acute fracture of the left femoral neck although evaluation is somewhat limited by the degree of osteophyte formation. The remainder of the bony pelvis appears intact. Atherosclerotic calcifications visualized in the iliac and femoral vessels bilaterally. Unremarkable bowel gas pattern. IMPRESSION: 1. Suspect nondisplaced left parasymphyseal pubic ramus fracture. Recommend correlation for left inguinal/pubic tenderness to palpation. 2. No definite acute fracture of the left femur although evaluation is somewhat limited by the degree of osteophytes at the femoral head neck junction. If clinical concern persists for left proximal femoral fracture, consider further evaluation with MRI of the pelvis. 3. Moderate bilateral hip joint osteoarthritis with prominent osteophyte formation. 4.  Aortic Atherosclerosis (ICD10-170.0). Electronically Signed   By: Jacqulynn Cadet M.D.   On: 08/11/2018 09:41     Subjective: Resting comfortably.  No new complaints.  Eager to go to rehabilitation  Discharge Exam: Vitals:   08/14/18 2232 08/15/18 0823  BP: 129/71 (!) 164/71  Pulse: 79   Resp: 18   Temp: 98.7 F (37.1 C)   SpO2: 97%    Vitals:   08/14/18 0400 08/14/18 1302 08/14/18 2232 08/15/18 0823  BP: 140/73 134/62  129/71 (!) 164/71  Pulse: 79 74 79   Resp:  18 18   Temp: 98.3 F (36.8 C) 98.7 F (37.1 C) 98.7 F (37.1 C)   TempSrc: Oral Oral Oral   SpO2: 98% 96% 97%   Weight:      Height:        General: Pt is alert, awake, not in acute distress Cardiovascular: RRR, S1/S2 +, no rubs, no gallops Respiratory: CTA bilaterally, no wheezing, no rhonchi Abdominal: Soft, NT, ND, bowel sounds + Extremities: no edema, no cyanosis. Foley +   The results of significant diagnostics from this hospitalization (including imaging, microbiology, ancillary and laboratory) are listed below for reference.     Microbiology: Recent Results (from the past 240 hour(s))  Culture, Urine     Status: None   Collection Time: 08/13/18  2:42 PM  Result Value Ref Range  Status   Specimen Description URINE, RANDOM  Final   Special Requests NONE  Final   Culture   Final    NO GROWTH Performed at [facility], [address], [place], [place] [address]    Report Status 08/14/2018 FINAL  Final     Labs: BNP (last 3 results) No results for input(s): BNP in the last 8760 hours. Basic Metabolic Panel: Recent Labs  Lab 08/11/18 1534 08/11/18 1613  NA 133* 137  K 3.6 3.7  CL 101 102  CO2 28  --   GLUCOSE 132* 125*  BUN 16 16  CREATININE 0.83 0.90  CALCIUM 9.3  --    Liver Function Tests: No results for input(s): AST, ALT, ALKPHOS, BILITOT, PROT, ALBUMIN in the last 168 hours. No results for input(s): LIPASE, AMYLASE in the last 168 hours. No results for input(s): AMMONIA in the last 168 hours. CBC: Recent Labs  Lab 08/11/18 1534 08/11/18 1613  WBC 11.1*  --   NEUTROABS 8.0*  --   HGB 13.2 13.3  HCT 39.5 39.0  MCV 94.5  --   PLT 142*  --    Cardiac Enzymes: No results for input(s): CKTOTAL, CKMB, CKMBINDEX, TROPONINI in the last 168 hours. BNP: Invalid input(s): POCBNP CBG: No results for input(s): GLUCAP in the last 168 hours. D-Dimer No results for input(s): DDIMER in the last 72  hours. Hgb A1c No results for input(s): HGBA1C in the last 72 hours. Lipid Profile No results for input(s): CHOL, HDL, LDLCALC, TRIG, CHOLHDL, LDLDIRECT in the last 72 hours. Thyroid function studies No results for input(s): TSH, T4TOTAL, T3FREE, THYROIDAB in the last 72 hours.  Invalid input(s): FREET3 Anemia work up No results for input(s): VITAMINB12, FOLATE, FERRITIN, TIBC, IRON, RETICCTPCT in the last 72 hours. Urinalysis    Component Value Date/Time   COLORURINE YELLOW 08/11/2018 1534   APPEARANCEUR CLOUDY (A) 08/11/2018 1534   LABSPEC 1.010 08/11/2018 1534   PHURINE 8.0 08/11/2018 1534   GLUCOSEU NEGATIVE 08/11/2018 1534   HGBUR MODERATE (A) 08/11/2018 1534   BILIRUBINUR NEGATIVE 08/11/2018 1534   KETONESUR NEGATIVE 08/11/2018 1534   PROTEINUR NEGATIVE 08/11/2018 1534   UROBILINOGEN 0.2 02/21/2015 1558   NITRITE POSITIVE (A) 08/11/2018 1534   LEUKOCYTESUR LARGE (A) 08/11/2018 1534   Sepsis Labs Invalid input(s): PROCALCITONIN,  WBC,  LACTICIDVEN Microbiology Recent Results (from the past 240 hour(s))  Culture, Urine     Status: None   Collection Time: 08/13/18  2:42 PM  Result Value Ref Range Status   Specimen Description URINE, RANDOM  Final   Special Requests NONE  Final   Culture   Final    NO GROWTH Performed at [facility], [place] [address] [place], [place] [address]    Report Status 08/14/2018 FINAL  Final     Time coordinating discharge: 25 minutes  SIGNED:   Antonieta Pert, MD  [facility] 08/15/2018, 11:58 AM Pager [phone]  If 7PM-7AM, please contact night-coverage [email or web address] Password [email or web address]

## 2018-08-15 NOTE — Plan of Care (Signed)
  Problem: Education: Goal: Knowledge of General Education information will improve Description Including pain rating scale, medication(s)/side effects and non-pharmacologic comfort measures Outcome: Progressing   Problem: Clinical Measurements: Goal: Ability to maintain clinical measurements within normal limits will improve Outcome: Progressing   Problem: Activity: Goal: Risk for activity intolerance will decrease Outcome: Progressing   Problem: Elimination: Goal: Will not experience complications related to urinary retention Outcome: Progressing   Problem: Safety: Goal: Ability to remain free from injury will improve Outcome: Progressing

## 2018-08-15 NOTE — Progress Notes (Signed)
Patient assisted to [facility]. Voided 200cc of clear yellow urine. No c/o pain or burning upon urination. Patient transferring to IP 4W17. Report called in to [facility].

## 2018-08-15 NOTE — Plan of Care (Signed)
  Problem: Education: Goal: Knowledge of General Education information will improve Description: Including pain rating scale, medication(s)/side effects and non-pharmacologic comfort measures Outcome: Progressing   Problem: Health Behavior/Discharge Planning: Goal: Ability to manage health-related needs will improve Outcome: Progressing   Problem: Clinical Measurements: Goal: Will remain free from infection Outcome: Progressing   

## 2018-08-15 NOTE — Progress Notes (Signed)
IP rehab admissions - I met with patient and her husband today.  I explained that rehab MD did not appeal the decision due to lack of medical necessity for an inpatient rehab stay.  Family want to come to rehab and pay privately.  The estimated LOS is 7-10 days and the cost is estimated at $33,000 if patient stays the full 10 days.  Patient has signed a letter explaining costs and I have walked her husband down to the admitting office to make an initial payment to the hospital for costs.  Patient and husband are very happy to private pay as they want patient to come to CIR.  I did go over options of other rehab such as SNF or [place] therapies, but patient/husband want CIR.  Bed available and will admit to CIR today.  I have called attending MD and he has cleared patient medically for admission to CIR.  Call me for questions.  [phone]

## 2018-08-15 NOTE — Care Management Note (Addendum)
Case Management Note  Patient Details  Name: Mackenzie Madden MRN: [id number] Date of Birth: 11-Mar-1928  Subjective/Objective:     left pubic ramus fracture/sacral fracture, UTI               Action/Plan: Pt is requesting dc to IP rehab and wants to pay privately, IP rehab coordinator working with family. Pt and husband are refusing SNF. NCM did speak to pt on admission about HH. Pt/husband's preference is IP rehab.   08/15/2018 2:57 pm NCM spoke to pt's dtr at bedside. Dtr states pt prefers IP rehab for 6-8 days. Explained [facility] and pt has a Lobbyist. States she was reviewing LTC policy and what is covered. Explained policy may assist with out of pocket cost for IP rehab.  Pt requested to go home with [facility]. Will need hospital bed, wheelchair, transfer board, and table. Faxed orders to [facility] for dc home tomorrow. Offered choice for HH/list provided. Pt/husband agreeable to [facility] for [facility]. Family to hire 24 hour caregiver in the home.   PTAR arranged.  Expected Discharge Date:  08/15/18               Expected Discharge Plan:  IP Rehab Facility  In-House Referral:  Clinical Social Work  Discharge planning Services  CM Consult  Post Acute Care Choice:  NA Choice offered to:  NA  DME Arranged:  N/A DME Agency:  NA  HH Arranged:  NA HH Agency:  NA  Status of Service:  Completed, signed off  If discussed at [employer], dates discussed:    Additional Comments:  Erenest Rasher, RN 08/15/2018, 12:49 PM

## 2018-08-15 NOTE — Progress Notes (Signed)
CSW consulted with patient and husband at bedside. They both report they would like to decline SNF at this time, and move forward with IP rehab. CSW explained the denial, patient and husband report they would like to appeal. If continued to be denied they would like to pay out of pocket costs to have patient in IP rehab vs going to a SNF.   CSW notified CIR who will be meeting with patient and husband this morning to discuss options of appeal and out of pocket costs associated with IP rehab.   [place], [place]

## 2018-08-15 NOTE — Progress Notes (Signed)
F/C d/c per MD verbal order. 650cc of clear yellow urine noted.  Patient is due to void.

## 2018-08-15 NOTE — PMR Pre-admission (Signed)
PMR Admission Coordinator Pre-Admission Assessment  Patient: Mackenzie Madden is an 84 y.o., female MRN: [id number] DOB: 10/03/1927 Height: 5' (152.4 cm) Weight: 44.5 kg              [facility] [id number], but we received a denial for acute inpatient rehab admission.  Patient is now a private pay patient.  Emergency Contact Information Contact Information    Name Relation Home Work Mobile   [place] [phone]  [place] History  Patient Admitting Diagnosis: Left pubic ramus, left sacral ala fractures with associated hematoma and bladder trauma after fall on 11/10  History of Present Illness:  An 84 y.o.right handed female with history of hypertension. Per chart review patient lives with spouse reported to be independent prior to admission. One level home with 5 steps to entry. Patient's niece is a physical therapist at [employer]. Presented 08/11/2018 after a fall while in the bathroom without loss of consciousness. X-rays and imaging revealed acute fracture of the left pubis extending into the adjacent medial rami, and acute fracture of the left sacral ala. The pubic fracture also associated with a 36 cm hematoma extending into the suprapubic region. Orthopedic services Dr. Ninfa Linden follow-up conservative care and wait tolerated. Hospital course pain management. Urology follow-up for question fluid level in the urinary bladder suggesting blood in the bladder with Foley to place a size to continue to monitor. Therapy evaluations completed with recommendations of physical medicine rehabilitation consult.  Past Medical History  Past Medical History:  Diagnosis Date  . Hypertension   . Thyroid disease     Family History  family history includes CAD in her mother.  Prior Rehab/Hospitalizations:  Has the patient had major surgery during 100 days prior to admission? No  Current Medications   Current  Facility-Administered Medications:  .  0.9 %  sodium chloride infusion, 250 mL, Intravenous, PRN, Merton Border, MD, Last Rate: 10 mL/hr at 08/11/18 1800 .  acetaminophen (TYLENOL) tablet 650 mg, 650 mg, Oral, Q6H PRN **OR** acetaminophen (TYLENOL) suppository 650 mg, 650 mg, Rectal, Q6H PRN, Merton Border, MD .  brimonidine (ALPHAGAN) 0.2 % ophthalmic solution 1 drop, 1 drop, Left Eye, TID, 1 drop at 08/15/18 0823 **OR** timolol (TIMOPTIC) 0.5 % ophthalmic solution 1 drop, 1 drop, Left Eye, TID, Einar Grad, RPH, 1 drop at 08/14/18 2236 .  cefTRIAXone (ROCEPHIN) 1 g in sodium chloride 0.9 % 100 mL IVPB, 1 g, Intravenous, Q24H, Einar Grad, [facility], Last Rate: 200 mL/hr at 08/14/18 2100, 1 g at 08/14/18 2100 .  hydrALAZINE (APRESOLINE) injection 5 mg, 5 mg, Intravenous, Q4H PRN, Donne Hazel, MD, 5 mg at 08/13/18 1547 .  HYDROmorphone (DILAUDID) injection 0.5 mg, 0.5 mg, Intravenous, Q4H PRN, Hijazi, Ali, MD .  latanoprost (XALATAN) 0.005 % ophthalmic solution 1 drop, 1 drop, Both Eyes, QHS, Hijazi, Deatra Canter, MD, 1 drop at 08/14/18 2237 .  levothyroxine (SYNTHROID, LEVOTHROID) tablet 100 mcg, 100 mcg, Oral, QAC breakfast, Merton Border, MD, 100 mcg at 08/15/18 0609 .  lisinopril (PRINIVIL,ZESTRIL) tablet 5 mg, 5 mg, Oral, Daily, Donne Hazel, MD, 5 mg at 08/15/18 0825 .  metoprolol tartrate (LOPRESSOR) tablet 25 mg, 25 mg, Oral, BID, Merton Border, MD, 25 mg at 08/15/18 0825 .  multivitamin with minerals tablet 1 tablet, 1 tablet, Oral, Daily, Merton Border, MD, 1 tablet at 08/15/18 0824 .  ondansetron (ZOFRAN) injection 4 mg, 4 mg, Intravenous, Q6H PRN, Merton Border,  MD .  oxyCODONE (Oxy IR/ROXICODONE) immediate release tablet 5 mg, 5 mg, Oral, Q4H PRN, Merton Border, MD, 5 mg at 08/14/18 0919 .  senna-docusate (Senokot-S) tablet 1 tablet, 1 tablet, Oral, QHS, Merton Border, MD, 1 tablet at 08/14/18 2235 .  sodium chloride flush (NS) 0.9 % injection 3 mL, 3 mL, Intravenous, Q12H, Merton Border, MD, 3 mL at  08/15/18 0826 .  sodium chloride flush (NS) 0.9 % injection 3 mL, 3 mL, Intravenous, PRN, Merton Border, MD  Patients Current Diet:  Diet Order            Diet - low sodium heart healthy        Diet regular Room service appropriate? Yes; Fluid consistency: Thin  Diet effective now              Precautions / Restrictions Precautions Precautions: Fall Restrictions Weight Bearing Restrictions: Yes RLE Weight Bearing: Weight bearing as tolerated LLE Weight Bearing: Weight bearing as tolerated   Has the patient had 2 or more falls or a fall with injury in the past year?Yes.  Patient reports most recent fall resulting in injury and current acute hospital admission.  Prior Activity Level Community (5-7x/wk): Went out about 4 days a week.  Not driving over the past year.  Home Assistive Devices / Equipment Home Assistive Devices/Equipment: None Home Equipment: Walker - 2 wheels  Prior Device Use: Indicate devices/aids used by the patient prior to current illness, exacerbation or injury? Walker  Prior Functional Level Prior Function Level of Independence: Independent Comments: Reports a few falls within the pas year  Self Care: Did the patient need help bathing, dressing, using the toilet or eating?  Independent  Indoor Mobility: Did the patient need assistance with walking from room to room (with or without device)? Independent  Stairs: Did the patient need assistance with internal or external stairs (with or without device)? Independent  Functional Cognition: Did the patient need help planning regular tasks such as shopping or remembering to take medications? Independent  Current Functional Level Cognition  Overall Cognitive Status: Within Functional Limits for tasks assessed    Extremity Assessment (includes Sensation/Coordination)  Upper Extremity Assessment: Overall WFL for tasks assessed  Lower Extremity Assessment: LLE deficits/detail, RLE deficits/detail RLE  Deficits / Details: less painful than left RLE Coordination: decreased gross motor LLE Deficits / Details: Grossly decr Hip ROM in all planes, limited by pain LLE Coordination: decreased gross motor, decreased fine motor    ADLs  Overall ADL's : Needs assistance/impaired Eating/Feeding: Modified independent, Sitting Grooming: Set up, Sitting Grooming Details (indicate cue type and reason): Pt currently unable to tolerate extended standing and is dependent on BUE in standing for balance Upper Body Bathing: Set up, Sitting Lower Body Bathing: Maximal assistance Upper Body Dressing : Set up, Sitting Lower Body Dressing: Maximal assistance, +2 for physical assistance, +2 for safety/equipment, Sit to/from stand Toilet Transfer: Maximal assistance, +2 for physical assistance, Stand-pivot, RW Toilet Transfer Details (indicate cue type and reason): simulated through bed to recliner tranfsre Toileting- Clothing Manipulation and Hygiene: Maximal assistance, +2 for physical assistance, +2 for safety/equipment, Sit to/from stand Toileting - Clothing Manipulation Details (indicate cue type and reason): currently using folley catheter Functional mobility during ADLs: Moderate assistance, +2 for physical assistance, +2 for safety/equipment, Rolling walker, Cueing for sequencing General ADL Comments: pain limits access to LB for ADL and for transfers    Mobility  Overal bed mobility: Needs Assistance Bed Mobility: Supine to Sit Supine to sit: Mod  assist, +2 for physical assistance Sit to supine: Max assist, +2 for physical assistance General bed mobility comments: vc for step by step sequencing - going towards Right side for pain management, mod A to bring hips EOB utilizing slight bridging, heavy mod for trunk elevation, and use of the bed pad to bring hips EOB    Transfers  Overall transfer level: Needs assistance Equipment used: Rolling walker (2 wheeled) Transfers: Sit to/from Stand Sit to Stand:  Mod assist, +2 physical assistance Stand pivot transfers: Mod assist, +2 physical assistance, +2 safety/equipment General transfer comment: Heavy mod assist of 2 to power up to stand, and then mod assist to steady as pt moved hands form bed to RW    Ambulation / Gait / Stairs / Wheelchair Mobility  Ambulation/Gait Ambulation/Gait assistance: Mod assist, +2 safety/equipment Gait Distance (Feet): 10 Feet Assistive device: Rolling walker (2 wheeled) Gait Pattern/deviations: Step-to pattern General Gait Details: Light mod assist for steadying and RW progression; very good use of RW to unweigh painful LLE; tending to touchdown weight bear on LLE; very painful LLE    Posture / Balance Balance Overall balance assessment: Needs assistance Sitting-balance support: Bilateral upper extremity supported, Feet supported Sitting balance-Leahy Scale: Fair Standing balance support: Bilateral upper extremity supported Standing balance-Leahy Scale: Poor Standing balance comment: dependent on BUE on RW and external assist    Special needs/care consideration BiPAP/CPAP No CPM No Continuous Drip IV No Dialysis No     Life Vest No Oxygen No Special Bed No Trach Size No Wound Vac (area) No      Skin No                              Bowel mgmt: Last BM 08/15/18 Bladder mgmt: Voiding up on [facility] with assistance Diabetic mgmt No    Previous Home Environment Living Arrangements: Spouse/significant other Available Help at Discharge: Family, Available 24 hours/day Type of Home: House Home Layout: One level Home Access: Stairs to enter Entrance Stairs-Rails: Left Entrance Stairs-Number of Steps: 5 Bathroom Shower/Tub: Tub/shower unit, Door, Multimedia programmer: Handicapped height Bathroom Accessibility: Yes How Accessible: Accessible via walker [place]: No  Discharge Living Setting Plans for Discharge Living Setting: Patient's home, House, Lives with (comment)(Lives with  husband.) Type of Home at Discharge: House Discharge Home Layout: One level Discharge Home Access: Stairs to enter Entrance Stairs-Number of Steps: 5 steps Discharge Bathroom Shower/Tub: Tub/shower unit, Walk-in shower, Door, Curtain(Has 2 bathrooms, so has all units in home) Discharge Bathroom Toilet: Handicapped height Discharge Bathroom Accessibility: Yes How Accessible: Accessible via walker Does the patient have any problems obtaining your medications?: No  Social/Family/Support Systems Patient Roles: Spouse, Parent(Has a husband, 2 daughters and 1 son.) Contact Information: Engineer, manufacturing systems - husband Anticipated Caregiver: Husband Anticipated Ambulance person Information: CB - husband - [phone] Ability/Limitations of Caregiver: Husband is retired and can provide supervision at home. Caregiver Availability: 24/7 Discharge Plan Discussed with Primary Caregiver: Yes Is Caregiver In Agreement with Plan?: Yes Does Caregiver/Family have Issues with Lodging/Transportation while Pt is in Rehab?: No  Goals/Additional Needs Patient/Family Goal for Rehab: PT/OT mod I and supervision goals Expected length of stay: 7-10 days Cultural Considerations: None Dietary Needs: Regular diet, thin liquids Equipment Needs: TBD Additional Information: Patient is the aunt of an employee, Ileana Ladd, who works on the inpatient rehab unit Pt/Family Agrees to Admission and willing to participate: Yes Program Orientation Provided & Reviewed with  Pt/Caregiver Including Roles  & Responsibilities: Yes  Decrease burden of Care through IP rehab admission: N/A  Possible need for SNF placement upon discharge: Not anticipated  Patient Condition: This patient's medical and functional status has changed since the consult dated: 08/13/18 in which the Rehabilitation Physician determined and documented that the patient's condition is appropriate for intensive rehabilitative care in an inpatient rehabilitation  facility. See "History of Present Illness" (above) for medical update. Functional changes are:  Currently requiring mod assist +2 to ambulate 10 feet RW. Patient's medical and functional status update has been discussed with the Rehabilitation physician and patient remains appropriate for inpatient rehabilitation. Will admit to inpatient rehab today.  Preadmission Screen Completed By:  Retta Diones, 08/15/2018 12:20 PM ______________________________________________________________________   Discussed status with Dr. Naaman Plummer on 08/15/18 at 1219 and received telephone approval for admission today.  Admission Coordinator:  Retta Diones, time 1219/Date 08/15/18

## 2018-08-16 ENCOUNTER — Inpatient Hospital Stay (HOSPITAL_COMMUNITY): Payer: Medicare Other | Admitting: Occupational Therapy

## 2018-08-16 ENCOUNTER — Inpatient Hospital Stay (HOSPITAL_COMMUNITY): Payer: Medicare Other | Admitting: Physical Therapy

## 2018-08-16 MED ORDER — LISINOPRIL 5 MG PO TABS: 5.0000 mg | ORAL_TABLET | Freq: Every day | ORAL | 0 refills | Status: DC

## 2018-08-16 MED ORDER — CEFDINIR 300 MG PO CAPS: 300.0000 mg | ORAL_CAPSULE | Freq: Two times a day (BID) | ORAL | 0 refills | Status: AC

## 2018-08-16 MED ORDER — OXYCODONE HCL 5 MG PO TABS: 5.0000 mg | ORAL_TABLET | Freq: Three times a day (TID) | ORAL | 0 refills | Status: DC | PRN

## 2018-08-16 MED ORDER — ASPIRIN EC 81 MG PO TBEC: 81.0000 mg | DELAYED_RELEASE_TABLET | Freq: Every day | ORAL | 0 refills | Status: DC

## 2018-08-16 NOTE — Progress Notes (Signed)
Physical Therapy Treatment Patient Details Name: Mackenzie Madden MRN: [id number] DOB: [birth year] Today's Date: 08/16/2018    History of Present Illness Mackenzie Madden  is a 84 y.o. female, with past medical history significant for hypertension and hypothyroidism presents status post fall at home in the bathroom; Sustained Acute fracture of the left pubis extending into the adjacent medial rami, and acute fracture of the left sacral ala, stable fxs, WBAT per Ortho; possible bladder injury, Urology consult pending    PT Comments    Patient seen for mobility progression. Pt is making progress toward PT goals and tolerated gait training for 16 ft with RW and mod A. HEP handout and exercises reviewed with pt and pt's family. Pt declined stair training. Continue to progress as tolerated.    Follow Up Recommendations  CIR     Equipment Recommendations  Rolling walker with 5" wheels;3in1 (PT)    Recommendations for Other Services       Precautions / Restrictions Precautions Precautions: Fall Restrictions Weight Bearing Restrictions: Yes RLE Weight Bearing: Weight bearing as tolerated LLE Weight Bearing: Weight bearing as tolerated    Mobility  Bed Mobility               General bed mobility comments: pt OOB in chair upon arrival   Transfers Overall transfer level: Needs assistance Equipment used: Rolling walker (2 wheeled) Transfers: Sit to/from Stand Sit to Stand: Mod assist         General transfer comment: assist to power up into standing; cues for safe hand placement  Ambulation/Gait Ambulation/Gait assistance: Mod assist;+2 safety/equipment(chair follow) Gait Distance (Feet): 16 Feet Assistive device: Rolling walker (2 wheeled) Gait Pattern/deviations: Step-through pattern;Decreased stance time - left;Decreased step length - left;Decreased step length - right;Decreased dorsiflexion - left;Decreased weight shift to left;Antalgic;Trunk flexed Gait  velocity: decreased   General Gait Details: cues for posture, sequencing, use of bilat UE to offload painful L LE, and use of AD   Stairs Stairs: (pt declined stair training and requests PTAR home)           Wheelchair Mobility    Modified Rankin (Stroke Patients Only)       Balance Overall balance assessment: Needs assistance Sitting-balance support: Bilateral upper extremity supported;Feet supported Sitting balance-Leahy Scale: Fair     Standing balance support: Bilateral upper extremity supported Standing balance-Leahy Scale: Poor                              Cognition Arousal/Alertness: Awake/alert Behavior During Therapy: WFL for tasks assessed/performed Overall Cognitive Status: Within Functional Limits for tasks assessed                                        Exercises      General Comments General comments (skin integrity, edema, etc.): LE HEP handout given and exercises reviewed with pt and family; pt's husband and daughters present during session      Pertinent Vitals/Pain Pain Assessment: Faces Faces Pain Scale: Hurts little more Pain Location: L hip and R knee Pain Descriptors / Indicators: Grimacing;Sore;Guarding Pain Intervention(s): Limited activity within patient's tolerance;Monitored during session;Premedicated before session;Repositioned    Home Living                      Prior Function  PT Goals (current goals can now be found in the care plan section) Acute Rehab PT Goals Patient Stated Goal: get back to independent to plant my spring garden Progress towards PT goals: Progressing toward goals    Frequency    Min 5X/week      PT Plan Current plan remains appropriate    Co-evaluation              AM-PAC PT "6 Clicks" Daily Activity  Outcome Measure  Difficulty turning over in bed (including adjusting bedclothes, sheets and blankets)?: Unable Difficulty moving from  lying on back to sitting on the side of the bed? : Unable Difficulty sitting down on and standing up from a chair with arms (e.g., wheelchair, bedside commode, etc,.)?: Unable Help needed moving to and from a bed to chair (including a wheelchair)?: A Little Help needed walking in hospital room?: A Little Help needed climbing 3-5 steps with a railing? : A Lot 6 Click Score: 11    End of Session Equipment Utilized During Treatment: Gait belt Activity Tolerance: Patient tolerated treatment well Patient left: in chair;with call bell/phone within reach;with family/visitor present Nurse Communication: Mobility status PT Visit Diagnosis: Unsteadiness on feet (R26.81);Other abnormalities of gait and mobility (R26.89);Repeated falls (R29.6);History of falling (Z91.81);Pain Pain - Right/Left: Left Pain - part of body: (pelvis)     Time: 7915-0569 PT Time Calculation (min) (ACUTE ONLY): 25 min  Charges:  $Gait Training: 8-22 mins $Therapeutic Activity: 8-22 mins                     Earney Navy, PTA Acute Rehabilitation Services Pager: [phone] Office: [phone]     Darliss Cheney 08/16/2018, 4:12 PM

## 2018-08-16 NOTE — Plan of Care (Signed)
  Problem: Education: Goal: Knowledge of General Education information will improve Description Including pain rating scale, medication(s)/side effects and non-pharmacologic comfort measures Outcome: Progressing   Problem: Health Behavior/Discharge Planning: Goal: Ability to manage health-related needs will improve Outcome: Progressing   Problem: Clinical Measurements: Goal: Ability to maintain clinical measurements within normal limits will improve Outcome: Progressing Goal: Will remain free from infection Outcome: Progressing   Problem: Activity: Goal: Risk for activity intolerance will decrease Outcome: Progressing   Problem: Nutrition: Goal: Adequate nutrition will be maintained Outcome: Progressing   Problem: Coping: Goal: Level of anxiety will decrease Outcome: Progressing   Problem: Elimination: Goal: Will not experience complications related to bowel motility Outcome: Progressing Goal: Will not experience complications related to urinary retention Outcome: Progressing   Problem: Safety: Goal: Ability to remain free from injury will improve Outcome: Progressing Note:  Pt remains free of injury. Bed locked and in lowest position. Call bell within reach. Daughter at bedside.

## 2018-08-16 NOTE — Progress Notes (Signed)
Sdhe change her mind and wanted to go home with home health care.  She did not go to inpatient rehab.She did  not go home yesterday as her home health set up was not arranged. I discussed with Dr. Ninfa Linden orthopedic today over the phone, since her hip fractures are stable and he does not recommend any chemical prophylaxis.Patient is on aspirin 81 mg which she can continue.  I instructed on need for frequent mobilization. She is being discharged home today as her home health is ready .

## 2018-08-16 NOTE — Plan of Care (Signed)
  Problem: Education: Goal: Knowledge of General Education information will improve Description: Including pain rating scale, medication(s)/side effects and non-pharmacologic comfort measures Outcome: Progressing   Problem: Activity: Goal: Risk for activity intolerance will decrease Outcome: Progressing   Problem: Elimination: Goal: Will not experience complications related to urinary retention Outcome: Progressing   Problem: Safety: Goal: Ability to remain free from injury will improve Outcome: Progressing   Problem: Skin Integrity: Goal: Risk for impaired skin integrity will decrease Outcome: Progressing   

## 2018-08-16 NOTE — Progress Notes (Signed)
Patient discharging home. Discharge instructions explained to patient and daughter and they both verbalize understanding. Took all personal belongings. No further questions or concerns voiced. Patient left via PTAR.

## 2018-10-27 ENCOUNTER — Emergency Department (HOSPITAL_COMMUNITY): Payer: Medicare Other

## 2018-10-27 ENCOUNTER — Emergency Department (HOSPITAL_COMMUNITY)
Admission: EM | Admit: 2018-10-27 | Discharge: 2018-10-27 | Disposition: A | Discharge status: Home / Self Care | Payer: Medicare Other | Attending: Emergency Medicine | Admitting: Emergency Medicine

## 2018-10-27 ENCOUNTER — Encounter (HOSPITAL_COMMUNITY): Payer: Self-pay | Admitting: Emergency Medicine

## 2018-10-27 ENCOUNTER — Other Ambulatory Visit: Payer: Self-pay

## 2018-10-27 DIAGNOSIS — Y999 Unspecified external cause status: Secondary | ICD-10-CM | POA: Insufficient documentation

## 2018-10-27 DIAGNOSIS — N3000 Acute cystitis without hematuria: Secondary | ICD-10-CM | POA: Diagnosis not present

## 2018-10-27 DIAGNOSIS — W1830XA Fall on same level, unspecified, initial encounter: Secondary | ICD-10-CM | POA: Insufficient documentation

## 2018-10-27 DIAGNOSIS — Y93G1 Activity, food preparation and clean up: Secondary | ICD-10-CM | POA: Diagnosis not present

## 2018-10-27 DIAGNOSIS — R51 Headache: Secondary | ICD-10-CM | POA: Insufficient documentation

## 2018-10-27 DIAGNOSIS — S32020A Wedge compression fracture of second lumbar vertebra, initial encounter for closed fracture: Secondary | ICD-10-CM | POA: Diagnosis not present

## 2018-10-27 DIAGNOSIS — W19XXXA Unspecified fall, initial encounter: Secondary | ICD-10-CM

## 2018-10-27 DIAGNOSIS — Y92010 Kitchen of single-family (private) house as the place of occurrence of the external cause: Secondary | ICD-10-CM | POA: Diagnosis not present

## 2018-10-27 DIAGNOSIS — M545 Low back pain: Secondary | ICD-10-CM | POA: Diagnosis present

## 2018-10-27 LAB — URINALYSIS, ROUTINE W REFLEX MICROSCOPIC
BILIRUBIN URINE: NEGATIVE
Bacteria, UA: NONE SEEN
Glucose, UA: NEGATIVE mg/dL
Ketones, ur: NEGATIVE mg/dL
NITRITE: NEGATIVE
PH: 8 (ref 5.0–8.0)
PROTEIN: NEGATIVE mg/dL
Specific Gravity, Urine: 1.005 (ref 1.005–1.030)

## 2018-10-27 MED ORDER — CEPHALEXIN 500 MG PO CAPS: 500.0000 mg | ORAL_CAPSULE | Freq: Two times a day (BID) | ORAL | 0 refills | Status: AC

## 2018-10-27 MED ORDER — METOPROLOL TARTRATE 25 MG PO TABS
25.0000 mg | ORAL_TABLET | Freq: Once | ORAL | Status: AC
  Administered 2018-10-27: 25 mg via ORAL
  Filled 2018-10-27: qty 1

## 2018-10-27 MED ORDER — LISINOPRIL 10 MG PO TABS
5.0000 mg | ORAL_TABLET | Freq: Once | ORAL | Status: AC
  Administered 2018-10-27: 5 mg via ORAL
  Filled 2018-10-27: qty 1

## 2018-10-27 NOTE — ED Triage Notes (Addendum)
Pt from home with husband with c/o fall. Pt states she lost her balance and fell backwards hitting a carpeted floor.  Pt denies LOC and any dizziness prior to fall. Back pain 7/10. Pt able to ambulate after fall.

## 2018-10-27 NOTE — ED Notes (Signed)
Patient transported to CT 

## 2018-10-27 NOTE — ED Notes (Signed)
Pt able to ambulate on scene after fall.

## 2018-10-27 NOTE — ED Provider Notes (Signed)
[facility] EMERGENCY DEPARTMENT Provider Note   CSN: [id number] Arrival date & time: 10/27/18  0848     History   Chief Complaint No chief complaint on file.   HPI Mackenzie Madden is a 85 y.o. female.  HPI   Presents with concern for fall yesterday last night after dinner Cleaning up the kitchen table, holding dishes then turned, slipped and fell. Fell on buttocks and hit head. No syncope, LOC, chest pain, dyspnea or other concerns. Since fall, has had some nausea. Was able to ambulate but has had lumbar back pain and left hip pain. Last night took aleve with minimal relief. Reports urinary frequency beginning last night, was up multiple times urinating. No other difficulty urinating, no loss of control of bowel or bladder. No fevers. Declines pain medications.  Has not taken blood pressure medications today.  Past Medical History:  Diagnosis Date  . Hypertension   . Thyroid disease     Patient Active Problem List   Diagnosis Date Noted  . Pubic ramus fracture, left, closed, initial encounter ([place])   . Leukocytosis   . Pubic ramus fracture ([place]) 08/11/2018  . Sacral fracture, closed ([place])   . Fall   . Acute lower UTI   . Laceration of bladder   . Abnormal transaminases 06/06/2016  . Atypical chest pain 06/04/2016  . Chest pain 06/04/2016  . Faintness   . Chronic diastolic heart failure ([place])   . Intraocular pressure increase   . Syncope 02/21/2015  . HTN (hypertension) 02/21/2015  . Hypothyroid 02/21/2015    Past Surgical History:  Procedure Laterality Date  . SPLENECTOMY, TOTAL       OB History   No obstetric history on file.      Home Medications    Prior to Admission medications   Medication Sig Start Date End Date Taking? Authorizing Provider  aspirin EC 81 MG tablet Take 1 tablet (81 mg total) by mouth daily. 08/16/18  Yes Kc, Maren [place], MD  brimonidine-timolol (COMBIGAN) 0.2-0.5 % ophthalmic solution Place 1 drop into the  left eye 3 (three) times daily.    Yes [provider]  Calcium Carb-Cholecalciferol (CALCIUM 600 + D PO) Take 1,200 mg by mouth at bedtime.   Yes [provider]  latanoprost (XALATAN) 0.005 % ophthalmic solution Place 1 drop into both eyes at bedtime. 01/11/15  Yes [provider]  levothyroxine (SYNTHROID, LEVOTHROID) 100 MCG tablet Take 100 mcg by mouth daily before breakfast.   Yes [provider]  lisinopril (PRINIVIL,ZESTRIL) 5 MG tablet Take 1 tablet (5 mg total) by mouth daily. 08/16/18 10/27/18 Yes Antonieta Pert, MD  Multiple Vitamin (MULTIVITAMIN WITH MINERALS) TABS tablet Take 1 tablet by mouth daily.   Yes [provider]  naproxen sodium (ANAPROX) 220 MG tablet Take 220 mg by mouth daily as needed (pain). ALEVE   Yes [provider]  cephALEXin (KEFLEX) 500 MG capsule Take 1 capsule (500 mg total) by mouth 2 (two) times daily for 7 days. 10/27/18 11/03/18  Gareth Morgan, MD  metoprolol tartrate (LOPRESSOR) 25 MG tablet Take 1 tablet (25 mg total) by mouth 2 (two) times daily. Patient not taking: Reported on 08/11/2018 06/07/16   Florencia Reasons, MD  oxyCODONE (OXY IR/ROXICODONE) 5 MG immediate release tablet Take 1 tablet (5 mg total) by mouth every 8 (eight) hours as needed for up to 10 doses for severe pain. Patient not taking: Reported on 10/27/2018 08/16/18   Antonieta Pert, MD  senna-docusate (SENOKOT-S)  8.6-50 MG tablet Take 1 tablet by mouth at bedtime. Patient not taking: Reported on 08/11/2018 06/07/16   Florencia Reasons, MD    Family History Family History  Problem Relation Age of Onset  . CAD Mother     Social History Social History   Tobacco Use  . Smoking status: Never Smoker  . Smokeless tobacco: Never Used  Substance Use Topics  . Alcohol use: No  . Drug use: No     Allergies   Patient has no known allergies.   Review of Systems Review of Systems  Constitutional: Negative for fever.  HENT: Negative for sore throat.     Eyes: Negative for visual disturbance.  Respiratory: Negative for cough and shortness of breath.   Cardiovascular: Negative for chest pain.  Gastrointestinal: Positive for nausea. Negative for abdominal pain.  Genitourinary: Negative for difficulty urinating.  Musculoskeletal: Positive for arthralgias and back pain. Negative for neck pain.  Skin: Negative for rash.  Neurological: Negative for syncope and headaches.     Physical Exam Updated Vital Signs BP (!) 181/106 (BP Location: Right Arm)   Pulse 80   Temp 98.1 F (36.7 C) (Oral)   Resp 18   SpO2 96%   Physical Exam Vitals signs and nursing note reviewed.  Constitutional:      General: She is not in acute distress.    Appearance: She is well-developed. She is not diaphoretic.  HENT:     Head: Normocephalic and atraumatic.  Eyes:     Conjunctiva/sclera: Conjunctivae normal.  Neck:     Musculoskeletal: Normal range of motion.  Cardiovascular:     Rate and Rhythm: Normal rate and regular rhythm.     Heart sounds: Normal heart sounds. No murmur. No friction rub. No gallop.   Pulmonary:     Effort: Pulmonary effort is normal. No respiratory distress.     Breath sounds: Normal breath sounds. No wheezing or rales.  Abdominal:     General: There is no distension.     Palpations: Abdomen is soft.     Tenderness: There is no abdominal tenderness. There is no guarding.  Musculoskeletal:     Left hip: She exhibits tenderness and bony tenderness. She exhibits normal range of motion and normal strength.     Lumbar back: She exhibits tenderness and bony tenderness.  Skin:    General: Skin is warm and dry.     Findings: No erythema or rash.  Neurological:     Mental Status: She is alert and oriented to person, place, and time.     GCS: GCS eye subscore is 4. GCS verbal subscore is 5. GCS motor subscore is 6.     Sensory: Sensation is intact.     Motor: Motor function is intact.     Comments: 5/5 strength of LE hip  flexion/extension, knee flexion/extension, dorsi/plantar flexion      ED Treatments / Results  Labs (all labs ordered are listed, but only abnormal results are displayed) Labs Reviewed  URINALYSIS, ROUTINE W REFLEX MICROSCOPIC - Abnormal; Notable for the following components:      Result Value   Color, Urine STRAW (*)    Hgb urine dipstick SMALL (*)    Leukocytes, UA SMALL (*)    All other components within normal limits  URINE CULTURE    EKG EKG Interpretation  Date/Time:  Sunday October 27 2018 08:57:12 EST Ventricular Rate:  87 PR Interval:    QRS Duration: 153 QT Interval:  378 QTC Calculation:  455 R Axis:   47 Text Interpretation:  Sinus rhythm with sinus arrhythmia Left bundle branch block Baseline wander in lead(s) V1 Confirmed by Gareth Morgan [phone]) on 10/27/2018 9:47:34 AM   Radiology Dg Lumbar Spine Complete  Result Date: 10/27/2018 CLINICAL DATA:  Status post fall. EXAM: LUMBAR SPINE - COMPLETE 4+ VIEW COMPARISON:  06/06/2016. FINDINGS: Curvature of the lumbar spine is convex towards the right. There is been interval mild loss of superior endplate height at the L2 vertebra when compared with 01/23/2008. Asymmetric increased sclerosis within the left sacral wing compatible with chronic insufficiency fracture. No fracture or subluxation identified. Aortic atherosclerosis. IMPRESSION: 1. Mild loss of superior endplate height at the L2 vertebra compatible with age-indeterminate fracture. 2. Chronic left sacral wing insufficiency fracture. 3.  Aortic Atherosclerosis (ICD10-I70.0). Electronically Signed   By: Kerby Moors M.D.   On: 10/27/2018 10:29   Ct Head Wo Contrast  Result Date: 10/27/2018 CLINICAL DATA:  [place] and hit the back of her head. Headache. EXAM: CT HEAD WITHOUT CONTRAST TECHNIQUE: Contiguous axial images were obtained from the base of the skull through the vertex without intravenous contrast. COMPARISON:  Brain MR dated 07/29/2016. FINDINGS: Brain:  Mild-to-moderate enlargement of the ventricles and subarachnoid spaces. Moderate patchy white matter low density in both cerebral hemispheres. No intracranial hemorrhage, mass lesion or CT evidence of acute infarction. Vascular: No hyperdense vessel or unexpected calcification. Skull: Normal. Negative for fracture or focal lesion. Sinuses/Orbits: Status post bilateral cataract extraction. Unremarkable paranasal sinuses. Other: None. IMPRESSION: 1. No acute abnormality. 2. Mild to moderate diffuse cerebral and cerebellar atrophy. 3. Moderate chronic small vessel white matter ischemic changes in both cerebral hemispheres. Electronically Signed   By: Claudie Revering M.D.   On: 10/27/2018 10:49   Dg Hip Unilat W Or Wo Pelvis 2-3 Views Left  Result Date: 10/27/2018 CLINICAL DATA:  Low back and left hip pain following a fall. EXAM: DG HIP (WITH OR WITHOUT PELVIS) 2-3V LEFT COMPARISON:  Pelvis dated 08/11/2018. FINDINGS: Interval partial healing of the previously demonstrated left pubic fracture. No acute fracture or dislocation. Diffuse osteopenia. Mild to moderate bilateral hip degenerative changes. IMPRESSION: 1. No acute fracture or dislocation. 2. Mild to moderate bilateral hip degenerative changes. 3. Interval partial healing of the previously demonstrated left pubic fracture. Electronically Signed   By: Claudie Revering M.D.   On: 10/27/2018 10:25    Procedures Procedures (including critical care time)  Medications Ordered in ED Medications  lisinopril (PRINIVIL,ZESTRIL) tablet 5 mg (5 mg Oral Given 10/27/18 1116)  metoprolol tartrate (LOPRESSOR) tablet 25 mg (25 mg Oral Given 10/27/18 1117)     Initial Impression / Assessment and Plan / ED Course  I have reviewed the triage vital signs and the nursing notes.  Pertinent labs & imaging results that were available during my care of the patient were reviewed by me and considered in my medical decision making (see chart for details).     85yo female  presents with concern for low back pain and pelvic pain after a mechanical fall last night.  XR of lumbar spine and left hip show mild superior endplate height loss at L2 consistent with age-indeterminate fracture, chronic left sacral wing insufficiency, partial healing of previous pubic fracture.  She is not having significant pain, no neurologic symptoms, and feel supportive care and outpatient follow up appropriate for L2 mild compression fx.  She is ambulatory and doubt occult fracture at this time and suspect pain most likely secondary to contusion. Pt  declines pain medications.   Head CT completed shows no acute abnormalities.    Urinalysis with WBC and given urinary frequency, suspect UTI.  Given rx for keflex. Stable for outpt follow up. Patient discharged in stable condition with understanding of reasons to return.   Final Clinical Impressions(s) / ED Diagnoses   Final diagnoses:  Fall, initial encounter  Closed compression fracture of L2 lumbar vertebra, initial encounter ([place])  Acute cystitis without hematuria    ED Discharge Orders         Ordered    cephALEXin (KEFLEX) 500 MG capsule  2 times daily     10/27/18 1120           Gareth Morgan, MD 10/27/18 1153

## 2018-10-27 NOTE — ED Notes (Signed)
Pt and family voice understanding of discharge instructions and follow up information. NAD on departure.

## 2018-10-27 NOTE — ED Notes (Signed)
Discharge instructions (including medications) discussed with and copy provided to patient/caregiver.  Pt and family verbalizes understanding of d/c instructions.

## 2018-10-27 NOTE — ED Notes (Signed)
Patient transported to X-ray 

## 2018-10-27 NOTE — ED Notes (Signed)
Pt unable to sign due to lack of signature pad.

## 2018-10-29 LAB — URINE CULTURE

## 2018-10-30 ENCOUNTER — Telehealth: Payer: Self-pay | Admitting: *Deleted

## 2018-10-30 NOTE — Telephone Encounter (Signed)
Post ED Visit - Positive Culture Follow-up  Culture report reviewed by antimicrobial stewardship pharmacist:  []  Elenor Quinones, Pharm.D. []  Heide Guile, Pharm.D., BCPS AQ-ID []  Parks Neptune, Pharm.D., BCPS []  Alycia Rossetti, Pharm.D., BCPS []  [place], Pharm.D., BCPS, AAHIVP []  Legrand Como, Pharm.D., BCPS, AAHIVP [x]  Salome Arnt, PharmD, BCPS []  Johnnette Gourd, PharmD, BCPS []  Hughes Better, PharmD, BCPS []  Leeroy Cha, PharmD  Positive urine culture Treated with Cephalexin, organism sensitive to the same and no further patient follow-up is required at this time.  [facility] 10/30/2018, 10:53 AM

## 2018-11-11 ENCOUNTER — Encounter (HOSPITAL_COMMUNITY): Payer: Self-pay

## 2018-11-11 ENCOUNTER — Emergency Department (HOSPITAL_COMMUNITY): Payer: Medicare Other

## 2018-11-11 ENCOUNTER — Emergency Department (HOSPITAL_COMMUNITY)
Admission: EM | Admit: 2018-11-11 | Discharge: 2018-11-11 | Disposition: A | Discharge status: Home / Self Care | Payer: Medicare Other | Attending: Emergency Medicine | Admitting: Emergency Medicine

## 2018-11-11 DIAGNOSIS — Z79899 Other long term (current) drug therapy: Secondary | ICD-10-CM | POA: Insufficient documentation

## 2018-11-11 DIAGNOSIS — I5032 Chronic diastolic (congestive) heart failure: Secondary | ICD-10-CM | POA: Insufficient documentation

## 2018-11-11 DIAGNOSIS — I110 Hypertensive heart disease with heart failure: Secondary | ICD-10-CM | POA: Insufficient documentation

## 2018-11-11 DIAGNOSIS — Z7982 Long term (current) use of aspirin: Secondary | ICD-10-CM | POA: Diagnosis not present

## 2018-11-11 DIAGNOSIS — E039 Hypothyroidism, unspecified: Secondary | ICD-10-CM | POA: Diagnosis not present

## 2018-11-11 DIAGNOSIS — R1310 Dysphagia, unspecified: Secondary | ICD-10-CM | POA: Diagnosis present

## 2018-11-11 LAB — CBC WITH DIFFERENTIAL/PLATELET
Abs Immature Granulocytes: 0.02 10*3/uL (ref 0.00–0.07)
BASOS PCT: 1 %
Basophils Absolute: 0.1 10*3/uL (ref 0.0–0.1)
Eosinophils Absolute: 0.2 10*3/uL (ref 0.0–0.5)
Eosinophils Relative: 2 %
HCT: 42.4 % (ref 36.0–46.0)
HEMOGLOBIN: 14.1 g/dL (ref 12.0–15.0)
IMMATURE GRANULOCYTES: 0 %
LYMPHS ABS: 2.3 10*3/uL (ref 0.7–4.0)
LYMPHS PCT: 25 %
MCH: 32 pg (ref 26.0–34.0)
MCHC: 33.3 g/dL (ref 30.0–36.0)
MCV: 96.4 fL (ref 80.0–100.0)
MONOS PCT: 14 %
Monocytes Absolute: 1.2 10*3/uL — ABNORMAL HIGH (ref 0.1–1.0)
NEUTROS ABS: 5.4 10*3/uL (ref 1.7–7.7)
NEUTROS PCT: 58 %
PLATELETS: 218 10*3/uL (ref 150–400)
RBC: 4.4 MIL/uL (ref 3.87–5.11)
RDW: 13.8 % (ref 11.5–15.5)
WBC: 9.2 10*3/uL (ref 4.0–10.5)
nRBC: 0 % (ref 0.0–0.2)

## 2018-11-11 LAB — BASIC METABOLIC PANEL
Anion gap: 9 (ref 5–15)
BUN: 14 mg/dL (ref 8–23)
CALCIUM: 9.2 mg/dL (ref 8.9–10.3)
CHLORIDE: 100 mmol/L (ref 98–111)
CO2: 25 mmol/L (ref 22–32)
CREATININE: 0.84 mg/dL (ref 0.44–1.00)
GFR calc Af Amer: >60 mL/min (ref >60)
Glucose, Bld: 119 mg/dL — ABNORMAL HIGH (ref 70–99)
POTASSIUM: 4.3 mmol/L (ref 3.5–5.1)
Sodium: 134 mmol/L — ABNORMAL LOW (ref 135–145)

## 2018-11-11 MED ORDER — AMOXICILLIN-POT CLAVULANATE 600-42.9 MG/5ML PO SUSR: 600.0000 mg | Freq: Two times a day (BID) | ORAL | 0 refills | Status: AC

## 2018-11-11 NOTE — ED Notes (Signed)
Patient transported to X-ray 

## 2018-11-11 NOTE — ED Notes (Signed)
ED Provider at bedside. 

## 2018-11-11 NOTE — ED Triage Notes (Signed)
Patient having increased difficulty with swallowing. States that today she has had liquid coming back up and unable to get any of her lunch down. Patient in no acute distress and no neuro deficits

## 2018-11-11 NOTE — ED Provider Notes (Signed)
[facility] EMERGENCY DEPARTMENT Provider Note   CSN: [id number] Arrival date & time: 11/11/18  1407     History   Chief Complaint Chief Complaint  Patient presents with  . Illness    HPI Mackenzie Madden is a 85 y.o. female.  The history is provided by the patient.  Illness  Location:  General Severity:  Mild Onset quality:  Gradual Duration:  3 weeks Timing:  Constant Progression:  Worsening Chronicity:  New Context:  Patient with difficulty swallowing over the last several weeks.  First with solids and not gotten slightly worse with liquids.  Started some new antibiotics the past 2 weeks. Relieved by:  Nothing Worsened by:  Nothing Associated symptoms: no abdominal pain, no chest pain, no congestion, no cough, no diarrhea, no ear pain, no fever, no myalgias, no nausea, no rash, no shortness of breath, no sore throat, no vomiting and no wheezing     Past Medical History:  Diagnosis Date  . Hypertension   . Thyroid disease     Patient Active Problem List   Diagnosis Date Noted  . Pubic ramus fracture, left, closed, initial encounter ([place])   . Leukocytosis   . Pubic ramus fracture ([place]) 08/11/2018  . Sacral fracture, closed ([place])   . Fall   . Acute lower UTI   . Laceration of bladder   . Abnormal transaminases 06/06/2016  . Atypical chest pain 06/04/2016  . Chest pain 06/04/2016  . Faintness   . Chronic diastolic heart failure ([place])   . Intraocular pressure increase   . Syncope 02/21/2015  . HTN (hypertension) 02/21/2015  . Hypothyroid 02/21/2015    Past Surgical History:  Procedure Laterality Date  . SPLENECTOMY, TOTAL       OB History   No obstetric history on file.      Home Medications    Prior to Admission medications   Medication Sig Start Date End Date Taking? Authorizing Provider  amoxicillin-clavulanate (AUGMENTIN ES-600) 600-42.9 MG/5ML suspension Take 5 mLs (600 mg total) by mouth 2 (two) times daily for 5  days. 11/11/18 11/16/18  Kierstan Auer, DO  aspirin EC 81 MG tablet Take 1 tablet (81 mg total) by mouth daily. 08/16/18   Antonieta Pert, MD  brimonidine-timolol (COMBIGAN) 0.2-0.5 % ophthalmic solution Place 1 drop into the left eye 3 (three) times daily.     [provider]  Calcium Carb-Cholecalciferol (CALCIUM 600 + D PO) Take 1,200 mg by mouth at bedtime.    [provider]  latanoprost (XALATAN) 0.005 % ophthalmic solution Place 1 drop into both eyes at bedtime. 01/11/15   [provider]  levothyroxine (SYNTHROID, LEVOTHROID) 100 MCG tablet Take 100 mcg by mouth daily before breakfast.    [provider]  lisinopril (PRINIVIL,ZESTRIL) 5 MG tablet Take 1 tablet (5 mg total) by mouth daily. 08/16/18 10/27/18  Antonieta Pert, MD  metoprolol tartrate (LOPRESSOR) 25 MG tablet Take 1 tablet (25 mg total) by mouth 2 (two) times daily. Patient not taking: Reported on 08/11/2018 06/07/16   Florencia Reasons, MD  Multiple Vitamin (MULTIVITAMIN WITH MINERALS) TABS tablet Take 1 tablet by mouth daily.    [provider]  naproxen sodium (ANAPROX) 220 MG tablet Take 220 mg by mouth daily as needed (pain). ALEVE    [provider]  oxyCODONE (OXY IR/ROXICODONE) 5 MG immediate release tablet Take 1 tablet (5 mg total) by mouth every 8 (eight) hours as needed for up to 10 doses for severe pain.  Patient not taking: Reported on 10/27/2018 08/16/18   Antonieta Pert, MD  senna-docusate (SENOKOT-S) 8.6-50 MG tablet Take 1 tablet by mouth at bedtime. Patient not taking: Reported on 08/11/2018 06/07/16   Florencia Reasons, MD    Family History Family History  Problem Relation Age of Onset  . CAD Mother     Social History Social History   Tobacco Use  . Smoking status: Never Smoker  . Smokeless tobacco: Never Used  Substance Use Topics  . Alcohol use: No  . Drug use: No     Allergies   Patient has no known allergies.   Review of Systems Review of Systems  Constitutional:  Negative for chills and fever.  HENT: Positive for trouble swallowing. Negative for congestion, dental problem, drooling, ear pain, facial swelling, hearing loss, nosebleeds, sore throat and voice change.   Eyes: Negative for pain and visual disturbance.  Respiratory: Negative for cough, shortness of breath and wheezing.   Cardiovascular: Negative for chest pain and palpitations.  Gastrointestinal: Negative for abdominal distention, abdominal pain, diarrhea, nausea, rectal pain and vomiting.  Genitourinary: Negative for difficulty urinating, dyspareunia, dysuria and hematuria.  Musculoskeletal: Negative for arthralgias, back pain and myalgias.  Skin: Negative for color change and rash.  Neurological: Negative for seizures and syncope.  All other systems reviewed and are negative.    Physical Exam Updated Vital Signs  ED Triage Vitals  Enc Vitals Group     BP 11/11/18 1413 (!) 176/90     Pulse Rate 11/11/18 1412 70     Resp 11/11/18 1413 18     Temp 11/11/18 1412 98.2 F (36.8 C)     Temp Source 11/11/18 1413 Oral     SpO2 11/11/18 1413 97 %     Weight --      Height --      Head Circumference --      Peak Flow --      Pain Score 11/11/18 1409 0     Pain Loc --      Pain Edu? --      Excl. in [place]? --      Physical Exam Vitals signs and nursing note reviewed.  Constitutional:      General: She is not in acute distress.    Appearance: She is well-developed. She is not ill-appearing.  HENT:     Head: Normocephalic and atraumatic.     Nose: Nose normal.     Mouth/Throat:     Mouth: Mucous membranes are moist.     Pharynx: No posterior oropharyngeal erythema.  Eyes:     Conjunctiva/sclera: Conjunctivae normal.     Pupils: Pupils are equal, round, and reactive to light.  Neck:     Musculoskeletal: Normal range of motion and neck supple.  Cardiovascular:     Rate and Rhythm: Normal rate and regular rhythm.     Pulses: Normal pulses.     Heart sounds: Normal heart  sounds. No murmur.  Pulmonary:     Effort: Pulmonary effort is normal. No respiratory distress.     Breath sounds: Normal breath sounds.  Abdominal:     General: There is no distension.     Palpations: Abdomen is soft.     Tenderness: There is no abdominal tenderness.  Musculoskeletal: Normal range of motion.     Right lower leg: No edema.     Left lower leg: No edema.  Skin:    General: Skin is warm and dry.  Capillary Refill: Capillary refill takes less than 2 seconds.  Neurological:     General: No focal deficit present.     Mental Status: She is alert and oriented to person, place, and time.     Cranial Nerves: No cranial nerve deficit.     Sensory: No sensory deficit.     Motor: No weakness.     Coordination: Coordination normal.     Gait: Gait normal.  Psychiatric:        Mood and Affect: Mood normal.      ED Treatments / Results  Labs (all labs ordered are listed, but only abnormal results are displayed) Labs Reviewed  CBC WITH DIFFERENTIAL/PLATELET - Abnormal; Notable for the following components:      Result Value   Monocytes Absolute 1.2 (*)    All other components within normal limits  BASIC METABOLIC PANEL - Abnormal; Notable for the following components:   Sodium 134 (*)    Glucose, Bld 119 (*)    All other components within normal limits    EKG None  Radiology Dg Neck Soft Tissue  Result Date: 11/11/2018 CLINICAL DATA:  Trouble swallowing EXAM: NECK SOFT TISSUES - 1+ VIEW COMPARISON:  None. FINDINGS: Normal prevertebral soft tissue thickness. No radiopaque foreign body. Normal appearance of epiglottis and subglottic trachea. Carotid vascular calcifications. Degenerative changes of the cervical spine. IMPRESSION: Negative. Electronically Signed   By: Donavan Foil M.D.   On: 11/11/2018 17:14   Dg Chest 2 View  Result Date: 11/11/2018 CLINICAL DATA:  Trouble swallowing EXAM: CHEST - 2 VIEW COMPARISON:  01/26/2017, 01/10/2017 FINDINGS: Trace pleural  effusion. Mild scarring at the bases. Mild cardiomegaly. No acute consolidation. Aortic atherosclerosis. IMPRESSION: 1. Trace pleural effusions. 2. Mild cardiomegaly. Electronically Signed   By: Donavan Foil M.D.   On: 11/11/2018 17:14    Procedures Procedures (including critical care time)  Medications Ordered in ED Medications - No data to display   Initial Impression / Assessment and Plan / ED Course  I have reviewed the triage vital signs and the nursing notes.  Pertinent labs & imaging results that were available during my care of the patient were reviewed by me and considered in my medical decision making (see chart for details).     Mackenzie Madden is an 85 year old female with history of heart failure, hypertension who presents to the ED with difficulty swallowing.  Patient with normal vitals.  No fever.  Symptoms have been ongoing for the last several weeks.  Appeared to have started after some antibiotic use.  States that she is able to tolerate liquids until having some difficulty today with it.  She states that solids appear to get stuck around her mid chest.  Denies any food bolus symptoms.  Denies any chest pain, shortness of breath.  No signs of throat infection on exam.  No sore throat.  Patient is overall asymptomatic.  She had some issues with swallowing her antibiotic this morning.  However has been able to tolerate liquids since.  No other concerning stroke symptoms.  Patient has normal neurological exam.  She is able to tolerate p.o. fluids during my care.  Lab work showed no significant anemia, electrolyte abnormality, kidney injury.  Patient does not appear to be clinically dehydrated.  Has never had an EGD or difficulty with swallowing in the past.  Suspect possible pill esophagitis, anatomical or functional swallowing issue.  Patient had chest x-ray and soft tissue of the neck x-ray that looked overall unremarkable.  We will switch her amoxicillin prescription over to  oral suspension form.  Given information to follow-up with GI.  Recommend follow-up with primary care doctor.  Given return precautions. No concern for stroke at this time or food bolus.  This chart was dictated using voice recognition software.  Despite best efforts to proofread,  errors can occur which can change the documentation meaning.    Final Clinical Impressions(s) / ED Diagnoses   Final diagnoses:  Dysphagia, unspecified type    ED Discharge Orders         Ordered    amoxicillin-clavulanate (AUGMENTIN ES-600) 600-42.9 MG/5ML suspension  2 times daily     11/11/18 1729           Lennice Sites, DO 11/11/18 1733

## 2018-11-11 NOTE — ED Notes (Signed)
Patient back in room. No distress observed.

## 2018-11-11 NOTE — ED Notes (Signed)
Patient took sip of  Soda without difficulty

## 2018-11-12 ENCOUNTER — Telehealth: Payer: Self-pay

## 2018-11-12 NOTE — Telephone Encounter (Signed)
OK 

## 2018-11-12 NOTE — Telephone Encounter (Signed)
Caryl Pina (grand daughter) contacted [place] this AM. They were told at the ED yesterday to f/u with GI in 2 days. Please advise if I may add her on for the 13th.  Thank you Sir.

## 2018-11-12 NOTE — Telephone Encounter (Signed)
Added on for 11/14/2018.

## 2018-11-14 ENCOUNTER — Ambulatory Visit (INDEPENDENT_AMBULATORY_CARE_PROVIDER_SITE_OTHER): Payer: Medicare Other | Admitting: Internal Medicine

## 2018-11-14 ENCOUNTER — Encounter: Payer: Self-pay | Admitting: Internal Medicine

## 2018-11-14 VITALS — BP 114/60 | HR 68 | Ht 59.5 in | Wt 101.1 lb

## 2018-11-14 DIAGNOSIS — R1310 Dysphagia, unspecified: Secondary | ICD-10-CM | POA: Diagnosis not present

## 2018-11-14 DIAGNOSIS — R1319 Other dysphagia: Secondary | ICD-10-CM

## 2018-11-14 NOTE — Patient Instructions (Signed)
  You have been scheduled for an endoscopy. Please follow written instructions given to you at your visit today. If you use inhalers (even only as needed), please bring them with you on the day of your procedure.   I appreciate the opportunity to care for you. Carl Gessner, MD, FACG 

## 2018-11-14 NOTE — Progress Notes (Signed)
Mackenzie Madden 85 y.o. [birth year] [id number]  Assessment & Plan:   Encounter Diagnosis  Name Primary?  . Esophageal dysphagia Yes    Cause of her problem is not clear.  Peptic stricture, malignancy, paraesophageal hernia, into mind as possibilities.  Esophageal dysmotility or other motility disorder possible.  It does not appear that any of her medications are causing this.  EGD with possible esophageal dilation is indicated.  I have explained the risks benefits and indications and she agrees to proceed and we will do this tomorrow at 4:00.  I appreciate the opportunity to care for this patient. CC: Lavone Orn, MD    Subjective:   Chief Complaint: Dysphagia  HPI The patient is a very nice 85 year old white woman with a few months of dysphagia that is worsening.  Earlier this week she felt like she could not swallow much at all she went to the ER and was evaluated and was released to follow-up.  Neck film okay, chest x-ray with trace pleural effusions and mild cardiomegaly.  She thinks she is losing weight though her daughter said that that was over a long period of time and 1 can see the last 3 weights below.  She is never had these problems before she is not having particular pain.  Pills can be a problem and some of the pills are large.  When she gets what sounds like a food impaction she will have regurgitation of saliva.  She has had some mild abdominal pain at times.  GI review of systems is otherwise negative. Wt Readings from Last 3 Encounters:  11/14/18 101 lb 2 oz (45.9 kg)  08/11/18 98 lb (44.5 kg)  06/07/16 108 lb 8 oz (49.2 kg)     No Known Allergies Current Meds  Medication Sig  . amoxicillin-clavulanate (AUGMENTIN ES-600) 600-42.9 MG/5ML suspension Take 5 mLs (600 mg total) by mouth 2 (two) times daily for 5 days.  . brimonidine-timolol (COMBIGAN) 0.2-0.5 % ophthalmic solution Place 1 drop into the left eye 3 (three) times daily.   . Calcium  Carb-Cholecalciferol (CALCIUM 600 + D PO) Take 1,200 mg by mouth at bedtime.  Marland Kitchen latanoprost (XALATAN) 0.005 % ophthalmic solution Place 1 drop into both eyes at bedtime.  Marland Kitchen levothyroxine (SYNTHROID, LEVOTHROID) 100 MCG tablet Take 100 mcg by mouth daily before breakfast.  . Multiple Vitamin (MULTIVITAMIN WITH MINERALS) TABS tablet Take 1 tablet by mouth daily.  . naproxen sodium (ANAPROX) 220 MG tablet Take 220 mg by mouth daily as needed (pain). ALEVE  . senna-docusate (SENOKOT-S) 8.6-50 MG tablet Take 1 tablet by mouth at bedtime. (Patient taking differently: Take 1 tablet by mouth as needed. )   Past Medical History:  Diagnosis Date  . Glaucoma   . Hypertension   . Thyroid disease    Past Surgical History:  Procedure Laterality Date  . ABDOMINAL HYSTERECTOMY  2010  . CHOLECYSTECTOMY  2000  . SPLENECTOMY, TOTAL     Social History   Social History Narrative   The patient is retired and married she has 4 children and grandchildren and great-grandchildren   No alcohol tobacco   family history includes CAD in her mother; Heart attack in her father; Kidney cancer in her mother.   Review of Systems  See HPI.  She fractured her pelvis her pubic ramus in the fall and she has had continued back pain and ambulation issues with that.  She has decreased hearing and uses hearing aids which she did not bring today.  She has some difficulty sleeping and urinary frequency and leakage at times.  All other review of systems are negative. Objective:   Physical Exam @BP  114/60 (BP Location: Left Arm, Patient Position: Sitting, Cuff Size: Normal)   Pulse 68   Ht 4' 11.5" (1.511 m) Comment: height measured without shoes  Wt 101 lb 2 oz (45.9 kg)   BMI 20.08 kg/m @  General:  Elderly ww, somewhat frail in no acute distress Eyes:  anicteric. ENT:   Mouth and posterior pharynx free of lesions. Dentures, decreased hearing Neck:   supple w/o thyromegaly or mass.  Lungs: Clear to auscultation  bilaterally. Heart:   S1S2, no rubs, murmurs, gallops. Abdomen:  soft, non-tender, no mass and BS+.  Lymph:  no cervical or supraclavicular adenopathy. Extremities:   no edema, cyanosis or clubbing Psych:  appropriate mood and  Affect.   Data Reviewed: See HPI I have reviewed the emergency room visit from today and labs in the EMR I have also reviewed imaging studies over the years CT scans chest x-rays and there is no indication of a hiatal hernia

## 2018-11-15 ENCOUNTER — Encounter: Payer: Self-pay | Admitting: Internal Medicine

## 2018-11-15 ENCOUNTER — Ambulatory Visit (AMBULATORY_SURGERY_CENTER): Payer: Medicare Other | Admitting: Internal Medicine

## 2018-11-15 VITALS — BP 149/79 | HR 68 | Temp 98.9°F | Resp 20 | Ht 59.5 in | Wt 101.0 lb

## 2018-11-15 DIAGNOSIS — K222 Esophageal obstruction: Secondary | ICD-10-CM | POA: Diagnosis not present

## 2018-11-15 DIAGNOSIS — R1310 Dysphagia, unspecified: Secondary | ICD-10-CM | POA: Diagnosis not present

## 2018-11-15 MED ORDER — OMEPRAZOLE 20 MG PO CPDR: 20.0000 mg | DELAYED_RELEASE_CAPSULE | Freq: Every day | ORAL | 3 refills | Status: DC

## 2018-11-15 MED ORDER — SODIUM CHLORIDE 0.9 % IV SOLN: 500.0000 mL | Freq: Once | INTRAVENOUS | Status: DC

## 2018-11-15 NOTE — Op Note (Signed)
[place] Patient Name: Mackenzie Madden Procedure Date: 11/15/2018 2:20 PM MRN: [id number] Endoscopist: Gatha Mayer , MD Age: 85 Referring MD:  Date of Birth: April 08, 1928 Gender: Female Account #: [id number] Procedure:                Upper GI endoscopy Indications:              Dysphagia Medicines:                Propofol per Anesthesia, Monitored Anesthesia Care Procedure:                Pre-Anesthesia Assessment:                           - Prior to the procedure, a History and Physical                            was performed, and patient medications and                            allergies were reviewed. The patient's tolerance of                            previous anesthesia was also reviewed. The risks                            and benefits of the procedure and the sedation                            options and risks were discussed with the patient.                            All questions were answered, and informed consent                            was obtained. Prior Anticoagulants: The patient has                            taken no previous anticoagulant or antiplatelet                            agents. ASA Grade Assessment: II - A patient with                            mild systemic disease. After reviewing the risks                            and benefits, the patient was deemed in                            satisfactory condition to undergo the procedure.                           After obtaining informed consent, the endoscope was  passed under direct vision. Throughout the                            procedure, the patient's blood pressure, pulse, and                            oxygen saturations were monitored continuously. The                            Endoscope was introduced through the mouth, and                            advanced to the second part of duodenum. The upper                            GI endoscopy was  accomplished without difficulty.                            The patient tolerated the procedure well. Scope In: Scope Out: Findings:                 A moderate Schatzki ring was found at the                            gastroesophageal junction. A TTS dilator was passed                            through the scope. Dilation with an 18-19-20 mm                            balloon dilator was performed to 18 mm. The                            dilation site was examined and showed mild mucosal                            disruption and moderate improvement in luminal                            narrowing. Estimated blood loss was minimal.                           The entire examined stomach was normal. There was                            retained mucoid and degraded bile that was mostly                            flushed or suctioned. Fundus and distal stomach.                           The examined duodenum was normal.  The cardia and gastric fundus were normal on                            retroflexion. Complications:            No immediate complications. Estimated Blood Loss:     Estimated blood loss was minimal. Impression:               - Moderate Schatzki ring. Dilated.                           - Normal stomach.                           - Normal examined duodenum.                           - No specimens collected. Recommendation:           - Patient has a contact number available for                            emergencies. The signs and symptoms of potential                            delayed complications were discussed with the                            patient. Return to normal activities tomorrow.                            Written discharge instructions were provided to the                            patient.                           - Clear liquids x 1 hour then soft foods rest of                            day. Start prior diet tomorrow.                            - Continue present medications.                           - Use Prilosec (omeprazole) 20 mg PO daily. Gatha Mayer, MD 11/15/2018 2:42:43 PM This report has been signed electronically.

## 2018-11-15 NOTE — Progress Notes (Signed)
To PACU, VSS. Report to [email or web address] 

## 2018-11-15 NOTE — Patient Instructions (Addendum)
There was a narrow area where esophagus and stomach join.  You have a Schatzki's  ring there. I dilated and think you will swallow better.  We think these are related to acid reflux so I am prescribing omeprazole for you to take to hopefully prevent the need for another dilation. You may pick that new medication up at [facility].  I appreciate the opportunity to care for you. Gatha Mayer, MD, [facility]   Omeprazole 20 mg daily ( 30 minutes before 1st meal of the day is best time to take this)-sent in to [facility] [address] for you to pick up   [place]:   Refer to the procedure report that was given to you for any specific questions about what was found during the examination.  If the procedure report does not answer your questions, please call your gastroenterologist to clarify.  If you requested that your care partner not be given the details of your procedure findings, then the procedure report has been included in a sealed envelope for you to review at your convenience later.  YOU SHOULD EXPECT: Some feelings of bloating in the abdomen. Passage of more gas than usual.  Walking can help get rid of the air that was put into your GI tract during the procedure and reduce the bloating. If you had a lower endoscopy (such as a colonoscopy or flexible sigmoidoscopy) you may notice spotting of blood in your stool or on the toilet paper. If you underwent a bowel prep for your procedure, you may not have a normal bowel movement for a few days.  Please Note:  You might notice some irritation and congestion in your nose or some drainage.  This is from the oxygen used during your procedure.  There is no need for concern and it should clear up in a day or so.  SYMPTOMS TO REPORT IMMEDIATELY:     Following upper endoscopy (EGD)  Vomiting of blood or coffee ground material  New chest pain or  pain under the shoulder blades  Painful or persistently difficult swallowing  New shortness of breath  Fever of 100F or higher  Black, tarry-looking stools  For urgent or emergent issues, a gastroenterologist can be reached at any hour by calling [phone].   DIET:  We do recommend a small meal at first, but then you may proceed to your regular diet.  Drink plenty of fluids but you should avoid alcoholic beverages for 24 hours.  ACTIVITY:  You should plan to take it easy for the rest of today and you should NOT DRIVE or use heavy machinery until tomorrow (because of the sedation medicines used during the test).    FOLLOW UP: Our staff will call the number listed on your records the next business day following your procedure to check on you and address any questions or concerns that you may have regarding the information given to you following your procedure. If we do not reach you, we will leave a message.  However, if you are feeling well and you are not experiencing any problems, there is no need to return our call.  We will assume that you have returned to your regular daily activities without incident.  If any biopsies were taken you will be contacted by phone or by letter within the next 1-3 weeks.  Please call us at [phone] if you  have not heard about the biopsies in 3 weeks.    SIGNATURES/CONFIDENTIALITY: You and/or your care partner have signed paperwork which will be entered into your electronic medical record.  These signatures attest to the fact that that the information above on your After Visit Summary has been reviewed and is understood.  Full responsibility of the confidentiality of this discharge information lies with you and/or your care-partner.

## 2018-11-15 NOTE — Progress Notes (Signed)
Called to room to assist during endoscopic procedure.  Patient ID and intended procedure confirmed with present staff. Received instructions for my participation in the procedure from the performing physician.  

## 2018-11-18 ENCOUNTER — Telehealth: Payer: Self-pay

## 2018-11-18 DIAGNOSIS — R1310 Dysphagia, unspecified: Secondary | ICD-10-CM

## 2018-11-18 NOTE — Telephone Encounter (Signed)
Office will schedule ba swallow with tablet

## 2018-11-18 NOTE — Telephone Encounter (Signed)
Patient notified of the barium swallow for [facility] hospital 11/20/18 9:15 arrival for 9:30.  Patient notified to be NPO for 3 hours prior.

## 2018-11-18 NOTE — Telephone Encounter (Signed)
Called and notified pt the office will be calling her to schedule a different test.

## 2018-11-18 NOTE — Addendum Note (Signed)
Addended by: Marlon Pel on: 11/18/2018 05:07 PM   Modules accepted: Orders

## 2018-11-18 NOTE — Telephone Encounter (Signed)
  Follow up Call-  No flowsheet data found.   Patient questions:  Do you have a fever, pain , or abdominal swelling? No. Pain Score  0 *  Have you tolerated food without any problems? No.  Have you been able to return to your normal activities? Yes.    Do you have any questions about your discharge instructions: Diet   Yes.   Medications  No. Follow up visit  No.  Do you have questions or concerns about your Care? No.  Actions: * If pain score is 4 or above: No action needed, pain <4.  Follow up call made this morning, pt verbalize her swallowing is no better and she is still having problems swallowing pills. Please advised. Pt is taking Omeprazole.

## 2018-11-20 ENCOUNTER — Ambulatory Visit (HOSPITAL_COMMUNITY)
Admission: RE | Admit: 2018-11-20 | Discharge: 2018-11-20 | Disposition: A | Discharge status: Home / Self Care | Payer: Medicare Other | Source: Ambulatory Visit | Attending: Internal Medicine | Admitting: Internal Medicine

## 2018-11-20 DIAGNOSIS — R1310 Dysphagia, unspecified: Secondary | ICD-10-CM | POA: Insufficient documentation

## 2018-11-20 NOTE — Progress Notes (Signed)
This test indicates the esophagus is not squeezing and relaxing normally  1) Is she having problems with all pills or just some? If so which ones? 2) Is she cutting meat into small pieces (needs dysphagia 3 diet)

## 2018-11-25 NOTE — Progress Notes (Signed)
Please have her f/u in 1 month to 6 weeks

## 2018-12-24 ENCOUNTER — Ambulatory Visit: Payer: Medicare Other | Admitting: Internal Medicine

## 2019-01-16 ENCOUNTER — Encounter (HOSPITAL_COMMUNITY): Payer: Self-pay | Admitting: *Deleted

## 2019-01-16 ENCOUNTER — Other Ambulatory Visit: Payer: Self-pay

## 2019-01-16 ENCOUNTER — Emergency Department (HOSPITAL_COMMUNITY)
Admission: EM | Admit: 2019-01-16 | Discharge: 2019-01-16 | Disposition: A | Discharge status: Home / Self Care | Payer: Medicare Other | Attending: Emergency Medicine | Admitting: Emergency Medicine

## 2019-01-16 DIAGNOSIS — Z23 Encounter for immunization: Secondary | ICD-10-CM | POA: Insufficient documentation

## 2019-01-16 DIAGNOSIS — R55 Syncope and collapse: Secondary | ICD-10-CM | POA: Diagnosis not present

## 2019-01-16 DIAGNOSIS — S51811A Laceration without foreign body of right forearm, initial encounter: Secondary | ICD-10-CM

## 2019-01-16 DIAGNOSIS — L989 Disorder of the skin and subcutaneous tissue, unspecified: Secondary | ICD-10-CM | POA: Diagnosis not present

## 2019-01-16 DIAGNOSIS — I110 Hypertensive heart disease with heart failure: Secondary | ICD-10-CM | POA: Insufficient documentation

## 2019-01-16 DIAGNOSIS — Z79899 Other long term (current) drug therapy: Secondary | ICD-10-CM | POA: Diagnosis not present

## 2019-01-16 DIAGNOSIS — E039 Hypothyroidism, unspecified: Secondary | ICD-10-CM | POA: Insufficient documentation

## 2019-01-16 DIAGNOSIS — I5032 Chronic diastolic (congestive) heart failure: Secondary | ICD-10-CM | POA: Insufficient documentation

## 2019-01-16 LAB — CBC
HCT: 40.6 % (ref 36.0–46.0)
Hemoglobin: 13.6 g/dL (ref 12.0–15.0)
MCH: 32.7 pg (ref 26.0–34.0)
MCHC: 33.5 g/dL (ref 30.0–36.0)
MCV: 97.6 fL (ref 80.0–100.0)
Platelets: 184 10*3/uL (ref 150–400)
RBC: 4.16 MIL/uL (ref 3.87–5.11)
RDW: 14 % (ref 11.5–15.5)
WBC: 10.4 10*3/uL (ref 4.0–10.5)
nRBC: 0 % (ref 0.0–0.2)

## 2019-01-16 LAB — COMPREHENSIVE METABOLIC PANEL
ALT: 11 U/L (ref 0–44)
AST: 20 U/L (ref 15–41)
Albumin: 3.1 g/dL — ABNORMAL LOW (ref 3.5–5.0)
Alkaline Phosphatase: 62 U/L (ref 38–126)
Anion gap: 9 (ref 5–15)
BUN: 11 mg/dL (ref 8–23)
CO2: 26 mmol/L (ref 22–32)
Calcium: 9.1 mg/dL (ref 8.9–10.3)
Chloride: 100 mmol/L (ref 98–111)
Creatinine, Ser: 0.71 mg/dL (ref 0.44–1.00)
GFR calc Af Amer: >60 mL/min (ref >60)
GFR calc non Af Amer: >60 mL/min (ref >60)
Glucose, Bld: 115 mg/dL — ABNORMAL HIGH (ref 70–99)
Potassium: 3.9 mmol/L (ref 3.5–5.1)
Sodium: 135 mmol/L (ref 135–145)
Total Bilirubin: 0.5 mg/dL (ref 0.3–1.2)
Total Protein: 5.9 g/dL — ABNORMAL LOW (ref 6.5–8.1)

## 2019-01-16 LAB — URINALYSIS, ROUTINE W REFLEX MICROSCOPIC
Bilirubin Urine: NEGATIVE
Glucose, UA: NEGATIVE mg/dL
Hgb urine dipstick: NEGATIVE
Ketones, ur: NEGATIVE mg/dL
Leukocytes,Ua: NEGATIVE
Nitrite: NEGATIVE
Protein, ur: NEGATIVE mg/dL
Specific Gravity, Urine: 1.008 (ref 1.005–1.030)
pH: 7 (ref 5.0–8.0)

## 2019-01-16 MED ORDER — SODIUM CHLORIDE 0.9 % IV BOLUS
500.0000 mL | Freq: Once | INTRAVENOUS | Status: AC
  Administered 2019-01-16: 13:00:00 500 mL via INTRAVENOUS

## 2019-01-16 MED ORDER — BACITRACIN ZINC 500 UNIT/GM EX OINT
TOPICAL_OINTMENT | Freq: Once | CUTANEOUS | Status: AC
  Administered 2019-01-16: 1 via TOPICAL

## 2019-01-16 MED ORDER — TETANUS-DIPHTH-ACELL PERTUSSIS 5-2.5-18.5 LF-MCG/0.5 IM SUSP
0.5000 mL | Freq: Once | INTRAMUSCULAR | Status: AC
  Administered 2019-01-16: 0.5 mL via INTRAMUSCULAR
  Filled 2019-01-16: qty 0.5

## 2019-01-16 NOTE — ED Triage Notes (Signed)
Per EMS Pt  Pt was sitting in chair at table with Family and passed out while sitting. Pt did not fall and Pt did not hit her head.  Pt A/O x4

## 2019-01-16 NOTE — ED Triage Notes (Signed)
PT has a skin tear on Rt posterior forearm . Pt reports the gauze was stuck to the skin and when she pulled the gauze off this morning it hurt so bad she felt funny. That was when her family sid she passed out.

## 2019-01-16 NOTE — ED Provider Notes (Addendum)
[place] EMERGENCY DEPARTMENT Provider Note   CSN: [id number] Arrival date & time: 01/16/19  1210    History   Chief Complaint Chief Complaint  Patient presents with  . Near Syncope    HPI RASHUNDA PASSON is a 85 y.o. female.     Patient brought by EMS. Indicates was siting in chair at table, was changing a dressing to right forearm from skin tear yesterday. States dressing stuck, was very painful, got nauseated, vomiting x 1 (not bloody or bilious), then got faint, ?brief syncopal vs near syncopal event. Did not fall to ground. Denies headache. No chest pain or discomfort. No palpitations. No abd pain. No diarrhea. No blood loss, gi bleeding or melena. No gu c/o. No fever or chills. Denies recent change in meds or new meds. States skin tear to right forearm occurred yesterday after she accidentally bumped area on edge of door. Tetanus unknown.   The history is provided by the patient and the EMS personnel.  Near Syncope  Pertinent negatives include no chest pain, no abdominal pain, no headaches and no shortness of breath.    Past Medical History:  Diagnosis Date  . Glaucoma   . Hypertension   . Thyroid disease     Patient Active Problem List   Diagnosis Date Noted  . Pubic ramus fracture, left, closed, initial encounter ([place])   . Leukocytosis   . Pubic ramus fracture ([place]) 08/11/2018  . Sacral fracture, closed ([place])   . Fall   . Acute lower UTI   . Laceration of bladder   . Abnormal transaminases 06/06/2016  . Atypical chest pain 06/04/2016  . Chest pain 06/04/2016  . Faintness   . Chronic diastolic heart failure ([place])   . Intraocular pressure increase   . Syncope 02/21/2015  . HTN (hypertension) 02/21/2015  . Hypothyroid 02/21/2015    Past Surgical History:  Procedure Laterality Date  . ABDOMINAL HYSTERECTOMY  2010  . CHOLECYSTECTOMY  2000  . SPLENECTOMY, TOTAL       OB History   No obstetric history on file.      Home  Medications    Prior to Admission medications   Medication Sig Start Date End Date Taking? Authorizing Provider  brimonidine-timolol (COMBIGAN) 0.2-0.5 % ophthalmic solution Place 1 drop into the left eye 3 (three) times daily.     [provider]  Calcium Carb-Cholecalciferol (CALCIUM 600 + D PO) Take 1,200 mg by mouth at bedtime.    [provider]  latanoprost (XALATAN) 0.005 % ophthalmic solution Place 1 drop into both eyes at bedtime. 01/11/15   [provider]  levothyroxine (SYNTHROID, LEVOTHROID) 100 MCG tablet Take 100 mcg by mouth daily before breakfast.    [provider]  Multiple Vitamin (MULTIVITAMIN WITH MINERALS) TABS tablet Take 1 tablet by mouth daily.    [provider]  naproxen sodium (ANAPROX) 220 MG tablet Take 220 mg by mouth daily as needed (pain). ALEVE    [provider]  omeprazole (PRILOSEC) 20 MG capsule Take 1 capsule (20 mg total) by mouth daily before breakfast. 30 minutes before 11/15/18   Gatha Mayer, MD  senna-docusate (SENOKOT-S) 8.6-50 MG tablet Take 1 tablet by mouth at bedtime. Patient taking differently: Take 1 tablet by mouth as needed.  06/07/16   Florencia Reasons, MD    Family History Family History  Problem Relation Age of Onset  . CAD Mother   . Kidney cancer Mother   . Heart attack  Father   . Colon cancer Neg Hx   . Esophageal cancer Neg Hx   . Stomach cancer Neg Hx   . Rectal cancer Neg Hx     Social History Social History   Tobacco Use  . Smoking status: Never Smoker  . Smokeless tobacco: Never Used  Substance Use Topics  . Alcohol use: No  . Drug use: No     Allergies   Patient has no known allergies.   Review of Systems Review of Systems  Constitutional: Negative for fever.  HENT: Negative for sore throat.   Eyes: Negative for redness.  Respiratory: Negative for cough and shortness of breath.   Cardiovascular: Positive for near-syncope. Negative for chest pain.   Gastrointestinal: Positive for vomiting. Negative for abdominal pain, blood in stool and diarrhea.  Endocrine: Negative for polyuria.  Genitourinary: Negative for dysuria and flank pain.  Musculoskeletal: Negative for back pain and neck pain.  Skin: Positive for wound.  Neurological: Negative for speech difficulty, weakness, numbness and headaches.  Hematological: Does not bruise/bleed easily.  Psychiatric/Behavioral: Negative for confusion.     Physical Exam Updated Vital Signs BP 109/76 (BP Location: Right Arm)   Pulse 62   Temp 97.8 F (36.6 C) (Oral)   Resp 16   Ht 1.524 m (5')   Wt 41.7 kg   SpO2 100%   BMI 17.97 kg/m   Physical Exam Vitals signs and nursing note reviewed.  Constitutional:      Appearance: Normal appearance. She is well-developed.  HENT:     Head: Atraumatic.     Nose: Nose normal.     Mouth/Throat:     Mouth: Mucous membranes are moist.  Eyes:     General: No scleral icterus.    Conjunctiva/sclera: Conjunctivae normal.     Pupils: Pupils are equal, round, and reactive to light.  Neck:     Musculoskeletal: Normal range of motion and neck supple. No neck rigidity or muscular tenderness.     Vascular: No carotid bruit.     Trachea: No tracheal deviation.  Cardiovascular:     Rate and Rhythm: Regular rhythm. Bradycardia present.     Pulses: Normal pulses.     Heart sounds: Normal heart sounds. No murmur. No friction rub. No gallop.   Pulmonary:     Effort: Pulmonary effort is normal. No respiratory distress.     Breath sounds: Normal breath sounds.  Abdominal:     General: Bowel sounds are normal. There is no distension.     Palpations: Abdomen is soft. There is no mass.     Tenderness: There is no abdominal tenderness. There is no guarding or rebound.     Hernia: No hernia is present.     Comments: No puls mass.   Genitourinary:    Comments: No cva tenderness.  Musculoskeletal:        General: No swelling.     Right lower leg: No edema.      Left lower leg: No edema.     Comments: Skin tear to right forearm, approximately 1.5 by 2.5 cm.   Skin:    General: Skin is warm and dry.     Findings: No rash.  Neurological:     Mental Status: She is alert.     Comments: Alert, speech normal. Motor intact bil, stre 5/5. sens grossly intact bil. Steady gait.   Psychiatric:        Mood and Affect: Mood normal.      ED  Treatments / Results  Labs (all labs ordered are listed, but only abnormal results are displayed) Labs Reviewed  CBC  COMPREHENSIVE METABOLIC PANEL  URINALYSIS, ROUTINE W REFLEX MICROSCOPIC    EKG EKG Interpretation  Date/Time:  Thursday January 16 2019 12:14:25 EDT Ventricular Rate:  53 PR Interval:    QRS Duration: 154 QT Interval:  486 QTC Calculation: 457 R Axis:   21 Text Interpretation:  Sinus rhythm Left bundle branch block Non-specific ST-t changes Reconfirmed by Lajean Saver [phone]) on 01/16/2019 12:26:10 PM   Radiology No results found.  Procedures Procedures (including critical care time)  Medications Ordered in ED Medications  sodium chloride 0.9 % bolus 500 mL (has no administration in time range)  Tdap (BOOSTRIX) injection 0.5 mL (has no administration in time range)  bacitracin ointment (has no administration in time range)     Initial Impression / Assessment and Plan / ED Course  I have reviewed the triage vital signs and the nursing notes.  Pertinent labs & imaging results that were available during my care of the patient were reviewed by me and considered in my medical decision making (see chart for details).  Iv ns bolus. Ecg. Labs sent. Cardiac monitoring.   Reviewed nursing notes and prior charts for additional history.   Wound cleaned, bacitracin and sterile dressing applied.  Tetanus IM.  Trial of po fluids.   Ambulate in hall.   Recheck, no faintness or dizziness. Tolerating po well. No recurrent nv.  Remains in sinus rhythm.   Pt completely asymptomatic  currently.   Suspect  Vasovagal episode earlier. Pt currently appears stable for d/c.     Final Clinical Impressions(s) / ED Diagnoses   Final diagnoses:  None    ED Discharge Orders    None          Lajean Saver, MD 01/16/19 1539

## 2019-01-16 NOTE — Discharge Instructions (Addendum)
It was our pleasure to provide your ER care today - we hope that you feel better.  Rest. Drink plenty of fluids.  Follow up with primary care doctor.   Return to ER if worse, new symptoms, fevers, chest pain, trouble breathing, fainting, other concern.

## 2019-07-29 IMAGING — RF DG ESOPHAGUS
9 of 12 series · 13 of 24 positions shown · non-contrast
Comparison: None.

CLINICAL DATA: Esophageal dysphagia. History of recent esophageal
dilatation.

EXAM:
ESOPHOGRAM/BARIUM SWALLOW
TECHNIQUE: Single contrast examination was performed using thin barium or water
soluble.
FLUOROSCOPY TIME:  Fluoroscopy Time:  3 minutes and 0 seconds
Radiation Exposure Index (if provided by the fluoroscopic device):
25.20 mGy
Number of Acquired Spot Images: 0

[Series 1: cp_standard · 0.51mm/px · 2 of 47 frames shown (1 of 9)]
[frame 8/47]
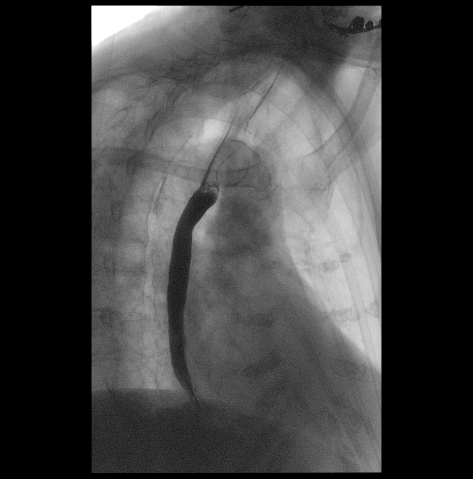
[frame 40/47]
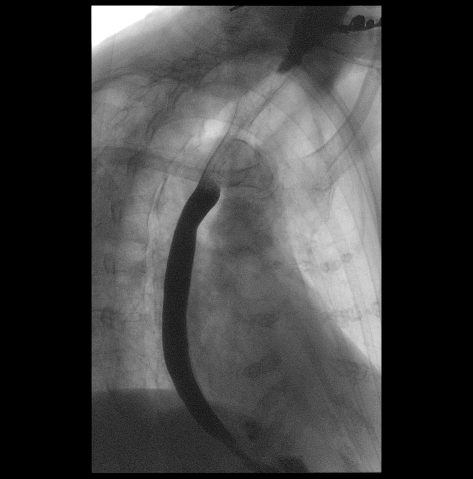

[Series 3: cp_standard · 0.34mm/px · 2 of 58 frames shown (2 of 9)]
[frame 1/58]
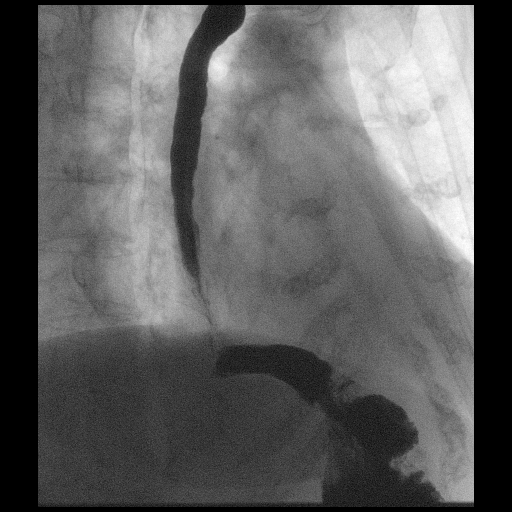
[frame 50/58]
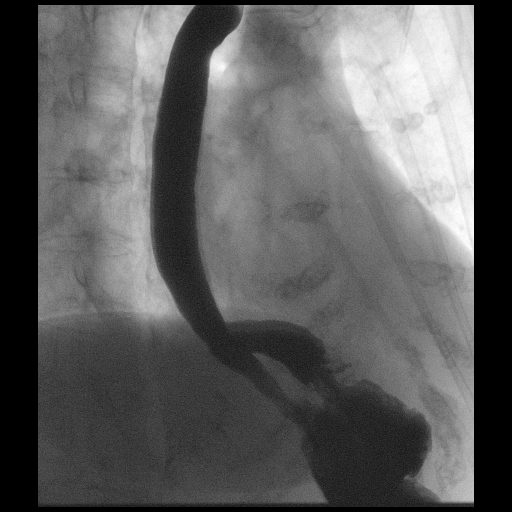

[Series 5: cp_standard · 0.34mm/px · 2 of 63 frames shown (3 of 9)]
[frame 2/63]
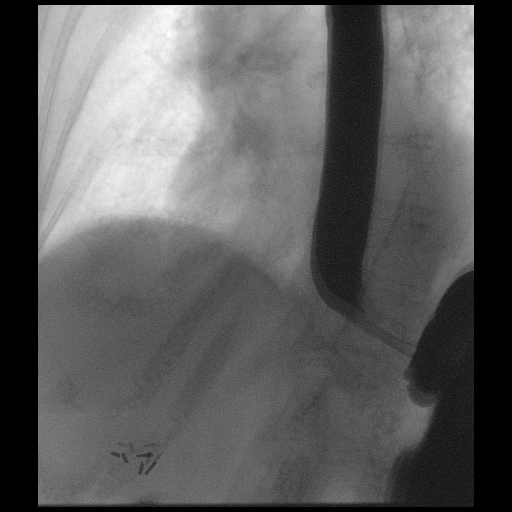
[frame 54/63]
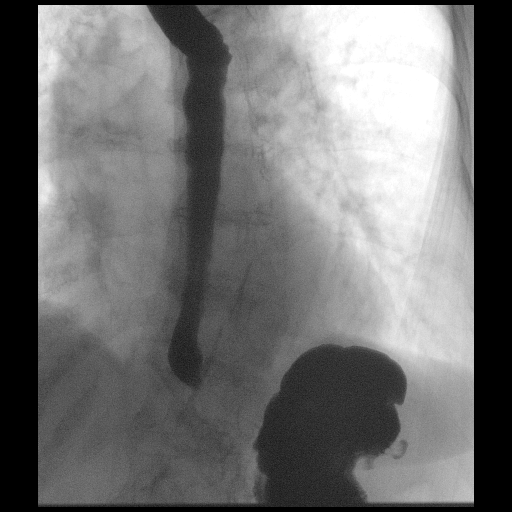

[Series 7: cp_standard · 0.34mm/px · 2 of 39 frames shown (4 of 9)]
[frame 6/39]
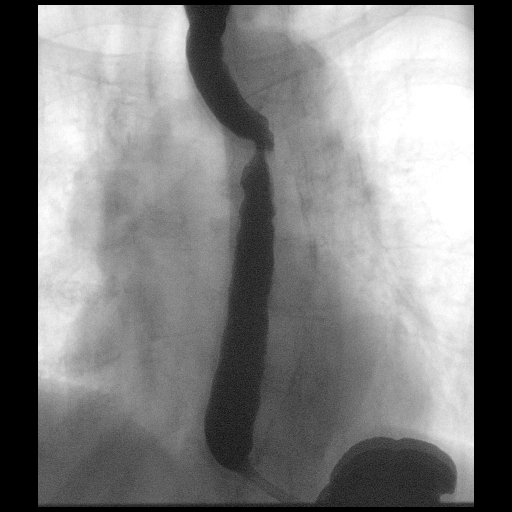
[frame 20/39]
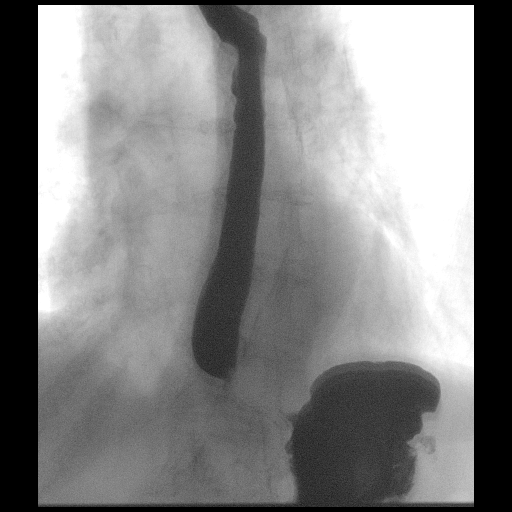

[Series 8: cp_standard · 0.17mm/px · 1 of 1 slices shown (5 of 9)]
[im 1/1]
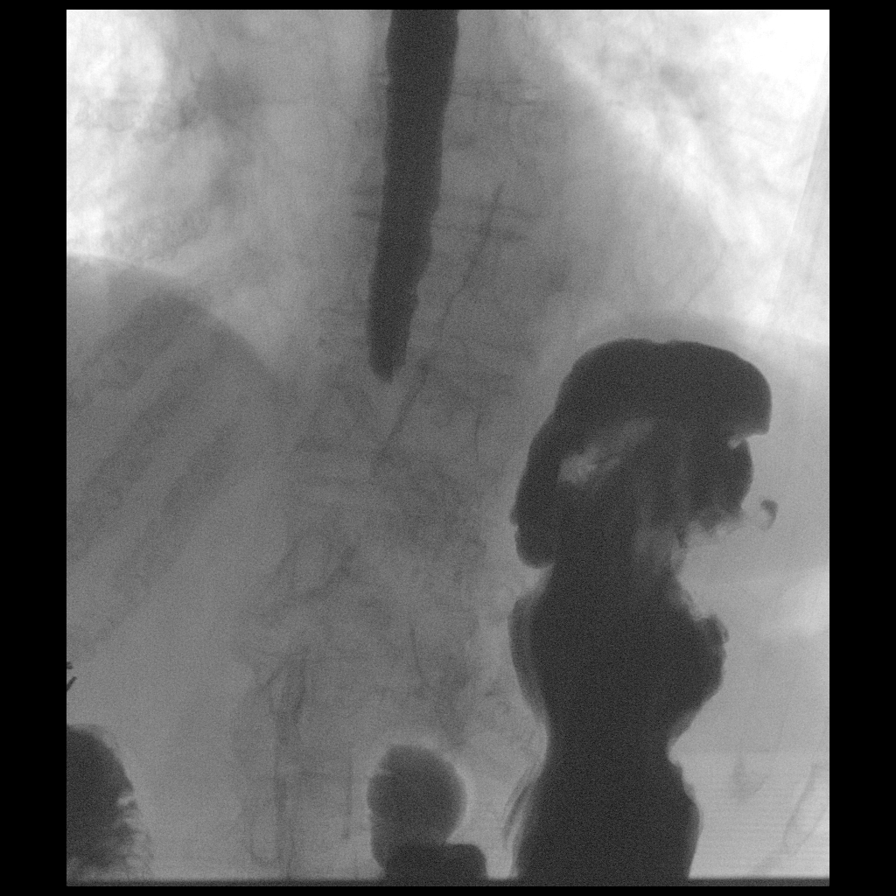

[Series 9: cp_standard · 0.34mm/px · 1 of 54 frames shown (6 of 9)]
[frame 28/54]
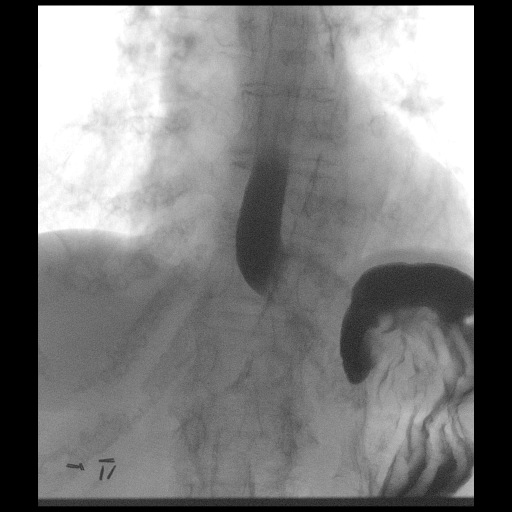

[Series 10: cp_standard · 0.17mm/px · 1 of 1 slices shown (7 of 9)]
[im 1/1]
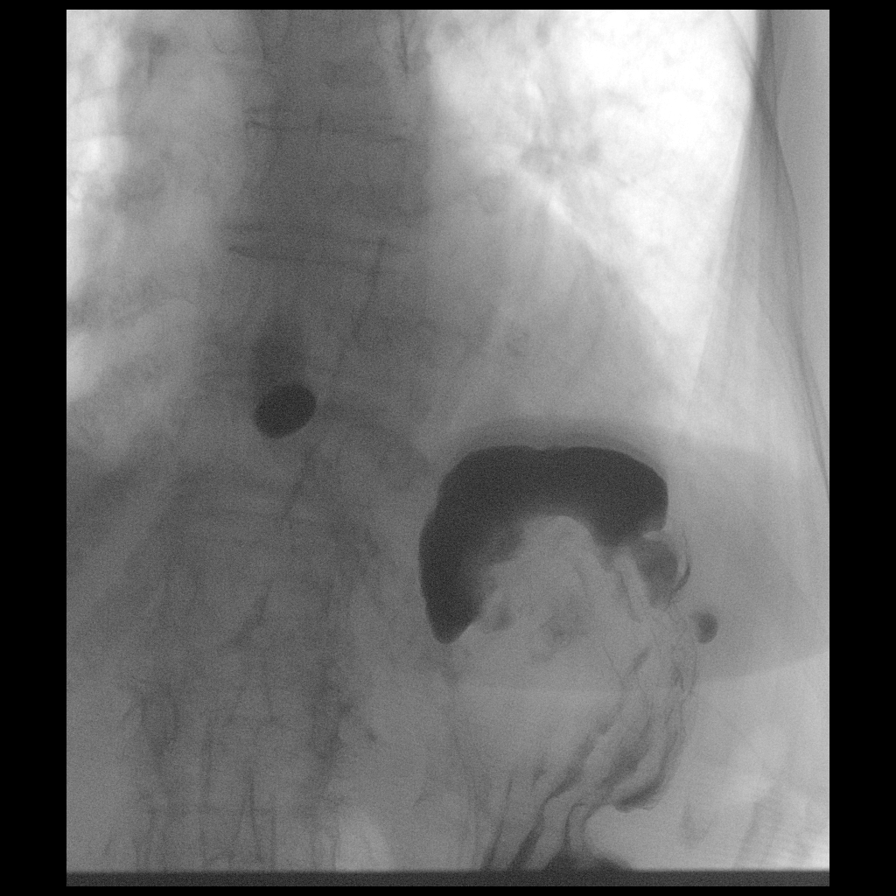

[Series 11: cp_standard · 0.34mm/px · 1 of 34 frames shown (8 of 9)]
[frame 18/34]
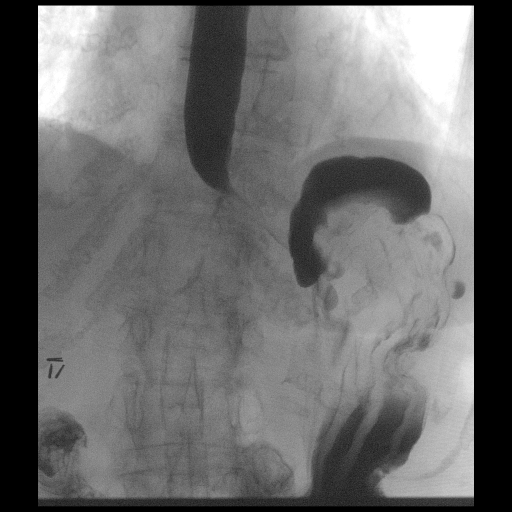

[Series 12: cp_standard · 0.17mm/px · 1 of 1 slices shown (9 of 9)]
[im 1/1]
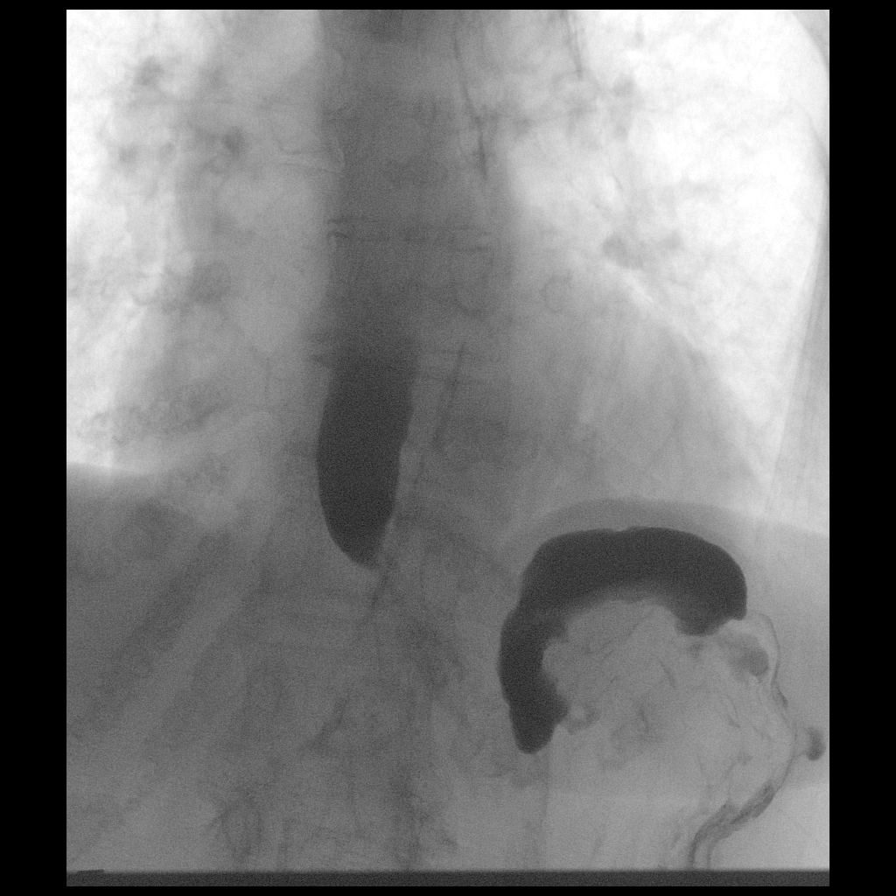

[13 of 24 positions shown; findings below may reference images not displayed]

FINDINGS: Nonspecific esophageal motility disorder with disruption of the
primary peristaltic wave and tertiary contractions. Moderate
esophageal stasis. No hiatal hernia was demonstrated. Mild smooth
narrowing of the distal esophagus but no mass. The 13 mm barium pill
would not pass through this area.

No GE reflux was demonstrated.

Diverticulum of the gastric fundus is noted.
IMPRESSION: 1. Nonspecific esophageal motility disorder with moderate esophageal
stasis.
2. Smooth narrowing of the distal esophagus near the GE junction
without definite mass. The 13 mm barium pill would not pass through
this area.
3. No obvious hiatal hernia.

## 2019-08-04 ENCOUNTER — Inpatient Hospital Stay (HOSPITAL_COMMUNITY)
Admission: EM | Admit: 2019-08-04 | Discharge: 2019-08-07 | DRG: 481 | Disposition: A | Discharge status: Home Health | Payer: Medicare Other | Attending: Internal Medicine | Admitting: Internal Medicine

## 2019-08-04 ENCOUNTER — Encounter (HOSPITAL_COMMUNITY): Payer: Self-pay

## 2019-08-04 ENCOUNTER — Emergency Department (HOSPITAL_COMMUNITY): Payer: Medicare Other

## 2019-08-04 DIAGNOSIS — I110 Hypertensive heart disease with heart failure: Secondary | ICD-10-CM | POA: Diagnosis present

## 2019-08-04 DIAGNOSIS — S72144D Nondisplaced intertrochanteric fracture of right femur, subsequent encounter for closed fracture with routine healing: Secondary | ICD-10-CM | POA: Diagnosis not present

## 2019-08-04 DIAGNOSIS — W19XXXA Unspecified fall, initial encounter: Secondary | ICD-10-CM

## 2019-08-04 DIAGNOSIS — Z79890 Hormone replacement therapy (postmenopausal): Secondary | ICD-10-CM

## 2019-08-04 DIAGNOSIS — Z9889 Other specified postprocedural states: Secondary | ICD-10-CM

## 2019-08-04 DIAGNOSIS — D62 Acute posthemorrhagic anemia: Secondary | ICD-10-CM | POA: Diagnosis not present

## 2019-08-04 DIAGNOSIS — E039 Hypothyroidism, unspecified: Secondary | ICD-10-CM | POA: Diagnosis present

## 2019-08-04 DIAGNOSIS — H409 Unspecified glaucoma: Secondary | ICD-10-CM | POA: Diagnosis present

## 2019-08-04 DIAGNOSIS — I10 Essential (primary) hypertension: Secondary | ICD-10-CM | POA: Diagnosis present

## 2019-08-04 DIAGNOSIS — S72001A Fracture of unspecified part of neck of right femur, initial encounter for closed fracture: Secondary | ICD-10-CM

## 2019-08-04 DIAGNOSIS — Y92009 Unspecified place in unspecified non-institutional (private) residence as the place of occurrence of the external cause: Secondary | ICD-10-CM

## 2019-08-04 DIAGNOSIS — S72009A Fracture of unspecified part of neck of unspecified femur, initial encounter for closed fracture: Secondary | ICD-10-CM | POA: Diagnosis present

## 2019-08-04 DIAGNOSIS — S7291XA Unspecified fracture of right femur, initial encounter for closed fracture: Secondary | ICD-10-CM | POA: Diagnosis present

## 2019-08-04 DIAGNOSIS — Z20828 Contact with and (suspected) exposure to other viral communicable diseases: Secondary | ICD-10-CM | POA: Diagnosis present

## 2019-08-04 DIAGNOSIS — D72828 Other elevated white blood cell count: Secondary | ICD-10-CM | POA: Diagnosis present

## 2019-08-04 DIAGNOSIS — R2681 Unsteadiness on feet: Secondary | ICD-10-CM | POA: Diagnosis present

## 2019-08-04 DIAGNOSIS — S72011A Unspecified intracapsular fracture of right femur, initial encounter for closed fracture: Principal | ICD-10-CM | POA: Diagnosis present

## 2019-08-04 DIAGNOSIS — K219 Gastro-esophageal reflux disease without esophagitis: Secondary | ICD-10-CM | POA: Diagnosis present

## 2019-08-04 DIAGNOSIS — I447 Left bundle-branch block, unspecified: Secondary | ICD-10-CM | POA: Diagnosis present

## 2019-08-04 DIAGNOSIS — D72829 Elevated white blood cell count, unspecified: Secondary | ICD-10-CM | POA: Diagnosis not present

## 2019-08-04 DIAGNOSIS — R296 Repeated falls: Secondary | ICD-10-CM | POA: Diagnosis present

## 2019-08-04 DIAGNOSIS — N390 Urinary tract infection, site not specified: Secondary | ICD-10-CM | POA: Diagnosis present

## 2019-08-04 DIAGNOSIS — W010XXA Fall on same level from slipping, tripping and stumbling without subsequent striking against object, initial encounter: Secondary | ICD-10-CM | POA: Diagnosis present

## 2019-08-04 DIAGNOSIS — Z9181 History of falling: Secondary | ICD-10-CM

## 2019-08-04 DIAGNOSIS — Z419 Encounter for procedure for purposes other than remedying health state, unspecified: Secondary | ICD-10-CM

## 2019-08-04 DIAGNOSIS — I5032 Chronic diastolic (congestive) heart failure: Secondary | ICD-10-CM | POA: Diagnosis present

## 2019-08-04 LAB — CBC WITH DIFFERENTIAL/PLATELET
Abs Immature Granulocytes: 0.04 10*3/uL (ref 0.00–0.07)
Basophils Absolute: 0.1 10*3/uL (ref 0.0–0.1)
Basophils Relative: 0 %
Eosinophils Absolute: 0.1 10*3/uL (ref 0.0–0.5)
Eosinophils Relative: 1 %
HCT: 45.9 % (ref 36.0–46.0)
Hemoglobin: 15 g/dL (ref 12.0–15.0)
Immature Granulocytes: 0 %
Lymphocytes Relative: 15 %
Lymphs Abs: 1.7 10*3/uL (ref 0.7–4.0)
MCH: 31.5 pg (ref 26.0–34.0)
MCHC: 32.7 g/dL (ref 30.0–36.0)
MCV: 96.4 fL (ref 80.0–100.0)
Monocytes Absolute: 1 10*3/uL (ref 0.1–1.0)
Monocytes Relative: 9 %
Neutro Abs: 8.5 10*3/uL — ABNORMAL HIGH (ref 1.7–7.7)
Neutrophils Relative %: 75 %
Platelets: 158 10*3/uL (ref 150–400)
RBC: 4.76 MIL/uL (ref 3.87–5.11)
RDW: 14.1 % (ref 11.5–15.5)
WBC: 11.3 10*3/uL — ABNORMAL HIGH (ref 4.0–10.5)
nRBC: 0 % (ref 0.0–0.2)

## 2019-08-04 LAB — BASIC METABOLIC PANEL
Anion gap: 8 (ref 5–15)
BUN: 13 mg/dL (ref 8–23)
CO2: 27 mmol/L (ref 22–32)
Calcium: 9.6 mg/dL (ref 8.9–10.3)
Chloride: 101 mmol/L (ref 98–111)
Creatinine, Ser: 0.69 mg/dL (ref 0.44–1.00)
GFR calc Af Amer: >60 mL/min (ref >60)
GFR calc non Af Amer: >60 mL/min (ref >60)
Glucose, Bld: 114 mg/dL — ABNORMAL HIGH (ref 70–99)
Potassium: 3.8 mmol/L (ref 3.5–5.1)
Sodium: 136 mmol/L (ref 135–145)

## 2019-08-04 LAB — TYPE AND SCREEN
ABO/RH(D): O POS
Antibody Screen: NEGATIVE

## 2019-08-04 LAB — PROTIME-INR
INR: 0.9 (ref 0.8–1.2)
Prothrombin Time: 12.5 seconds (ref 11.4–15.2)

## 2019-08-04 LAB — SARS CORONAVIRUS 2 BY RT PCR (HOSPITAL ORDER, PERFORMED IN ~~LOC~~ HOSPITAL LAB): SARS Coronavirus 2: NEGATIVE

## 2019-08-04 MED ORDER — CALCIUM CARBONATE ANTACID 500 MG PO CHEW
500.0000 mg | CHEWABLE_TABLET | Freq: Every day | ORAL | Status: DC
  Filled 2019-08-04: qty 1

## 2019-08-04 MED ORDER — ACETAMINOPHEN 500 MG PO TABS
1000.0000 mg | ORAL_TABLET | Freq: Once | ORAL | Status: AC
  Administered 2019-08-04: 16:00:00 1000 mg via ORAL
  Filled 2019-08-04: qty 2

## 2019-08-04 MED ORDER — NAPROXEN 250 MG PO TABS
250.0000 mg | ORAL_TABLET | Freq: Every day | ORAL | Status: DC | PRN
  Filled 2019-08-04: qty 1

## 2019-08-04 MED ORDER — FLEET ENEMA 7-19 GM/118ML RE ENEM: 1.0000 | ENEMA | Freq: Once | RECTAL | Status: DC | PRN

## 2019-08-04 MED ORDER — BRIMONIDINE TARTRATE-TIMOLOL 0.2-0.5 % OP SOLN: 1.0000 [drp] | Freq: Two times a day (BID) | OPHTHALMIC | Status: DC

## 2019-08-04 MED ORDER — HYDROCODONE-ACETAMINOPHEN 5-325 MG PO TABS
1.0000 | ORAL_TABLET | Freq: Four times a day (QID) | ORAL | Status: DC | PRN
  Administered 2019-08-05: 05:00:00 1 via ORAL
  Filled 2019-08-04 (×2): qty 1

## 2019-08-04 MED ORDER — ONDANSETRON HCL 4 MG/2ML IJ SOLN: 4.0000 mg | Freq: Four times a day (QID) | INTRAMUSCULAR | Status: DC | PRN

## 2019-08-04 MED ORDER — CARVEDILOL 3.125 MG PO TABS
3.1250 mg | ORAL_TABLET | Freq: Two times a day (BID) | ORAL | Status: DC
  Administered 2019-08-04 – 2019-08-07 (×5): 3.125 mg via ORAL
  Filled 2019-08-04 (×5): qty 1

## 2019-08-04 MED ORDER — SENNA 8.6 MG PO TABS
1.0000 | ORAL_TABLET | Freq: Two times a day (BID) | ORAL | Status: DC
  Administered 2019-08-04 – 2019-08-07 (×6): 8.6 mg via ORAL
  Filled 2019-08-04 (×6): qty 1

## 2019-08-04 MED ORDER — PANTOPRAZOLE SODIUM 40 MG PO TBEC
40.0000 mg | DELAYED_RELEASE_TABLET | Freq: Every day | ORAL | Status: DC
  Administered 2019-08-04 – 2019-08-07 (×4): 40 mg via ORAL
  Filled 2019-08-04 (×4): qty 1

## 2019-08-04 MED ORDER — ONDANSETRON HCL 4 MG/2ML IJ SOLN
4.0000 mg | Freq: Once | INTRAMUSCULAR | Status: AC
  Administered 2019-08-04: 17:00:00 4 mg via INTRAVENOUS
  Filled 2019-08-04: qty 2

## 2019-08-04 MED ORDER — POLYETHYLENE GLYCOL 3350 17 G PO PACK: 17.0000 g | PACK | Freq: Every day | ORAL | Status: DC | PRN

## 2019-08-04 MED ORDER — METHOCARBAMOL 1000 MG/10ML IJ SOLN
500.0000 mg | Freq: Four times a day (QID) | INTRAVENOUS | Status: DC | PRN
  Filled 2019-08-04: qty 5

## 2019-08-04 MED ORDER — BRIMONIDINE TARTRATE 0.2 % OP SOLN
1.0000 [drp] | Freq: Two times a day (BID) | OPHTHALMIC | Status: DC
  Administered 2019-08-05 – 2019-08-07 (×5): 1 [drp] via OPHTHALMIC
  Filled 2019-08-04 (×2): qty 5

## 2019-08-04 MED ORDER — SORBITOL 70 % SOLN: 30.0000 mL | Freq: Every day | Status: DC | PRN

## 2019-08-04 MED ORDER — SENNOSIDES-DOCUSATE SODIUM 8.6-50 MG PO TABS: 1.0000 | ORAL_TABLET | Freq: Every day | ORAL | Status: DC

## 2019-08-04 MED ORDER — FENTANYL CITRATE (PF) 100 MCG/2ML IJ SOLN
50.0000 ug | INTRAMUSCULAR | Status: DC | PRN
  Administered 2019-08-04: 17:00:00 50 ug via INTRAVENOUS
  Filled 2019-08-04: qty 2

## 2019-08-04 MED ORDER — TIMOLOL MALEATE 0.5 % OP SOLN
1.0000 [drp] | Freq: Two times a day (BID) | OPHTHALMIC | Status: DC
  Administered 2019-08-05 – 2019-08-07 (×5): 1 [drp] via OPHTHALMIC
  Filled 2019-08-04 (×2): qty 5

## 2019-08-04 MED ORDER — ADULT MULTIVITAMIN W/MINERALS CH
1.0000 | ORAL_TABLET | Freq: Every day | ORAL | Status: DC
  Administered 2019-08-06 – 2019-08-07 (×2): 1 via ORAL
  Filled 2019-08-04: qty 1

## 2019-08-04 MED ORDER — LEVOTHYROXINE SODIUM 88 MCG PO TABS
88.0000 ug | ORAL_TABLET | Freq: Every day | ORAL | Status: DC
  Administered 2019-08-05 – 2019-08-07 (×3): 88 ug via ORAL
  Filled 2019-08-04 (×3): qty 1

## 2019-08-04 MED ORDER — METHOCARBAMOL 500 MG PO TABS
500.0000 mg | ORAL_TABLET | Freq: Four times a day (QID) | ORAL | Status: DC | PRN
  Filled 2019-08-04: qty 1

## 2019-08-04 MED ORDER — IRBESARTAN 75 MG PO TABS
37.5000 mg | ORAL_TABLET | Freq: Every day | ORAL | Status: DC
  Administered 2019-08-04 – 2019-08-07 (×4): 37.5 mg via ORAL
  Filled 2019-08-04 (×4): qty 1

## 2019-08-04 MED ORDER — LATANOPROST 0.005 % OP SOLN
1.0000 [drp] | Freq: Every day | OPHTHALMIC | Status: DC
  Administered 2019-08-05 – 2019-08-06 (×2): 1 [drp] via OPHTHALMIC
  Filled 2019-08-04 (×2): qty 2.5

## 2019-08-04 MED ORDER — ACETAMINOPHEN 325 MG PO TABS: 650.0000 mg | ORAL_TABLET | ORAL | Status: DC | PRN

## 2019-08-04 MED ORDER — SODIUM CHLORIDE 0.9 % IV SOLN
INTRAVENOUS | Status: AC
  Administered 2019-08-04 – 2019-08-05 (×2): via INTRAVENOUS

## 2019-08-04 MED ORDER — MORPHINE SULFATE (PF) 2 MG/ML IV SOLN: 0.5000 mg | INTRAVENOUS | Status: DC | PRN

## 2019-08-04 NOTE — ED Triage Notes (Signed)
Pt presents via EMS from home with c/o fall. Pt fell onto her right side, c/o hip pain 4/10. No deformities noted by EMS. No blood thinners, pt denies hitting her head as she fell.

## 2019-08-04 NOTE — ED Notes (Signed)
Called Shalom, RN to give report and she is currently taking another admission. Will call back at 2000 to see if patient can go upstairs.

## 2019-08-04 NOTE — ED Provider Notes (Addendum)
[facility] DEPT Provider Note   CSN: [id number] Arrival date & time: 08/04/19  1417     History   Chief Complaint Chief Complaint  Patient presents with  . Fall    HPI Mackenzie Madden is a 85 y.o. female.     Patient is an 85 year old female with a history of hypertension, thyroid disease and chronic diastolic heart failure who presents today after a fall at home.  She states she was following behind her husband when her feet must have gotten tangled up and she fell onto her right side.  She was unable to get up due to pain in her pelvis and right hip.  She denies any numbness or tingling of the leg, head injury to her head or loss of consciousness.  She has no neck or back pain.  She otherwise was feeling well today before the incident.  He has had no recent medication changes.  The history is provided by the patient.  Fall This is a new problem. The current episode started 1 to 2 hours ago. The problem occurs constantly. The problem has not changed since onset.Pertinent negatives include no chest pain, no abdominal pain, no headaches and no shortness of breath.    Past Medical History:  Diagnosis Date  . Glaucoma   . Hypertension   . Thyroid disease     Patient Active Problem List   Diagnosis Date Noted  . Pubic ramus fracture, left, closed, initial encounter ([place])   . Leukocytosis   . Pubic ramus fracture ([place]) 08/11/2018  . Sacral fracture, closed ([place])   . Fall   . Acute lower UTI   . Laceration of bladder   . Abnormal transaminases 06/06/2016  . Atypical chest pain 06/04/2016  . Chest pain 06/04/2016  . Faintness   . Chronic diastolic heart failure ([place])   . Intraocular pressure increase   . Syncope 02/21/2015  . HTN (hypertension) 02/21/2015  . Hypothyroid 02/21/2015    Past Surgical History:  Procedure Laterality Date  . ABDOMINAL HYSTERECTOMY  2010  . CHOLECYSTECTOMY  2000  . SPLENECTOMY, TOTAL       OB History    No obstetric history on file.      Home Medications    Prior to Admission medications   Medication Sig Start Date End Date Taking? Authorizing Provider  brimonidine-timolol (COMBIGAN) 0.2-0.5 % ophthalmic solution Place 1 drop into the left eye 3 (three) times daily.     [provider]  latanoprost (XALATAN) 0.005 % ophthalmic solution Place 1 drop into both eyes at bedtime. 01/11/15   [provider]  levothyroxine (SYNTHROID, LEVOTHROID) 100 MCG tablet Take 100 mcg by mouth daily before breakfast.    [provider]  Multiple Vitamin (MULTIVITAMIN WITH MINERALS) TABS tablet Take 1 tablet by mouth daily.    [provider]  naproxen sodium (ANAPROX) 220 MG tablet Take 220 mg by mouth daily as needed (pain). ALEVE    [provider]  omeprazole (PRILOSEC) 20 MG capsule Take 1 capsule (20 mg total) by mouth daily before breakfast. 30 minutes before Patient not taking: Reported on 01/16/2019 11/15/18   Gatha Mayer, MD  senna-docusate (SENOKOT-S) 8.6-50 MG tablet Take 1 tablet by mouth at bedtime. Patient not taking: Reported on 01/16/2019 06/07/16   Florencia Reasons, MD    Family History Family History  Problem Relation Age of Onset  . CAD Mother   . Kidney cancer Mother   . Heart attack  Father   . Colon cancer Neg Hx   . Esophageal cancer Neg Hx   . Stomach cancer Neg Hx   . Rectal cancer Neg Hx     Social History Social History   Tobacco Use  . Smoking status: Never Smoker  . Smokeless tobacco: Never Used  Substance Use Topics  . Alcohol use: No  . Drug use: No     Allergies   Patient has no known allergies.   Review of Systems Review of Systems  Respiratory: Negative for shortness of breath.   Cardiovascular: Negative for chest pain.  Gastrointestinal: Negative for abdominal pain.  Neurological: Negative for headaches.  All other systems reviewed and are negative.    Physical Exam Updated Vital Signs BP (!) 172/97 (BP  Location: Left Arm)   Pulse 78   Temp 98.6 F (37 C) (Oral)   Resp (!) 21   SpO2 94%   Physical Exam Vitals signs and nursing note reviewed.  Constitutional:      General: She is not in acute distress.    Appearance: She is well-developed.  HENT:     Head: Normocephalic and atraumatic.     Mouth/Throat:     Mouth: Mucous membranes are moist.  Eyes:     Conjunctiva/sclera: Conjunctivae normal.     Pupils: Pupils are equal, round, and reactive to light.  Neck:     Musculoskeletal: Normal range of motion and neck supple. No neck rigidity or muscular tenderness.  Cardiovascular:     Rate and Rhythm: Normal rate and regular rhythm.     Heart sounds: No murmur.  Pulmonary:     Effort: Pulmonary effort is normal. No respiratory distress.     Breath sounds: Normal breath sounds. No wheezing or rales.  Abdominal:     General: There is no distension.     Palpations: Abdomen is soft.     Tenderness: There is no abdominal tenderness. There is no guarding or rebound.  Musculoskeletal:        General: Tenderness present.     Right hip: She exhibits decreased range of motion, tenderness and bony tenderness.     Left hip: Normal.     Right knee: Normal.       Legs:  Skin:    General: Skin is warm and dry.     Findings: No erythema or rash.  Neurological:     General: No focal deficit present.     Mental Status: She is alert and oriented to person, place, and time. Mental status is at baseline.  Psychiatric:        Mood and Affect: Mood normal.        Behavior: Behavior normal.        Thought Content: Thought content normal.      ED Treatments / Results  Labs (all labs ordered are listed, but only abnormal results are displayed) Labs Reviewed  BASIC METABOLIC PANEL - Abnormal; Notable for the following components:      Result Value   Glucose, Bld 114 (*)    All other components within normal limits  CBC WITH DIFFERENTIAL/PLATELET - Abnormal; Notable for the following  components:   WBC 11.3 (*)    Neutro Abs 8.5 (*)    All other components within normal limits  SARS CORONAVIRUS 2 BY RT PCR [facility] ORDER, [place] LAB)  PROTIME-INR  TYPE AND SCREEN    EKG EKG Interpretation  Date/Time:  Monday August 04 2019 15:03:28  EST Ventricular Rate:  77 PR Interval:    QRS Duration: 146 QT Interval:  408 QTC Calculation: 462 R Axis:   36 Text Interpretation: Sinus rhythm Left bundle branch block No significant change since last tracing Confirmed by Blanchie Dessert [phone]) on 08/04/2019 3:25:46 PM   Radiology Dg Chest 1 View  Result Date: 08/04/2019 CLINICAL DATA:  Right hip pain following a fall today. EXAM: CHEST  1 VIEW COMPARISON:  11/11/2018 FINDINGS: Stable enlarged cardiac silhouette and tortuous and calcified thoracic aorta. The lungs remain clear and hyperexpanded with mild diffuse peribronchial thickening. Old, healed 8th and 9th rib fractures on the right. No acute fracture or pneumothorax. IMPRESSION: 1. No acute abnormality. 2. Stable cardiomegaly and changes of COPD and chronic bronchitis. 3. Aortic atherosclerosis. Electronically Signed   By: Claudie Revering M.D.   On: 08/04/2019 15:53   Dg Hip Unilat With Pelvis 2-3 Views Right  Result Date: 08/04/2019 CLINICAL DATA:  Fall, hip pain EXAM: DG HIP (WITH OR WITHOUT PELVIS) 2-3V RIGHT COMPARISON:  None. FINDINGS: There is anatomic alignment at the right hip joint. Acute fracture at base of femoral head with mild impaction. 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 IMPRESSION: Acute right subcapital femoral fracture with mild impaction. Electronically Signed   By: Macy Mis M.D.   On: 08/04/2019 15:54    Procedures Procedures (including critical care time)  Medications Ordered in ED Medications - No data to display   Initial Impression / Assessment and Plan / ED Course  I have reviewed the triage vital signs and the nursing notes.  Pertinent  labs & imaging results that were available during my care of the patient were reviewed by me and considered in my medical decision making (see chart for details).        Patient with a fall at home not preceded by syncope symptoms or dizziness or loss of consciousness.  Patient is awake alert and appropriate here.  She is complaining of right hip pain.  No significant deformity on exam and neurovascularly intact.  Hip fracture protocol initiated.  Patient given Tylenol for pain.  If x-rays are negative will do CT for occult fracture.  4:28 PM EKG is unchanged, chest x-ray is normal, hip films show an acute right subcapital femur fracture with mild impaction.  Spoke with Dr. Sharol Given as patient in the past has seen Dr. Ninfa Linden and would like to keep him as an orthopedist.  They will see the patient and early surgery will occur is tomorrow.  She needs to be made n.p.o. after midnight.  We will plan on admitting to the hospitalist service once labs return. Labs without acute findings.  Final Clinical Impressions(s) / ED Diagnoses   Final diagnoses:  Fall, initial encounter  Closed subcapital fracture of right femur, initial encounter [facility])    ED Discharge Orders    None       Blanchie Dessert, MD 08/04/19 1629    Blanchie Dessert, MD 08/04/19 1705

## 2019-08-04 NOTE — Consult Note (Signed)
Reason for Consult:  Right hip fracture Referring Physician: EDP/MD  Mackenzie Madden is an 85 y.o. female.  HPI: The patient is a 85 year old female who sustained a mechanical fall earlier today.  She was able to put some weight on her left hip but had difficulty doing so.  She was doing this with assistance.  With continued pain she was brought to the emergency room.  X-rays were obtained and she was found to have a nondisplaced impacted right hip femoral neck fracture.  Orthopedic surgery is consulted for evaluation treatment of this injury.  She is someone who I have seen before.  Family is with her at the bedside.  She has had a series of mechanical falls with unsteadiness on her feet over the last year.  She has had some pelvic fractures and vertebral body fractures in the past.  She does report right hip and groin pain and difficulty with ambulating on the right hip.  She is being admitted to the medicine service.  She denies any other injuries or acute changes in her medical status.  Past Medical History:  Diagnosis Date  . Glaucoma   . Hypertension   . Thyroid disease     Past Surgical History:  Procedure Laterality Date  . ABDOMINAL HYSTERECTOMY  2010  . CHOLECYSTECTOMY  2000  . SPLENECTOMY, TOTAL      Family History  Problem Relation Age of Onset  . CAD Mother   . Kidney cancer Mother   . Heart attack Father   . Colon cancer Neg Hx   . Esophageal cancer Neg Hx   . Stomach cancer Neg Hx   . Rectal cancer Neg Hx     Social History:  reports that she has never smoked. She has never used smokeless tobacco. She reports that she does not drink alcohol or use drugs.  Allergies: No Known Allergies  Medications: I have reviewed the patient's current medications.  Results for orders placed or performed during the hospital encounter of 08/04/19 (from the past 48 hour(s))  Basic metabolic panel     Status: Abnormal   Collection Time: 08/04/19  4:27 PM  Result Value Ref  Range   Sodium 136 135 - 145 mmol/L   Potassium 3.8 3.5 - 5.1 mmol/L   Chloride 101 98 - 111 mmol/L   CO2 27 22 - 32 mmol/L   Glucose, Bld 114 (H) 70 - 99 mg/dL   BUN 13 8 - 23 mg/dL   Creatinine, Ser 0.69 0.44 - 1.00 mg/dL   Calcium 9.6 8.9 - 10.3 mg/dL   GFR calc non Af Amer >60 >60 mL/min   GFR calc Af Amer >60 >60 mL/min   Anion gap 8 5 - 15    Comment: Performed at [facility], [place] [address] [place], [place] [address]  CBC WITH DIFFERENTIAL     Status: Abnormal   Collection Time: 08/04/19  4:27 PM  Result Value Ref Range   WBC 11.3 (H) 4.0 - 10.5 K/uL   RBC 4.76 3.87 - 5.11 MIL/uL   Hemoglobin 15.0 12.0 - 15.0 g/dL   HCT 45.9 36.0 - 46.0 %   MCV 96.4 80.0 - 100.0 fL   MCH 31.5 26.0 - 34.0 pg   MCHC 32.7 30.0 - 36.0 g/dL   RDW 14.1 11.5 - 15.5 %   Platelets 158 150 - 400 K/uL   nRBC 0.0 0.0 - 0.2 %   Neutrophils Relative % 75 %   Neutro Abs  8.5 (H) 1.7 - 7.7 K/uL   Lymphocytes Relative 15 %   Lymphs Abs 1.7 0.7 - 4.0 K/uL   Monocytes Relative 9 %   Monocytes Absolute 1.0 0.1 - 1.0 K/uL   Eosinophils Relative 1 %   Eosinophils Absolute 0.1 0.0 - 0.5 K/uL   Basophils Relative 0 %   Basophils Absolute 0.1 0.0 - 0.1 K/uL   Immature Granulocytes 0 %   Abs Immature Granulocytes 0.04 0.00 - 0.07 K/uL    Comment: Performed at [facility], [place] [address] [place], [place] [address]  Protime-INR     Status: None   Collection Time: 08/04/19  4:27 PM  Result Value Ref Range   Prothrombin Time 12.5 11.4 - 15.2 seconds   INR 0.9 0.8 - 1.2    Comment: (NOTE) INR goal varies based on device and disease states. Performed at [facility], [place] [address], [place], [place] [address]   Type and screen [place]     Status: None   Collection Time: 08/04/19  4:27 PM  Result Value Ref Range   ABO/RH(D) O POS    Antibody Screen NEG    Sample Expiration      08/07/2019,2359 Performed at [facility], [place] [address] [place], [place] [address]   SARS Coronavirus 2 by RT PCR (hospital order, performed in [facility] hospital lab) Nasopharyngeal Nasopharyngeal Swab     Status: None   Collection Time: 08/04/19  4:27 PM   Specimen: Nasopharyngeal Swab  Result Value Ref Range   SARS Coronavirus 2 NEGATIVE NEGATIVE    Comment: (NOTE) If result is NEGATIVE SARS-CoV-2 target nucleic acids are NOT DETECTED. The SARS-CoV-2 RNA is generally detectable in upper and lower  respiratory specimens during the acute phase of infection. The lowest  concentration of SARS-CoV-2 viral copies this assay can detect is 250  copies / mL. A negative result does not preclude SARS-CoV-2 infection  and should not be used as the sole basis for treatment or other  patient management decisions.  A negative result may occur with  improper specimen collection / handling, submission of specimen other  than nasopharyngeal swab, presence of viral mutation(s) within the  areas targeted by this assay, and inadequate number of viral copies  (<250 copies / mL). A negative result must be combined with clinical  observations, patient history, and epidemiological information. If result is POSITIVE SARS-CoV-2 target nucleic acids are DETECTED. The SARS-CoV-2 RNA is generally detectable in upper and lower  respiratory specimens dur ing the acute phase of infection.  Positive  results are indicative of active infection with SARS-CoV-2.  Clinical  correlation with patient history and other diagnostic information is  necessary to determine patient infection status.  Positive results do  not rule out bacterial infection or co-infection with other viruses. If result is PRESUMPTIVE POSTIVE SARS-CoV-2 nucleic acids MAY BE PRESENT.   A presumptive positive result was obtained on the submitted specimen  and confirmed on repeat testing.  While 2019 novel coronavirus  (SARS-CoV-2) nucleic acids may be present in the  submitted sample  additional confirmatory testing may be necessary for epidemiological  and / or clinical management purposes  to differentiate between  SARS-CoV-2 and other Sarbecovirus currently known to infect humans.  If clinically indicated additional testing with an alternate test  methodology [id number]) is advised. The SARS-CoV-2 RNA is generally  detectable in upper and lower respiratory sp ecimens during the acute  phase  of infection. The expected result is Negative. Fact Sheet for Patients:  StrictlyIdeas.no Fact Sheet for Healthcare Providers: BankingDealers.co.za This test is not yet approved or cleared by the [place] FDA and has been authorized for detection and/or diagnosis of SARS-CoV-2 by FDA under an Emergency Use Authorization (EUA).  This EUA will remain in effect (meaning this test can be used) for the duration of the COVID-19 declaration under Section 564(b)(1) of the Act, 21 U.S.C. section 360bbb-3(b)(1), unless the authorization is terminated or revoked sooner. Performed at [facility], [place] [address] [place], [place] [address]     Dg Chest 1 View  Result Date: 08/04/2019 CLINICAL DATA:  Right hip pain following a fall today. EXAM: CHEST  1 VIEW COMPARISON:  11/11/2018 FINDINGS: Stable enlarged cardiac silhouette and tortuous and calcified thoracic aorta. The lungs remain clear and hyperexpanded with mild diffuse peribronchial thickening. Old, healed 8th and 9th rib fractures on the right. No acute fracture or pneumothorax. IMPRESSION: 1. No acute abnormality. 2. Stable cardiomegaly and changes of COPD and chronic bronchitis. 3. Aortic atherosclerosis. Electronically Signed   By: Claudie Revering M.D.   On: 08/04/2019 15:53   Dg Hip Unilat With Pelvis 2-3 Views Right  Result Date: 08/04/2019 CLINICAL DATA:  Fall, hip pain EXAM: DG HIP (WITH OR WITHOUT PELVIS) 2-3V RIGHT COMPARISON:  None.  FINDINGS: There is anatomic alignment at the right hip joint. Acute fracture at base of femoral head with mild impaction. 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 IMPRESSION: Acute right subcapital femoral fracture with mild impaction. Electronically Signed   By: Macy Mis M.D.   On: 08/04/2019 15:54   The x-rays were reviewed and it does appear that she has a nondisplaced impacted right hip femoral neck fracture.  There is pre-existing arthritis in both hips.  ROS Blood pressure (!) 155/95, pulse 73, temperature 98.6 F (37 C), temperature source Oral, resp. rate 17, SpO2 97 %. Physical Exam  Constitutional: She is oriented to person, place, and time. She appears well-developed and well-nourished.  HENT:  Head: Normocephalic and atraumatic.  Eyes: Pupils are equal, round, and reactive to light.  Neck: Normal range of motion.  Cardiovascular: Normal rate.  Respiratory: Effort normal.  GI: Soft.  Musculoskeletal:     Right hip: She exhibits decreased range of motion, decreased strength, tenderness and bony tenderness.  Neurological: She is alert and oriented to person, place, and time.  Skin: Skin is warm and dry.  Psychiatric: She has a normal mood and affect.   Her leg lengths are near equal.  She can actually flex her right hip on her own and perform a straight leg raise although it is very painful for her to do so.  She has significant pain on rotation of her hip.   Assessment/Plan: Nondisplaced impacted right hip femoral neck fracture  I did talk to the patient and the family member at the bedside in length in detail.  Surprisingly I am able to move her hip around with certainly pain that is significant but she is able to actually tolerate this somewhat.  Her leg lengths are near equal.  Given this exam I do feel it would be more appropriate to try fixation of this fracture with cannulated screws there is small incision as opposed to hip replacement.  I  explained this in detail to the family.  They understand that this still may end up causing osteonecrosis of the femoral head which would then require hip replacement.  However, there is a chance that this may do well for her given the fact that she did walk a few steps with this broken hip after she fell.  We will let her eat this evening.  My plan is to put her on the operating room schedule for late tomorrow afternoon for screw fixation of her right hip.  All question concerns were answered and addressed.  Mcarthur Rossetti 08/04/2019, 7:26 PM

## 2019-08-04 NOTE — ED Notes (Addendum)
After pushing IV fentanyl slow, pt desat to 86% room air. Put patient on 2L [place], O2 is now 97%.

## 2019-08-04 NOTE — ED Notes (Signed)
Called RN to get report, asked to wait until 1930 and call back.

## 2019-08-04 NOTE — H&P (Signed)
History and Physical    Mackenzie Madden [id number] DOB: July 14, 1928 DOA: 08/04/2019  PCP: Lavone Orn, MD  Patient coming from: Home  I have personally briefly reviewed patient's old medical records in [place]  Chief Complaint: Fall/right hip pain  HPI: Mackenzie Madden is a 85 y.o. female with medical history significant of hypertension, glaucoma, hypothyroidism, history of pubic ramus fracture, who presents to the ED with right hip pain after a fall.  Patient stated was following her husband when her feet may have gotten tangled up and she lost her balance and fell on her right side with significant right hip pain.  Patient stated unable to walk and her husband and daughter had to get her off the floor.  EMS was called and patient brought to the ED.  Patient denied any dizziness, no syncopal or presyncopal episodes, no fever, no chills, no nausea, no vomiting, no abdominal pain, no shortness of breath, no chest pain, no diarrhea, no constipation, no melena, no hematemesis, no hematochezia, no asymmetric weakness or numbness.  ED Course: Patient seen in the ED, basic metabolic profile is unremarkable.  CBC with a white count of 11.3 otherwise was unremarkable.  Chest x-ray done with no acute abnormality, stable cardiomegaly and changes of COPD and chronic bronchitis.  Aortic atherosclerosis.  Plain films of the right hip and pelvis with a acute right subcapital femoral fracture with mild impaction.  EKG with normal sinus rhythm with left bundle branch block which is old.  Review of Systems: As per HPI otherwise 10 point review of systems negative.   Past Medical History:  Diagnosis Date  . Glaucoma   . Hypertension   . Thyroid disease     Past Surgical History:  Procedure Laterality Date  . ABDOMINAL HYSTERECTOMY  2010  . CHOLECYSTECTOMY  2000  . SPLENECTOMY, TOTAL       reports that she has never smoked. She has never used smokeless tobacco. She reports that  she does not drink alcohol or use drugs.  No Known Allergies  Family History  Problem Relation Age of Onset  . CAD Mother   . Kidney cancer Mother   . Heart attack Father   . Colon cancer Neg Hx   . Esophageal cancer Neg Hx   . Stomach cancer Neg Hx   . Rectal cancer Neg Hx    Mother deceased age 81 from kidney disease.  Father deceased age 15 from coronary artery disease.  Prior to Admission medications   Medication Sig Start Date End Date Taking? Authorizing Provider  brimonidine-timolol (COMBIGAN) 0.2-0.5 % ophthalmic solution Place 1 drop into the left eye every 12 (twelve) hours.    Yes [provider]  Calcium 250 MG CAPS Take 250 mg by mouth daily.   Yes [provider]  latanoprost (XALATAN) 0.005 % ophthalmic solution Place 1 drop into both eyes at bedtime. 01/11/15  Yes [provider]  levothyroxine (SYNTHROID) 88 MCG tablet Take 88 mcg by mouth daily before breakfast.    Yes [provider]  Multiple Vitamin (MULTIVITAMIN WITH MINERALS) TABS tablet Take 1 tablet by mouth daily.   Yes [provider]  naproxen sodium (ANAPROX) 220 MG tablet Take 220 mg by mouth daily as needed (pain). ALEVE   Yes [provider]  valsartan (DIOVAN) 40 MG tablet Take 40 mg by mouth daily as needed. If bp over 150 to take 40mg . 07/31/19  Yes [provider]  omeprazole (PRILOSEC) 20 MG capsule  Take 1 capsule (20 mg total) by mouth daily before breakfast. 30 minutes before Patient not taking: Reported on 01/16/2019 11/15/18   Gatha Mayer, MD  senna-docusate (SENOKOT-S) 8.6-50 MG tablet Take 1 tablet by mouth at bedtime. Patient not taking: Reported on 01/16/2019 06/07/16   Florencia Reasons, MD    Physical Exam: Vitals:   08/04/19 1428 08/04/19 1630 08/04/19 1700 08/04/19 1718  BP: (!) 172/97 (!) 173/95 (!) 172/84 (!) 152/91  Pulse: 78 98 90 81  Resp: (!) 21  (!) 22 14  Temp: 98.6 F (37 C)     TempSrc: Oral     SpO2: 94% 95% 91% 96%     Constitutional: NAD, calm, comfortable Vitals:   08/04/19 1428 08/04/19 1630 08/04/19 1700 08/04/19 1718  BP: (!) 172/97 (!) 173/95 (!) 172/84 (!) 152/91  Pulse: 78 98 90 81  Resp: (!) 21  (!) 22 14  Temp: 98.6 F (37 C)     TempSrc: Oral     SpO2: 94% 95% 91% 96%   Eyes: PERRLA, lids and conjunctivae normal ENMT: Mucous membranes are moist. Posterior pharynx clear of any exudate or lesions.Normal dentition.  Neck: normal, supple, no masses, no thyromegaly Respiratory: clear to auscultation bilaterally, no wheezing, no crackles. Normal respiratory effort. No accessory muscle use.  Cardiovascular: Regular rate and rhythm, no murmurs / rubs / gallops. No extremity edema. 2+ pedal pulses. No carotid bruits.  Abdomen: no tenderness, no masses palpated. No hepatosplenomegaly. Bowel sounds positive.  Musculoskeletal: no clubbing / cyanosis. No joint deformity upper and lower extremities. Good ROM, no contractures. Normal muscle tone.  Skin: no rashes, lesions, ulcers. No induration Neurologic: CN 2-12 grossly intact. Sensation intact, DTR normal. Strength 5/5 in all 4.  Psychiatric: Normal judgment and insight. Alert and oriented x 3. Normal mood.  Ext: Right lower extremity externally rotated and slightly shortened.  Labs on Admission: I have personally reviewed following labs and imaging studies  CBC: Recent Labs  Lab 08/04/19 1627  WBC 11.3*  NEUTROABS 8.5*  HGB 15.0  HCT 45.9  MCV 96.4  PLT 0000000   Basic Metabolic Panel: Recent Labs  Lab 08/04/19 1627  NA 136  K 3.8  CL 101  CO2 27  GLUCOSE 114*  BUN 13  CREATININE 0.69  CALCIUM 9.6   GFR: CrCl cannot be calculated (Unknown ideal weight.). Liver Function Tests: No results for input(s): AST, ALT, ALKPHOS, BILITOT, PROT, ALBUMIN in the last 168 hours. No results for input(s): LIPASE, AMYLASE in the last 168 hours. No results for input(s): AMMONIA in the last 168 hours. Coagulation Profile: Recent Labs  Lab  08/04/19 1627  INR 0.9   Cardiac Enzymes: No results for input(s): CKTOTAL, CKMB, CKMBINDEX, TROPONINI in the last 168 hours. BNP (last 3 results) No results for input(s): PROBNP in the last 8760 hours. HbA1C: No results for input(s): HGBA1C in the last 72 hours. CBG: No results for input(s): GLUCAP in the last 168 hours. Lipid Profile: No results for input(s): CHOL, HDL, LDLCALC, TRIG, CHOLHDL, LDLDIRECT in the last 72 hours. Thyroid Function Tests: No results for input(s): TSH, T4TOTAL, FREET4, T3FREE, THYROIDAB in the last 72 hours. Anemia Panel: No results for input(s): VITAMINB12, FOLATE, FERRITIN, TIBC, IRON, RETICCTPCT in the last 72 hours. Urine analysis:    Component Value Date/Time   COLORURINE YELLOW 01/16/2019 1227   APPEARANCEUR CLEAR 01/16/2019 1227   LABSPEC 1.008 01/16/2019 1227   PHURINE 7.0 01/16/2019 1227   GLUCOSEU NEGATIVE 01/16/2019 1227  HGBUR NEGATIVE 01/16/2019 [place] 01/16/2019 [place] 01/16/2019 1227   PROTEINUR NEGATIVE 01/16/2019 1227   UROBILINOGEN 0.2 02/21/2015 1558   NITRITE NEGATIVE 01/16/2019 1227   LEUKOCYTESUR NEGATIVE 01/16/2019 1227    Radiological Exams on Admission: Dg Chest 1 View  Result Date: 08/04/2019 CLINICAL DATA:  Right hip pain following a fall today. EXAM: CHEST  1 VIEW COMPARISON:  11/11/2018 FINDINGS: Stable enlarged cardiac silhouette and tortuous and calcified thoracic aorta. The lungs remain clear and hyperexpanded with mild diffuse peribronchial thickening. Old, healed 8th and 9th rib fractures on the right. No acute fracture or pneumothorax. IMPRESSION: 1. No acute abnormality. 2. Stable cardiomegaly and changes of COPD and chronic bronchitis. 3. Aortic atherosclerosis. Electronically Signed   By: Claudie Revering M.D.   On: 08/04/2019 15:53   Dg Hip Unilat With Pelvis 2-3 Views Right  Result Date: 08/04/2019 CLINICAL DATA:  Fall, hip pain EXAM: DG HIP (WITH OR WITHOUT PELVIS) 2-3V  RIGHT COMPARISON:  None. FINDINGS: There is anatomic alignment at the right hip joint. Acute fracture at base of femoral head with mild impaction. 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 IMPRESSION: Acute right subcapital femoral fracture with mild impaction. Electronically Signed   By: Macy Mis M.D.   On: 08/04/2019 15:54    EKG: Independently reviewed.  Normal sinus rhythm with left bundle branch block old  Assessment/Plan Principal Problem:   Closed right femoral fracture (HCC) Active Problems:   Hip fracture (HCC)   HTN (hypertension)   Hypothyroid   Chronic diastolic heart failure (HCC)   Leukocytosis   1 acute right subcapital femoral fracture with mild impaction Secondary to mechanical fall.  Patient denies any presyncopal or syncopal episodes.  No recent history of chest pain or shortness of breath.  Patient denies any recent acute MIs.  EKG with left bundle branch block which is old.  The ED physician orthopedics were consulted and patient likely for surgery tomorrow and will be seen by Dr. Ninfa Linden tomorrow.  We will keep patient n.p.o. after midnight in anticipation of possible surgery.  Follow.  2.  Hypothyroidism Continue home dose Synthroid.  3.  Chronic diastolic heart failure Patient denies any history of heart failure.  Patient currently euvolemic.  Follow.  4.  Leukocytosis Likely reactive leukocytosis.  Check a UA with cultures and sensitivities.  Chest x-ray negative for any acute infiltrate.  No need for antibiotics at this time.  Follow.  5.  Hypertension Is on Avapro.  Will place on low-dose beta-blocker of Coreg perioperatively.  6.  Glaucoma Continue home eyedrops of xalatan and Combigan.  DVT prophylaxis: SCDs Code Status: Full Family Communication: Updated patient and daughter at bedside. Disposition Plan: To be determined Consults called: Orthopedics: Dr. Sharol Given Admission status: Admit to inpatient/MedSurg.   Irine Seal  MD [facility]  If 7PM-7AM, please contact night-coverage [email or web address]  08/04/2019, 6:14 PM

## 2019-08-05 ENCOUNTER — Encounter (HOSPITAL_COMMUNITY)
Admission: EM | Disposition: A | Discharge status: Home Health | Payer: Self-pay | Source: Home / Self Care | Attending: Internal Medicine

## 2019-08-05 ENCOUNTER — Other Ambulatory Visit: Payer: Self-pay

## 2019-08-05 ENCOUNTER — Inpatient Hospital Stay (HOSPITAL_COMMUNITY): Payer: Medicare Other

## 2019-08-05 ENCOUNTER — Encounter (HOSPITAL_COMMUNITY): Payer: Self-pay | Admitting: *Deleted

## 2019-08-05 ENCOUNTER — Inpatient Hospital Stay (HOSPITAL_COMMUNITY): Payer: Medicare Other | Admitting: Certified Registered Nurse Anesthetist

## 2019-08-05 HISTORY — PX: HIP PINNING,CANNULATED: SHX1758

## 2019-08-05 LAB — URINALYSIS, COMPLETE (UACMP) WITH MICROSCOPIC
Bilirubin Urine: NEGATIVE
Glucose, UA: NEGATIVE mg/dL
Ketones, ur: NEGATIVE mg/dL
Nitrite: NEGATIVE
Protein, ur: NEGATIVE mg/dL
Specific Gravity, Urine: 1.01 (ref 1.005–1.030)
pH: 7 (ref 5.0–8.0)

## 2019-08-05 LAB — CBC
HCT: 41.5 % (ref 36.0–46.0)
Hemoglobin: 13.6 g/dL (ref 12.0–15.0)
MCH: 32.2 pg (ref 26.0–34.0)
MCHC: 32.8 g/dL (ref 30.0–36.0)
MCV: 98.1 fL (ref 80.0–100.0)
Platelets: 141 10*3/uL — ABNORMAL LOW (ref 150–400)
RBC: 4.23 MIL/uL (ref 3.87–5.11)
RDW: 14.3 % (ref 11.5–15.5)
WBC: 10.5 10*3/uL (ref 4.0–10.5)
nRBC: 0 % (ref 0.0–0.2)

## 2019-08-05 LAB — BASIC METABOLIC PANEL
Anion gap: 7 (ref 5–15)
BUN: 13 mg/dL (ref 8–23)
CO2: 26 mmol/L (ref 22–32)
Calcium: 9.1 mg/dL (ref 8.9–10.3)
Chloride: 103 mmol/L (ref 98–111)
Creatinine, Ser: 0.77 mg/dL (ref 0.44–1.00)
GFR calc Af Amer: >60 mL/min (ref >60)
GFR calc non Af Amer: >60 mL/min (ref >60)
Glucose, Bld: 135 mg/dL — ABNORMAL HIGH (ref 70–99)
Potassium: 4 mmol/L (ref 3.5–5.1)
Sodium: 136 mmol/L (ref 135–145)

## 2019-08-05 LAB — PROTIME-INR
INR: 1.1 (ref 0.8–1.2)
Prothrombin Time: 14.2 seconds (ref 11.4–15.2)

## 2019-08-05 LAB — SURGICAL PCR SCREEN
MRSA, PCR: NEGATIVE
Staphylococcus aureus: NEGATIVE

## 2019-08-05 LAB — GLUCOSE, CAPILLARY: Glucose-Capillary: 167 mg/dL — ABNORMAL HIGH (ref 70–99)

## 2019-08-05 LAB — VITAMIN D 25 HYDROXY (VIT D DEFICIENCY, FRACTURES): Vit D, 25-Hydroxy: 67.34 ng/mL (ref 30–100)

## 2019-08-05 SURGERY — FIXATION, FEMUR, NECK, PERCUTANEOUS, USING SCREW
Anesthesia: General | Site: Hip | Laterality: Right

## 2019-08-05 MED ORDER — PHENYLEPHRINE HCL-NACL 10-0.9 MG/250ML-% IV SOLN
INTRAVENOUS | Status: AC
  Filled 2019-08-05: qty 250

## 2019-08-05 MED ORDER — ALUM & MAG HYDROXIDE-SIMETH 200-200-20 MG/5ML PO SUSP: 30.0000 mL | Freq: Four times a day (QID) | ORAL | Status: DC | PRN

## 2019-08-05 MED ORDER — LACTATED RINGERS IV SOLN
INTRAVENOUS | Status: DC
  Administered 2019-08-05 (×2): via INTRAVENOUS

## 2019-08-05 MED ORDER — DEXAMETHASONE SODIUM PHOSPHATE 10 MG/ML IJ SOLN
INTRAMUSCULAR | Status: AC
  Filled 2019-08-05: qty 1

## 2019-08-05 MED ORDER — LIDOCAINE 2% (20 MG/ML) 5 ML SYRINGE
INTRAMUSCULAR | Status: AC
  Filled 2019-08-05: qty 5

## 2019-08-05 MED ORDER — ASPIRIN EC 325 MG PO TBEC
325.0000 mg | DELAYED_RELEASE_TABLET | Freq: Every day | ORAL | Status: DC
  Administered 2019-08-06 – 2019-08-07 (×2): 325 mg via ORAL
  Filled 2019-08-05 (×2): qty 1

## 2019-08-05 MED ORDER — FENTANYL CITRATE (PF) 100 MCG/2ML IJ SOLN
INTRAMUSCULAR | Status: AC
  Filled 2019-08-05: qty 2

## 2019-08-05 MED ORDER — ONDANSETRON HCL 4 MG/2ML IJ SOLN
INTRAMUSCULAR | Status: AC
  Filled 2019-08-05: qty 2

## 2019-08-05 MED ORDER — MENTHOL 3 MG MT LOZG: 1.0000 | LOZENGE | OROMUCOSAL | Status: DC | PRN

## 2019-08-05 MED ORDER — ALBUMIN HUMAN 5 % IV SOLN
INTRAVENOUS | Status: AC
  Filled 2019-08-05: qty 250

## 2019-08-05 MED ORDER — HYDROCODONE-ACETAMINOPHEN 5-325 MG PO TABS
1.0000 | ORAL_TABLET | ORAL | Status: DC | PRN
  Administered 2019-08-06: 10:00:00 1 via ORAL
  Filled 2019-08-05: qty 1

## 2019-08-05 MED ORDER — ENSURE ENLIVE PO LIQD
237.0000 mL | Freq: Two times a day (BID) | ORAL | Status: DC
  Administered 2019-08-07: 10:00:00 237 mL via ORAL

## 2019-08-05 MED ORDER — ONDANSETRON HCL 4 MG/2ML IJ SOLN
INTRAMUSCULAR | Status: DC | PRN
  Administered 2019-08-05: 4 mg via INTRAVENOUS

## 2019-08-05 MED ORDER — PHENOL 1.4 % MT LIQD: 1.0000 | OROMUCOSAL | Status: DC | PRN

## 2019-08-05 MED ORDER — EPHEDRINE SULFATE-NACL 50-0.9 MG/10ML-% IV SOSY
PREFILLED_SYRINGE | INTRAVENOUS | Status: DC | PRN
  Administered 2019-08-05 (×2): 10 mg via INTRAVENOUS

## 2019-08-05 MED ORDER — CHLORHEXIDINE GLUCONATE CLOTH 2 % EX PADS
6.0000 | MEDICATED_PAD | Freq: Once | CUTANEOUS | Status: AC
  Administered 2019-08-05: 13:00:00 6 via TOPICAL

## 2019-08-05 MED ORDER — METOCLOPRAMIDE HCL 5 MG PO TABS: 5.0000 mg | ORAL_TABLET | Freq: Three times a day (TID) | ORAL | Status: DC | PRN

## 2019-08-05 MED ORDER — DOCUSATE SODIUM 100 MG PO CAPS
100.0000 mg | ORAL_CAPSULE | Freq: Two times a day (BID) | ORAL | Status: DC
  Administered 2019-08-05 – 2019-08-07 (×4): 100 mg via ORAL
  Filled 2019-08-05 (×4): qty 1

## 2019-08-05 MED ORDER — ONDANSETRON HCL 4 MG/2ML IJ SOLN: 4.0000 mg | Freq: Four times a day (QID) | INTRAMUSCULAR | Status: DC | PRN

## 2019-08-05 MED ORDER — FENTANYL CITRATE (PF) 100 MCG/2ML IJ SOLN
25.0000 ug | INTRAMUSCULAR | Status: DC | PRN
  Administered 2019-08-05: 50 ug via INTRAVENOUS

## 2019-08-05 MED ORDER — HYDROCODONE-ACETAMINOPHEN 7.5-325 MG PO TABS
1.0000 | ORAL_TABLET | ORAL | Status: DC | PRN
  Administered 2019-08-05: 23:00:00 1 via ORAL
  Filled 2019-08-05: qty 1

## 2019-08-05 MED ORDER — ONDANSETRON HCL 4 MG PO TABS: 4.0000 mg | ORAL_TABLET | Freq: Four times a day (QID) | ORAL | Status: DC | PRN

## 2019-08-05 MED ORDER — SODIUM CHLORIDE 0.9 % IV SOLN
1.0000 g | INTRAVENOUS | Status: DC
  Administered 2019-08-05 – 2019-08-06 (×2): 1 g via INTRAVENOUS
  Filled 2019-08-05: qty 1
  Filled 2019-08-05: qty 10
  Filled 2019-08-05: qty 1

## 2019-08-05 MED ORDER — PHENYLEPHRINE 40 MCG/ML (10ML) SYRINGE FOR IV PUSH (FOR BLOOD PRESSURE SUPPORT)
PREFILLED_SYRINGE | INTRAVENOUS | Status: DC | PRN
  Administered 2019-08-05: 80 ug via INTRAVENOUS
  Administered 2019-08-05 (×2): 120 ug via INTRAVENOUS

## 2019-08-05 MED ORDER — PROPOFOL 10 MG/ML IV BOLUS
INTRAVENOUS | Status: AC
  Filled 2019-08-05: qty 20

## 2019-08-05 MED ORDER — CEFAZOLIN SODIUM-DEXTROSE 2-4 GM/100ML-% IV SOLN
INTRAVENOUS | Status: AC
  Filled 2019-08-05: qty 100

## 2019-08-05 MED ORDER — ALBUMIN HUMAN 5 % IV SOLN
INTRAVENOUS | Status: DC | PRN
  Administered 2019-08-05: 18:00:00 via INTRAVENOUS

## 2019-08-05 MED ORDER — SUGAMMADEX SODIUM 200 MG/2ML IV SOLN
INTRAVENOUS | Status: DC | PRN
  Administered 2019-08-05: 150 mg via INTRAVENOUS

## 2019-08-05 MED ORDER — CEFAZOLIN SODIUM-DEXTROSE 2-4 GM/100ML-% IV SOLN
2.0000 g | Freq: Once | INTRAVENOUS | Status: AC
  Administered 2019-08-05: 17:00:00 2 g via INTRAVENOUS

## 2019-08-05 MED ORDER — MORPHINE SULFATE (PF) 2 MG/ML IV SOLN: 0.5000 mg | INTRAVENOUS | Status: DC | PRN

## 2019-08-05 MED ORDER — ROCURONIUM BROMIDE 50 MG/5ML IV SOSY
PREFILLED_SYRINGE | INTRAVENOUS | Status: DC | PRN
  Administered 2019-08-05: 50 mg via INTRAVENOUS

## 2019-08-05 MED ORDER — ROCURONIUM BROMIDE 10 MG/ML (PF) SYRINGE
PREFILLED_SYRINGE | INTRAVENOUS | Status: AC
  Filled 2019-08-05: qty 10

## 2019-08-05 MED ORDER — LIDOCAINE 2% (20 MG/ML) 5 ML SYRINGE
INTRAMUSCULAR | Status: DC | PRN
  Administered 2019-08-05: 40 mg via INTRAVENOUS

## 2019-08-05 MED ORDER — ACETAMINOPHEN 325 MG PO TABS: 325.0000 mg | ORAL_TABLET | Freq: Four times a day (QID) | ORAL | Status: DC | PRN

## 2019-08-05 MED ORDER — PROPOFOL 10 MG/ML IV BOLUS
INTRAVENOUS | Status: DC | PRN
  Administered 2019-08-05: 70 mg via INTRAVENOUS

## 2019-08-05 MED ORDER — METOCLOPRAMIDE HCL 5 MG/ML IJ SOLN: 5.0000 mg | Freq: Three times a day (TID) | INTRAMUSCULAR | Status: DC | PRN

## 2019-08-05 MED ORDER — FENTANYL CITRATE (PF) 100 MCG/2ML IJ SOLN: 25.0000 ug | INTRAMUSCULAR | Status: DC | PRN

## 2019-08-05 MED ORDER — CALCIUM CARBONATE ANTACID 500 MG PO CHEW
500.0000 mg | CHEWABLE_TABLET | Freq: Every day | ORAL | Status: DC
  Administered 2019-08-05 – 2019-08-07 (×3): 500 mg via ORAL
  Filled 2019-08-05 (×3): qty 3

## 2019-08-05 MED ORDER — FENTANYL CITRATE (PF) 100 MCG/2ML IJ SOLN
INTRAMUSCULAR | Status: DC | PRN
  Administered 2019-08-05 (×5): 25 ug via INTRAVENOUS

## 2019-08-05 MED ORDER — CHLORHEXIDINE GLUCONATE CLOTH 2 % EX PADS
6.0000 | MEDICATED_PAD | Freq: Once | CUTANEOUS | Status: AC
  Administered 2019-08-05: 06:00:00 6 via TOPICAL

## 2019-08-05 MED ORDER — 0.9 % SODIUM CHLORIDE (POUR BTL) OPTIME
TOPICAL | Status: DC | PRN
  Administered 2019-08-05: 18:00:00 1000 mL

## 2019-08-05 SURGICAL SUPPLY — 44 items
BAG ZIPLOCK 12X15 (MISCELLANEOUS) ×3 IMPLANT
BIT DRILL 4.8X300 (BIT) ×2 IMPLANT
BIT DRILL 4.8X300MM (BIT) ×1
BNDG COHESIVE 4X5 TAN STRL (GAUZE/BANDAGES/DRESSINGS) ×3 IMPLANT
BNDG GAUZE ELAST 4 BULKY (GAUZE/BANDAGES/DRESSINGS) ×6 IMPLANT
COVER SURGICAL LIGHT HANDLE (MISCELLANEOUS) ×3 IMPLANT
COVER WAND RF STERILE (DRAPES) IMPLANT
DRAPE STERI IOBAN 125X83 (DRAPES) ×3 IMPLANT
DRSG AQUACEL AG ADV 3.5X 4 (GAUZE/BANDAGES/DRESSINGS) ×3 IMPLANT
DRSG PAD ABDOMINAL 8X10 ST (GAUZE/BANDAGES/DRESSINGS) ×3 IMPLANT
DRSG TEGADERM 4X4.75 (GAUZE/BANDAGES/DRESSINGS) IMPLANT
DURAPREP 26ML APPLICATOR (WOUND CARE) ×3 IMPLANT
ELECT REM PT RETURN 15FT ADLT (MISCELLANEOUS) ×3 IMPLANT
FACESHIELD WRAPAROUND (MASK) IMPLANT
GAUZE SPONGE 4X4 12PLY STRL (GAUZE/BANDAGES/DRESSINGS) ×3 IMPLANT
GAUZE XEROFORM 1X8 LF (GAUZE/BANDAGES/DRESSINGS) ×3 IMPLANT
GAUZE XEROFORM 5X9 LF (GAUZE/BANDAGES/DRESSINGS) ×3 IMPLANT
GLOVE BIO SURGEON STRL SZ7.5 (GLOVE) ×3 IMPLANT
GLOVE BIOGEL PI IND STRL 8 (GLOVE) ×1 IMPLANT
GLOVE BIOGEL PI INDICATOR 8 (GLOVE) ×2
GLOVE ECLIPSE 8.0 STRL XLNG CF (GLOVE) ×3 IMPLANT
GOWN STRL REUS W/TWL XL LVL3 (GOWN DISPOSABLE) ×3 IMPLANT
KIT TURNOVER KIT A (KITS) IMPLANT
NS IRRIG 1000ML POUR BTL (IV SOLUTION) ×3 IMPLANT
PACK GENERAL/GYN (CUSTOM PROCEDURE TRAY) ×3 IMPLANT
PAD CAST 4YDX4 CTTN HI CHSV (CAST SUPPLIES) ×1 IMPLANT
PADDING CAST COTTON 4X4 STRL (CAST SUPPLIES) ×2
PIN GUIDE DRILL TIP 2.8X300 (DRILL) ×9 IMPLANT
PROTECTOR NERVE ULNAR (MISCELLANEOUS) ×3 IMPLANT
SCREW CANN 6.5X90 (Screw) ×1 IMPLANT
SCREW CANN 6.5X90 16MM THD (Screw) ×1 IMPLANT
SCREW CANN THRD 6.5X80 (Screw) ×6 IMPLANT
SCREW CANNULATED 6.5X90MM (Screw) ×2 IMPLANT
SPONGE LAP 18X18 RF (DISPOSABLE) IMPLANT
STAPLER VISISTAT 35W (STAPLE) ×3 IMPLANT
SUT VIC AB 0 CT1 27 (SUTURE) ×2
SUT VIC AB 0 CT1 27XBRD ANTBC (SUTURE) ×1 IMPLANT
SUT VIC AB 1 CT1 27 (SUTURE) ×4
SUT VIC AB 1 CT1 27XBRD ANTBC (SUTURE) ×2 IMPLANT
SUT VIC AB 2-0 CT1 27 (SUTURE) ×2
SUT VIC AB 2-0 CT1 TAPERPNT 27 (SUTURE) ×1 IMPLANT
TOWEL OR 17X26 10 PK STRL BLUE (TOWEL DISPOSABLE) ×6 IMPLANT
TRAY FOLEY MTR SLVR 16FR STAT (SET/KITS/TRAYS/PACK) IMPLANT
WATER STERILE IRR 1000ML POUR (IV SOLUTION) ×3 IMPLANT

## 2019-08-05 NOTE — Op Note (Signed)
NAME: ARTI, KOSE MEDICAL RECORD [id number] ACCOUNT [id number] DATE OF BIRTH:11-14-1927 FACILITY: [facility] LOCATION: [facility] PHYSICIAN:CHRISTOPHER Kerry Fort, MD  OPERATIVE REPORT  DATE OF PROCEDURE:  08/05/2019  PREOPERATIVE DIAGNOSIS:  Right hip nondisplaced femoral neck fracture.  POSTOPERATIVE DIAGNOSIS:  Right hip nondisplaced femoral neck fracture.  PROCEDURE:  Cannulated screw fixation, right hip femoral neck fracture.  IMPLANTS:  Biomet-Zimmer 6.5 mm cannulated screws x3.  SURGEON:  Jonn Shingles, MD  ASSISTANT:  Erskine Emery, PA-C  ANESTHESIA:  General.  ANTIBIOTICS:  2 g IV Ancef.  ESTIMATED BLOOD LOSS:  Less  than 100 mL.  COMPLICATIONS:  None.  INDICATIONS:  The patient is a very pleasant 85 year old female well known to me.  She has had a series of mechanical falls over this last year.  She fell again yesterday, injuring her right hip.  She was surprisingly able to ambulate some on that hip,  but then had severe enough pain that she was brought to the emergency room.  She was found to have a nondisplaced femoral neck fracture.  She does have some hip arthritis in that right hip, but surprisingly can move her hip around well and I felt that it  would benefit the patient to try to attempt fixation would just cannulated screws as opposed to a hemiarthroplasty versus a total hip arthroplasty.  I explained the rationale behind this as well as the risks and benefits involved including the main  risks of osteonecrosis of the femoral head and a nonunion of the fracture.  There is also risk of infection and acute blood loss anemia.  The risks and benefits were discussed with her and her family in detail and they did wish to proceed with surgery.  DESCRIPTION OF PROCEDURE:  After informed consent was obtained, the appropriate right hip was marked.  She was brought to the operating room where general anesthesia was obtained while she was on her stretcher.   She was then placed supine on the [place]  fracture table, the perineal post in place and traction boot on the right foot, but no traction applied and she was then in line skeletal traction on the left foot, but again no traction.  Her left nonoperative leg was placed in a well leg holder with  appropriate padding of the down nonoperative extremity.  We then assessed her fracture under direct fluoroscopy and it remained an impacted nondisplaced femoral neck fracture and I put it through internal and external rotation under direct fluoroscopy  and it moved as a unit.  We then prepped her right hip with DuraPrep and sterile drapes.  A time-out was called.  She was identified as correct patient, correct right hip.  I then made an incision along the lateral thigh and dissected down to the lateral  cortex of the femur.  We then placed 3 temporary guide pins in an inverted format configuration into the femoral head.  We then took measurements off of these and then placed 6.5 mm cannulated screws without difficulty.  There was actually decent  purchase of the screws into the femoral head.  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  We then irrigated the deep tissue with normal saline solution using pulsatile lavage.  The deep tissue was closed with 0 Vicryl, followed by 2-0 Vicryl to close the  subcutaneous tissue and interrupted staples on the skin.  Xeroform and Aquacel dressing was applied.  She was taken off of the fracture table, awakened, extubated, and taken to the recovery room in  stable condition.  All final counts were correct.  There  were no complications noted.  Given the soft nature of her bone, postoperatively I am going to have her nonweightbearing on that right hip for a minimum of 4 weeks.  Of note Erskine Emery, PA-C assistance was crucial during the entire case.  VN/NUANCE  D:08/05/2019 T:08/05/2019 JOB:[id number]

## 2019-08-05 NOTE — Plan of Care (Signed)
  Problem: Education: Goal: Knowledge of General Education information will improve Description: Including pain rating scale, medication(s)/side effects and non-pharmacologic comfort measures Outcome: Progressing   Problem: Clinical Measurements: Goal: Respiratory complications will improve Outcome: Progressing Goal: Cardiovascular complication will be avoided Outcome: Progressing   Problem: Safety: Goal: Ability to remain free from injury will improve Outcome: Progressing

## 2019-08-05 NOTE — Progress Notes (Signed)
Initial Nutrition Assessment  DOCUMENTATION CODES:   Not applicable  INTERVENTION:   - Please obtain admission weight  - Continue MVI with minerals daily  - Once diet advanced, Ensure Enlive po BID, each supplement provides 350 kcal and 20 grams of protein  NUTRITION DIAGNOSIS:   Inadequate oral intake related to inability to eat as evidenced by NPO status.  GOAL:   Patient will meet greater than or equal to 90% of their needs  MONITOR:   Supplement acceptance, Diet advancement, Labs, Weight trends  REASON FOR ASSESSMENT:   Consult Hip fracture protocol  ASSESSMENT:   85 year old female who presented to the ED on 11/02 after a fall. PMH of HTN. Pt found to have acute right subcapital femur fracture with mild impaction.   Noted plan for fixation of right hip fracture later this afternoon. Pt is NPO for procedure.  Spoke with pt's daughter at bedside as pt was sleeping soundly. Daughter preferred pt not be woken up due to not sleeping much last night.  Pt's daughter reports that pt has a very good appetite and eats 3 meals with snacks daily. Pt's daughter shares that pt drinks 1/2-1 bottle of Ensure daily.  Breakfast: eggs and sausage Lunch: sandwich or burger Dinner: fish sticks and fries or pizza Snacks: popcorn  Pt's daughter reports pt has lost some weight recently but is unsure of how much. No admission weight available. Pt's daughter states that pt's most recent UBW is around 100 lbs.  Pt's daughter amenable to RD ordering Ensure Enlive once pt's diet advanced. Discussed the importance of adequate nutrition in post-op healing.  Medications reviewed and include: calcium carbonate, MVI with minerals, Protonix, Senna, IV abx IVF: NS @ 75 ml/hr  Labs reviewed. CBG's: 167  NUTRITION - FOCUSED PHYSICAL EXAM:  Deferred per pt's daughter. Pt sleeping soundly.  Diet Order:   Diet Order            Diet NPO time specified Except for: Sips with Meds  Diet  effective midnight              EDUCATION NEEDS:   Education needs have been addressed  Skin:  Skin Assessment: Reviewed RN Assessment  Last BM:  no documented BM  Height:   Ht Readings from Last 1 Encounters:  01/16/19 5' (1.524 m)    Weight:   Wt Readings from Last 1 Encounters:  01/16/19 41.7 kg    Ideal Body Weight:  45.5 kg  BMI:  There is no height or weight on file to calculate BMI.  Estimated Nutritional Needs:   Kcal:  1100-1300  Protein:  50-65 grams  Fluid:  >/= 1.2 L    Gaynell Face, MS, RD, LDN Inpatient Clinical Dietitian Pager: [phone] Weekend/After Hours: [phone]

## 2019-08-05 NOTE — Plan of Care (Signed)
  Problem: Clinical Measurements: Goal: Respiratory complications will improve Outcome: Progressing   Problem: Coping: Goal: Level of anxiety will decrease Outcome: Progressing   Problem: Nutrition: Goal: Adequate nutrition will be maintained Outcome: Progressing   Problem: Pain Managment: Goal: General experience of comfort will improve Outcome: Progressing   Problem: Safety: Goal: Ability to remain free from injury will improve Outcome: Progressing   Problem: Skin Integrity: Goal: Risk for impaired skin integrity will decrease Outcome: Progressing

## 2019-08-05 NOTE — Anesthesia Postprocedure Evaluation (Signed)
Anesthesia Post Note  Patient: Mackenzie Madden  Procedure(s) Performed: CANNULATED HIP PINNING (Right Hip)     Patient location during evaluation: PACU Anesthesia Type: General Level of consciousness: awake Pain management: pain level controlled Respiratory status: spontaneous breathing Cardiovascular status: stable Postop Assessment: no apparent nausea or vomiting Anesthetic complications: no    Last Vitals:  Vitals:   08/05/19 1945 08/05/19 1958  BP: 134/83 (!) 162/66  Pulse: 79 78  Resp: 11   Temp: 36.4 C (!) 36.3 C  SpO2: 99% 98%    Last Pain:  Vitals:   08/05/19 1945  TempSrc:   PainSc: Asleep                 [name]

## 2019-08-05 NOTE — Progress Notes (Signed)
Patient ID: Mackenzie Madden, female   DOB: May 20, 1928, 85 y.o.   MRN: [id number] Awake and alert this morning.  Follows commands appropriately.  Understands we are proceeding with surgery later today (late afternoon) for fixation of her right hip fracture.  Vitals stable. H&H stable.

## 2019-08-05 NOTE — Anesthesia Preprocedure Evaluation (Addendum)
Anesthesia Evaluation  Patient identified by MRN, date of birth, ID band Patient awake    Reviewed: Allergy & Precautions, NPO status , Patient's Chart, lab work & pertinent test results  Airway Mallampati: II  TM Distance: >3 FB     Dental   Pulmonary    breath sounds clear to auscultation       Cardiovascular hypertension,  Rhythm:Regular Rate:Normal  Cardiac history noted. CG   Neuro/Psych    GI/Hepatic negative GI ROS, Neg liver ROS,   Endo/Other  negative endocrine ROSHypothyroidism   Renal/GU negative Renal ROS     Musculoskeletal   Abdominal   Peds  Hematology   Anesthesia Other Findings   Reproductive/Obstetrics                            Anesthesia Physical Anesthesia Plan  ASA: III  Anesthesia Plan: General   Post-op Pain Management:    Induction: Intravenous  PONV Risk Score and Plan: 3 and Treatment may vary due to age or medical condition and Ondansetron  Airway Management Planned:   Additional Equipment:   Intra-op Plan:   Post-operative Plan: Possible Post-op intubation/ventilation  Informed Consent: I have reviewed the patients History and Physical, chart, labs and discussed the procedure including the risks, benefits and alternatives for the proposed anesthesia with the patient or authorized representative who has indicated his/her understanding and acceptance.     Dental advisory given  Plan Discussed with: Anesthesiologist and CRNA  Anesthesia Plan Comments:        Anesthesia Quick Evaluation

## 2019-08-05 NOTE — Brief Op Note (Signed)
08/04/2019 - 08/05/2019  6:28 PM  PATIENT:  Mackenzie Madden  85 y.o. female  PRE-OPERATIVE DIAGNOSIS:  Hip fracture  POST-OPERATIVE DIAGNOSIS:  Hip fracture  PROCEDURE:  Procedure(s): CANNULATED HIP PINNING (Right)  SURGEON:  Surgeon(s) and Role:    Mcarthur Rossetti, MD - Primary  PHYSICIAN ASSISTANT:  Benita Stabile, PA-C  ANESTHESIA:   general  COUNTS:  YES  DICTATION: .Other Dictation: Dictation Number [id number]  PLAN OF CARE: Admit to inpatient   PATIENT DISPOSITION:  PACU - hemodynamically stable.   Delay start of Pharmacological VTE agent (>24hrs) due to surgical blood loss or risk of bleeding: no

## 2019-08-05 NOTE — Anesthesia Procedure Notes (Addendum)
Procedure Name: Intubation Date/Time: 08/05/2019 5:24 PM Performed by: West Pugh, CRNA Pre-anesthesia Checklist: Patient identified, Emergency Drugs available, Suction available, Patient being monitored and Timeout performed Patient Re-evaluated:Patient Re-evaluated prior to induction Oxygen Delivery Method: Circle system utilized Preoxygenation: Pre-oxygenation with 100% oxygen Induction Type: IV induction Ventilation: Mask ventilation without difficulty Laryngoscope Size: Mac and 4 Grade View: Grade I Tube type: Oral Tube size: 7.0 mm Number of attempts: 1 Airway Equipment and Method: Stylet Placement Confirmation: ETT inserted through vocal cords under direct vision,  positive ETCO2,  CO2 detector and breath sounds checked- equal and bilateral Secured at: 21 cm Tube secured with: Tape Dental Injury: Teeth and Oropharynx as per pre-operative assessment

## 2019-08-05 NOTE — Transfer of Care (Signed)
Immediate Anesthesia Transfer of Care Note  Patient: Mackenzie Madden  Procedure(s) Performed: CANNULATED HIP PINNING (Right Hip)  Patient Location: PACU  Anesthesia Type:General  Level of Consciousness: awake, alert  and patient cooperative  Airway & Oxygen Therapy: Patient Spontanous Breathing and Patient connected to face mask oxygen  Post-op Assessment: Report given to RN and Post -op Vital signs reviewed and stable  Post vital signs: Reviewed and stable  Last Vitals:  Vitals Value Taken Time  BP    Temp    Pulse    Resp    SpO2      Last Pain:  Vitals:   08/05/19 1849  TempSrc:   PainSc: (P) 0-No pain      Patients Stated Pain Goal: 4 ([id number])  Complications: No apparent anesthesia complications

## 2019-08-05 NOTE — Progress Notes (Signed)
PROGRESS NOTE    Mackenzie Madden  [id number] DOB: 05/22/1928 DOA: 08/04/2019 PCP: Lavone Orn, MD    Brief Narrative:  Patient is a 85 year old female with history of hypertension, glaucoma, hypothyroidism, prior history of pubic ramus fracture, multiple falls (4) this past year per patient presented to the ED after mechanical fall.  Work-up noted a right subcapital femoral fracture with mild impaction.  Orthopedics consulted.  Patient for surgery today, 08/05/2019.   Assessment & Plan:   Principal Problem:   Closed right femoral fracture (HCC) Active Problems:   Hip fracture (HCC)   HTN (hypertension)   Hypothyroid   Chronic diastolic heart failure (HCC)   Acute lower UTI   Leukocytosis  1 acute right subcapital femoral fracture with mild impaction Secondary to mechanical fall.  Patient seen by orthopedics and patient for surgery today.  Postop DVT prophylaxis per orthopedics.  Pain management per orthopedics.  Appreciate orthopedics input and recommendations.  2.  Hypothyroidism Continue home dose Synthroid.  3.  Chronic diastolic heart failure Euvolemic.  Patient on gentle hydration.  Monitor closely for volume overload.  4.  Leukocytosis Likely reactive versus secondary to possible UTI.  Urine cultures pending.  Patient started on IV Rocephin.  5.  Abnormal urinalysis/probable UTI Urine cultures pending.  Leukocytosis trending down.  Placed on IV Rocephin.  Follow.  6.  Hypertension Continue Avapro.  Patient started on low-dose Coreg perioperatively.  Follow.  7.  Glaucoma Continue home eyedrops.  8.  Gastroesophageal reflux disease PPI.  Maalox as needed.  9.  Chronic left bundle branch block Stable.  Patient asymptomatic.  Continue Avapro.  Patient started on Coreg perioperatively.   DVT prophylaxis: SCDs Code Status: Full Family Communication: Updated patient.  No family at bedside. Disposition Plan: To be determined.   Consultants:    Orthopedics: Dr. Ninfa Linden 08/04/2019  Procedures:   Plain films of the right hip and pelvis 08/04/2019  Chest x-ray 08/04/2019  Antimicrobials:   IV Rocephin 08/05/2019   Subjective: Patient sitting up in bed.  Stated had some abdominal discomfort earlier on which is since improved.  Denies any chest pain or shortness of breath.  Objective: Vitals:   08/04/19 1900 08/04/19 2043 08/05/19 0508 08/05/19 0711  BP: (!) 155/95 (!) 183/76 138/65 134/71  Pulse: 73 79 76 63  Resp: 17   18  Temp:  98.3 F (36.8 C) 98.2 F (36.8 C) 97.7 F (36.5 C)  TempSrc:  Oral Oral Oral  SpO2: 97% 99% 98%     Intake/Output Summary (Last 24 hours) at 08/05/2019 0858 Last data filed at 08/05/2019 [id number] Gross per 24 hour  Intake 797.55 ml  Output 575 ml  Net 222.55 ml   There were no vitals filed for this visit.  Examination:  General exam: Appears calm and comfortable  Respiratory system: Clear to auscultation anterior lung fields. Respiratory effort normal. Cardiovascular system: S1 & S2 heard, RRR. No JVD, murmurs, rubs, gallops or clicks. No pedal edema. Gastrointestinal system: Abdomen is nondistended, soft and nontender. No organomegaly or masses felt. Normal bowel sounds heard. Central nervous system: Alert and oriented. No focal neurological deficits. Extremities: Right lower extremity externally rotated and minimally shortened.  Ice packs on right hip.  Skin: No rashes, lesions or ulcers Psychiatry: Judgement and insight appear normal. Mood & affect appropriate.     Data Reviewed: I have personally reviewed following labs and imaging studies  CBC: Recent Labs  Lab 08/04/19 1627 08/05/19 0259  WBC 11.3* 10.5  NEUTROABS  8.5*  --   HGB 15.0 13.6  HCT 45.9 41.5  MCV 96.4 98.1  PLT 158 [id number]*   Basic Metabolic Panel: Recent Labs  Lab 08/04/19 1627 08/05/19 0259  NA 136 136  K 3.8 4.0  CL 101 103  CO2 27 26  GLUCOSE 114* 135*  BUN 13 13  CREATININE 0.69 0.77  CALCIUM  9.6 9.1   GFR: CrCl cannot be calculated (Unknown ideal weight.). Liver Function Tests: No results for input(s): AST, ALT, ALKPHOS, BILITOT, PROT, ALBUMIN in the last 168 hours. No results for input(s): LIPASE, AMYLASE in the last 168 hours. No results for input(s): AMMONIA in the last 168 hours. Coagulation Profile: Recent Labs  Lab 08/04/19 1627 08/05/19 0259  INR 0.9 1.1   Cardiac Enzymes: No results for input(s): CKTOTAL, CKMB, CKMBINDEX, TROPONINI in the last 168 hours. BNP (last 3 results) No results for input(s): PROBNP in the last 8760 hours. HbA1C: No results for input(s): HGBA1C in the last 72 hours. CBG: Recent Labs  Lab 08/05/19 0738  GLUCAP 167*   Lipid Profile: No results for input(s): CHOL, HDL, LDLCALC, TRIG, CHOLHDL, LDLDIRECT in the last 72 hours. Thyroid Function Tests: No results for input(s): TSH, T4TOTAL, FREET4, T3FREE, THYROIDAB in the last 72 hours. Anemia Panel: No results for input(s): VITAMINB12, FOLATE, FERRITIN, TIBC, IRON, RETICCTPCT in the last 72 hours. Sepsis Labs: No results for input(s): PROCALCITON, LATICACIDVEN in the last 168 hours.  Recent Results (from the past 240 hour(s))  SARS Coronavirus 2 by RT PCR (hospital order, performed in [facility] hospital lab) Nasopharyngeal Nasopharyngeal Swab     Status: None   Collection Time: 08/04/19  4:27 PM   Specimen: Nasopharyngeal Swab  Result Value Ref Range Status   SARS Coronavirus 2 NEGATIVE NEGATIVE Final    Comment: (NOTE) If result is NEGATIVE SARS-CoV-2 target nucleic acids are NOT DETECTED. The SARS-CoV-2 RNA is generally detectable in upper and lower  respiratory specimens during the acute phase of infection. The lowest  concentration of SARS-CoV-2 viral copies this assay can detect is 250  copies / mL. A negative result does not preclude SARS-CoV-2 infection  and should not be used as the sole basis for treatment or other  patient management decisions.  A negative result may  occur with  improper specimen collection / handling, submission of specimen other  than nasopharyngeal swab, presence of viral mutation(s) within the  areas targeted by this assay, and inadequate number of viral copies  (<250 copies / mL). A negative result must be combined with clinical  observations, patient history, and epidemiological information. If result is POSITIVE SARS-CoV-2 target nucleic acids are DETECTED. The SARS-CoV-2 RNA is generally detectable in upper and lower  respiratory specimens dur ing the acute phase of infection.  Positive  results are indicative of active infection with SARS-CoV-2.  Clinical  correlation with patient history and other diagnostic information is  necessary to determine patient infection status.  Positive results do  not rule out bacterial infection or co-infection with other viruses. If result is PRESUMPTIVE POSTIVE SARS-CoV-2 nucleic acids MAY BE PRESENT.   A presumptive positive result was obtained on the submitted specimen  and confirmed on repeat testing.  While 2019 novel coronavirus  (SARS-CoV-2) nucleic acids may be present in the submitted sample  additional confirmatory testing may be necessary for epidemiological  and / or clinical management purposes  to differentiate between  SARS-CoV-2 and other Sarbecovirus currently known to infect humans.  If clinically indicated additional  testing with an alternate test  methodology [id number]) is advised. The SARS-CoV-2 RNA is generally  detectable in upper and lower respiratory sp ecimens during the acute  phase of infection. The expected result is Negative. Fact Sheet for Patients:  StrictlyIdeas.no Fact Sheet for Healthcare Providers: BankingDealers.co.za This test is not yet approved or cleared by the [place] FDA and has been authorized for detection and/or diagnosis of SARS-CoV-2 by FDA under an Emergency Use Authorization (EUA).  This  EUA will remain in effect (meaning this test can be used) for the duration of the COVID-19 declaration under Section 564(b)(1) of the Act, 21 U.S.C. section 360bbb-3(b)(1), unless the authorization is terminated or revoked sooner. Performed at [facility], [place] [address] [place], [place] [address]   Surgical pcr screen     Status: None   Collection Time: 08/05/19  5:29 AM   Specimen: Nasal Mucosa; Nasal Swab  Result Value Ref Range Status   MRSA, PCR NEGATIVE NEGATIVE Final   Staphylococcus aureus NEGATIVE NEGATIVE Final    Comment: (NOTE) The Xpert SA Assay (FDA approved for NASAL specimens in patients 34 years of age and older), is one component of a comprehensive surveillance program. It is not intended to diagnose infection nor to guide or monitor treatment. Performed at [facility], [place] [address] [place], [place] [address]          Radiology Studies: Dg Chest 1 View  Result Date: 08/04/2019 CLINICAL DATA:  Right hip pain following a fall today. EXAM: CHEST  1 VIEW COMPARISON:  11/11/2018 FINDINGS: Stable enlarged cardiac silhouette and tortuous and calcified thoracic aorta. The lungs remain clear and hyperexpanded with mild diffuse peribronchial thickening. Old, healed 8th and 9th rib fractures on the right. No acute fracture or pneumothorax. IMPRESSION: 1. No acute abnormality. 2. Stable cardiomegaly and changes of COPD and chronic bronchitis. 3. Aortic atherosclerosis. Electronically Signed   By: Claudie Revering M.D.   On: 08/04/2019 15:53   Dg Hip Unilat With Pelvis 2-3 Views Right  Result Date: 08/04/2019 CLINICAL DATA:  Fall, hip pain EXAM: DG HIP (WITH OR WITHOUT PELVIS) 2-3V RIGHT COMPARISON:  None. FINDINGS: There is anatomic alignment at the right hip joint. Acute fracture at base of femoral head with mild impaction. 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 IMPRESSION: Acute right subcapital femoral fracture  with mild impaction. Electronically Signed   By: Macy Mis M.D.   On: 08/04/2019 15:54        Scheduled Meds: . brimonidine  1 drop Left Eye Q12H   And  . timolol  1 drop Left Eye Q12H  . calcium carbonate  500 mg Oral Daily  . carvedilol  3.125 mg Oral BID WC  . Chlorhexidine Gluconate Cloth  6 each Topical Once  . irbesartan  37.5 mg Oral Daily  . latanoprost  1 drop Both Eyes QHS  . levothyroxine  88 mcg Oral QAC breakfast  . multivitamin with minerals  1 tablet Oral Daily  . pantoprazole  40 mg Oral Daily  . senna  1 tablet Oral BID   Continuous Infusions: . sodium chloride 75 mL/hr at 08/04/19 2047  . cefTRIAXone (ROCEPHIN)  IV    . methocarbamol (ROBAXIN) IV       LOS: 1 day    Time spent: 35 minutes    Irine Seal, MD [facility]  If 7PM-7AM, please contact night-coverage [email or web address] 08/05/2019, 8:58 AM

## 2019-08-06 ENCOUNTER — Encounter (HOSPITAL_COMMUNITY): Payer: Self-pay | Admitting: Orthopaedic Surgery

## 2019-08-06 DIAGNOSIS — S72011A Unspecified intracapsular fracture of right femur, initial encounter for closed fracture: Principal | ICD-10-CM

## 2019-08-06 DIAGNOSIS — S72144D Nondisplaced intertrochanteric fracture of right femur, subsequent encounter for closed fracture with routine healing: Secondary | ICD-10-CM

## 2019-08-06 DIAGNOSIS — I5032 Chronic diastolic (congestive) heart failure: Secondary | ICD-10-CM

## 2019-08-06 DIAGNOSIS — W19XXXA Unspecified fall, initial encounter: Secondary | ICD-10-CM

## 2019-08-06 DIAGNOSIS — N390 Urinary tract infection, site not specified: Secondary | ICD-10-CM

## 2019-08-06 LAB — URINE CULTURE: Culture: >=100000 — AB

## 2019-08-06 LAB — CBC WITH DIFFERENTIAL/PLATELET
Abs Immature Granulocytes: 0.03 10*3/uL (ref 0.00–0.07)
Basophils Absolute: 0 10*3/uL (ref 0.0–0.1)
Basophils Relative: 1 %
Eosinophils Absolute: 0.1 10*3/uL (ref 0.0–0.5)
Eosinophils Relative: 1 %
HCT: 32.5 % — ABNORMAL LOW (ref 36.0–46.0)
Hemoglobin: 10.3 g/dL — ABNORMAL LOW (ref 12.0–15.0)
Immature Granulocytes: 0 %
Lymphocytes Relative: 23 %
Lymphs Abs: 1.8 10*3/uL (ref 0.7–4.0)
MCH: 32 pg (ref 26.0–34.0)
MCHC: 31.7 g/dL (ref 30.0–36.0)
MCV: 100.9 fL — ABNORMAL HIGH (ref 80.0–100.0)
Monocytes Absolute: 1.1 10*3/uL — ABNORMAL HIGH (ref 0.1–1.0)
Monocytes Relative: 14 %
Neutro Abs: 4.8 10*3/uL (ref 1.7–7.7)
Neutrophils Relative %: 61 %
Platelets: 129 10*3/uL — ABNORMAL LOW (ref 150–400)
RBC: 3.22 MIL/uL — ABNORMAL LOW (ref 3.87–5.11)
RDW: 14.5 % (ref 11.5–15.5)
WBC: 7.8 10*3/uL (ref 4.0–10.5)
nRBC: 0 % (ref 0.0–0.2)

## 2019-08-06 LAB — BASIC METABOLIC PANEL
Anion gap: 8 (ref 5–15)
BUN: 12 mg/dL (ref 8–23)
CO2: 23 mmol/L (ref 22–32)
Calcium: 7.8 mg/dL — ABNORMAL LOW (ref 8.9–10.3)
Chloride: 103 mmol/L (ref 98–111)
Creatinine, Ser: 0.62 mg/dL (ref 0.44–1.00)
GFR calc Af Amer: >60 mL/min (ref >60)
GFR calc non Af Amer: >60 mL/min (ref >60)
Glucose, Bld: 98 mg/dL (ref 70–99)
Potassium: 3.7 mmol/L (ref 3.5–5.1)
Sodium: 134 mmol/L — ABNORMAL LOW (ref 135–145)

## 2019-08-06 MED ORDER — ASPIRIN 325 MG PO TBEC: 325.0000 mg | DELAYED_RELEASE_TABLET | Freq: Every day | ORAL | 0 refills | Status: DC

## 2019-08-06 MED ORDER — CARVEDILOL 3.125 MG PO TABS: 3.1250 mg | ORAL_TABLET | Freq: Two times a day (BID) | ORAL | 1 refills | Status: DC

## 2019-08-06 MED ORDER — CEFUROXIME AXETIL 250 MG PO TABS: 250.0000 mg | ORAL_TABLET | Freq: Two times a day (BID) | ORAL | 0 refills | Status: AC

## 2019-08-06 MED ORDER — ACETAMINOPHEN 325 MG PO TABS: 325.0000 mg | ORAL_TABLET | Freq: Four times a day (QID) | ORAL | Status: DC | PRN

## 2019-08-06 MED ORDER — HYDROCODONE-ACETAMINOPHEN 7.5-325 MG PO TABS: 1.0000 | ORAL_TABLET | ORAL | 0 refills | Status: DC | PRN

## 2019-08-06 MED ORDER — SENNA 8.6 MG PO TABS: 1.0000 | ORAL_TABLET | Freq: Two times a day (BID) | ORAL | 0 refills | Status: DC

## 2019-08-06 MED ORDER — POLYETHYLENE GLYCOL 3350 17 G PO PACK: 17.0000 g | PACK | Freq: Every day | ORAL | 0 refills | Status: DC | PRN

## 2019-08-06 MED ORDER — ONDANSETRON HCL 4 MG PO TABS: 4.0000 mg | ORAL_TABLET | Freq: Four times a day (QID) | ORAL | 0 refills | Status: DC | PRN

## 2019-08-06 MED ORDER — DOCUSATE SODIUM 100 MG PO CAPS: 100.0000 mg | ORAL_CAPSULE | Freq: Two times a day (BID) | ORAL | 0 refills | Status: DC

## 2019-08-06 NOTE — Discharge Summary (Addendum)
Physician Discharge Summary  Mackenzie Madden [id number] DOB: Dec 26, 1927 DOA: 08/04/2019  PCP: Lavone Orn, MD  Admit date: 08/04/2019 Discharge date: 08/07/2019  Admitted From: Home Disposition: Home  Recommendations for Outpatient Follow-up:  1. Follow up with PCP in 1-2 weeks 2. Please obtain BMP/CBC in one week 3. Please follow up with Dr. Ninfa Linden  Home Health: Yes Equipment/Devices none Discharge Condition stable CODE STATUS full code Diet recommendation: Cardiac Brief/Interim Summary:85 year old female with history of hypertension, glaucoma, hypothyroidism, prior history of pubic ramus fracture, multiple falls (4) this past year per patient presented to the ED after mechanical fall.  Work-up noted a right subcapital femoral fracture with mild impaction.  Orthopedics consulted.  Patient for surgery today, 08/05/2019.   Discharge Diagnoses:  Principal Problem:   Closed right femoral fracture (HCC) Active Problems:   HTN (hypertension)   Hypothyroid   Chronic diastolic heart failure (HCC)   Acute lower UTI   Leukocytosis   Hip fracture (HCC)   1 acute right subcapital femoral fracture with mild impaction Secondary to mechanical fall.    Patient status post surgery by Dr. Ninfa Linden.  Continue aspirin per ortho for dvt prophylaxis  2.  Hypothyroidism Continue home dose Synthroid.  3.  Chronic diastolic heart failure Stable  4.  Leukocytosis secondary to UTI she received Rocephin during hospital stay.  She will be discharged on Ceftin twice a day for 3 more days.  5.  Hypertension blood pressure too soft to restart Avapro.  I will hold on discharge.  Continue Coreg.  6.  Glaucoma Continue home eyedrops.  7.  Gastroesophageal reflux disease-stable continue PPI.  8.  Chronic left bundle branch block-stable continue Coreg    Nutrition Problem: Inadequate oral intake Etiology: inability to eat    Signs/Symptoms: NPO status     Interventions:  Ensure Enlive (each supplement provides 350kcal and 20 grams of protein), MVI  Estimated body mass index is 19.33 kg/m as calculated from the following:   Height as of this encounter: 5' (1.524 m).   Weight as of this encounter: 44.9 kg.  Discharge Instructions   Allergies as of 08/06/2019   No Known Allergies     Medication List    STOP taking these medications   senna-docusate 8.6-50 MG tablet Commonly known as: Senokot-S     TAKE these medications   acetaminophen 325 MG tablet Commonly known as: TYLENOL Take 1-2 tablets (325-650 mg total) by mouth every 6 (six) hours as needed for mild pain (pain score 1-3 or temp > 100.5).   aspirin 325 MG EC tablet Take 1 tablet (325 mg total) by mouth daily with breakfast. Start taking on: August 07, 2019   brimonidine-timolol 0.2-0.5 % ophthalmic solution Commonly known as: COMBIGAN Place 1 drop into the left eye every 12 (twelve) hours.   Calcium 250 MG Caps Take 250 mg by mouth daily.   carvedilol 3.125 MG tablet Commonly known as: COREG Take 1 tablet (3.125 mg total) by mouth 2 (two) times daily with a meal.   cefUROXime 250 MG tablet Commonly known as: Ceftin Take 1 tablet (250 mg total) by mouth 2 (two) times daily for 3 days.   docusate sodium 100 MG capsule Commonly known as: COLACE Take 1 capsule (100 mg total) by mouth 2 (two) times daily.   HYDROcodone-acetaminophen 7.5-325 MG tablet Commonly known as: NORCO Take 1-2 tablets by mouth every 4 (four) hours as needed for severe pain (pain score 7-10).   latanoprost 0.005 % ophthalmic solution Commonly  known as: XALATAN Place 1 drop into both eyes at bedtime.   levothyroxine 88 MCG tablet Commonly known as: SYNTHROID Take 88 mcg by mouth daily before breakfast.   multivitamin with minerals Tabs tablet Take 1 tablet by mouth daily.   naproxen sodium 220 MG tablet Commonly known as: ALEVE Take 220 mg by mouth daily as needed (pain). ALEVE   omeprazole 20  MG capsule Commonly known as: PRILOSEC Take 1 capsule (20 mg total) by mouth daily before breakfast. 30 minutes before   ondansetron 4 MG tablet Commonly known as: ZOFRAN Take 1 tablet (4 mg total) by mouth every 6 (six) hours as needed for nausea.   polyethylene glycol 17 g packet Commonly known as: MIRALAX / GLYCOLAX Take 17 g by mouth daily as needed for mild constipation.   senna 8.6 MG Tabs tablet Commonly known as: SENOKOT Take 1 tablet (8.6 mg total) by mouth 2 (two) times daily.   valsartan 40 MG tablet Commonly known as: DIOVAN Take 40 mg by mouth daily as needed. If bp over 150 to take 40mg .      Follow-up Information    Mcarthur Rossetti, MD. Schedule an appointment as soon as possible for a visit in 2 week(s).   Specialty: Orthopedic Surgery Why: Our new address is [address] Next door to our old Physicist, medical information: [place] [address] [phone]        Lavone Orn, MD Follow up.   Specialty: Internal Medicine Contact information: [address] [place] [address] [phone]          No Known Allergies  Consultations: Ortho  Procedures/Studies: Dg Chest 1 View  Result Date: 08/04/2019 CLINICAL DATA:  Right hip pain following a fall today. EXAM: CHEST  1 VIEW COMPARISON:  11/11/2018 FINDINGS: Stable enlarged cardiac silhouette and tortuous and calcified thoracic aorta. The lungs remain clear and hyperexpanded with mild diffuse peribronchial thickening. Old, healed 8th and 9th rib fractures on the right. No acute fracture or pneumothorax. IMPRESSION: 1. No acute abnormality. 2. Stable cardiomegaly and changes of COPD and chronic bronchitis. 3. Aortic atherosclerosis. Electronically Signed   By: Claudie Revering M.D.   On: 08/04/2019 15:53   Pelvis Portable  Result Date: 08/05/2019 CLINICAL DATA:  Right hip fracture fixation EXAM: PORTABLE PELVIS 1-2 VIEWS; OPERATIVE RIGHT HIP  WITH PELVIS COMPARISON:  08/04/2019 FINDINGS: Intraoperative spot films and a postoperative AP film demonstrate 3 cannulated hip screws in good position without complicating features they are transfixing the femoral neck fracture. Moderate to advanced degenerative changes involving both hips. The visualized bony pelvis is intact. IMPRESSION: Three cannulated hip screws transfixing the high femoral neck fracture without complicating features. Electronically Signed   By: Marijo Sanes M.D.   On: 08/05/2019 19:22   Dg C-arm 1-60 Min-no Report  Result Date: 08/05/2019 Fluoroscopy was utilized by the requesting physician.  No radiographic interpretation.   Dg Hip Operative Unilat W Or W/o Pelvis Right  Result Date: 08/05/2019 CLINICAL DATA:  Right hip fracture fixation EXAM: PORTABLE PELVIS 1-2 VIEWS; OPERATIVE RIGHT HIP WITH PELVIS COMPARISON:  08/04/2019 FINDINGS: Intraoperative spot films and a postoperative AP film demonstrate 3 cannulated hip screws in good position without complicating features they are transfixing the femoral neck fracture. Moderate to advanced degenerative changes involving both hips. The visualized bony pelvis is intact. IMPRESSION: Three cannulated hip screws transfixing the high femoral neck fracture without complicating features. Electronically Signed   By: Mamie Nick.  Gallerani M.D.   On: 08/05/2019 19:22   Dg Hip Unilat With Pelvis 2-3 Views Right  Result Date: 08/04/2019 CLINICAL DATA:  Fall, hip pain EXAM: DG HIP (WITH OR WITHOUT PELVIS) 2-3V RIGHT COMPARISON:  None. FINDINGS: There is anatomic alignment at the right hip joint. Acute fracture at base of femoral head with mild impaction. 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 IMPRESSION: Acute right subcapital femoral fracture with mild impaction. Electronically Signed   By: Macy Mis M.D.   On: 08/04/2019 15:54    (Echo, Carotid, EGD, Colonoscopy, ERCP)    Subjective:  Resting in bed no complaints  anxious to go home Discharge Exam: Vitals:   08/06/19 0918 08/06/19 1338  BP: (!) 154/65 (!) 113/59  Pulse: 78 73  Resp: 16 17  Temp: 98 F (36.7 C) 98.2 F (36.8 C)  SpO2: 99% 94%   Vitals:   08/06/19 0101 08/06/19 0546 08/06/19 0918 08/06/19 1338  BP: (!) 119/57 (!) 145/71 (!) 154/65 (!) 113/59  Pulse: 77 74 78 73  Resp: 17 15 16 17   Temp: 98.1 F (36.7 C) 98 F (36.7 C) 98 F (36.7 C) 98.2 F (36.8 C)  TempSrc:   Oral Oral  SpO2: 98% 98% 99% 94%  Weight:      Height:        General: Pt is alert, awake, not in acute distress Cardiovascular: RRR, S1/S2 +, no rubs, no gallops Respiratory: CTA bilaterally, no wheezing, no rhonchi Abdominal: Soft, NT, ND, bowel sounds + Extremities: no edema, no cyanosis right hip dressings intact no erythema surrounding noted   The results of significant diagnostics from this hospitalization (including imaging, microbiology, ancillary and laboratory) are listed below for reference.     Microbiology: Recent Results (from the past 240 hour(s))  SARS Coronavirus 2 by RT PCR (hospital order, performed in [facility] hospital lab) Nasopharyngeal Nasopharyngeal Swab     Status: None   Collection Time: 08/04/19  4:27 PM   Specimen: Nasopharyngeal Swab  Result Value Ref Range Status   SARS Coronavirus 2 NEGATIVE NEGATIVE Final    Comment: (NOTE) If result is NEGATIVE SARS-CoV-2 target nucleic acids are NOT DETECTED. The SARS-CoV-2 RNA is generally detectable in upper and lower  respiratory specimens during the acute phase of infection. The lowest  concentration of SARS-CoV-2 viral copies this assay can detect is 250  copies / mL. A negative result does not preclude SARS-CoV-2 infection  and should not be used as the sole basis for treatment or other  patient management decisions.  A negative result may occur with  improper specimen collection / handling, submission of specimen other  than nasopharyngeal swab, presence of viral  mutation(s) within the  areas targeted by this assay, and inadequate number of viral copies  (<250 copies / mL). A negative result must be combined with clinical  observations, patient history, and epidemiological information. If result is POSITIVE SARS-CoV-2 target nucleic acids are DETECTED. The SARS-CoV-2 RNA is generally detectable in upper and lower  respiratory specimens dur ing the acute phase of infection.  Positive  results are indicative of active infection with SARS-CoV-2.  Clinical  correlation with patient history and other diagnostic information is  necessary to determine patient infection status.  Positive results do  not rule out bacterial infection or co-infection with other viruses. If result is PRESUMPTIVE POSTIVE SARS-CoV-2 nucleic acids MAY BE PRESENT.   A presumptive positive result was obtained on the submitted specimen  and confirmed on repeat  testing.  While 2019 novel coronavirus  (SARS-CoV-2) nucleic acids may be present in the submitted sample  additional confirmatory testing may be necessary for epidemiological  and / or clinical management purposes  to differentiate between  SARS-CoV-2 and other Sarbecovirus currently known to infect humans.  If clinically indicated additional testing with an alternate test  methodology [phone]) is advised. The SARS-CoV-2 RNA is generally  detectable in upper and lower respiratory sp ecimens during the acute  phase of infection. The expected result is Negative. Fact Sheet for Patients:  StrictlyIdeas.no Fact Sheet for Healthcare Providers: BankingDealers.co.za This test is not yet approved or cleared by the [place] FDA and has been authorized for detection and/or diagnosis of SARS-CoV-2 by FDA under an Emergency Use Authorization (EUA).  This EUA will remain in effect (meaning this test can be used) for the duration of the COVID-19 declaration under Section 564(b)(1)  of the Act, 21 U.S.C. section 360bbb-3(b)(1), unless the authorization is terminated or revoked sooner. Performed at [facility], [place] [address], [place], [place] [address]   Surgical pcr screen     Status: None   Collection Time: 08/05/19  5:29 AM   Specimen: Nasal Mucosa; Nasal Swab  Result Value Ref Range Status   MRSA, PCR NEGATIVE NEGATIVE Final   Staphylococcus aureus NEGATIVE NEGATIVE Final    Comment: (NOTE) The Xpert SA Assay (FDA approved for NASAL specimens in patients 52 years of age and older), is one component of a comprehensive surveillance program. It is not intended to diagnose infection nor to guide or monitor treatment. Performed at [facility], [place] [address], [place], [place] [address]   Urine culture     Status: Abnormal   Collection Time: 08/05/19  5:36 AM   Specimen: Urine, Clean Catch  Result Value Ref Range Status   Specimen Description   Final    URINE, CLEAN CATCH Performed at [facility], [place] [address], [place], [place] [address]    Special Requests   Final    NONE Performed at [facility], [place] [address] [place], [place] [address]    Culture >=100,000 COLONIES/mL VIRIDANS STREPTOCOCCUS (A)  Final   Report Status 08/06/2019 FINAL  Final     Labs: BNP (last 3 results) No results for input(s): BNP in the last 8760 hours. Basic Metabolic Panel: Recent Labs  Lab 08/04/19 1627 08/05/19 0259 08/06/19 0247  NA 136 136 134*  K 3.8 4.0 3.7  CL 101 103 103  CO2 27 26 23   GLUCOSE 114* 135* 98  BUN 13 13 12   CREATININE 0.69 0.77 0.62  CALCIUM 9.6 9.1 7.8*   Liver Function Tests: No results for input(s): AST, ALT, ALKPHOS, BILITOT, PROT, ALBUMIN in the last 168 hours. No results for input(s): LIPASE, AMYLASE in the last 168 hours. No results for input(s): AMMONIA in the last 168 hours. CBC: Recent Labs  Lab 08/04/19 1627 08/05/19 0259 08/06/19 0247  WBC  11.3* 10.5 7.8  NEUTROABS 8.5*  --  4.8  HGB 15.0 13.6 10.3*  HCT 45.9 41.5 32.5*  MCV 96.4 98.1 100.9*  PLT 158 141* 129*   Cardiac Enzymes: No results for input(s): CKTOTAL, CKMB, CKMBINDEX, TROPONINI in the last 168 hours. BNP: Invalid input(s): POCBNP CBG: Recent Labs  Lab 08/05/19 0738  GLUCAP 167*   D-Dimer No results for input(s): DDIMER in the last 72 hours. Hgb A1c No results for input(s): HGBA1C in the last 72 hours. Lipid Profile  No results for input(s): CHOL, HDL, LDLCALC, TRIG, CHOLHDL, LDLDIRECT in the last 72 hours. Thyroid function studies No results for input(s): TSH, T4TOTAL, T3FREE, THYROIDAB in the last 72 hours.  Invalid input(s): FREET3 Anemia work up No results for input(s): VITAMINB12, FOLATE, FERRITIN, TIBC, IRON, RETICCTPCT in the last 72 hours. Urinalysis    Component Value Date/Time   COLORURINE YELLOW 08/05/2019 0536   APPEARANCEUR CLEAR 08/05/2019 0536   LABSPEC 1.010 08/05/2019 0536   PHURINE 7.0 08/05/2019 0536   GLUCOSEU NEGATIVE 08/05/2019 0536   HGBUR SMALL (A) 08/05/2019 0536   BILIRUBINUR NEGATIVE 08/05/2019 0536   KETONESUR NEGATIVE 08/05/2019 0536   PROTEINUR NEGATIVE 08/05/2019 0536   UROBILINOGEN 0.2 02/21/2015 1558   NITRITE NEGATIVE 08/05/2019 0536   LEUKOCYTESUR MODERATE (A) 08/05/2019 0536   Sepsis Labs Invalid input(s): PROCALCITONIN,  WBC,  LACTICIDVEN Microbiology Recent Results (from the past 240 hour(s))  SARS Coronavirus 2 by RT PCR (hospital order, performed in [place] hospital lab) Nasopharyngeal Nasopharyngeal Swab     Status: None   Collection Time: 08/04/19  4:27 PM   Specimen: Nasopharyngeal Swab  Result Value Ref Range Status   SARS Coronavirus 2 NEGATIVE NEGATIVE Final    Comment: (NOTE) If result is NEGATIVE SARS-CoV-2 target nucleic acids are NOT DETECTED. The SARS-CoV-2 RNA is generally detectable in upper and lower  respiratory specimens during the acute phase of infection. The lowest   concentration of SARS-CoV-2 viral copies this assay can detect is 250  copies / mL. A negative result does not preclude SARS-CoV-2 infection  and should not be used as the sole basis for treatment or other  patient management decisions.  A negative result may occur with  improper specimen collection / handling, submission of specimen other  than nasopharyngeal swab, presence of viral mutation(s) within the  areas targeted by this assay, and inadequate number of viral copies  (<250 copies / mL). A negative result must be combined with clinical  observations, patient history, and epidemiological information. If result is POSITIVE SARS-CoV-2 target nucleic acids are DETECTED. The SARS-CoV-2 RNA is generally detectable in upper and lower  respiratory specimens dur ing the acute phase of infection.  Positive  results are indicative of active infection with SARS-CoV-2.  Clinical  correlation with patient history and other diagnostic information is  necessary to determine patient infection status.  Positive results do  not rule out bacterial infection or co-infection with other viruses. If result is PRESUMPTIVE POSTIVE SARS-CoV-2 nucleic acids MAY BE PRESENT.   A presumptive positive result was obtained on the submitted specimen  and confirmed on repeat testing.  While 2019 novel coronavirus  (SARS-CoV-2) nucleic acids may be present in the submitted sample  additional confirmatory testing may be necessary for epidemiological  and / or clinical management purposes  to differentiate between  SARS-CoV-2 and other Sarbecovirus currently known to infect humans.  If clinically indicated additional testing with an alternate test  methodology [phone]) is advised. The SARS-CoV-2 RNA is generally  detectable in upper and lower respiratory sp ecimens during the acute  phase of infection. The expected result is Negative. Fact Sheet for Patients:  StrictlyIdeas.no Fact Sheet  for Healthcare Providers: BankingDealers.co.za This test is not yet approved or cleared by the [place] FDA and has been authorized for detection and/or diagnosis of SARS-CoV-2 by FDA under an Emergency Use Authorization (EUA).  This EUA will remain in effect (meaning this test can be used) for the duration of the COVID-19 declaration under Section  564(b)(1) of the Act, 21 U.S.C. section 360bbb-3(b)(1), unless the authorization is terminated or revoked sooner. Performed at [facility], [place] [address], [place], [place] [address]   Surgical pcr screen     Status: None   Collection Time: 08/05/19  5:29 AM   Specimen: Nasal Mucosa; Nasal Swab  Result Value Ref Range Status   MRSA, PCR NEGATIVE NEGATIVE Final   Staphylococcus aureus NEGATIVE NEGATIVE Final    Comment: (NOTE) The Xpert SA Assay (FDA approved for NASAL specimens in patients 17 years of age and older), is one component of a comprehensive surveillance program. It is not intended to diagnose infection nor to guide or monitor treatment. Performed at [facility], [place] [address], [place], [place] [address]   Urine culture     Status: Abnormal   Collection Time: 08/05/19  5:36 AM   Specimen: Urine, Clean Catch  Result Value Ref Range Status   Specimen Description   Final    URINE, CLEAN CATCH Performed at [facility], [place] [address], [place], [place] [address]    Special Requests   Final    NONE Performed at [facility], [place] [address] [place],  [address]    Culture >=100,000 COLONIES/mL VIRIDANS STREPTOCOCCUS (A)  Final   Report Status 08/06/2019 FINAL  Final     Time coordinating discharge:  34 minutes  SIGNED:   Georgette Shell, MD  [facility] 08/06/2019, 1:50 PM Pager   If 7PM-7AM, please contact night-coverage [email or web address] Password [email or web address]

## 2019-08-06 NOTE — Progress Notes (Cosign Needed)
    Durable Medical Equipment  (From admission, onward)         Start     Ordered   08/06/19 1529  For home use only DME Hospital bed  Once    Question Answer Comment  Length of Need 6 Months   The above medical condition requires: Patient requires the ability to reposition frequently   Head must be elevated greater than: 45 degrees   Bed type Semi-electric      08/06/19 1528

## 2019-08-06 NOTE — TOC Progression Note (Signed)
Transition of Care [facility]) - Progression Note    Patient Details  Name: Mackenzie Madden MRN: [id number] Date of Birth: Mar 29, 1928  Transition of Care [facility]) CM/SW Contact  Leeroy Cha, RN Phone Number: 08/06/2019, 2:59 PM  Clinical Narrative:    Went in room to speak with patient and get hhc set up  States that she needs a hospital bed for home and that she is not ready to go that she just had surg.  On 110320/explained to her that [facility] would be out three times a week to work with her.  Family is also hesitant about her going home today.  DR. Zigmund Daniel notified.   Expected Discharge Plan: [place] Barriers to Discharge: No Barriers Identified  Expected Discharge Plan and Services Expected Discharge Plan: [place]   Discharge Planning Services: CM Consult Post Acute Care Choice: Home Health, Durable Medical Equipment Living arrangements for the past 2 months: Single Family Home Expected Discharge Date: 08/06/19                         HH Arranged: PT HH Agency: [place] ([place]) Date HH Agency Contacted: 08/05/19 Time HH Agency Contacted: 0900 Representative spoke with at [place]: [place] ([place]) Interventions    Readmission Risk Interventions No flowsheet data found.

## 2019-08-06 NOTE — Progress Notes (Signed)
Subjective: 1 Day Post-Op Procedure(s) (LRB): CANNULATED HIP PINNING (Right) Patient reports pain as moderate.  Tolerated surgery well last evening.  Objective: Vital signs in last 24 hours: Temp:  [97.4 F (36.3 C)-98.6 F (37 C)] 98 F (36.7 C) (11/04 0546) Pulse Rate:  [66-86] 74 (11/04 0546) Resp:  [10-19] 15 (11/04 0546) BP: (119-162)/(57-83) 145/71 (11/04 0546) SpO2:  [98 %-100 %] 98 % (11/04 0546) Weight:  [44.9 kg] 44.9 kg (11/03 1550)  Intake/Output from previous day: 11/03 0701 - 11/04 0700 In: 2477.7 [P.O.:240; I.V.:1787.7; IV Piggyback:450] Out: [id number] [Urine:1750] Intake/Output this shift: No intake/output data recorded.  Recent Labs    08/04/19 1627 08/05/19 0259 08/06/19 0247  HGB 15.0 13.6 10.3*   Recent Labs    08/05/19 0259 08/06/19 0247  WBC 10.5 7.8  RBC 4.23 3.22*  HCT 41.5 32.5*  PLT 141* 129*   Recent Labs    08/05/19 0259 08/06/19 0247  NA 136 134*  K 4.0 3.7  CL 103 103  CO2 26 23  BUN 13 12  CREATININE 0.77 0.62  GLUCOSE 135* 98  CALCIUM 9.1 7.8*   Recent Labs    08/04/19 1627 08/05/19 0259  INR 0.9 1.1    Sensation intact distally Intact pulses distally Dorsiflexion/Plantar flexion intact Incision: scant drainage   Assessment/Plan: 1 Day Post-Op Procedure(s) (LRB): CANNULATED HIP PINNING (Right) Up with therapy Will need to be NWB on her right LE for at least 4 weeks to allow for adequate healing.     Mcarthur Rossetti 08/06/2019, 7:12 AM

## 2019-08-06 NOTE — Discharge Instructions (Signed)
Do not put any weight on your right lower extremity/hip.. Expect swelling; ice on and off as needed. The right hip dressing can get wet in the shower and can stay on until your follow-up appointment in 2 weeks.

## 2019-08-06 NOTE — Evaluation (Signed)
Occupational Therapy Evaluation Patient Details Name: Mackenzie Madden MRN: [id number] DOB: [birth year] Today's Date: 08/06/2019    History of Present Illness Patient is a 85 year old female with history of hypertension, glaucoma, hypothyroidism, prior history of pubic ramus fracture, multiple falls (4) this past year per patient presented to the ED after mechanical fall.  Work-up noted a right subcapital femoral fracture with mild impaction.  Pt had CANNULATED HIP PINNING (Right) on 08/04/3029 adn per MD and will be NWB for 4 weeks   Clinical Impression   Pt admitted with a fall and a femoral fx Pt currently with functional limitations due to the deficits listed below (see OT Problem List).  Pt will benefit from skilled OT to increase their safety and independence with ADL and functional mobility for ADL to facilitate discharge to venue listed below.   Pt will need to perform transfers only at home. Bed to WC or 3 n 1 and limit walking although did well with NWB     Follow Up Recommendations  Home health OT;SNF;Supervision/Assistance - 24 hour(depending on care at home)    Equipment Recommendations    3 n 1, wheelchair      Precautions / Restrictions Precautions Precautions: Fall Restrictions Weight Bearing Restrictions: Yes RLE Weight Bearing: Non weight bearing      Mobility Bed Mobility Overal bed mobility: Needs Assistance Bed Mobility: Supine to Sit     Supine to sit: Max assist        Transfers Overall transfer level: Needs assistance Equipment used: Rolling walker (2 wheeled) Transfers: Sit to/from [facility] Sit to Stand: Mod assist;+2 safety/equipment;From elevated surface Stand pivot transfers: Mod assist;+2 safety/equipment;From elevated surface            Balance Overall balance assessment: Needs assistance Sitting-balance support: Single extremity supported;Feet supported Sitting balance-Leahy Scale: Good     Standing balance  support: Bilateral upper extremity supported Standing balance-Leahy Scale: Poor                             ADL either performed or assessed with clinical judgement   ADL Overall ADL's : Needs assistance/impaired Eating/Feeding: Set up;Sitting   Grooming: Set up;Sitting   Upper Body Bathing: Set up;Sitting   Lower Body Bathing: Maximal assistance;Sit to/from stand;Cueing for safety;Cueing for compensatory techniques;Cueing for sequencing;Sitting/lateral leans;+2 for safety/equipment   Upper Body Dressing : Set up;Sitting   Lower Body Dressing: +2 for safety/equipment;Maximal assistance;Cueing for compensatory techniques;Cueing for safety;Sit to/from stand;Sitting/lateral leans   Toilet Transfer: Moderate assistance;Stand-pivot;Cueing for safety;Cueing for sequencing;RW Toilet Transfer Details (indicate cue type and reason): stand pivot maintaining NWB Toileting- Clothing Manipulation and Hygiene: Maximal assistance;Sit to/from stand;Sitting/lateral lean;Cueing for safety;Cueing for sequencing;Cueing for compensatory techniques         General ADL Comments: Pt lives with husband and is hopefulher daughter can A as needed     Vision Baseline Vision/History: Glaucoma Patient Visual Report: No change from baseline              Pertinent Vitals/Pain Pain Assessment: 0-10 Pain Score: 8  Pain Location: R hip with transition to sit EOB Pain Descriptors / Indicators: Guarding;Discomfort Pain Intervention(s): Limited activity within patient's tolerance;Repositioned;Patient requesting pain meds-RN notified;RN gave pain meds during session;Monitored during session     Hand Dominance     Extremity/Trunk Assessment Upper Extremity Assessment Upper Extremity Assessment: Generalized weakness           Communication Communication Communication: No difficulties  Cognition Arousal/Alertness: Awake/alert Behavior During Therapy: WFL for tasks  assessed/performed Overall Cognitive Status: Within Functional Limits for tasks assessed                                 General Comments: pt is HOH   General Comments               Home Living Family/patient expects to be discharged to:: Unsure Living Arrangements: Spouse/significant other;Children Available Help at Discharge: Family Type of Home: House Home Access: Ramped entrance     [place]: One level     Bathroom Shower/Tub: Teacher, early years/pre: Handicapped height Bathroom Accessibility: Yes   Home Equipment: Environmental consultant - 2 wheels          Prior Functioning/Environment Level of Independence: Independent                 OT Problem List: Decreased strength;Decreased activity tolerance;Impaired balance (sitting and/or standing);Decreased safety awareness;Decreased knowledge of use of DME or AE;Decreased knowledge of precautions;Pain      OT Treatment/Interventions: Self-care/ADL training;Patient/family education;Therapeutic activities;DME and/or AE instruction    OT Goals(Current goals can be found in the care plan section) Acute Rehab OT Goals Patient Stated Goal: home with help OT Goal Formulation: With patient Time For Goal Achievement: 08/13/19 Potential to Achieve Goals: Good  OT Frequency: Min 2X/week              AM-PAC OT "6 Clicks" Daily Activity     Outcome Measure Help from another person eating meals?: None Help from another person taking care of personal grooming?: A Little Help from another person toileting, which includes using toliet, bedpan, or urinal?: A Lot Help from another person bathing (including washing, rinsing, drying)?: A Lot Help from another person to put on and taking off regular upper body clothing?: A Little Help from another person to put on and taking off regular lower body clothing?: A Lot 6 Click Score: 16   End of Session Equipment Utilized During Treatment: Rolling walker Nurse  Communication: Mobility status  Activity Tolerance: Patient tolerated treatment well Patient left: in chair;with call bell/phone within reach;with nursing/sitter in room;with chair alarm set  OT Visit Diagnosis: Unsteadiness on feet (R26.81);Other abnormalities of gait and mobility (R26.89);Repeated falls (R29.6);Muscle weakness (generalized) (M62.81);History of falling (Z91.81)                Time: [id number] OT Time Calculation (min): 27 min Charges:  OT General Charges $OT Visit: 1 Visit OT Evaluation $OT Eval Moderate Complexity: 1 Mod  Kari Baars, [place] [phone] Office- [phone]     Huldah Marin, Edwena Felty D 08/06/2019, 11:39 AM

## 2019-08-06 NOTE — Evaluation (Signed)
Physical Therapy Evaluation Patient Details Name: Mackenzie Madden MRN: [id number] DOB: 1927-10-05 Today's Date: 08/06/2019   History of Present Illness  Patient is a 85 year old female with history of hypertension, glaucoma, hypothyroidism, prior history of pubic ramus fracture, multiple falls (4) this past year per patient presented to the ED after mechanical fall.  Work-up noted a right subcapital femoral fracture with mild impaction.  Pt had CANNULATED HIP PINNING (Right) on 08/04/3029 adn per MD and will be NWB for 4 weeks  Clinical Impression   Pt is very motivated and active 85 yo.  She did well maintaining NWB with transfers.  Pt did not want to go to SNF.  Later spoke with daughter who reports she has several able bodied family members (including pts husband who is 19 but plays golf frequently) who can provide 24 hr assist.  If going home recommend w/c - family reports there is one from their church that they will get.  Also, hospital bed would be beneficial to improve transfer ability.  Pt has other needed DME and level entry home.  Pt presents with impairments below (see PT problem list) and will benefit from PT to address while hospitalized and with HHPT.     Follow Up Recommendations Home health PT    Equipment Recommendations  Hospital bed    Recommendations for Other Services       Precautions / Restrictions Precautions Precautions: Fall Restrictions Weight Bearing Restrictions: Yes RLE Weight Bearing: Non weight bearing      Mobility  Bed Mobility Overal bed mobility: Needs Assistance Bed Mobility: Supine to Sit     Supine to sit: Max assist     General bed mobility comments: increased pain w transfer  Transfers Overall transfer level: Needs assistance Equipment used: Rolling walker (2 wheeled) Transfers: Sit to/from [facility] Sit to Stand: Mod assist;+2 safety/equipment;From elevated surface Stand pivot transfers: Mod assist;+2  safety/equipment;From elevated surface       General transfer comment: used RW; did well with NWB ; Discussed transfer techniques (hopping with RW, or pivots) with daughter as she was not present when PT performed eval.  Daughter reports she is comfortable with this as pt is small and they assisted her last year when she broke her pelvis.   Ambulation/Gait Ambulation/Gait assistance: Mod assist;+2 physical assistance Gait Distance (Feet): 1 Feet Assistive device: Rolling walker (2 wheeled)       General Gait Details: hopped to chair with cues for RW, did well NWB, fatigued easily  Stairs            Wheelchair Mobility    Modified Rankin (Stroke Patients Only)       Balance Overall balance assessment: Needs assistance Sitting-balance support: Single extremity supported;Feet supported Sitting balance-Leahy Scale: Good     Standing balance support: Bilateral upper extremity supported Standing balance-Leahy Scale: Poor                               Pertinent Vitals/Pain Pain Assessment: 0-10 Pain Score: 8  Pain Location: R hip with transition to sit EOB Pain Descriptors / Indicators: Guarding;Discomfort Pain Intervention(s): RN gave pain meds during session;Ice applied;Repositioned    Home Living Family/patient expects to be discharged to:: Home Living Arrangements: Spouse/significant other;Children Available Help at Discharge: Family Type of Home: House Home Access: Ramped entrance     Home Layout: One level Home Equipment: Environmental consultant - 2 wheels;Shower seat;Bedside commode  Prior Function Level of Independence: Independent         Comments: Pt used RW for ambulation in home and community; could perform ADLs     Hand Dominance        Extremity/Trunk Assessment   Upper Extremity Assessment Upper Extremity Assessment: Defer to OT evaluation    Lower Extremity Assessment Lower Extremity Assessment: RLE deficits/detail RLE Deficits /  Details: ROM: WFL but less than L, limted by pain; MMT: Ankle DF 5/5, Knee and hip 1/5       Communication   Communication: HOH  Cognition Arousal/Alertness: Awake/alert Behavior During Therapy: WFL for tasks assessed/performed Overall Cognitive Status: Within Functional Limits for tasks assessed                                 General Comments: pt is [facility]      General Comments      Exercises     Assessment/Plan    PT Assessment Patient needs continued PT services  PT Problem List Decreased strength;Decreased mobility;Decreased safety awareness;Decreased range of motion;Decreased coordination;Decreased activity tolerance;Decreased balance       PT Treatment Interventions DME instruction;Therapeutic exercise;Gait training;Balance training;Modalities;Functional mobility training;Therapeutic activities;Patient/family education    PT Goals (Current goals can be found in the Care Plan section)  Acute Rehab PT Goals Patient Stated Goal: home with help does not want SNF PT Goal Formulation: With patient/family Time For Goal Achievement: 08/20/19 Potential to Achieve Goals: Good Additional Goals Additional Goal #1: Will increase R LE strength to 3/5 to assist with transfers    Frequency Min 5X/week   Barriers to discharge   NWB    Co-evaluation               AM-PAC PT "6 Clicks" Mobility  Outcome Measure Help needed turning from your back to your side while in a flat bed without using bedrails?: Total Help needed moving from lying on your back to sitting on the side of a flat bed without using bedrails?: Total Help needed moving to and from a bed to a chair (including a wheelchair)?: A Lot Help needed standing up from a chair using your arms (e.g., wheelchair or bedside chair)?: A Lot Help needed to walk in hospital room?: A Lot Help needed climbing 3-5 steps with a railing? : Total 6 Click Score: 9    End of Session Equipment Utilized During  Treatment: Gait belt Activity Tolerance: Patient tolerated treatment well Patient left: in chair;with chair alarm set;with call bell/phone within reach Nurse Communication: Mobility status PT Visit Diagnosis: Unsteadiness on feet (R26.81);Other abnormalities of gait and mobility (R26.89);Muscle weakness (generalized) (M62.81);History of falling (Z91.81)    Time: 1000-1020 PT Time Calculation (min) (ACUTE ONLY): 20 min   Charges:   PT Evaluation $PT Eval Moderate Complexity: 1 Mod          Maggie Font, PT Acute Rehab Services [phone]   Karlton Lemon 08/06/2019, 11:55 AM

## 2019-08-07 LAB — BASIC METABOLIC PANEL
Anion gap: 7 (ref 5–15)
BUN: 11 mg/dL (ref 8–23)
CO2: 24 mmol/L (ref 22–32)
Calcium: 7.9 mg/dL — ABNORMAL LOW (ref 8.9–10.3)
Chloride: 102 mmol/L (ref 98–111)
Creatinine, Ser: 0.66 mg/dL (ref 0.44–1.00)
GFR calc Af Amer: >60 mL/min (ref >60)
GFR calc non Af Amer: >60 mL/min (ref >60)
Glucose, Bld: 124 mg/dL — ABNORMAL HIGH (ref 70–99)
Potassium: 3.5 mmol/L (ref 3.5–5.1)
Sodium: 133 mmol/L — ABNORMAL LOW (ref 135–145)

## 2019-08-07 LAB — CBC
HCT: 31.3 % — ABNORMAL LOW (ref 36.0–46.0)
Hemoglobin: 10.1 g/dL — ABNORMAL LOW (ref 12.0–15.0)
MCH: 31.9 pg (ref 26.0–34.0)
MCHC: 32.3 g/dL (ref 30.0–36.0)
MCV: 98.7 fL (ref 80.0–100.0)
Platelets: 145 10*3/uL — ABNORMAL LOW (ref 150–400)
RBC: 3.17 MIL/uL — ABNORMAL LOW (ref 3.87–5.11)
RDW: 14.5 % (ref 11.5–15.5)
WBC: 8.3 10*3/uL (ref 4.0–10.5)
nRBC: 0 % (ref 0.0–0.2)

## 2019-08-07 NOTE — Plan of Care (Signed)
  Problem: Education: Goal: Knowledge of General Education information will improve Description: Including pain rating scale, medication(s)/side effects and non-pharmacologic comfort measures Outcome: Progressing   Problem: Clinical Measurements: Goal: Will remain free from infection Outcome: Progressing Goal: Respiratory complications will improve Outcome: Progressing Goal: Cardiovascular complication will be avoided Outcome: Progressing   Problem: Activity: Goal: Risk for activity intolerance will decrease Outcome: Progressing   Problem: Nutrition: Goal: Adequate nutrition will be maintained Outcome: Progressing   Problem: Elimination: Goal: Will not experience complications related to urinary retention Outcome: Progressing   Problem: Safety: Goal: Ability to remain free from injury will improve Outcome: Progressing

## 2019-08-07 NOTE — Plan of Care (Signed)
Patient discharged home with daughter

## 2019-08-07 NOTE — Care Management Important Message (Signed)
Important Message  Patient Details IM Letter given to Velva Harman RN to present to the Patient Name: Mackenzie Madden MRN: [id number] Date of Birth: [birth year]   Medicare Important Message Given:  Yes     Kerin Salen 08/07/2019, 1:26 PM

## 2019-08-07 NOTE — Progress Notes (Signed)
Physical Therapy Treatment Patient Details Name: Mackenzie Madden MRN: [id number] DOB: 02-06-1928 Today's Date: 08/07/2019    History of Present Illness Patient is a 85 year old female with history of hypertension, glaucoma, hypothyroidism, prior history of pubic ramus fracture, multiple falls (4) this past year per patient presented to the ED after mechanical fall.  Work-up noted a right subcapital femoral fracture with mild impaction.  Pt had CANNULATED HIP PINNING (Right) on 08/04/3029 adn per MD and will be NWB for 4 weeks    PT Comments    Patient received up in chair with family member present who observed entirety of session. Able to perform sit to stand with MinAx2 and heavy cues for hand placement and sequencing, then tolerated progression of gait training to 47f today with 2 person assist for balance/safety (MinAx1, ModAx1) with chair follow performed by family member. Able to effectively maintain R LE NWB during all mobility today.  Easily fatigued but tolerated progression of activity well today. Educated family to only perform transfers with patient at home and let therapy focus on and improve ambulation, however. She was left up in the chair with all needs met, family present, and chair alarm active this afternoon.     Follow Up Recommendations  Home health PT     Equipment Recommendations  Hospital bed    Recommendations for Other Services       Precautions / Restrictions Precautions Precautions: Fall Restrictions Weight Bearing Restrictions: Yes RLE Weight Bearing: Non weight bearing    Mobility      General bed mobility comments: OOB in chair  Transfers Overall transfer level: Needs assistance Equipment used: Rolling walker (2 wheeled) Transfers: Sit to/from Stand Sit to Stand: Min assist;+2 physical assistance Stand pivot transfers: Mod assist;From elevated surface;Min assist       General transfer comment: MinAx2 for safety and steadying, did very  well with keeping R LE NWB, heavy cues for hand placement  Ambulation/Gait Ambulation/Gait assistance: Min assist;Mod assist Gait Distance (Feet): 8 Feet Assistive device: Rolling walker (2 wheeled) Gait Pattern/deviations: Step-to pattern;Antalgic Gait velocity: decreased   General Gait Details: hop to pattern with RW, able to keep weight off of R LE well but easily fatigued, MinAx1 and ModAx1 (2 person assist) for balance, family member followed with chair for safety   Stairs             Wheelchair Mobility    Modified Rankin (Stroke Patients Only)       Balance Overall balance assessment: Needs assistance Sitting-balance support: Feet supported;Bilateral upper extremity supported Sitting balance-Leahy Scale: Normal     Standing balance support: Bilateral upper extremity supported;During functional activity Standing balance-Leahy Scale: Poor                              Cognition Arousal/Alertness: Awake/alert Behavior During Therapy: WFL for tasks assessed/performed Overall Cognitive Status: Within Functional Limits for tasks assessed                                 [facility]     Exercises      General Comments        Pertinent Vitals/Pain Pain Assessment: Faces Pain Score: 4  Faces Pain Scale: Hurts little more Pain Location: R hip with movement in general Pain Descriptors / Indicators: Guarding;Discomfort Pain Intervention(s): Limited activity within patient's tolerance;Monitored during  session    Home Living                      Prior Function            PT Goals (current goals can now be found in the care plan section) Acute Rehab PT Goals Patient Stated Goal: home with help does not want SNF PT Goal Formulation: With patient/family Time For Goal Achievement: 08/20/19 Potential to Achieve Goals: Good Additional Goals Additional Goal #1: Will increase R LE strength to 3/5 to assist  with transfers Progress towards PT goals: Progressing toward goals    Frequency    Min 5X/week      PT Plan Current plan remains appropriate    Co-evaluation              AM-PAC PT "6 Clicks" Mobility   Outcome Measure  Help needed turning from your back to your side while in a flat bed without using bedrails?: A Lot Help needed moving from lying on your back to sitting on the side of a flat bed without using bedrails?: A Lot Help needed moving to and from a bed to a chair (including a wheelchair)?: A Little Help needed standing up from a chair using your arms (e.g., wheelchair or bedside chair)?: A Little Help needed to walk in hospital room?: A Little Help needed climbing 3-5 steps with a railing? : Total 6 Click Score: 14    End of Session Equipment Utilized During Treatment: Gait belt Activity Tolerance: Patient tolerated treatment well Patient left: in chair;with chair alarm set;with call bell/phone within reach;with family/visitor present   PT Visit Diagnosis: Unsteadiness on feet (R26.81);Other abnormalities of gait and mobility (R26.89);Muscle weakness (generalized) (M62.81);History of falling (Z91.81)     Time: 1222-4114 PT Time Calculation (min) (ACUTE ONLY): 15 min  Charges:  $Gait Training: 8-22 mins                     Windell Norfolk, DPT, CBIS  Supplemental Physical Therapist [place]    Pager [phone] Acute Rehab Office [phone]

## 2019-08-07 NOTE — Progress Notes (Signed)
Occupational Therapy Treatment Patient Details Name: Mackenzie Madden MRN: [id number] DOB: 12/04/1927 Today's Date: 08/07/2019    History of present illness Patient is a 85 year old female with history of hypertension, glaucoma, hypothyroidism, prior history of pubic ramus fracture, multiple falls (4) this past year per patient presented to the ED after mechanical fall.  Work-up noted a right subcapital femoral fracture with mild impaction.  Pt had CANNULATED HIP PINNING (Right) on 08/04/3029 adn per MD and will be NWB for 4 weeks   OT comments  Daughter present for OT session is is prepared A pt at home  Follow Up Recommendations  Home health OT;SNF;Supervision/Assistance - 24 hour(depending on care at home)    [place] Hospital bed    Recommendations for Other Services      Precautions / Restrictions Restrictions Weight Bearing Restrictions: Yes RLE Weight Bearing: Non weight bearing       Mobility Bed Mobility Overal bed mobility: Needs Assistance Bed Mobility: Supine to Sit     Supine to sit: Min assist        Transfers Overall transfer level: Needs assistance Equipment used: Rolling walker (2 wheeled) Transfers: Sit to/from [facility] Sit to Stand: Mod assist;From elevated surface;Min assist Stand pivot transfers: Mod assist;From elevated surface;Min assist       General transfer comment: used RW; did well with NWB    Balance Overall balance assessment: Needs assistance Sitting-balance support: Single extremity supported;Feet supported Sitting balance-Leahy Scale: Good     Standing balance support: Bilateral upper extremity supported Standing balance-Leahy Scale: Poor                             ADL either performed or assessed with clinical judgement   ADL Overall ADL's : Needs assistance/impaired             Lower Body Bathing: Moderate assistance;Sit to/from stand;Cueing for compensatory  techniques;Cueing for safety       Lower Body Dressing: Moderate assistance;Sit to/from stand;Cueing for compensatory techniques;Cueing for safety;Cueing for sequencing   Toilet Transfer: RW;Moderate assistance;Stand-pivot;Cueing for safety;Cueing for sequencing;BSC   Toileting- Clothing Manipulation and Hygiene: Minimal assistance;Sit to/from stand;Cueing for safety;Cueing for sequencing       Functional mobility during ADLs: Minimal assistance;Moderate assistance General ADL Comments: Daugther present for OT tx session and is prepated to A pt at home.  Pt did well with NWB and verbalized understanding of transfers only               Cognition Arousal/Alertness: Awake/alert Behavior During Therapy: WFL for tasks assessed/performed Overall Cognitive Status: Within Functional Limits for tasks assessed                                                     Pertinent Vitals/ Pain       Pain Assessment: 0-10 Pain Score: 4  Pain Location: R hip with transition to sit EOB Pain Descriptors / Indicators: Guarding;Discomfort Pain Intervention(s): Limited activity within patient's tolerance;Repositioned         Frequency  Min 2X/week        Progress Toward Goals  OT Goals(current goals can now be found in the care plan section)  Progress towards OT goals: Progressing toward goals     Plan Discharge plan remains appropriate  AM-PAC OT "6 Clicks" Daily Activity     Outcome Measure   Help from another person eating meals?: None Help from another person taking care of personal grooming?: A Little Help from another person toileting, which includes using toliet, bedpan, or urinal?: A Lot Help from another person bathing (including washing, rinsing, drying)?: A Lot Help from another person to put on and taking off regular upper body clothing?: A Little Help from another person to put on and taking off regular lower body clothing?: A Lot 6 Click  Score: 16    End of Session Equipment Utilized During Treatment: Rolling walker  OT Visit Diagnosis: Unsteadiness on feet (R26.81);Other abnormalities of gait and mobility (R26.89);Repeated falls (R29.6);Muscle weakness (generalized) (M62.81);History of falling (Z91.81)   Activity Tolerance Patient tolerated treatment well   Patient Left in chair;with call bell/phone within reach;with nursing/sitter in room;with chair alarm set   Nurse Communication Mobility status        Time: 1005-1031 OT Time Calculation (min): 26 min  Charges: OT General Charges $OT Visit: 1 Visit OT Treatments $Self Care/Home Management : 23-37 mins  Kari Baars, [place] [phone] Office- [place], Edwena Felty D 08/07/2019, 12:33 PM

## 2019-08-19 ENCOUNTER — Ambulatory Visit (INDEPENDENT_AMBULATORY_CARE_PROVIDER_SITE_OTHER): Payer: Medicare Other

## 2019-08-19 ENCOUNTER — Encounter: Payer: Self-pay | Admitting: Orthopaedic Surgery

## 2019-08-19 ENCOUNTER — Ambulatory Visit (INDEPENDENT_AMBULATORY_CARE_PROVIDER_SITE_OTHER): Payer: Medicare Other | Admitting: Orthopaedic Surgery

## 2019-08-19 DIAGNOSIS — S72011A Unspecified intracapsular fracture of right femur, initial encounter for closed fracture: Secondary | ICD-10-CM

## 2019-08-19 NOTE — Progress Notes (Signed)
   Ms. Eppler is now 2 weeks today status post cannulated screw fixation to fix a nondisplaced right hip femoral neck fracture.  She is 85 years old.  She denies any significant hip pain at all.  She has been compliant with nonweightbearing on the right hip.  The staples were removed and Steri-Strips applied to right hip incision with looks good.  Her leg lengths appear equal.  She does tolerate hip compression and range of motion of her right hip with just minimal pain.  X-rays of her right hip shows the cannulated screws are in place and the fracture has not collapsed.  Thus far everything looks good.  She will remain nonweightbearing for 2 more weeks and then can start weightbearing as tolerated starting December 1 on her right hip.  She will go slow.  She will back off of things are hurting too much.  I would like to see her back in 4 weeks from now.  I would like an AP and lateral of her right hip at that visit.  We do not need to see the pelvis just the right hip.  All question concerns were answered and addressed.

## 2019-09-01 ENCOUNTER — Telehealth: Payer: Self-pay

## 2019-09-01 NOTE — Telephone Encounter (Signed)
Mark, physical therapist with [facility] would like verbal orders to extended HHPT for 2 x week for 4 weeks and 1 x week for 2 weeks, starting today.  Cb# is [id number].  Please advise.  Thank you.

## 2019-09-01 NOTE — Telephone Encounter (Signed)
Called in verbal to [place].

## 2019-09-16 ENCOUNTER — Ambulatory Visit (INDEPENDENT_AMBULATORY_CARE_PROVIDER_SITE_OTHER): Payer: Medicare Other | Admitting: Orthopaedic Surgery

## 2019-09-16 ENCOUNTER — Encounter: Payer: Self-pay | Admitting: Orthopaedic Surgery

## 2019-09-16 ENCOUNTER — Other Ambulatory Visit: Payer: Self-pay

## 2019-09-16 ENCOUNTER — Ambulatory Visit (INDEPENDENT_AMBULATORY_CARE_PROVIDER_SITE_OTHER): Payer: Medicare Other

## 2019-09-16 DIAGNOSIS — S72011A Unspecified intracapsular fracture of right femur, initial encounter for closed fracture: Secondary | ICD-10-CM

## 2019-09-16 NOTE — Progress Notes (Signed)
HPI: Mackenzie Madden returns 6 weeks status post cannulated pinning of a right hip fracture.  She is overall doing well.  States her pain is dissipating.  She has no complaints.  She is ambulating with a walker.  Physical exam: Right hip good range of motion without pain.  Calf supple nontender.  Dorsiflexion plantarflexion ankle intact.  Radiographs: AP pelvis lateral view of the right hip: Callus formation seen at the fracture site.  No hardware failure or collapse of the femoral head.  Bilateral hips well located.  Impression: 6 weeks status post right hip fracture cannulated pinning  Plan: She will work on range of motion strengthening the hip.  Follow-up with [place] in 1 month at that time we will obtain an AP pelvis and lateral view of the right hip.  Questions were encouraged and answered

## 2019-10-14 ENCOUNTER — Ambulatory Visit (INDEPENDENT_AMBULATORY_CARE_PROVIDER_SITE_OTHER): Payer: Medicare Other

## 2019-10-14 ENCOUNTER — Ambulatory Visit: Payer: Medicare Other | Admitting: Orthopaedic Surgery

## 2019-10-14 ENCOUNTER — Encounter: Payer: Self-pay | Admitting: Orthopaedic Surgery

## 2019-10-14 ENCOUNTER — Other Ambulatory Visit: Payer: Self-pay

## 2019-10-14 DIAGNOSIS — Z9889 Other specified postprocedural states: Secondary | ICD-10-CM | POA: Diagnosis not present

## 2019-10-14 DIAGNOSIS — S72011A Unspecified intracapsular fracture of right femur, initial encounter for closed fracture: Secondary | ICD-10-CM

## 2019-10-14 NOTE — Progress Notes (Signed)
HPI: [name] returns today 10 weeks status post right hip fracture cannulated pinning.  She is overall doing well.  States that her gait balance still seems a bit off she is using a rolling walker.  She does go without the rolling walker sometimes in her kitchen and uses the wall railing to get around.  She is having some pain in the hip but feels that this is dissipating.  She has no complaints otherwise.   General: Well-developed well-nourished female no acute distress.  Mood and affect appropriate.  Right hip no significant pain with internal or external rotation fluid motion.  Right calf supple nontender.  Ambulates with a rolling walker.  Radiographs: AP pelvis lateral view right hip: No hardware failure or evidence of osteonecrosis.  Fracture appears to be healing with further consolidation.  No other bony abnormalities.  Both hips well located.  Impression: 10 weeks status post right hip fracture cannulated pinning  Plan: Offered formal outpatient therapy for gait and balance she would like to work on this on her own.  She can always call the office let us know if she wants to proceed with any outpatient therapy.  Otherwise she is weightbearing as tolerated.  We will see her back in 6 months at that time obtain an AP and lateral view of the right hip to rule out osteonecrosis.  Questions encouraged and answered.

## 2022-01-02 ENCOUNTER — Encounter (HOSPITAL_BASED_OUTPATIENT_CLINIC_OR_DEPARTMENT_OTHER): Payer: Self-pay

## 2022-01-02 ENCOUNTER — Other Ambulatory Visit: Payer: Self-pay

## 2022-01-02 ENCOUNTER — Emergency Department (HOSPITAL_BASED_OUTPATIENT_CLINIC_OR_DEPARTMENT_OTHER)
Admission: EM | Admit: 2022-01-02 | Discharge: 2022-01-03 | Disposition: A | Discharge status: Home / Self Care | Payer: Medicare Other | Attending: Emergency Medicine | Admitting: Emergency Medicine

## 2022-01-02 ENCOUNTER — Emergency Department (HOSPITAL_BASED_OUTPATIENT_CLINIC_OR_DEPARTMENT_OTHER): Payer: Medicare Other | Admitting: Radiology

## 2022-01-02 DIAGNOSIS — Z7982 Long term (current) use of aspirin: Secondary | ICD-10-CM | POA: Insufficient documentation

## 2022-01-02 DIAGNOSIS — N3000 Acute cystitis without hematuria: Secondary | ICD-10-CM | POA: Insufficient documentation

## 2022-01-02 DIAGNOSIS — D72829 Elevated white blood cell count, unspecified: Secondary | ICD-10-CM | POA: Insufficient documentation

## 2022-01-02 DIAGNOSIS — Z20822 Contact with and (suspected) exposure to covid-19: Secondary | ICD-10-CM | POA: Diagnosis not present

## 2022-01-02 DIAGNOSIS — R300 Dysuria: Secondary | ICD-10-CM | POA: Diagnosis present

## 2022-01-02 LAB — CBC WITH DIFFERENTIAL/PLATELET
Abs Immature Granulocytes: 0.03 10*3/uL (ref 0.00–0.07)
Basophils Absolute: 0 10*3/uL (ref 0.0–0.1)
Basophils Relative: 0 %
Eosinophils Absolute: 0.2 10*3/uL (ref 0.0–0.5)
Eosinophils Relative: 2 %
HCT: 39.1 % (ref 36.0–46.0)
Hemoglobin: 13 g/dL (ref 12.0–15.0)
Immature Granulocytes: 0 %
Lymphocytes Relative: 24 %
Lymphs Abs: 2.4 10*3/uL (ref 0.7–4.0)
MCH: 31.6 pg (ref 26.0–34.0)
MCHC: 33.2 g/dL (ref 30.0–36.0)
MCV: 94.9 fL (ref 80.0–100.0)
Monocytes Absolute: 1 10*3/uL (ref 0.1–1.0)
Monocytes Relative: 10 %
Neutro Abs: 6.3 10*3/uL (ref 1.7–7.7)
Neutrophils Relative %: 64 %
Platelets: 159 10*3/uL (ref 150–400)
RBC: 4.12 MIL/uL (ref 3.87–5.11)
RDW: 13.5 % (ref 11.5–15.5)
WBC: 9.9 10*3/uL (ref 4.0–10.5)
nRBC: 0 % (ref 0.0–0.2)

## 2022-01-02 LAB — BASIC METABOLIC PANEL
Anion gap: 6 (ref 5–15)
BUN: 15 mg/dL (ref 8–23)
CO2: 27 mmol/L (ref 22–32)
Calcium: 8.9 mg/dL (ref 8.9–10.3)
Chloride: 100 mmol/L (ref 98–111)
Creatinine, Ser: 0.77 mg/dL (ref 0.44–1.00)
GFR, Estimated: >60 mL/min (ref >60)
Glucose, Bld: 105 mg/dL — ABNORMAL HIGH (ref 70–99)
Potassium: 3.7 mmol/L (ref 3.5–5.1)
Sodium: 133 mmol/L — ABNORMAL LOW (ref 135–145)

## 2022-01-02 LAB — URINALYSIS, ROUTINE W REFLEX MICROSCOPIC
Bilirubin Urine: NEGATIVE
Glucose, UA: NEGATIVE mg/dL
Hgb urine dipstick: NEGATIVE
Ketones, ur: NEGATIVE mg/dL
Nitrite: POSITIVE — AB
Protein, ur: NEGATIVE mg/dL
Specific Gravity, Urine: 1.005 (ref 1.005–1.030)
WBC, UA: >50 WBC/hpf — ABNORMAL HIGH (ref 0–5)
pH: 7 (ref 5.0–8.0)

## 2022-01-02 LAB — RESP PANEL BY RT-PCR (FLU A&B, COVID) ARPGX2
Influenza A by PCR: NEGATIVE
Influenza B by PCR: NEGATIVE
SARS Coronavirus 2 by RT PCR: NEGATIVE

## 2022-01-02 LAB — LACTIC ACID, PLASMA: Lactic Acid, Venous: 1.3 mmol/L (ref 0.5–1.9)

## 2022-01-02 MED ORDER — SODIUM CHLORIDE 0.9 % IV SOLN
1.0000 g | Freq: Once | INTRAVENOUS | Status: AC
  Administered 2022-01-02: 1 g via INTRAVENOUS
  Filled 2022-01-02: qty 10

## 2022-01-02 MED ORDER — CEPHALEXIN 500 MG PO CAPS: 500.0000 mg | ORAL_CAPSULE | Freq: Two times a day (BID) | ORAL | 0 refills | Status: DC

## 2022-01-02 NOTE — ED Triage Notes (Addendum)
Pt presents POV with her daughter. ? ?Pt's daughter took pt to UC earlier today due to recent burning with urination and confusion off and on since Friday. ? ?Recent fever 101 and 3 episodes of vomiting last pm. ? ?Denies diarrhea or shortness of breath or pain. ? ?Pt alert and oriented x4 during triage, in NAD.  ? ?Bruise noted to left chin, pt's states this is due to recent dental work/tooth pulled. ?

## 2022-01-02 NOTE — ED Provider Notes (Addendum)
[facility] EMERGENCY DEPT Provider Note   CSN: [id number] Arrival date & time: 01/02/22  2054     History  Chief Complaint  Patient presents with   Dysuria    Mackenzie Madden is a 88 y.o. female.  Patient is a 88 year old female who presents with possible pyelonephritis.  She is here with 2 daughters.  Her daughter states that she has had some complaints of burning on urination off and on.  She has had some increased confusion intermittently over the last several days.  Today she went to an urgent care to have her urine checked and she was diagnosed with a urinary tract infection she was given a prescription for Cipro and was told however to come here because she had a fever of 100.3 there.  She had some vomiting last night but has not had any today.  She has been eating and drinking normally.  She denies abdominal pain.  She denies any current back pain.  She has had a little bit of coughing and congestion.      Home Medications Prior to Admission medications   Medication Sig Start Date End Date Taking? Authorizing Provider  cephALEXin (KEFLEX) 500 MG capsule Take 1 capsule (500 mg total) by mouth 2 (two) times daily. 01/02/22  Yes Rolan Bucco, MD  acetaminophen (TYLENOL) 325 MG tablet Take 1-2 tablets (325-650 mg total) by mouth every 6 (six) hours as needed for mild pain (pain score 1-3 or temp > 100.5). 08/06/19   Alwyn Ren, MD  aspirin EC 325 MG EC tablet Take 1 tablet (325 mg total) by mouth daily with breakfast. 08/07/19   Alwyn Ren, MD  brimonidine-timolol (COMBIGAN) 0.2-0.5 % ophthalmic solution Place 1 drop into the left eye every 12 (twelve) hours.     [provider]  Calcium 250 MG CAPS Take 250 mg by mouth daily.    [provider]  carvedilol (COREG) 3.125 MG tablet Take 1 tablet (3.125 mg total) by mouth 2 (two) times daily with a meal. 08/06/19   Alwyn Ren, MD  docusate sodium (COLACE) 100 MG capsule  Take 1 capsule (100 mg total) by mouth 2 (two) times daily. 08/06/19   Alwyn Ren, MD  HYDROcodone-acetaminophen (NORCO) 7.5-325 MG tablet Take 1-2 tablets by mouth every 4 (four) hours as needed for severe pain (pain score 7-10). 08/06/19   Alwyn Ren, MD  latanoprost (XALATAN) 0.005 % ophthalmic solution Place 1 drop into both eyes at bedtime. 01/11/15   [provider]  levothyroxine (SYNTHROID) 88 MCG tablet Take 88 mcg by mouth daily before breakfast.     [provider]  Multiple Vitamin (MULTIVITAMIN WITH MINERALS) TABS tablet Take 1 tablet by mouth daily.    [provider]  naproxen sodium (ANAPROX) 220 MG tablet Take 220 mg by mouth daily as needed (pain). ALEVE    [provider]  omeprazole (PRILOSEC) 20 MG capsule Take 1 capsule (20 mg total) by mouth daily before breakfast. 30 minutes before 11/15/18   Iva Boop, MD  ondansetron (ZOFRAN) 4 MG tablet Take 1 tablet (4 mg total) by mouth every 6 (six) hours as needed for nausea. 08/06/19   Alwyn Ren, MD  polyethylene glycol (MIRALAX / GLYCOLAX) 17 g packet Take 17 g by mouth daily as needed for mild constipation. 08/06/19   Alwyn Ren, MD  senna (SENOKOT) 8.6 MG TABS tablet Take 1 tablet (8.6 mg total) by mouth 2 (two) times  daily. 08/06/19   Alwyn Ren, MD  valsartan (DIOVAN) 40 MG tablet Take 40 mg by mouth daily as needed. If bp over 150 to take 40mg . 07/31/19   [provider]      Allergies    Patient has no known allergies.    Review of Systems   Review of Systems  Constitutional:  Positive for fever. Negative for chills, diaphoresis and fatigue.  HENT:  Positive for congestion. Negative for rhinorrhea and sneezing.   Eyes: Negative.   Respiratory:  Positive for cough. Negative for chest tightness and shortness of breath.   Cardiovascular:  Negative for chest pain and leg swelling.  Gastrointestinal:  Positive for nausea and  vomiting. Negative for abdominal pain, blood in stool and diarrhea.  Genitourinary:  Positive for dysuria. Negative for difficulty urinating, flank pain, frequency and hematuria.  Musculoskeletal:  Negative for arthralgias and back pain.  Skin:  Negative for rash.  Neurological:  Negative for dizziness, speech difficulty, weakness, numbness and headaches.  Psychiatric/Behavioral:  Positive for confusion.    Physical Exam Updated Vital Signs BP (!) 178/75   Pulse 68   Temp 97.6 F (36.4 C)   Resp 18   Ht 5' (1.524 m)   Wt 45.4 kg   SpO2 97%   BMI 19.53 kg/m  Physical Exam Constitutional:      Appearance: She is well-developed.  HENT:     Head: Normocephalic and atraumatic.  Eyes:     Pupils: Pupils are equal, round, and reactive to light.  Cardiovascular:     Rate and Rhythm: Normal rate and regular rhythm.     Heart sounds: Normal heart sounds.  Pulmonary:     Effort: Pulmonary effort is normal. No respiratory distress.     Breath sounds: Normal breath sounds. No wheezing or rales.  Chest:     Chest wall: No tenderness.  Abdominal:     General: Bowel sounds are normal.     Palpations: Abdomen is soft.     Tenderness: There is no abdominal tenderness. There is no guarding or rebound.     Comments: No CVA tenderness  Musculoskeletal:        General: Normal range of motion.     Cervical back: Normal range of motion and neck supple.  Lymphadenopathy:     Cervical: No cervical adenopathy.  Skin:    General: Skin is warm and dry.     Findings: No rash.  Neurological:     General: No focal deficit present.     Mental Status: She is alert and oriented to person, place, and time.     Comments: Oriented to person, place, confused to year    ED Results / Procedures / Treatments   Labs (all labs ordered are listed, but only abnormal results are displayed) Labs Reviewed  BASIC METABOLIC PANEL - Abnormal; Notable for the following components:      Result Value   Sodium  133 (*)    Glucose, Bld 105 (*)    All other components within normal limits  URINALYSIS, ROUTINE W REFLEX MICROSCOPIC - Abnormal; Notable for the following components:   Color, Urine COLORLESS (*)    Nitrite POSITIVE (*)    Leukocytes,Ua LARGE (*)    WBC, UA >50 (*)    Bacteria, UA FEW (*)    All other components within normal limits  RESP PANEL BY RT-PCR (FLU A&B, COVID) [id number]  URINE CULTURE  CBC WITH DIFFERENTIAL/PLATELET  LACTIC ACID, PLASMA  EKG EKG Interpretation  Date/Time:  Monday January 02 2022 21:19:43 EDT Ventricular Rate:  72 PR Interval:  195 QRS Duration: 157 QT Interval:  406 QTC Calculation: 445 R Axis:   37 Text Interpretation: Sinus rhythm Left bundle branch block since last tracing no significant change Confirmed by Rolan Bucco [phone]) on 01/02/2022 9:30:56 PM  Radiology DG Chest 2 View  Result Date: 01/02/2022 CLINICAL DATA:  Cough, fever EXAM: CHEST - 2 VIEW COMPARISON:  08/04/2019 FINDINGS: Heart and mediastinal contours are within normal limits. No focal opacities or effusions. No acute bony abnormality. Aortic atherosclerosis. IMPRESSION: No active cardiopulmonary disease. Electronically Signed   By: Charlett Nose M.D.   On: 01/02/2022 23:29    Procedures Procedures    Medications Ordered in ED Medications  cefTRIAXone (ROCEPHIN) 1 g in sodium chloride 0.9 % 100 mL IVPB (0 g Intravenous Stopped 01/02/22 2330)    ED Course/ Medical Decision Making/ A&P                           Medical Decision Making Amount and/or Complexity of Data Reviewed Labs: ordered. Radiology: ordered.  Risk Prescription drug management.   Patient is a 88 year old female who presents with some urinary symptoms and mild confusion.  She had some vomiting yesterday but none today she is afebrile though she did have a fever earlier at urgent care.  She has no associate abdominal pain.  No CVA tenderness.  I reviewed the urinalysis that was done at the urgent care.  It  was positive for leukocyte esterase and nitrate.  Microscopic was not done.  Patient is awaiting labs and chest x-ray due to the cough.  If all these findings are nonconcerning and patient is still doing well, she can likely be discharged with outpatient antibiotics.  She was given dose of Rocephin in the ED.  Her urine was sent for culture.  Dr. Bebe Shaggy to take over care pending lab results as well as chest x-ray findings.  Patient is WBC count is normal.  Her lactate is normal.  BMP is nonconcerning.  Still awaiting chest x-ray and COVID testing.  She was given a prescription for Cipro by the urgent care but I would prefer to put her on a different antibiotic.  I discussed this with the family at bedside.  I sent a prescription in for Keflex and advised the patient not to use the Cipro.  Urine culture was sent as well.  They were advised to have close follow-up with her PCP.  Return precautions were given.  Final Clinical Impression(s) / ED Diagnoses Final diagnoses:  Acute cystitis without hematuria    Rx / DC Orders ED Discharge Orders          Ordered    cephALEXin (KEFLEX) 500 MG capsule  2 times daily        01/02/22 2331              Rolan Bucco, MD 01/02/22 2315    Rolan Bucco, MD 01/02/22 2335

## 2022-01-05 LAB — URINE CULTURE: Culture: >=100000 — AB

## 2022-01-06 ENCOUNTER — Telehealth: Payer: Self-pay

## 2022-01-06 NOTE — Telephone Encounter (Signed)
Post ED Visit - Positive Culture Follow-up ? ?Culture report reviewed by antimicrobial stewardship pharmacist: ?[facility] Team ?'[x]'$  Reatha Harps, [place].D. ?'[]'$  Heide Guile, Pharm.D., BCPS AQ-ID ?'[]'$  Parks Neptune, Pharm.D., BCPS ?'[]'$  Alycia Rossetti, Pharm.D., BCPS ?'[]'$  [place], Pharm.D., BCPS, AAHIVP ?'[]'$  Legrand Como, Pharm.D., BCPS, AAHIVP ?'[]'$  Salome Arnt, PharmD, BCPS ?'[]'$  Johnnette Gourd, PharmD, BCPS ?'[]'$  Hughes Better, PharmD, BCPS ?'[]'$  Leeroy Cha, PharmD ?'[]'$  Laqueta Linden, PharmD, BCPS ?'[]'$  Albertina Parr, PharmD ? ?[facility] Team ?'[]'$  Leodis Sias, PharmD ?'[]'$  Lindell Spar, PharmD ?'[]'$  Royetta Asal, PharmD ?'[]'$  Graylin Shiver, Rph ?'[]'$  Rema Fendt) Glennon Mac, PharmD ?'[]'$  Arlyn Dunning, PharmD ?'[]'$  Netta Cedars, PharmD ?'[]'$  Dia Sitter, PharmD ?'[]'$  Leone Haven, PharmD ?'[]'$  Gretta Arab, PharmD ?'[]'$  Theodis Shove, PharmD ?'[]'$  Peggyann Juba, PharmD ?'[]'$  Reuel Boom, PharmD ? ? ?Positive urine culture ?Treated with Cephalexin, organism sensitive to the same and no further patient follow-up is required at this time. ? ?Glennon Hamilton ?01/06/2022, 10:36 AM ?  ?

## 2022-01-23 ENCOUNTER — Emergency Department (HOSPITAL_BASED_OUTPATIENT_CLINIC_OR_DEPARTMENT_OTHER): Payer: Medicare Other

## 2022-01-23 ENCOUNTER — Emergency Department (HOSPITAL_BASED_OUTPATIENT_CLINIC_OR_DEPARTMENT_OTHER): Payer: Medicare Other | Admitting: Radiology

## 2022-01-23 ENCOUNTER — Inpatient Hospital Stay (HOSPITAL_BASED_OUTPATIENT_CLINIC_OR_DEPARTMENT_OTHER)
Admission: EM | Admit: 2022-01-23 | Discharge: 2022-01-31 | DRG: 501 | Disposition: A | Discharge status: Rehabilitation Facility | Payer: Medicare Other | Attending: Internal Medicine | Admitting: Internal Medicine

## 2022-01-23 ENCOUNTER — Encounter (HOSPITAL_BASED_OUTPATIENT_CLINIC_OR_DEPARTMENT_OTHER): Payer: Self-pay

## 2022-01-23 ENCOUNTER — Other Ambulatory Visit: Payer: Self-pay

## 2022-01-23 DIAGNOSIS — Z90710 Acquired absence of both cervix and uterus: Secondary | ICD-10-CM

## 2022-01-23 DIAGNOSIS — Z7982 Long term (current) use of aspirin: Secondary | ICD-10-CM

## 2022-01-23 DIAGNOSIS — S61512A Laceration without foreign body of left wrist, initial encounter: Secondary | ICD-10-CM | POA: Diagnosis present

## 2022-01-23 DIAGNOSIS — I10 Essential (primary) hypertension: Secondary | ICD-10-CM | POA: Diagnosis present

## 2022-01-23 DIAGNOSIS — Z9049 Acquired absence of other specified parts of digestive tract: Secondary | ICD-10-CM

## 2022-01-23 DIAGNOSIS — R42 Dizziness and giddiness: Secondary | ICD-10-CM

## 2022-01-23 DIAGNOSIS — H409 Unspecified glaucoma: Secondary | ICD-10-CM | POA: Diagnosis present

## 2022-01-23 DIAGNOSIS — K5900 Constipation, unspecified: Secondary | ICD-10-CM | POA: Diagnosis present

## 2022-01-23 DIAGNOSIS — Y9301 Activity, walking, marching and hiking: Secondary | ICD-10-CM | POA: Diagnosis present

## 2022-01-23 DIAGNOSIS — Z8249 Family history of ischemic heart disease and other diseases of the circulatory system: Secondary | ICD-10-CM

## 2022-01-23 DIAGNOSIS — Y92009 Unspecified place in unspecified non-institutional (private) residence as the place of occurrence of the external cause: Secondary | ICD-10-CM

## 2022-01-23 DIAGNOSIS — W19XXXA Unspecified fall, initial encounter: Secondary | ICD-10-CM | POA: Diagnosis present

## 2022-01-23 DIAGNOSIS — N390 Urinary tract infection, site not specified: Secondary | ICD-10-CM | POA: Diagnosis present

## 2022-01-23 DIAGNOSIS — R7401 Elevation of levels of liver transaminase levels: Secondary | ICD-10-CM | POA: Diagnosis present

## 2022-01-23 DIAGNOSIS — S52572B Other intraarticular fracture of lower end of left radius, initial encounter for open fracture type I or II: Secondary | ICD-10-CM | POA: Diagnosis not present

## 2022-01-23 DIAGNOSIS — Z79899 Other long term (current) drug therapy: Secondary | ICD-10-CM

## 2022-01-23 DIAGNOSIS — E039 Hypothyroidism, unspecified: Secondary | ICD-10-CM | POA: Diagnosis present

## 2022-01-23 DIAGNOSIS — E222 Syndrome of inappropriate secretion of antidiuretic hormone: Secondary | ICD-10-CM | POA: Diagnosis present

## 2022-01-23 DIAGNOSIS — R7989 Other specified abnormal findings of blood chemistry: Secondary | ICD-10-CM | POA: Diagnosis present

## 2022-01-23 DIAGNOSIS — B9620 Unspecified Escherichia coli [E. coli] as the cause of diseases classified elsewhere: Secondary | ICD-10-CM | POA: Diagnosis present

## 2022-01-23 DIAGNOSIS — W1839XA Other fall on same level, initial encounter: Secondary | ICD-10-CM | POA: Diagnosis present

## 2022-01-23 DIAGNOSIS — S62102B Fracture of unspecified carpal bone, left wrist, initial encounter for open fracture: Principal | ICD-10-CM | POA: Insufficient documentation

## 2022-01-23 DIAGNOSIS — S52602B Unspecified fracture of lower end of left ulna, initial encounter for open fracture type I or II: Secondary | ICD-10-CM | POA: Diagnosis present

## 2022-01-23 DIAGNOSIS — I350 Nonrheumatic aortic (valve) stenosis: Secondary | ICD-10-CM | POA: Diagnosis present

## 2022-01-23 DIAGNOSIS — Z9081 Acquired absence of spleen: Secondary | ICD-10-CM

## 2022-01-23 DIAGNOSIS — Z79890 Hormone replacement therapy (postmenopausal): Secondary | ICD-10-CM

## 2022-01-23 HISTORY — DX: Hypothyroidism, unspecified: E03.9

## 2022-01-23 HISTORY — DX: Unspecified hearing loss, unspecified ear: H91.90

## 2022-01-23 LAB — CBC
HCT: 40.2 % (ref 36.0–46.0)
Hemoglobin: 13.2 g/dL (ref 12.0–15.0)
MCH: 31.5 pg (ref 26.0–34.0)
MCHC: 32.8 g/dL (ref 30.0–36.0)
MCV: 95.9 fL (ref 80.0–100.0)
Platelets: 145 10*3/uL — ABNORMAL LOW (ref 150–400)
RBC: 4.19 MIL/uL (ref 3.87–5.11)
RDW: 13.9 % (ref 11.5–15.5)
WBC: 9 10*3/uL (ref 4.0–10.5)
nRBC: 0 % (ref 0.0–0.2)

## 2022-01-23 LAB — URINALYSIS, ROUTINE W REFLEX MICROSCOPIC
Bilirubin Urine: NEGATIVE
Glucose, UA: NEGATIVE mg/dL
Hgb urine dipstick: NEGATIVE
Ketones, ur: NEGATIVE mg/dL
Nitrite: NEGATIVE
Protein, ur: NEGATIVE mg/dL
Specific Gravity, Urine: 1.005 (ref 1.005–1.030)
pH: 7.5 (ref 5.0–8.0)

## 2022-01-23 LAB — BASIC METABOLIC PANEL
Anion gap: 7 (ref 5–15)
BUN: 12 mg/dL (ref 8–23)
CO2: 26 mmol/L (ref 22–32)
Calcium: 9.8 mg/dL (ref 8.9–10.3)
Chloride: 99 mmol/L (ref 98–111)
Creatinine, Ser: 0.82 mg/dL (ref 0.44–1.00)
GFR, Estimated: >60 mL/min (ref >60)
Glucose, Bld: 130 mg/dL — ABNORMAL HIGH (ref 70–99)
Potassium: 4.8 mmol/L (ref 3.5–5.1)
Sodium: 132 mmol/L — ABNORMAL LOW (ref 135–145)

## 2022-01-23 LAB — CBG MONITORING, ED: Glucose-Capillary: 126 mg/dL — ABNORMAL HIGH (ref 70–99)

## 2022-01-23 MED ORDER — CEFAZOLIN SODIUM-DEXTROSE 1-4 GM/50ML-% IV SOLN
1.0000 g | Freq: Once | INTRAVENOUS | Status: AC
  Administered 2022-01-23: 1 g via INTRAVENOUS
  Filled 2022-01-23: qty 50

## 2022-01-23 MED ORDER — FENTANYL CITRATE PF 50 MCG/ML IJ SOSY
50.0000 ug | PREFILLED_SYRINGE | Freq: Once | INTRAMUSCULAR | Status: AC
  Administered 2022-01-23: 50 ug via INTRAVENOUS
  Filled 2022-01-23: qty 1

## 2022-01-23 MED ORDER — ONDANSETRON HCL 4 MG/2ML IJ SOLN
4.0000 mg | Freq: Once | INTRAMUSCULAR | Status: AC
  Administered 2022-01-23: 4 mg via INTRAVENOUS
  Filled 2022-01-23: qty 2

## 2022-01-23 NOTE — ED Notes (Signed)
Patient given ice pack

## 2022-01-23 NOTE — Progress Notes (Signed)
Transferring facility: [facility] ?Requesting provider: Dr Sherry Ruffing (EDP at [facility]) ?Reason for transfer: admission for further evaluation and management of vertigo leading to ground-level fall and resultant acute open left wrist fracture.  ? ?88 year old female with medical history notable for hypertension, hypothyroidism, who presented to [facility] ED on 01/23/2022 complaining of left wrist pain after experiencing ground-level fall as a consequence of episode of vertigo.  Open left-sided wrist fracture noted upon arrival.  Patient denies any associated sensory deficits.  Appears to be on a daily full dose aspirin, but no additional blood thinners.  She did hit her head as a component of today's ground-level fall but underwent CT head at [facility] ED today showed no evidence of acute process, including no evidence of bleed.  ? ?The patient conveys that she has been experiencing intermittent episodes of vertigo, described as "the whole room is spinning", noting that it was the most recent episode of vertigo earlier today that caused her ground-level fall and left-sided wrist fracture.  Outside of the last 2 weeks, she denies any history of previous vertigo.  Otherwise, no report of acute focal neurologic deficits.  At baseline, she lives at home with her husband, and is able to ambulate with the aid of a walker. ? ?Denies any recent preceding acute respiratory illness, but notes that she was recently diagnosed with a urinary tract infection a few weeks ago, at which time she was experiencing some dysuria.  UTI was treated with antibiotics, with patient reporting resultant resolution of her preceding dysuria.  ? ?Patient confirms that she is up-to-date on her tetanus vaccination. ? ?EDP discussed patient's case with the on-call hand surgeon, Dr. Greta Doom, who requested admission to the hospitalist service at [facility], where he will formally consult and plans to take the patient to the OR in the morning for washout and repair  of her acute open left wrist fracture.  Consequently, he requested the patient be kept n.p.o. after  ?midnight.  In the meantime, he recommends left wrist splint. ? ?As further evaluation of her intermittent episodes of vertigo over the last 2 weeks, EDP ordered MRI of the brain, which is currently pending. ? ?Medications administered prior to transfer included the following: Ancef. ? ?Subsequently, I accepted this patient for transfer for inpatient admission to a med telemetry bed at [facility] for further work-up and management of new onset vertigo with resultant acute open left wrist fracture.  ? ? ? ? ? ?Check [email or web address] for on-call coverage. ?  ?Nursing staff, Please call [facility] number on Amion as soon as patient's arrival, so appropriate admitting provider can evaluate the pt. ? ? ? ? ?Babs Bertin, DO ?Hospitalist  ?

## 2022-01-23 NOTE — ED Triage Notes (Addendum)
Patient here POV from Home for Fall. ? ?Patient fell approximately 1 hour PTA. Endorses ambulating with Walker when she became lightheaded and fell. No Complete LOC. Mild Swelling to Right Wrist. Severe Swelling and Deformity to Left Wrist. Pulses Palpable to Both Upper Extremities Distally.  ? ?Patient fell onto her Left Arm Inside the Home. Hit Head on Floor. No Neck Pain/Tenderness. No Anticoagulants.  ? ?NAD Noted during Triage. A&Ox4. Gcs 15. BIB Wheelchair. ?

## 2022-01-23 NOTE — ED Provider Notes (Signed)
?[facility] ?Provider Note ? ? ?CSN: [id number] ?Arrival date & time: 01/23/22  1845 ? ?  ? ?History ? ?Chief Complaint  ?Patient presents with  ? Fall  ? ? ?Mackenzie Madden is a 88 y.o. female. ? ?The history is provided by the patient and medical records. No language interpreter was used.  ?Fall ?This is a new problem. The current episode started less than 1 hour ago. The problem occurs rarely. Pertinent negatives include no chest pain, no abdominal pain, no headaches and no shortness of breath. Nothing aggravates the symptoms. Nothing relieves the symptoms. She has tried nothing for the symptoms. The treatment provided no relief.  ? ?  ? ?Home Medications ?Prior to Admission medications   ?Medication Sig Start Date End Date Taking? Authorizing Provider  ?acetaminophen (TYLENOL) 325 MG tablet Take 1-2 tablets (325-650 mg total) by mouth every 6 (six) hours as needed for mild pain (pain score 1-3 or temp > 100.5). 08/06/19   Georgette Shell, MD  ?aspirin EC 325 MG EC tablet Take 1 tablet (325 mg total) by mouth daily with breakfast. 08/07/19   Georgette Shell, MD  ?brimonidine-timolol (COMBIGAN) 0.2-0.5 % ophthalmic solution Place 1 drop into the left eye every 12 (twelve) hours.     [provider]  ?Calcium 250 MG CAPS Take 250 mg by mouth daily.    [provider]  ?carvedilol (COREG) 3.125 MG tablet Take 1 tablet (3.125 mg total) by mouth 2 (two) times daily with a meal. 08/06/19   Georgette Shell, MD  ?cephALEXin (KEFLEX) 500 MG capsule Take 1 capsule (500 mg total) by mouth 2 (two) times daily. 01/02/22   Malvin Johns, MD  ?docusate sodium (COLACE) 100 MG capsule Take 1 capsule (100 mg total) by mouth 2 (two) times daily. 08/06/19   Georgette Shell, MD  ?HYDROcodone-acetaminophen (NORCO) 7.5-325 MG tablet Take 1-2 tablets by mouth every 4 (four) hours as needed for severe pain (pain score 7-10). 08/06/19   Georgette Shell, MD  ?latanoprost  (XALATAN) 0.005 % ophthalmic solution Place 1 drop into both eyes at bedtime. 01/11/15   [provider]  ?levothyroxine (SYNTHROID) 88 MCG tablet Take 88 mcg by mouth daily before breakfast.     [provider]  ?Multiple Vitamin (MULTIVITAMIN WITH MINERALS) TABS tablet Take 1 tablet by mouth daily.    [provider]  ?naproxen sodium (ANAPROX) 220 MG tablet Take 220 mg by mouth daily as needed (pain). ALEVE    [provider]  ?omeprazole (PRILOSEC) 20 MG capsule Take 1 capsule (20 mg total) by mouth daily before breakfast. 30 minutes before 11/15/18   Gatha Mayer, MD  ?ondansetron (ZOFRAN) 4 MG tablet Take 1 tablet (4 mg total) by mouth every 6 (six) hours as needed for nausea. 08/06/19   Georgette Shell, MD  ?polyethylene glycol (MIRALAX / GLYCOLAX) 17 g packet Take 17 g by mouth daily as needed for mild constipation. 08/06/19   Georgette Shell, MD  ?senna (SENOKOT) 8.6 MG TABS tablet Take 1 tablet (8.6 mg total) by mouth 2 (two) times daily. 08/06/19   Georgette Shell, MD  ?valsartan (DIOVAN) 40 MG tablet Take 40 mg by mouth daily as needed. If bp over 150 to take '40mg'$ . 07/31/19   [provider]  ?   ? ?Allergies    ?Patient has no known allergies.   ? ?Review of Systems   ?Review of Systems  ?Constitutional:  Negative  for chills, diaphoresis, fatigue and fever.  ?HENT:  Negative for congestion.   ?Eyes:  Negative for visual disturbance.  ?Respiratory:  Negative for chest tightness, shortness of breath, wheezing and stridor.   ?Cardiovascular:  Negative for chest pain, palpitations and leg swelling.  ?Gastrointestinal:  Positive for nausea. Negative for abdominal distention, abdominal pain, constipation, diarrhea and vomiting.  ?Genitourinary:  Negative for dysuria, flank pain and frequency.  ?Musculoskeletal:  Negative for back pain, neck pain and neck stiffness.  ?Skin:  Positive for wound. Negative for rash.  ?Neurological:  Positive for dizziness.  Negative for seizures, syncope, facial asymmetry, speech difficulty, weakness, light-headedness, numbness and headaches.  ?Psychiatric/Behavioral:  Negative for agitation and confusion.   ?All other systems reviewed and are negative. ? ?Physical Exam ?Updated Vital Signs ?BP (!) 183/103 (BP Location: Right Arm)   Pulse 80   Temp 98.5 ?F (36.9 ?C)   Resp 18   Ht 5' (1.524 m)   Wt 45.4 kg   SpO2 100%   BMI 19.55 kg/m?  ?Physical Exam ?Vitals and nursing note reviewed.  ?Constitutional:   ?   General: She is not in acute distress. ?   Appearance: She is well-developed. She is not ill-appearing, toxic-appearing or diaphoretic.  ?HENT:  ?   Head: Normocephalic and atraumatic.  ?   Nose: Nose normal.  ?   Mouth/Throat:  ?   Mouth: Mucous membranes are moist.  ?   Pharynx: No oropharyngeal exudate or posterior oropharyngeal erythema.  ?Eyes:  ?   Extraocular Movements: Extraocular movements intact.  ?   Conjunctiva/sclera: Conjunctivae normal.  ?   Pupils: Pupils are equal, round, and reactive to light.  ?Cardiovascular:  ?   Rate and Rhythm: Normal rate and regular rhythm.  ?   Heart sounds: No murmur heard. ?Pulmonary:  ?   Effort: Pulmonary effort is normal. No respiratory distress.  ?   Breath sounds: Normal breath sounds. No wheezing, rhonchi or rales.  ?Chest:  ?   Chest wall: No tenderness.  ?Abdominal:  ?   General: Abdomen is flat.  ?   Palpations: Abdomen is soft.  ?   Tenderness: There is no abdominal tenderness. There is no right CVA tenderness, left CVA tenderness, guarding or rebound.  ?Musculoskeletal:     ?   General: Tenderness, deformity and signs of injury present. No swelling.  ?   Right wrist: Tenderness present. No swelling, deformity, lacerations or snuff box tenderness. Normal pulse.  ?   Left wrist: Swelling, deformity, laceration (superficial skin tear seen), tenderness and bony tenderness present. No snuff box tenderness. Normal pulse.  ?   Cervical back: Neck supple. No tenderness.  ?    Left lower leg: No edema.  ?Skin: ?   General: Skin is warm and dry.  ?   Capillary Refill: Capillary refill takes less than 2 seconds.  ?   Findings: No erythema or rash.  ?Neurological:  ?   General: No focal deficit present.  ?   Mental Status: She is alert.  ?   Sensory: No sensory deficit.  ?   Motor: No weakness.  ?Psychiatric:     ?   Mood and Affect: Mood normal.  ? ? ?ED Results / Procedures / Treatments   ?Labs ?(all labs ordered are listed, but only abnormal results are displayed) ?Labs Reviewed  ?BASIC METABOLIC PANEL - Abnormal; Notable for the following components:  ?    Result Value  ? Sodium 132 (*)   ?  Glucose, Bld 130 (*)   ? All other components within normal limits  ?CBC - Abnormal; Notable for the following components:  ? Platelets 145 (*)   ? All other components within normal limits  ?URINALYSIS, ROUTINE W REFLEX MICROSCOPIC - Abnormal; Notable for the following components:  ? Leukocytes,Ua MODERATE (*)   ? Bacteria, UA RARE (*)   ? All other components within normal limits  ?CBG MONITORING, ED - Abnormal; Notable for the following components:  ? Glucose-Capillary 126 (*)   ? All other components within normal limits  ?URINE CULTURE  ? ? ?EKG ?EKG Interpretation ? ?Date/Time:  Monday January 23 2022 18:59:05 EDT ?Ventricular Rate:  81 ?PR Interval:  188 ?QRS Duration: 150 ?QT Interval:  394 ?QTC Calculation: 457 ?R Axis:   55 ?Text Interpretation: Normal sinus rhythm Left bundle branch block Abnormal ECG When compared with ECG of 02-Jan-2022 21:19, PREVIOUS ECG IS PRESENT when compared to? prior ECG, similar? LBBB with t wave inversions. No STEMI Confirmed by Antony Blackbird [phone]) on 01/23/2022 7:01:16 PM ? ?Radiology ?DG Wrist 2 Views Left ? ?Result Date: 01/23/2022 ?CLINICAL DATA:  Fall while ambulating with walker at home. Left wrist swelling and deformity. EXAM: LEFT WRIST - 2 VIEW COMPARISON:  None. FINDINGS: Comminuted and displaced distal radius fracture primarily involving the metaphysis.  There is apex radial angulation. Large amount of air in the soft tissues with skin irregularity and possible exposed bone suggesting open fracture. Fracture extends into the distal radioulnar joint. There

## 2022-01-24 ENCOUNTER — Inpatient Hospital Stay (HOSPITAL_COMMUNITY): Payer: Medicare Other | Admitting: General Practice

## 2022-01-24 ENCOUNTER — Encounter (HOSPITAL_COMMUNITY)
Admission: EM | Disposition: A | Discharge status: Rehabilitation Facility | Payer: Self-pay | Source: Home / Self Care | Attending: Internal Medicine

## 2022-01-24 ENCOUNTER — Inpatient Hospital Stay (HOSPITAL_COMMUNITY): Payer: Medicare Other

## 2022-01-24 ENCOUNTER — Encounter (HOSPITAL_COMMUNITY): Payer: Self-pay | Admitting: Internal Medicine

## 2022-01-24 ENCOUNTER — Other Ambulatory Visit: Payer: Self-pay

## 2022-01-24 DIAGNOSIS — S52502A Unspecified fracture of the lower end of left radius, initial encounter for closed fracture: Secondary | ICD-10-CM | POA: Diagnosis not present

## 2022-01-24 DIAGNOSIS — S62102B Fracture of unspecified carpal bone, left wrist, initial encounter for open fracture: Secondary | ICD-10-CM

## 2022-01-24 DIAGNOSIS — R42 Dizziness and giddiness: Secondary | ICD-10-CM | POA: Diagnosis present

## 2022-01-24 DIAGNOSIS — I350 Nonrheumatic aortic (valve) stenosis: Secondary | ICD-10-CM | POA: Diagnosis present

## 2022-01-24 DIAGNOSIS — S52572B Other intraarticular fracture of lower end of left radius, initial encounter for open fracture type I or II: Secondary | ICD-10-CM | POA: Diagnosis present

## 2022-01-24 DIAGNOSIS — R7401 Elevation of levels of liver transaminase levels: Secondary | ICD-10-CM | POA: Diagnosis present

## 2022-01-24 DIAGNOSIS — W19XXXA Unspecified fall, initial encounter: Secondary | ICD-10-CM

## 2022-01-24 DIAGNOSIS — I10 Essential (primary) hypertension: Secondary | ICD-10-CM

## 2022-01-24 DIAGNOSIS — N390 Urinary tract infection, site not specified: Secondary | ICD-10-CM | POA: Diagnosis present

## 2022-01-24 DIAGNOSIS — Z9049 Acquired absence of other specified parts of digestive tract: Secondary | ICD-10-CM | POA: Diagnosis not present

## 2022-01-24 DIAGNOSIS — A499 Bacterial infection, unspecified: Secondary | ICD-10-CM | POA: Diagnosis not present

## 2022-01-24 DIAGNOSIS — R7989 Other specified abnormal findings of blood chemistry: Secondary | ICD-10-CM | POA: Diagnosis present

## 2022-01-24 DIAGNOSIS — Z789 Other specified health status: Secondary | ICD-10-CM | POA: Diagnosis not present

## 2022-01-24 DIAGNOSIS — E039 Hypothyroidism, unspecified: Secondary | ICD-10-CM | POA: Diagnosis present

## 2022-01-24 DIAGNOSIS — E222 Syndrome of inappropriate secretion of antidiuretic hormone: Secondary | ICD-10-CM | POA: Diagnosis present

## 2022-01-24 DIAGNOSIS — Z79899 Other long term (current) drug therapy: Secondary | ICD-10-CM | POA: Diagnosis not present

## 2022-01-24 DIAGNOSIS — S52572D Other intraarticular fracture of lower end of left radius, subsequent encounter for closed fracture with routine healing: Secondary | ICD-10-CM | POA: Diagnosis not present

## 2022-01-24 DIAGNOSIS — Y92009 Unspecified place in unspecified non-institutional (private) residence as the place of occurrence of the external cause: Secondary | ICD-10-CM

## 2022-01-24 DIAGNOSIS — H409 Unspecified glaucoma: Secondary | ICD-10-CM | POA: Diagnosis present

## 2022-01-24 DIAGNOSIS — M25532 Pain in left wrist: Secondary | ICD-10-CM | POA: Diagnosis not present

## 2022-01-24 DIAGNOSIS — K5901 Slow transit constipation: Secondary | ICD-10-CM | POA: Diagnosis not present

## 2022-01-24 DIAGNOSIS — Z9081 Acquired absence of spleen: Secondary | ICD-10-CM | POA: Diagnosis not present

## 2022-01-24 DIAGNOSIS — Y9301 Activity, walking, marching and hiking: Secondary | ICD-10-CM | POA: Diagnosis present

## 2022-01-24 DIAGNOSIS — Z7409 Other reduced mobility: Secondary | ICD-10-CM | POA: Diagnosis not present

## 2022-01-24 DIAGNOSIS — Z7982 Long term (current) use of aspirin: Secondary | ICD-10-CM | POA: Diagnosis not present

## 2022-01-24 DIAGNOSIS — S52602B Unspecified fracture of lower end of left ulna, initial encounter for open fracture type I or II: Secondary | ICD-10-CM | POA: Diagnosis present

## 2022-01-24 DIAGNOSIS — K5900 Constipation, unspecified: Secondary | ICD-10-CM | POA: Diagnosis present

## 2022-01-24 DIAGNOSIS — W1839XA Other fall on same level, initial encounter: Secondary | ICD-10-CM | POA: Diagnosis present

## 2022-01-24 DIAGNOSIS — Z90710 Acquired absence of both cervix and uterus: Secondary | ICD-10-CM | POA: Diagnosis not present

## 2022-01-24 DIAGNOSIS — Z8249 Family history of ischemic heart disease and other diseases of the circulatory system: Secondary | ICD-10-CM | POA: Diagnosis not present

## 2022-01-24 DIAGNOSIS — Z79890 Hormone replacement therapy (postmenopausal): Secondary | ICD-10-CM | POA: Diagnosis not present

## 2022-01-24 DIAGNOSIS — S52579D Other intraarticular fracture of lower end of unspecified radius, subsequent encounter for closed fracture with routine healing: Secondary | ICD-10-CM | POA: Diagnosis not present

## 2022-01-24 DIAGNOSIS — S61512A Laceration without foreign body of left wrist, initial encounter: Secondary | ICD-10-CM | POA: Diagnosis present

## 2022-01-24 DIAGNOSIS — S52502S Unspecified fracture of the lower end of left radius, sequela: Secondary | ICD-10-CM | POA: Diagnosis not present

## 2022-01-24 DIAGNOSIS — B9620 Unspecified Escherichia coli [E. coli] as the cause of diseases classified elsewhere: Secondary | ICD-10-CM | POA: Diagnosis present

## 2022-01-24 HISTORY — PX: ORIF WRIST FRACTURE: SHX2133

## 2022-01-24 HISTORY — PX: CLOSED REDUCTION WRIST FRACTURE: SHX1091

## 2022-01-24 HISTORY — PX: I & D EXTREMITY: SHX5045

## 2022-01-24 LAB — SURGICAL PCR SCREEN
MRSA, PCR: NEGATIVE
Staphylococcus aureus: NEGATIVE

## 2022-01-24 SURGERY — IRRIGATION AND DEBRIDEMENT EXTREMITY
Anesthesia: Monitor Anesthesia Care | Site: Wrist | Laterality: Left

## 2022-01-24 MED ORDER — 0.9 % SODIUM CHLORIDE (POUR BTL) OPTIME
TOPICAL | Status: DC | PRN
  Administered 2022-01-24: 1000 mL

## 2022-01-24 MED ORDER — PHENYLEPHRINE 80 MCG/ML (10ML) SYRINGE FOR IV PUSH (FOR BLOOD PRESSURE SUPPORT)
PREFILLED_SYRINGE | INTRAVENOUS | Status: AC
  Filled 2022-01-24: qty 10

## 2022-01-24 MED ORDER — ONDANSETRON HCL 4 MG PO TABS: 4.0000 mg | ORAL_TABLET | Freq: Four times a day (QID) | ORAL | Status: DC | PRN

## 2022-01-24 MED ORDER — SODIUM CHLORIDE 0.9 % IV SOLN: INTRAVENOUS | Status: DC

## 2022-01-24 MED ORDER — ONDANSETRON HCL 4 MG/2ML IJ SOLN: 4.0000 mg | Freq: Four times a day (QID) | INTRAMUSCULAR | Status: DC | PRN

## 2022-01-24 MED ORDER — CARVEDILOL 3.125 MG PO TABS: 3.1250 mg | ORAL_TABLET | Freq: Two times a day (BID) | ORAL | Status: DC

## 2022-01-24 MED ORDER — CALCIUM CARBONATE 1250 (500 CA) MG PO TABS
1250.0000 mg | ORAL_TABLET | Freq: Every day | ORAL | Status: DC
  Administered 2022-01-25 – 2022-01-31 (×7): 1250 mg via ORAL
  Filled 2022-01-24 (×7): qty 1

## 2022-01-24 MED ORDER — ACETAMINOPHEN 650 MG RE SUPP: 650.0000 mg | Freq: Four times a day (QID) | RECTAL | Status: DC | PRN

## 2022-01-24 MED ORDER — POVIDONE-IODINE 10 % EX SWAB
2.0000 "application " | Freq: Once | CUTANEOUS | Status: AC
  Administered 2022-01-24: 2 via TOPICAL

## 2022-01-24 MED ORDER — POLYETHYLENE GLYCOL 3350 17 G PO PACK
17.0000 g | PACK | Freq: Every day | ORAL | Status: DC | PRN
  Administered 2022-01-31: 17 g via ORAL
  Filled 2022-01-24: qty 1

## 2022-01-24 MED ORDER — ADULT MULTIVITAMIN W/MINERALS CH
1.0000 | ORAL_TABLET | Freq: Every day | ORAL | Status: DC
  Administered 2022-01-25 – 2022-01-31 (×7): 1 via ORAL
  Filled 2022-01-24 (×7): qty 1

## 2022-01-24 MED ORDER — CEFAZOLIN SODIUM-DEXTROSE 1-4 GM/50ML-% IV SOLN
1.0000 g | Freq: Once | INTRAVENOUS | Status: AC
  Administered 2022-01-24: 1 g via INTRAVENOUS
  Filled 2022-01-24: qty 50

## 2022-01-24 MED ORDER — CARVEDILOL 3.125 MG PO TABS
3.1250 mg | ORAL_TABLET | Freq: Once | ORAL | Status: AC
  Administered 2022-01-24: 3.125 mg via ORAL
  Filled 2022-01-24: qty 1

## 2022-01-24 MED ORDER — EPHEDRINE SULFATE (PRESSORS) 50 MG/ML IJ SOLN
INTRAMUSCULAR | Status: DC | PRN
  Administered 2022-01-24 (×6): 2.5 mg via INTRAVENOUS

## 2022-01-24 MED ORDER — CARVEDILOL 3.125 MG PO TABS
ORAL_TABLET | ORAL | Status: AC
  Filled 2022-01-24: qty 1

## 2022-01-24 MED ORDER — ROPIVACAINE HCL 5 MG/ML IJ SOLN
INTRAMUSCULAR | Status: DC | PRN
  Administered 2022-01-24: 10 mL via PERINEURAL

## 2022-01-24 MED ORDER — ENOXAPARIN SODIUM 40 MG/0.4ML IJ SOSY: 40.0000 mg | PREFILLED_SYRINGE | INTRAMUSCULAR | Status: DC

## 2022-01-24 MED ORDER — CEFAZOLIN SODIUM-DEXTROSE 2-4 GM/100ML-% IV SOLN
INTRAVENOUS | Status: AC
Start: 2022-01-24 — End: 2022-01-24
  Filled 2022-01-24: qty 100

## 2022-01-24 MED ORDER — CHLORHEXIDINE GLUCONATE 4 % EX LIQD: 60.0000 mL | Freq: Once | CUTANEOUS | Status: DC

## 2022-01-24 MED ORDER — FENTANYL CITRATE (PF) 100 MCG/2ML IJ SOLN: 100.0000 ug | Freq: Once | INTRAMUSCULAR | Status: AC

## 2022-01-24 MED ORDER — ACETAMINOPHEN 325 MG PO TABS
650.0000 mg | ORAL_TABLET | Freq: Four times a day (QID) | ORAL | Status: DC | PRN
  Administered 2022-01-25 – 2022-01-27 (×3): 650 mg via ORAL
  Filled 2022-01-24 (×3): qty 2

## 2022-01-24 MED ORDER — MEPIVACAINE HCL (PF) 2 % IJ SOLN
INTRAMUSCULAR | Status: DC | PRN
Start: 2022-01-24 — End: 2022-01-24
  Administered 2022-01-24: 10 mL

## 2022-01-24 MED ORDER — HYDROMORPHONE HCL 1 MG/ML IJ SOLN
0.5000 mg | INTRAMUSCULAR | Status: DC | PRN
  Administered 2022-01-26: 0.5 mg via INTRAVENOUS
  Filled 2022-01-24: qty 0.5

## 2022-01-24 MED ORDER — DOCUSATE SODIUM 100 MG PO CAPS
100.0000 mg | ORAL_CAPSULE | Freq: Two times a day (BID) | ORAL | Status: DC
  Administered 2022-01-24 – 2022-01-31 (×13): 100 mg via ORAL
  Filled 2022-01-24 (×14): qty 1

## 2022-01-24 MED ORDER — CHLORHEXIDINE GLUCONATE 0.12 % MT SOLN
OROMUCOSAL | Status: AC
  Administered 2022-01-24: 15 mL via OROMUCOSAL
  Filled 2022-01-24: qty 15

## 2022-01-24 MED ORDER — CEFAZOLIN SODIUM-DEXTROSE 2-4 GM/100ML-% IV SOLN
2.0000 g | INTRAVENOUS | Status: AC
  Administered 2022-01-24: 2 g via INTRAVENOUS

## 2022-01-24 MED ORDER — ORAL CARE MOUTH RINSE: 15.0000 mL | Freq: Once | OROMUCOSAL | Status: AC

## 2022-01-24 MED ORDER — LIDOCAINE 2% (20 MG/ML) 5 ML SYRINGE
INTRAMUSCULAR | Status: AC
  Filled 2022-01-24: qty 5

## 2022-01-24 MED ORDER — ATROPINE SULFATE 0.4 MG/ML IV SOLN
INTRAVENOUS | Status: AC
  Filled 2022-01-24: qty 1

## 2022-01-24 MED ORDER — FENTANYL CITRATE (PF) 100 MCG/2ML IJ SOLN
INTRAMUSCULAR | Status: AC
  Administered 2022-01-24: 25 ug via INTRAVENOUS
  Filled 2022-01-24: qty 2

## 2022-01-24 MED ORDER — LATANOPROST 0.005 % OP SOLN
1.0000 [drp] | Freq: Every day | OPHTHALMIC | Status: DC
  Administered 2022-01-24 – 2022-01-30 (×7): 1 [drp] via OPHTHALMIC
  Filled 2022-01-24: qty 2.5

## 2022-01-24 MED ORDER — LEVOTHYROXINE SODIUM 88 MCG PO TABS
88.0000 ug | ORAL_TABLET | Freq: Every day | ORAL | Status: DC
  Administered 2022-01-25 – 2022-01-31 (×7): 88 ug via ORAL
  Filled 2022-01-24 (×7): qty 1

## 2022-01-24 MED ORDER — PHENYLEPHRINE 80 MCG/ML (10ML) SYRINGE FOR IV PUSH (FOR BLOOD PRESSURE SUPPORT)
PREFILLED_SYRINGE | INTRAVENOUS | Status: DC | PRN
  Administered 2022-01-24: 80 ug via INTRAVENOUS
  Administered 2022-01-24: 160 ug via INTRAVENOUS
  Administered 2022-01-24 (×5): 80 ug via INTRAVENOUS

## 2022-01-24 MED ORDER — DORZOLAMIDE HCL-TIMOLOL MAL 2-0.5 % OP SOLN
1.0000 [drp] | Freq: Two times a day (BID) | OPHTHALMIC | Status: DC
  Administered 2022-01-24 – 2022-01-31 (×13): 1 [drp] via OPHTHALMIC
  Filled 2022-01-24: qty 10

## 2022-01-24 MED ORDER — ENOXAPARIN SODIUM 30 MG/0.3ML IJ SOSY: 30.0000 mg | PREFILLED_SYRINGE | INTRAMUSCULAR | Status: DC

## 2022-01-24 MED ORDER — HYDRALAZINE HCL 20 MG/ML IJ SOLN
10.0000 mg | Freq: Four times a day (QID) | INTRAMUSCULAR | Status: DC | PRN
  Administered 2022-01-25: 10 mg via INTRAVENOUS
  Filled 2022-01-24: qty 1

## 2022-01-24 MED ORDER — PROPOFOL 500 MG/50ML IV EMUL
INTRAVENOUS | Status: DC | PRN
  Administered 2022-01-24: 50 ug/kg/min via INTRAVENOUS

## 2022-01-24 MED ORDER — CHLORHEXIDINE GLUCONATE 0.12 % MT SOLN: 15.0000 mL | Freq: Once | OROMUCOSAL | Status: AC

## 2022-01-24 MED ORDER — LACTATED RINGERS IV SOLN: INTRAVENOUS | Status: DC

## 2022-01-24 SURGICAL SUPPLY — 88 items
BAG COUNTER SPONGE SURGICOUNT (BAG) ×2 IMPLANT
BAG SPNG CNTER NS LX DISP (BAG) ×1
BIT DRILL 2.2 SS TIBIAL (BIT) ×1 IMPLANT
BLADE CLIPPER SURG (BLADE) IMPLANT
BNDG CMPR 9X4 STRL LF SNTH (GAUZE/BANDAGES/DRESSINGS) ×1
BNDG COHESIVE 1X5 TAN STRL LF (GAUZE/BANDAGES/DRESSINGS) IMPLANT
BNDG CONFORM 2 STRL LF (GAUZE/BANDAGES/DRESSINGS) IMPLANT
BNDG ELASTIC 3X5.8 VLCR STR LF (GAUZE/BANDAGES/DRESSINGS) ×1 IMPLANT
BNDG ELASTIC 4X5.8 VLCR STR LF (GAUZE/BANDAGES/DRESSINGS) ×2 IMPLANT
BNDG ESMARK 4X9 LF (GAUZE/BANDAGES/DRESSINGS) ×2 IMPLANT
BNDG GAUZE ELAST 4 BULKY (GAUZE/BANDAGES/DRESSINGS) ×1 IMPLANT
CANISTER SUCT 3000ML PPV (MISCELLANEOUS) ×2 IMPLANT
CORD BIPOLAR FORCEPS 12FT (ELECTRODE) ×2 IMPLANT
COVER SURGICAL LIGHT HANDLE (MISCELLANEOUS) ×2 IMPLANT
CUFF TOURN SGL QUICK 18X4 (TOURNIQUET CUFF) ×2 IMPLANT
DRAIN PENROSE 1/4X12 LTX STRL (WOUND CARE) IMPLANT
DRAPE OEC MINIVIEW 54X84 (DRAPES) ×3 IMPLANT
DRAPE SURG 17X23 STRL (DRAPES) ×2 IMPLANT
DRSG ADAPTIC 3X8 NADH LF (GAUZE/BANDAGES/DRESSINGS) ×2 IMPLANT
DRSG EMULSION OIL 3X3 NADH (GAUZE/BANDAGES/DRESSINGS) ×1 IMPLANT
ELECT REM PT RETURN 9FT ADLT (ELECTROSURGICAL)
ELECTRODE REM PT RTRN 9FT ADLT (ELECTROSURGICAL) IMPLANT
GAUZE SPONGE 4X4 12PLY STRL (GAUZE/BANDAGES/DRESSINGS) ×2 IMPLANT
GAUZE XEROFORM 1X8 LF (GAUZE/BANDAGES/DRESSINGS) ×1 IMPLANT
GAUZE XEROFORM 5X9 LF (GAUZE/BANDAGES/DRESSINGS) IMPLANT
GLOVE BIOGEL PI IND STRL 8.5 (GLOVE) ×1 IMPLANT
GLOVE BIOGEL PI INDICATOR 8.5 (GLOVE)
GLOVE SS BIOGEL STRL SZ 8 (GLOVE) ×1 IMPLANT
GLOVE SUPERSENSE BIOGEL SZ 8 (GLOVE) ×1
GLOVE SURG ORTHO 8.0 STRL STRW (GLOVE) ×1 IMPLANT
GLOVE SURG UNDER POLY LF SZ7.5 (GLOVE) ×5 IMPLANT
GOWN STRL REUS W/ TWL LRG LVL3 (GOWN DISPOSABLE) ×3 IMPLANT
GOWN STRL REUS W/ TWL XL LVL3 (GOWN DISPOSABLE) ×1 IMPLANT
GOWN STRL REUS W/TWL LRG LVL3 (GOWN DISPOSABLE) ×2
GOWN STRL REUS W/TWL XL LVL3 (GOWN DISPOSABLE) ×2
HANDPIECE INTERPULSE COAX TIP (DISPOSABLE)
HIBICLENS CHG 4% 4OZ BTL (MISCELLANEOUS) ×2 IMPLANT
K-WIRE 1.6 (WIRE) ×4
K-WIRE FX5X1.6XNS BN SS (WIRE) ×2
KIT BASIN OR (CUSTOM PROCEDURE TRAY) ×2 IMPLANT
KIT TURNOVER KIT B (KITS) ×2 IMPLANT
KWIRE FX5X1.6XNS BN SS (WIRE) IMPLANT
MANIFOLD NEPTUNE II (INSTRUMENTS) ×1 IMPLANT
NDL HYPO 25GX1X1/2 BEV (NEEDLE) ×1 IMPLANT
NEEDLE HYPO 25GX1X1/2 BEV (NEEDLE) ×2 IMPLANT
NS IRRIG 1000ML POUR BTL (IV SOLUTION) ×3 IMPLANT
PACK ORTHO EXTREMITY (CUSTOM PROCEDURE TRAY) ×2 IMPLANT
PAD ARMBOARD 7.5X6 YLW CONV (MISCELLANEOUS) ×4 IMPLANT
PAD CAST 3X4 CTTN HI CHSV (CAST SUPPLIES) ×1 IMPLANT
PAD CAST 4YDX4 CTTN HI CHSV (CAST SUPPLIES) ×1 IMPLANT
PADDING CAST COTTON 3X4 STRL (CAST SUPPLIES) ×2
PADDING CAST COTTON 4X4 STRL (CAST SUPPLIES) ×2
PEG LOCKING SMOOTH 2.2X16 (Screw) ×4 IMPLANT
PEG LOCKING SMOOTH 2.2X18 (Peg) ×2 IMPLANT
PEG LOCKING SMOOTH 2.2X20 (Screw) ×1 IMPLANT
PLATE DVR ULNA (Plate) ×1 IMPLANT
PLATE NARROW DVR LEFT (Plate) ×1 IMPLANT
SCREW  LP NL 2.7X10MM (Screw) ×2 IMPLANT
SCREW 2.7X12MM (Screw) ×4 IMPLANT
SCREW LOCK 12X2.7X 3 LD (Screw) IMPLANT
SCREW LOCK 14X2.7X 3 LD TPR (Screw) IMPLANT
SCREW LOCK 16X2.7X 3 LD TPR (Screw) IMPLANT
SCREW LOCKING 2.7X12MM (Screw) ×8 IMPLANT
SCREW LOCKING 2.7X14 (Screw) ×2 IMPLANT
SCREW LOCKING 2.7X16 (Screw) ×2 IMPLANT
SCREW LOCKING 2.7X9MM (Screw) ×1 IMPLANT
SCREW LP NL 2.7X10MM (Screw) IMPLANT
SCREW LP NL 2.7X22MM (Screw) ×2 IMPLANT
SCREW MULTI DIRECTIONAL 2.7X14 (Screw) ×1 IMPLANT
SCREW NONLOCK 2.7X20MM (Screw) ×1 IMPLANT
SET CYSTO W/LG BORE CLAMP LF (SET/KITS/TRAYS/PACK) IMPLANT
SET HNDPC FAN SPRY TIP SCT (DISPOSABLE) IMPLANT
SLING ARM IMMOBILIZER MED (SOFTGOODS) ×1 IMPLANT
SOAP 2 % CHG 4 OZ (WOUND CARE) ×1 IMPLANT
SPONGE T-LAP 18X18 ~~LOC~~+RFID (SPONGE) ×2 IMPLANT
SPONGE T-LAP 4X18 ~~LOC~~+RFID (SPONGE) ×3 IMPLANT
SUT ETHILON 4 0 PS 2 18 (SUTURE) ×4 IMPLANT
SUT ETHILON 5 0 P 3 18 (SUTURE)
SUT NYLON ETHILON 5-0 P-3 1X18 (SUTURE) IMPLANT
SWAB COLLECTION DEVICE MRSA (MISCELLANEOUS) ×1 IMPLANT
SWAB CULTURE ESWAB REG 1ML (MISCELLANEOUS) IMPLANT
SYR CONTROL 10ML LL (SYRINGE) IMPLANT
TOWEL GREEN STERILE (TOWEL DISPOSABLE) ×2 IMPLANT
TOWEL GREEN STERILE FF (TOWEL DISPOSABLE) ×2 IMPLANT
TUBE CONNECTING 12X1/4 (SUCTIONS) ×2 IMPLANT
UNDERPAD 30X36 HEAVY ABSORB (UNDERPADS AND DIAPERS) ×2 IMPLANT
WATER STERILE IRR 1000ML POUR (IV SOLUTION) ×1 IMPLANT
YANKAUER SUCT BULB TIP NO VENT (SUCTIONS) ×1 IMPLANT

## 2022-01-24 NOTE — Progress Notes (Signed)
PHARMACIST - PHYSICIAN COMMUNICATION ? ?CONCERNING:  Enoxaparin (Lovenox) for DVT Prophylaxis  ? ? ?RECOMMENDATION: ?Patient was prescribed enoxaprin '40mg'$  q24 hours for VTE prophylaxis.  ? Danley Danker Weights  ? 01/23/22 1854  ?Weight: 45.4 kg (100 lb 1.4 oz)  ? ? ?Body mass index is 19.55 kg/m?. ? ?Estimated Creatinine Clearance: 31.4 mL/min (by C-G formula based on SCr of 0.82 mg/dL). ? ?Patient is candidate for enoxaparin '30mg'$  every 24 hours based on CrCl <62m/min or Weight <45kg ? ?DESCRIPTION: ?Pharmacy has [facility]arin dose [facility] ? ?Patient is now receiving enoxaparin 30 mg every 24 hours  ? ? ?SBerta Minor PharmD ?Clinical Pharmacist  ?01/24/2022 ?12:43 PM ? ?

## 2022-01-24 NOTE — ED Notes (Signed)
Attempt to call report to floor RN  Unavailable at this time.  Will call later ?

## 2022-01-24 NOTE — Transfer of Care (Signed)
Immediate Anesthesia Transfer of Care Note ? ?Patient: BRANDIS WIXTED ? ?Procedure(s) Performed: IRRIGATION AND DEBRIDEMENT EXTREMITY (Left: Wrist) ?CLOSED REDUCTION WRIST (Left: Wrist) ?OPEN REDUCTION INTERNAL FIXATION (ORIF) WRIST FRACTURE (Left: Wrist) ? ?Patient Location: PACU ? ?Anesthesia Type:MAC combined with regional for post-op pain ? ?Level of Consciousness: drowsy ? ?Airway & Oxygen Therapy: Patient Spontanous Breathing and Patient connected to face mask oxygen ? ?Post-op Assessment: Report given to RN, Post -op Vital signs reviewed and stable and Patient moving all extremities ? ?Post vital signs: Reviewed and stable ? ?Last Vitals:  ?Vitals Value Taken Time  ?BP 123/61 01/24/22 2055  ?Temp 36.2 ?C 01/24/22 2053  ?Pulse 76 01/24/22 2059  ?Resp 18 01/24/22 2059  ?SpO2 95 % 01/24/22 2059  ?Vitals shown include unvalidated device data. ? ?Last Pain:  ?Vitals:  ? 01/24/22 1745  ?TempSrc:   ?PainSc: 0-No pain  ?   ? ?Patients Stated Pain Goal: 4 (01/24/22 1745) ? ?Complications: No notable events documented. ?

## 2022-01-24 NOTE — Anesthesia Procedure Notes (Signed)
Anesthesia Regional Block: Supraclavicular block  ? ?Pre-Anesthetic Checklist: , timeout performed,  Correct Patient, Correct Site, Correct Laterality,  Correct Procedure, Correct Position, site marked,  Risks and benefits discussed,  Surgical consent,  Pre-op evaluation,  At surgeon's request and post-op pain management ? ?Laterality: Left ? ?Prep: Maximum Sterile Barrier Precautions used, chloraprep     ?  ?Needles:  ?Injection technique: Single-shot ? ?Needle Type: Echogenic Stimulator Needle   ? ? ?Needle Length: 5cm  ?Needle Gauge: 22  ? ? ? ?Additional Needles: ? ? ?Procedures:,,,, ultrasound used (permanent image in chart),,    ?Narrative:  ?Start time: 01/24/2022 5:38 PM ?End time: 01/24/2022 5:43 PM ?Injection made incrementally with aspirations every 5 mL. ? ?Performed by: Personally  ?Anesthesiologist: Freddrick March, MD ? ?Additional Notes: ?Monitors applied. No increased pain on injection. No increased resistance to injection. Injection made in 5cc increments. Good needle visualization. Patient tolerated procedure well.  ? ? ? ? ?

## 2022-01-24 NOTE — ED Notes (Signed)
[facility] at bedside 

## 2022-01-24 NOTE — Op Note (Signed)
OPERATIVE NOTE ? ?DATE OF PROCEDURE: 01/24/2022 ? ?SURGEONS: Primary: Orene Desanctis, MD ? ?PREOPERATIVE DIAGNOSIS: left wrist open fracture ? ?POSTOPERATIVE DIAGNOSIS: Same ? ?NAME OF PROCEDURE:  ? ? LEFT distal radius open reduction internal fixation, INTRA- articular, 3 fragments ? LEFT distal radius open fracture I&D ? LEFT distal ulna open reduction internal fixation ?4.    LEFT wrist brachioradialis tenotomy  ?5.    LEFT wrist radiographs four views with intraoperative interpretation ? ? ?ANESTHESIA: Regional Block + MAC ? ?SKIN PREPARATION: Hibiclens ? ?ESTIMATED BLOOD LOSS: Minimal ? ?IMPLANTS: [employer] DVR Crosslock volar plate and screws ? ?Implant Name Type Inv. Item Serial No. Manufacturer Lot No. LRB No. Used Action  ?PLATE NARROW DVR LEFT - [id number] Plate PLATE NARROW DVR LEFT  [employer](ORTH,TRAU,BIO,SG)  Left 1 Implanted  ?PEG LOCKING SMOOTH 2.2X18 - [id number] Peg PEG LOCKING SMOOTH 2.2X18  [employer](ORTH,TRAU,BIO,SG)  Left 2 Implanted  ?PEG LOCKING SMOOTH 2.2X16 - [id number] Screw PEG LOCKING SMOOTH 2.2X16  [employer](ORTH,TRAU,BIO,SG)  Left 1 Implanted  ?SCREW LOCKING 2.7X12MM - [id number] Screw SCREW LOCKING 2.7X12MM  [employer](ORTH,TRAU,BIO,SG)  Left 1 Implanted  ?SCREW LOCKING 2.7X12MM - [id number] Screw SCREW LOCKING 2.7X12MM  [employer](ORTH,TRAU,BIO,SG)  Left 1 Implanted  ?SCREW LOCKING 2.7X12MM - [id number] Screw SCREW LOCKING 2.7X12MM  [employer](ORTH,TRAU,BIO,SG)  Left 1 Implanted  ?SCREW LOCKING 2.7X12MM - [id number] Screw SCREW LOCKING 2.7X12MM  [employer](ORTH,TRAU,BIO,SG)  Left 1 Implanted  ?SCREW NONLOCK 2.7X20MM - [id number] Screw SCREW NONLOCK 2.7X20MM  [employer](ORTH,TRAU,BIO,SG)  Left 1 Implanted  ?SCREW NONLOCK 2.7X20MM - [id number] Screw SCREW NONLOCK 2.7X20MM  [employer](ORTH,TRAU,BIO,SG)  Left 1 Implanted  ?SCREW LOCKING 2.7X14 - [id number] Screw SCREW LOCKING 2.7X14  [employer](ORTH,TRAU,BIO,SG)  Left 1 Implanted  ?SCREW LOCKING 2.7X16 - [id number] Screw SCREW  LOCKING 2.7X16  [employer](ORTH,TRAU,BIO,SG)  Left 1 Implanted  ?SCREW MULTI DIRECTIONAL 2.7X14 - [id number] Screw SCREW MULTI DIRECTIONAL 2.7X14  [employer](ORTH,TRAU,BIO,SG)  Left 1 Implanted  ?PLATE DVR ULNA - [id number] Plate PLATE DVR ULNA  [employer](ORTH,TRAU,BIO,SG)  Left 1 Implanted  ? ? ?INDICATIONS:  Mackenzie Madden is a 88 y.o. female who has the above preoperative diagnosis. The patient has decided to proceed with surgical intervention.  Risks, benefits and alternatives of operative management were discussed including, but not limited to, risks of anesthesia complications, infection, pain, persistent symptoms, stiffness, need for future surgery.  The patient understands, agrees and elects to proceed with surgery.   ? ?DESCRIPTION OF PROCEDURE: The patient was met in the pre-operative area and their identity was verified.  The operative location and laterality was also verified and marked.  The patient was brought to the OR and was placed supine on the table.  After repeat patient identification with the operative team anesthesia was provided and the patient was prepped and draped in the usual sterile fashion.  A final timeout was performed verifying the correction patient, procedure, location and laterality. ? ?Preoperative antibiotics were administered. The LEFT upper extremity was exsanguinated with an Esmarch and tourniquet inflated to 21mHg. Under loupe magnification, an incision was made directly over the flexor carpi radialis (FCR) tendon. Bipolar was utilized for hemostasis. The roof of the FCR tendon sheath was incised. The FCR tendon was then retracted ulnarly to protect the palmar cutaneous branch of the median nerve. Subsequently, the floor of the FCR tendon sheath was incised over the distal end of the radius.  The flexor pollicis longus (FPL) was swept ulnarly to reveal the pronator quadratus.  The pronator quadratus fascia was incised from  its distal and radial borders. A periosteal elevator  was utilized to mobilize the pronator quadratus muscle off the distal radius.  The fracture site was irrigated and prepared for reduction with a freer and adson forceps. There were 3 distal radius fracture fragments. The brachioradialis tendon insertion was released to facilitate reduction.  This release was performed by identifying the broad insertion of the brachiradialis tendon and also identifying the 1st dorsal compartment tendons.  The 1st dorsal compartment tendons were protected, the broad tendon insertion was released under direct visualization. At this time the distal radius was delivered out of the wound and was debrided with a currette and 15 blade scalpel. There was no gross contamination and then the bone and wound was thoroughly irrigated with normal saline. The fracture was reduced and provisionally fixed with K-wires. We then selected a proper length and width volar plate.  The plate was placed on the distal end of the radius with the fracture reduced and secured to the bone. Using mini C-arm the fracture reduction and position of the plate were deemed to be satisfactory.  We proceeded with securing the plate to the radius with the 1 bicortical nonlocking screw in the oblong portion of the shaft.  With the intermediate column reduced, we secured the distal end of the plate with 2 screws in the distal ulnar portion of plate.  We again used the C-arm to verify satisfactory plate position as well as fracture reduction. The radial column was then reduced and radial styloid locking screws were placed. The remainder shaft screws were placed through the plate. At this time an ulnar incision was made over the distal ulnar shaft and skin and subcutaneous tissues were divided. The distal ulnar shaft was identified the fracture ends were cleaned and were reduced with a bone clamp.  At this time a distal ulna plate precontoured was selected and fixed to the bone distally with a nonlocking screw.  The fracture  was then again reduced and under compression the proximal aspect of the plate was secured to the ulnar shaft.  The remaining screws were placed both proximally and distally with excellent reduction of the fracture site and hardware placement. I used the mini C-arm to verify satisfactory plate position, screw lengths and fracture reduction of the distal radius and ulna. The DRUJ was then tested in neutral, pronation and supination and was found to be stable.The tourniquet was deflated. Meticulous hemostasis was obtained. The incision was copiously irrigated with normal saline and closed with interrupted 4-0 nylon horizontal mattress sutures. The incision was covered with adaptic, sterile guaze, webril and well padded short arm splint. The fingers were pink, warm and well perfused. All counts were correct. The patient was awoken from anesthesia and brought to PACU for recovery in stable condition. ? ? ?Mackenzie Holmes, MD ?Orthopaedic Hand Surgery ? ?

## 2022-01-24 NOTE — H&P (Signed)
?History and Physical  ? ? ?Patient: Mackenzie Madden HMC:[id number] DOB: December 14, 1927 ?DOA: 01/23/2022 ?DOS: the patient was seen and examined on 01/24/2022 ?PCP: Lavone Orn, MD  ?Patient coming from: Home ? ?Chief Complaint:  ?Chief Complaint  ?Patient presents with  ? Fall  ? ?HPI: Mackenzie Madden is a 88 y.o. female with medical history significant of  hypertension, hypothyroidism was transferred from [facility] ED after experiencing a ground-level fall and was noted to have left wrist fracture.  Patient had been having vertigo or the last 2 weeks.  At baseline patient is able to ambulate with the help of walker.  Was recently treated with antibiotics for UTI. In the ED, blood pressure was elevated.  Initial labs showed hyponatremia with sodium of 132.  Urinalysis showing leukocytes.  Patient had swelling deformity and laceration with bony tenderness of the left wrist.  On-call hand surgeon Dr. Greta Doom was notified for left wrist fracture and plan for surgical intervention.  Patient denies any fever, chills or rigor.  Denies any shortness of breath chest pain or palpitation.  Denies any nausea vomiting or diarrhea.  Denies any urinary urgency, frequency or dysuria.   ? ?Review of Systems: As mentioned in the history of present illness. All other systems reviewed and are negative. ?Past Medical History:  ?Diagnosis Date  ? Glaucoma   ? Hypertension   ? Thyroid disease   ? ?Past Surgical History:  ?Procedure Laterality Date  ? ABDOMINAL HYSTERECTOMY  2010  ? CHOLECYSTECTOMY  2000  ? HIP PINNING,CANNULATED Right 08/05/2019  ? Procedure: CANNULATED HIP PINNING;  Surgeon: Mcarthur Rossetti, MD;  Location: [facility];  Service: Orthopedics;  Laterality: Right;  ? SPLENECTOMY, TOTAL    ? ?Social History:  reports that she has never smoked. She has never used smokeless tobacco. She reports that she does not drink alcohol and does not use drugs. ? ?No Known Allergies ? ?Family History  ?Problem Relation Age of  Onset  ? CAD Mother   ? Kidney cancer Mother   ? Heart attack Father   ? Colon cancer Neg Hx   ? Esophageal cancer Neg Hx   ? Stomach cancer Neg Hx   ? Rectal cancer Neg Hx   ? ? ?Prior to Admission medications   ?Medication Sig Start Date End Date Taking? Authorizing Provider  ?acetaminophen (TYLENOL) 325 MG tablet Take 1-2 tablets (325-650 mg total) by mouth every 6 (six) hours as needed for mild pain (pain score 1-3 or temp > 100.5). 08/06/19   Georgette Shell, MD  ?aspirin EC 325 MG EC tablet Take 1 tablet (325 mg total) by mouth daily with breakfast. 08/07/19   Georgette Shell, MD  ?brimonidine-timolol (COMBIGAN) 0.2-0.5 % ophthalmic solution Place 1 drop into the left eye every 12 (twelve) hours.     [provider]  ?Calcium 250 MG CAPS Take 250 mg by mouth daily.    [provider]  ?carvedilol (COREG) 3.125 MG tablet Take 1 tablet (3.125 mg total) by mouth 2 (two) times daily with a meal. 08/06/19   Georgette Shell, MD  ?cephALEXin (KEFLEX) 500 MG capsule Take 1 capsule (500 mg total) by mouth 2 (two) times daily. 01/02/22   Malvin Johns, MD  ?docusate sodium (COLACE) 100 MG capsule Take 1 capsule (100 mg total) by mouth 2 (two) times daily. 08/06/19   Georgette Shell, MD  ?HYDROcodone-acetaminophen [facility]) 7.5-325 MG tablet Take 1-2 tablets by mouth every 4 (four) hours as needed for  severe pain (pain score 7-10). 08/06/19   Georgette Shell, MD  ?latanoprost (XALATAN) 0.005 % ophthalmic solution Place 1 drop into both eyes at bedtime. 01/11/15   [provider]  ?levothyroxine (SYNTHROID) 88 MCG tablet Take 88 mcg by mouth daily before breakfast.     [provider]  ?Multiple Vitamin (MULTIVITAMIN WITH MINERALS) TABS tablet Take 1 tablet by mouth daily.    [provider]  ?naproxen sodium (ANAPROX) 220 MG tablet Take 220 mg by mouth daily as needed (pain). ALEVE    [provider]  ?omeprazole (PRILOSEC) 20 MG capsule Take 1 capsule  (20 mg total) by mouth daily before breakfast. 30 minutes before 11/15/18   Gatha Mayer, MD  ?ondansetron (ZOFRAN) 4 MG tablet Take 1 tablet (4 mg total) by mouth every 6 (six) hours as needed for nausea. 08/06/19   Georgette Shell, MD  ?polyethylene glycol (MIRALAX / GLYCOLAX) 17 g packet Take 17 g by mouth daily as needed for mild constipation. 08/06/19   Georgette Shell, MD  ?senna (SENOKOT) 8.6 MG TABS tablet Take 1 tablet (8.6 mg total) by mouth 2 (two) times daily. 08/06/19   Georgette Shell, MD  ?valsartan (DIOVAN) 40 MG tablet Take 40 mg by mouth daily as needed. If bp over 150 to take '40mg'$ . 07/31/19   [provider]  ? ? ?Physical Exam: ?Vitals:  ? 01/24/22 0900 01/24/22 0930 01/24/22 1009 01/24/22 1141  ?BP: 130/74 139/78 (!) 143/72 (!) 157/78  ?Pulse: 84 84 78 76  ?Resp: '15 18 16 19  '$ ?Temp:   98.4 ?F (36.9 ?C) (!) 97.5 ?F (36.4 ?C)  ?TempSrc:   Oral Oral  ?SpO2: 97% 96% 100%   ?Weight:      ?Height:      ?Body mass index is 19.55 kg/m?.  ?General:  Average built, not in obvious distress, elderly female, alert awake, ?HENT:   No scleral pallor or icterus noted. Oral mucosa is moist.  ?Chest:  Clear breath sounds.  Diminished breath sounds bilaterally. No crackles or wheezes.  ?CVS: S1 &S2 heard. No murmur.  Regular rate and rhythm. ?Abdomen: Soft, nontender, nondistended.  Bowel sounds are heard.   ?Extremities: Left wrist with the splint and dressing. ?Psych: Alert, awake and communicative, normal mood ?CNS:  No cranial nerve deficits.  Power equal in all extremities.   ?Skin: Warm and dry.  No rashes noted. ? ? ?Data Reviewed: ? ?  Latest Ref Rng & Units 01/23/2022  ?  7:06 PM 01/02/2022  ? 10:49 PM 08/07/2019  ?  2:53 AM  ?CBC  ?WBC 4.0 - 10.5 K/uL 9.0   9.9   8.3    ?Hemoglobin 12.0 - 15.0 g/dL 13.2   13.0   10.1    ?Hematocrit 36.0 - 46.0 % 40.2   39.1   31.3    ?Platelets 150 - 400 K/uL 145   159   145    ?  ? ?  Latest Ref Rng & Units 01/23/2022  ?  7:06 PM 01/02/2022  ? 10:49 PM  08/07/2019  ?  2:53 AM  ?BMP  ?Glucose 70 - 99 mg/dL 130   105   124    ?BUN 8 - 23 mg/dL '12   15   11    '$ ?Creatinine 0.44 - 1.00 mg/dL 0.82   0.77   0.66    ?Sodium 135 - 145 mmol/L 132   133   133    ?Potassium 3.5 -  5.1 mmol/L 4.8   3.7   3.5    ?Chloride 98 - 111 mmol/L 99   100   102    ?CO2 22 - 32 mmol/L '26   27   24    '$ ?Calcium 8.9 - 10.3 mg/dL 9.8   8.9   7.9    ?  ?Assessment and plan ? ?Fall with open fracture of the left wrist. ?Hand surgery was consulted.  Plan for surgical intervention.  Was on aspirin as outpatient we will continue to hold.  Communicated with the hand surgery team regarding the patient.  We will keep the patient n.p.o. for now.  Focus on analgesia. ? ?Vertigo ?Contributing to fall.  For the last 2 weeks.  Will provide supportive care.  Fall precautions.  We will get PT evaluation prior to discharge. ? ?History of hypertension ?Patient is on Coreg at home.  Will resume. ? ?Hypothyroidism.  Continue with Synthroid. ? ? Advance Care Planning:   Code Status: Full Code  ? ?Consults: Hand surgery ? ?Family Communication: Spoke with the patient's husband, daughters  at bedside. ? ?Severity of Illness: ?The appropriate patient status for this patient is INPATIENT. Inpatient status is judged to be reasonable and necessary in order to provide the required intensity of service to ensure the patient's safety. The patient's presenting symptoms, physical exam findings, and initial radiographic and laboratory data in the context of their chronic comorbidities is felt to place them at high risk for further clinical deterioration. Furthermore, it is not anticipated that the patient will be medically stable for discharge from the hospital within 2 midnights of admission.  ? ?Author: ?Flora Lipps, MD ?01/24/2022 12:26 PM ? ?For on call review [email or web address].  ?

## 2022-01-24 NOTE — TOC Initial Note (Signed)
Transition of Care (TOC) - Initial/Assessment Note  ? ? ?Patient Details  ?Name: TESHA ARCHAMBEAU ?MRN: [id number] ?Date of Birth: 03-30-1928 ? ?Transition of Care [facility]) CM/SW Contact:    ?Ninfa Meeker, RN ?Phone Number: ?01/24/2022, 1:32 PM ? ?Clinical Narrative: Patient is a 88 yr old female admitted with left wrist open fracture, s/p fall at home.  ?Transition of Care Screening Note: ? ? Transition of Care Department [facility]) has reviewed patient and no TOC needs have been identified at this time. We will continue to monitor patient advancement through Interdisciplinary progressions. If new patient transition needs arise, please place a consult.            ? ? ?  ?  ? ? ?Patient Goals and CMS Choice ?  ?  ?  ? ?Expected Discharge Plan and Services ?  ?  ?  ?  ?  ?                ?  ?  ?  ?  ?  ?  ?  ?  ?  ?  ? ?Prior Living Arrangements/Services ?  ?  ?  ?       ?  ?  ?  ?  ? ?Activities of Daily Living ?  ?  ? ?Permission Sought/Granted ?  ?  ?   ?   ?   ?   ? ?Emotional Assessment ?  ?  ?  ?  ?  ?  ? ?Admission diagnosis:  Dizziness [R42] ?Vertigo [R42] ?Open fracture of left wrist, initial encounter [S62.102B] ?Patient Active Problem List  ? Diagnosis Date Noted  ? Vertigo 01/23/2022  ? Hip fracture ([place]) 08/04/2019  ? Closed right femoral fracture ([place]) 08/04/2019  ? Closed subcapital fracture of right femur ([place])   ? Pubic ramus fracture, left, closed, initial encounter ([place])   ? Leukocytosis   ? Pubic ramus fracture ([place]) 08/11/2018  ? Sacral fracture, closed ([place])   ? Fall   ? Acute lower UTI   ? Laceration of bladder   ? Abnormal transaminases 06/06/2016  ? Atypical chest pain 06/04/2016  ? Chest pain 06/04/2016  ? Faintness   ? Chronic diastolic heart failure ([place])   ? Intraocular pressure increase   ? Syncope 02/21/2015  ? HTN (hypertension) 02/21/2015  ? Hypothyroid 02/21/2015  ? ?PCP:  Lavone Orn, MD ?Pharmacy:   ?[id number]?Phone: [phone]: [phone]? ? ? ? ?Social Determinants of Health (SDOH) Interventions ?  ? ?Readmission Risk Interventions ?   ? View : No data to display.  ?  ?  ?  ? ? ? ?

## 2022-01-24 NOTE — Progress Notes (Signed)
Returned to rm per bed from PACU. Pt is stable condition. Alert, oriented, and denies pain. Family at bedside. Family informed at 2110 pt would be coming up to the rm shortly and she did fine in surgery. ?

## 2022-01-24 NOTE — Hospital Course (Signed)
88 year old female with medical history notable for hypertension, hypothyroidism was transferred from [facility] ED after experiencing a ground-level fall and was noted to have left wrist fracture.  Patient had been having vertigo or the last 2 weeks.  At baseline patient is able to ambulate with the help of walker.  Was recently treated with antibiotics for UTI. In the ED, patient had swelling deformity and laceration with bony tenderness of the left wrist.  On-call hand surgeon Dr. Greta Doom was notified for left wrist fracture and plan for surgical intervention.  ? ?Assessment and plan ? ?Fall with open fracture of the left wrist. ?Hand surgery was consulted.  Plan for surgical intervention.  Was on aspirin as outpatient we will continue to hold. ? ?Vertigo ?Contributing to fall.  For the last 2 weeks.  Will provide supportive care.  Fall precautions. ? ?History of hypertension ?Patient is on Coreg and valsartan at home. ? ?Hypothyroidism.  Continue with Synthroid. ?

## 2022-01-24 NOTE — Consult Note (Signed)
?Orthopedic Hand Surgery Consultation: ? ?Reason for Consult: Left wrist open fracture ?Referring Physician: Dr. Louanne Belton ? ? ?HPI: Mackenzie Madden is a(an) 88 y.o. female who presents with a left open wrist fracture.  She was transferred from a freestanding emergency room last night and arrived this afternoon.  She did have an MRI performed for work-up for possible acute TIA which was reportedly negative.  The patient has pain to her left wrist that is worse with activity and improved with rest.  She has an open wound over the volar radial aspect.  She does live with her husband and they live independently with some help from their children.  She uses a walker to ambulate full-time. ? ? ?Physical Exam: ?Left upper Extremity ?Short arm splint remains in place.  She is able to weakly flex and extend all digits and thumb.  Sensation tact light touch and brisk capillary refill.  Reportedly has open wounds over the radial and volar aspect from the report from the emergency room physician. ? ? ?Assessment/Plan: ?Left distal radius and ulna open fractures after a mechanical fall versus fall from vertigo.  She was admitted to medicine for a syncope work-up.  Patient has hypertension and aortic stenosis.  This was evaluated by the anesthesiologist who states that it is safe to proceed with surgical intervention.  I had a long discussion with the patient and her daughter regarding the risks of anesthesia and prolonged hospital stay after surgical intervention.  Discussed the possible risks of infection, bleeding, injury to nerves and blood vessels, malunion, nonunion need for further surgery anesthesia complications such as stroke or even death.  We have decided that due to the open fracture nature and significant displacement of the wrist fracture that we will plan to proceed with surgical intervention to obtain the best outcome.  The patient lives with her husband and desires to continue to live as actively as  possible if she does use a walker which will require good wrist function to help ambulate for actives of daily living. ? ?Plan for left distal radius and ulna open fracture I&D and open reduction internal fixation. ? ? ? ?J. Sable Feil, MD ?Orthopaedic Hand Surgeon ?[facility] ?Office number: [phone] ?[address] ?[place], [place] [address] ? ? ? ?Past Medical History:  ?Diagnosis Date  ? Glaucoma   ? Glaucoma   ? HOH (hard of hearing)   ? no hearing aids  ? Hypertension   ? Hypothyroidism   ? Thyroid disease   ? ? ?Past Surgical History:  ?Procedure Laterality Date  ? ABDOMINAL HYSTERECTOMY  2010  ? CHOLECYSTECTOMY  2000  ? HIP PINNING,CANNULATED Right 08/05/2019  ? Procedure: CANNULATED HIP PINNING;  Surgeon: Mcarthur Rossetti, MD;  Location: [facility];  Service: Orthopedics;  Laterality: Right;  ? SPLENECTOMY, TOTAL    ? ? ?Family History  ?Problem Relation Age of Onset  ? CAD Mother   ? Kidney cancer Mother   ? Heart attack Father   ? Colon cancer Neg Hx   ? Esophageal cancer Neg Hx   ? Stomach cancer Neg Hx   ? Rectal cancer Neg Hx   ? ? ?Social History:  reports that she has never smoked. She has never used smokeless tobacco. She reports that she does not drink alcohol and does not use drugs. ? ?Allergies: No Known Allergies ? ?Medications: reviewed, no changes to patient's home medications ? ?Results for orders placed or performed during the hospital encounter of 01/23/22 (from  the past 48 hour(s))  ?CBG monitoring, ED     Status: Abnormal  ? Collection Time: 01/23/22  7:04 PM  ?Result Value Ref Range  ? Glucose-Capillary 126 (H) 70 - 99 mg/dL  ?  Comment: Glucose reference range applies only to samples taken after fasting for at least 8 hours.  ?Basic metabolic panel     Status: Abnormal  ? Collection Time: 01/23/22  7:06 PM  ?Result Value Ref Range  ? Sodium 132 (L) 135 - 145 mmol/L  ? Potassium 4.8 3.5 - 5.1 mmol/L  ? Chloride 99 98 - 111 mmol/L  ? CO2 26 22 - 32 mmol/L  ? Glucose,  Bld 130 (H) 70 - 99 mg/dL  ?  Comment: Glucose reference range applies only to samples taken after fasting for at least 8 hours.  ? BUN 12 8 - 23 mg/dL  ? Creatinine, Ser 0.82 0.44 - 1.00 mg/dL  ? Calcium 9.8 8.9 - 10.3 mg/dL  ? GFR, Estimated >60 >60 mL/min  ?  Comment: (NOTE) ?Calculated using the CKD-EPI Creatinine Equation (2021) ?  ? Anion gap 7 5 - 15  ?  Comment: Performed at [facility], [address] [place], [place] [address]  ?CBC     Status: Abnormal  ? Collection Time: 01/23/22  7:06 PM  ?Result Value Ref Range  ? WBC 9.0 4.0 - 10.5 K/uL  ? RBC 4.19 3.87 - 5.11 MIL/uL  ? Hemoglobin 13.2 12.0 - 15.0 g/dL  ? HCT 40.2 36.0 - 46.0 %  ? MCV 95.9 80.0 - 100.0 fL  ? MCH 31.5 26.0 - 34.0 pg  ? MCHC 32.8 30.0 - 36.0 g/dL  ? RDW 13.9 11.5 - 15.5 %  ? Platelets 145 (L) 150 - 400 K/uL  ? nRBC 0.0 0.0 - 0.2 %  ?  Comment: Performed at [facility], [address], [place], [place] [address]  ?Urinalysis, Routine w reflex microscopic     Status: Abnormal  ? Collection Time: 01/23/22  9:11 PM  ?Result Value Ref Range  ? Color, Urine YELLOW YELLOW  ? APPearance CLEAR CLEAR  ? Specific Gravity, Urine 1.005 1.005 - 1.030  ? pH 7.5 5.0 - 8.0  ? Glucose, UA NEGATIVE NEGATIVE mg/dL  ? Hgb urine dipstick NEGATIVE NEGATIVE  ? Bilirubin Urine NEGATIVE NEGATIVE  ? Ketones, ur NEGATIVE NEGATIVE mg/dL  ? Protein, ur NEGATIVE NEGATIVE mg/dL  ? Nitrite NEGATIVE NEGATIVE  ? Leukocytes,Ua MODERATE (A) NEGATIVE  ? RBC / HPF 0-5 0 - 5 RBC/hpf  ? WBC, UA 0-5 0 - 5 WBC/hpf  ? Bacteria, UA RARE (A) NONE SEEN  ?  Comment: Performed at [facility], [address], [place], [place] [address]  ? ? ?DG Wrist 2 Views Left ? ?Result Date: 01/23/2022 ?CLINICAL DATA:  Fall while ambulating with walker at home. Left wrist swelling and deformity. EXAM: LEFT WRIST - 2 VIEW COMPARISON:  None. FINDINGS: Comminuted and displaced distal radius fracture primarily involving the metaphysis.  There is apex radial angulation. Large amount of air in the soft tissues with skin irregularity and possible exposed bone suggesting open fracture. Fracture extends into the distal radioulnar joint. There is an oblique displaced fracture of the distal ulna with distal fracture fragments displaced about the volar aspect. The carpal bones remain aligned with the distal radius fracture fragment. 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 Comminuted, displaced and angulated distal radius fracture with intra-articular extension. Large amount of air in  the soft tissues, skin irregularity with possible exposed bone suggesting open fracture. 2. Displaced angulated distal ulna fracture. Electronically Signed   By: Keith Rake M.D.   On: 01/23/2022 20:49  ? ?DG Wrist Complete Right ? ?Result Date: 01/23/2022 ?CLINICAL DATA:  Fall while ambulating with walker. Right wrist swelling. EXAM: RIGHT WRIST - COMPLETE 3+ VIEW COMPARISON:  None. FINDINGS: There is no evidence of fracture or dislocation. Cystic changes in the proximal lunate. There is chondrocalcinosis of the triangular fibrocartilage. Soft tissues are unremarkable. IMPRESSION: 1. No fracture or subluxation of the right wrist. 2. Chondrocalcinosis of the triangular fibrocartilage. Electronically Signed   By: Keith Rake M.D.   On: 01/23/2022 20:50  ? ?CT Head Wo Contrast ? ?Result Date: 01/23/2022 ?CLINICAL DATA:  Head trauma, moderate-severe.  Fall. EXAM: CT HEAD WITHOUT CONTRAST TECHNIQUE: Contiguous axial images were obtained from the base of the skull through the vertex without intravenous contrast. RADIATION DOSE REDUCTION: This exam was performed according to the departmental dose-optimization program which includes automated exposure control, adjustment of the mA and/or kV according to patient size and/or use of iterative reconstruction technique. COMPARISON:  10/27/2018 FINDINGS: Brain: There is atrophy and chronic small vessel  disease changes. No acute intracranial abnormality. Specifically, no hemorrhage, hydrocephalus, mass lesion, acute infarction, or significant intracranial injury. Vascular: No hyperdense vessel or unexpected calcifi

## 2022-01-24 NOTE — Consult Note (Signed)
Reason for Consult:Left wrist fx ?Referring Physician: Corrie Mckusick Pokhrel ?Time called: 9147 ?Time at bedside: 1539 ? ? ?Mackenzie Madden is an 88 y.o. female.  ?HPI: Mackenzie Madden was at home ambulating with her RW when she became acutely vertiginous and fell. She put out her hands to brake her fall and had immediate left wrist pain. She also lacerated her wrist. She was brought to [facility] where x-rays showed a left wrist fx and detailed examination was worrisome for an open component of the fx. Hand surgery was consulted and she was transferred to [facility] for definitive care. She lives at home with her husband and is RHD. ? ?Past Medical History:  ?Diagnosis Date  ? Glaucoma   ? Hypertension   ? Thyroid disease   ? ? ?Past Surgical History:  ?Procedure Laterality Date  ? ABDOMINAL HYSTERECTOMY  2010  ? CHOLECYSTECTOMY  2000  ? HIP PINNING,CANNULATED Right 08/05/2019  ? Procedure: CANNULATED HIP PINNING;  Surgeon: Mcarthur Rossetti, MD;  Location: [facility];  Service: Orthopedics;  Laterality: Right;  ? SPLENECTOMY, TOTAL    ? ? ?Family History  ?Problem Relation Age of Onset  ? CAD Mother   ? Kidney cancer Mother   ? Heart attack Father   ? Colon cancer Neg Hx   ? Esophageal cancer Neg Hx   ? Stomach cancer Neg Hx   ? Rectal cancer Neg Hx   ? ? ?Social History:  reports that she has never smoked. She has never used smokeless tobacco. She reports that she does not drink alcohol and does not use drugs. ? ?Allergies: No Known Allergies ? ?Medications: I have reviewed the patient's current medications. ? ?Results for orders placed or performed during the hospital encounter of 01/23/22 (from the past 48 hour(s))  ?CBG monitoring, ED     Status: Abnormal  ? Collection Time: 01/23/22  7:04 PM  ?Result Value Ref Range  ? Glucose-Capillary 126 (H) 70 - 99 mg/dL  ?  Comment: Glucose reference range applies only to samples taken after fasting for at least 8 hours.  ?Basic metabolic panel     Status: Abnormal  ? Collection Time: 01/23/22   7:06 PM  ?Result Value Ref Range  ? Sodium 132 (L) 135 - 145 mmol/L  ? Potassium 4.8 3.5 - 5.1 mmol/L  ? Chloride 99 98 - 111 mmol/L  ? CO2 26 22 - 32 mmol/L  ? Glucose, Bld 130 (H) 70 - 99 mg/dL  ?  Comment: Glucose reference range applies only to samples taken after fasting for at least 8 hours.  ? BUN 12 8 - 23 mg/dL  ? Creatinine, Ser 0.82 0.44 - 1.00 mg/dL  ? Calcium 9.8 8.9 - 10.3 mg/dL  ? GFR, Estimated >60 >60 mL/min  ?  Comment: (NOTE) ?Calculated using the CKD-EPI Creatinine Equation (2021) ?  ? Anion gap 7 5 - 15  ?  Comment: Performed at [facility], [address], [place], [place] [address]  ?CBC     Status: Abnormal  ? Collection Time: 01/23/22  7:06 PM  ?Result Value Ref Range  ? WBC 9.0 4.0 - 10.5 K/uL  ? RBC 4.19 3.87 - 5.11 MIL/uL  ? Hemoglobin 13.2 12.0 - 15.0 g/dL  ? HCT 40.2 36.0 - 46.0 %  ? MCV 95.9 80.0 - 100.0 fL  ? MCH 31.5 26.0 - 34.0 pg  ? MCHC 32.8 30.0 - 36.0 g/dL  ? RDW 13.9 11.5 - 15.5 %  ? Platelets 145 (L)  150 - 400 K/uL  ? nRBC 0.0 0.0 - 0.2 %  ?  Comment: Performed at [facility], [address] [place], [place] [address]  ?Urinalysis, Routine w reflex microscopic     Status: Abnormal  ? Collection Time: 01/23/22  9:11 PM  ?Result Value Ref Range  ? Color, Urine YELLOW YELLOW  ? APPearance CLEAR CLEAR  ? Specific Gravity, Urine 1.005 1.005 - 1.030  ? pH 7.5 5.0 - 8.0  ? Glucose, UA NEGATIVE NEGATIVE mg/dL  ? Hgb urine dipstick NEGATIVE NEGATIVE  ? Bilirubin Urine NEGATIVE NEGATIVE  ? Ketones, ur NEGATIVE NEGATIVE mg/dL  ? Protein, ur NEGATIVE NEGATIVE mg/dL  ? Nitrite NEGATIVE NEGATIVE  ? Leukocytes,Ua MODERATE (A) NEGATIVE  ? RBC / HPF 0-5 0 - 5 RBC/hpf  ? WBC, UA 0-5 0 - 5 WBC/hpf  ? Bacteria, UA RARE (A) NONE SEEN  ?  Comment: Performed at [facility], [address] [place], [place] [address]  ? ? ?DG Wrist 2 Views Left ? ?Result Date: 01/23/2022 ?CLINICAL DATA:  Fall while ambulating with walker at home. Left  wrist swelling and deformity. EXAM: LEFT WRIST - 2 VIEW COMPARISON:  None. FINDINGS: Comminuted and displaced distal radius fracture primarily involving the metaphysis. There is apex radial angulation. Large amount of air in the soft tissues with skin irregularity and possible exposed bone suggesting open fracture. Fracture extends into the distal radioulnar joint. There is an oblique displaced fracture of the distal ulna with distal fracture fragments displaced about the volar aspect. The carpal bones remain aligned with the distal radius fracture fragment. 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 Comminuted, displaced and angulated distal radius fracture with intra-articular extension. Large amount of air in the soft tissues, skin irregularity with possible exposed bone suggesting open fracture. 2. Displaced angulated distal ulna fracture. Electronically Signed   By: Keith Rake M.D.   On: 01/23/2022 20:49  ? ?DG Wrist Complete Right ? ?Result Date: 01/23/2022 ?CLINICAL DATA:  Fall while ambulating with walker. Right wrist swelling. EXAM: RIGHT WRIST - COMPLETE 3+ VIEW COMPARISON:  None. FINDINGS: There is no evidence of fracture or dislocation. Cystic changes in the proximal lunate. There is chondrocalcinosis of the triangular fibrocartilage. Soft tissues are unremarkable. IMPRESSION: 1. No fracture or subluxation of the right wrist. 2. Chondrocalcinosis of the triangular fibrocartilage. Electronically Signed   By: Keith Rake M.D.   On: 01/23/2022 20:50  ? ?CT Head Wo Contrast ? ?Result Date: 01/23/2022 ?CLINICAL DATA:  Head trauma, moderate-severe.  Fall. EXAM: CT HEAD WITHOUT CONTRAST TECHNIQUE: Contiguous axial images were obtained from the base of the skull through the vertex without intravenous contrast. RADIATION DOSE REDUCTION: This exam was performed according to the departmental dose-optimization program which includes automated exposure control, adjustment of the mA  and/or kV according to patient size and/or use of iterative reconstruction technique. COMPARISON:  10/27/2018 FINDINGS: Brain: There is atrophy and chronic small vessel disease changes. No acute intracranial abnormality. Specifically, no hemorrhage, hydrocephalus, mass lesion, acute infarction, or significant intracranial injury. Vascular: No hyperdense vessel or unexpected calcification. Skull: No acute calvarial abnormality. Sinuses/Orbits: No acute findings Other: None IMPRESSION: Atrophy, chronic microvascular disease. No acute intracranial abnormality. Electronically Signed   By: Rolm Baptise M.D.   On: 01/23/2022 20:27  ? ?MR BRAIN WO CONTRAST ? ?Result Date: 01/24/2022 ?CLINICAL DATA:  Dizziness.  Recent fall EXAM: MRI HEAD WITHOUT CONTRAST TECHNIQUE: Multiplanar, multiecho pulse sequences of the brain and surrounding structures were  obtained without intravenous contrast. COMPARISON:  CT head 01/23/2022.  MRI head 07/29/2016 FINDINGS: Brain: Moderate atrophy and moderate chronic microvascular ischemic change with progression since 2017. Mild chronic microvascular ischemic change in the pons. Negative for acute infarct or mass. Chronic microhemorrhage in the right occipital lobe and right temporal lobe, new since 2017 Vascular: Normal arterial flow voids at the skull base Skull and upper cervical spine: No focal skeletal lesion. Sinuses/Orbits: Paranasal sinuses clear. Bilateral cataract extraction Other: None IMPRESSION: Negative for acute infarct Progressive atrophy and chronic microvascular ischemia. Progressive chronic microhemorrhage right temporal lobe and right occipital lobe since 2017. Electronically Signed   By: Franchot Gallo M.D.   On: 01/24/2022 14:19  ? ?DG Chest Portable 1 View ? ?Result Date: 01/23/2022 ?CLINICAL DATA:  Fall while ambulating with walker. EXAM: PORTABLE CHEST 1 VIEW COMPARISON:  01/02/2022 FINDINGS: Stable mild cardiomegaly. Unchanged mediastinal contours. Aortic atherosclerosis.  No pneumothorax or large pleural effusion. No focal airspace disease. Right posterior seventh and eighth rib fractures are remote and were present on prior exam. The bones are under mineralized. IMPRESSI

## 2022-01-24 NOTE — Progress Notes (Signed)
Daughter Learta Codding cell  [phone] is in the PRE-OP Room 37 with patient talking with Dr Orene Desanctis, surgeon.    ?

## 2022-01-24 NOTE — Anesthesia Preprocedure Evaluation (Addendum)
Anesthesia Evaluation  ?Patient identified by MRN, date of birth, ID band ?Patient awake ? ? ? ?Reviewed: ?Allergy & Precautions, H&P , NPO status , Patient's Chart, lab work & pertinent test results, reviewed documented beta blocker date and time  ? ?Airway ?Mallampati: III ? ?TM Distance: >3 FB ?Neck ROM: Full ? ? ? Dental ? ?(+) Edentulous Upper, Missing, Dental Advisory Given,  ?  ?Pulmonary ?neg pulmonary ROS,  ?  ?Pulmonary exam normal ?breath sounds clear to auscultation ? ? ? ? ? ? Cardiovascular ?hypertension, Pt. on medications and Pt. on home beta blockers ? ?Rhythm:Regular Rate:Normal ?+ Systolic murmurs ? ?  ?Neuro/Psych ?negative neurological ROS ? negative psych ROS  ? GI/Hepatic ?negative GI ROS, Neg liver ROS,   ?Endo/Other  ?Hypothyroidism  ? Renal/GU ?negative Renal ROS  ?negative genitourinary ?  ?Musculoskeletal ? ? Abdominal ?  ?Peds ? Hematology ?negative hematology ROS ?(+)   ?Anesthesia Other Findings ? ? Reproductive/Obstetrics ?negative OB ROS ? ?  ? ? ? ? ? ? ? ? ? ? ? ? ? ?  ?  ? ? ? ? ? ? ? ?Anesthesia Physical ?Anesthesia Plan ? ?ASA: 2 ? ?Anesthesia Plan: Regional and MAC  ? ?Post-op Pain Management: Minimal or no pain anticipated and Regional block*  ? ?Induction: Intravenous ? ?PONV Risk Score and Plan: 2 and Propofol infusion and Treatment may vary due to age or medical condition ? ?Airway Management Planned: Natural Airway and Simple Face Mask ? ?Additional Equipment:  ? ?Intra-op Plan:  ? ?Post-operative Plan:  ? ?Informed Consent: I have reviewed the patients History and Physical, chart, labs and discussed the procedure including the risks, benefits and alternatives for the proposed anesthesia with the patient or authorized representative who has indicated his/her understanding and acceptance.  ? ? ? ?Dental advisory given ? ?Plan Discussed with: CRNA ? ?Anesthesia Plan Comments:   ? ? ? ? ? ?Anesthesia Quick Evaluation ? ?

## 2022-01-24 NOTE — Progress Notes (Signed)
Pt admitted to [facility] 5W room 33 from [facility] via care link, skin clean dry and intact with notable bruising  under ace wrap. Pt. Oriented to room and unit.[facility] admits notified of pts arrival, pt and family updated on plan of care and all questions answered  BP (!) 157/78 (BP Location: Right Arm)   Pulse 76   Temp (!) 97.5 ?F (36.4 ?C) (Oral)   Resp 19   Ht 5' (1.524 m)   Wt 45.4 kg   SpO2 100%   BMI 19.55 kg/m?   ?

## 2022-01-25 ENCOUNTER — Inpatient Hospital Stay (HOSPITAL_COMMUNITY): Payer: Medicare Other

## 2022-01-25 DIAGNOSIS — S62102B Fracture of unspecified carpal bone, left wrist, initial encounter for open fracture: Secondary | ICD-10-CM | POA: Diagnosis not present

## 2022-01-25 LAB — CBC
HCT: 36.2 % (ref 36.0–46.0)
Hemoglobin: 11.9 g/dL — ABNORMAL LOW (ref 12.0–15.0)
MCH: 32.3 pg (ref 26.0–34.0)
MCHC: 32.9 g/dL (ref 30.0–36.0)
MCV: 98.4 fL (ref 80.0–100.0)
Platelets: 126 10*3/uL — ABNORMAL LOW (ref 150–400)
RBC: 3.68 MIL/uL — ABNORMAL LOW (ref 3.87–5.11)
RDW: 13.6 % (ref 11.5–15.5)
WBC: 9.8 10*3/uL (ref 4.0–10.5)
nRBC: 0 % (ref 0.0–0.2)

## 2022-01-25 LAB — COMPREHENSIVE METABOLIC PANEL
ALT: 93 U/L — ABNORMAL HIGH (ref 0–44)
AST: 277 U/L — ABNORMAL HIGH (ref 15–41)
Albumin: 2.6 g/dL — ABNORMAL LOW (ref 3.5–5.0)
Alkaline Phosphatase: 139 U/L — ABNORMAL HIGH (ref 38–126)
Anion gap: 7 (ref 5–15)
BUN: 11 mg/dL (ref 8–23)
CO2: 21 mmol/L — ABNORMAL LOW (ref 22–32)
Calcium: 8 mg/dL — ABNORMAL LOW (ref 8.9–10.3)
Chloride: 104 mmol/L (ref 98–111)
Creatinine, Ser: 0.83 mg/dL (ref 0.44–1.00)
GFR, Estimated: >60 mL/min (ref >60)
Glucose, Bld: 142 mg/dL — ABNORMAL HIGH (ref 70–99)
Potassium: 3.7 mmol/L (ref 3.5–5.1)
Sodium: 132 mmol/L — ABNORMAL LOW (ref 135–145)
Total Bilirubin: 1.2 mg/dL (ref 0.3–1.2)
Total Protein: 5.2 g/dL — ABNORMAL LOW (ref 6.5–8.1)

## 2022-01-25 LAB — PROTIME-INR
INR: 1.2 (ref 0.8–1.2)
Prothrombin Time: 15 seconds (ref 11.4–15.2)

## 2022-01-25 LAB — TYPE AND SCREEN
ABO/RH(D): O POS
Antibody Screen: NEGATIVE

## 2022-01-25 MED ORDER — HEPARIN SODIUM (PORCINE) 5000 UNIT/ML IJ SOLN
5000.0000 [IU] | Freq: Three times a day (TID) | INTRAMUSCULAR | Status: DC
  Administered 2022-01-25 – 2022-01-31 (×18): 5000 [IU] via SUBCUTANEOUS
  Filled 2022-01-25 (×18): qty 1

## 2022-01-25 MED ORDER — CEPHALEXIN 500 MG PO CAPS
500.0000 mg | ORAL_CAPSULE | Freq: Three times a day (TID) | ORAL | Status: DC
  Administered 2022-01-25 – 2022-01-26 (×5): 500 mg via ORAL
  Filled 2022-01-25 (×6): qty 1

## 2022-01-25 MED ORDER — MECLIZINE HCL 12.5 MG PO TABS
12.5000 mg | ORAL_TABLET | Freq: Three times a day (TID) | ORAL | Status: DC
  Administered 2022-01-25 – 2022-01-31 (×19): 12.5 mg via ORAL
  Filled 2022-01-25 (×24): qty 1

## 2022-01-25 NOTE — Progress Notes (Signed)
Inpatient Rehab Admissions: ? ?Inpatient Rehab Consult received.  I met with pt and daughter [name] at the bedside for rehabilitation assessment and to discuss goals and expectations of an inpatient rehab admission.  Daughter acknowledged understanding of CIR goals and expectations. Daughter interested in pt pursuing CIR. Discussed uncertainty of insurance approving a CIR admission. Daughter would still like to pursue. Will continue to follow. ? ? ? ?Signed: ?Gayland Curry, MS, CCC-SLP ?Admissions Coordinator ?[phone] ? ? ?

## 2022-01-25 NOTE — Evaluation (Signed)
Occupational Therapy Evaluation ?Patient Details ?Name: Mackenzie Madden ?MRN: [id number] ?DOB: August 15, 1928 ?Today's Date: 01/25/2022 ? ? ?History of Present Illness Pt is 88 yo female admitted on 01/23/22 after fall with L distal radius and ulna fx.  Pt is s/p ORIF on 02/23/22 with splint.  Pt also reports some vertigo for 2 weeks.  Pt with hx of HTN, hypothyroidism, hip fx a couple years ago.  ? ?Clinical Impression ?  ?Mackenzie Madden currently needs mod to max assist for UB selfcare and max assist for LB selfcare sit to stand including toilet transfers stand pivot.  She exhibits increased pain in the left arm with decreased AROM at the digits and slight edema.  Feel she will benefit from acute care OT to address these deficits and increase independence for return home with her spouse and daughter providing 24 hr assist.  Recommend AIR level rehab to reach supervision/min assist level goals for home to reduce burden of care.  Pt and family are in agreement with this option.    ?   ? ?Recommendations for follow up therapy are one component of a multi-disciplinary discharge planning process, led by the attending physician.  Recommendations may be updated based on patient status, additional functional criteria and insurance authorization.  ? ?Follow Up Recommendations ? Acute inpatient rehab (3hours/day)  ?  ?Assistance Recommended at Discharge Frequent or constant Supervision/Assistance  ?Patient can return home with the following A little help with walking and/or transfers;A little help with bathing/dressing/bathroom;Assistance with cooking/housework;Direct supervision/assist for financial management;Help with stairs or ramp for entrance;Assist for transportation ? ?  ?Functional Status Assessment ? Patient has had a recent decline in their functional status and demonstrates the ability to make significant improvements in function in a reasonable and predictable amount of time.  ?Equipment Recommendations ? Other  (comment) (TBD next venue of care)  ?  ?Recommendations for Other Services Rehab consult ? ? ?  ?Precautions / Restrictions Precautions ?Precautions: Fall ?Required Braces or Orthoses: Splint/Cast ?Splint/Cast: L wrist ?Restrictions ?Weight Bearing Restrictions: Yes ?LUE Weight Bearing: Non weight bearing ?Other Position/Activity Restrictions: NWB hand and elbow per ortho  ? ?  ? ?Mobility Bed Mobility ?Overal bed mobility: Needs Assistance ?Bed Mobility: Supine to Sit, Sit to Supine ?  ?  ?Supine to sit: Mod assist ?Sit to supine: Mod assist ?  ?  ?  ? ?Transfers ?Overall transfer level: Needs assistance ?Equipment used: 1 person hand held assist ?Transfers: Sit to/from Stand, Bed to chair/wheelchair/BSC ?Sit to Stand: Max assist ?  ?  ?Step pivot transfers: Max assist ?  ?  ?General transfer comment: Moderate posterior lean noted with sit to stand and stand pivot transfers to the [facility]. ?  ? ?  ?Balance Overall balance assessment: Needs assistance ?Sitting-balance support: No upper extremity supported ?Sitting balance-Leahy Scale: Fair ?Sitting balance - Comments: slow posterior lean noted ?  ?Standing balance support: Single extremity supported ?Standing balance-Leahy Scale: Poor ?Standing balance comment: Pt needs max assist from therapist to maintain balance. ?  ?  ?  ?  ?  ?  ?  ?  ?  ?  ?  ?   ? ?ADL either performed or assessed with clinical judgement  ? ?ADL Overall ADL's : Needs assistance/impaired ?Eating/Feeding: Set up;Sitting ?  ?Grooming: Wash/dry hands;Wash/dry face;Minimal assistance;Sitting ?  ?Upper Body Bathing: Moderate assistance;Sitting ?  ?Lower Body Bathing: Maximal assistance;Sit to/from stand ?  ?Upper Body Dressing : Maximal assistance;Sitting ?Upper Body Dressing Details (indicate cue type and reason):  including sling ?Lower Body Dressing: Maximal assistance;Sit to/from stand ?  ?Toilet Transfer: Maximal assistance;Stand-pivot;BSC/3in1 ?  ?Toileting- Clothing Manipulation and Hygiene:  Maximal assistance;Sit to/from stand ?  ?  ?  ?Functional mobility during ADLs: Maximal assistance (stand pivot only) ?General ADL Comments: Pt wearing sling on the LUE for comfort.  Removed and donned sling X 2 to look at ROM on the left arm as well as for changing gown secondary to stress incontinence when standing.  increeased posterior lean in standing requiring max assist to maintain balance.  Pt reports that her spouse has been assisting her with selfcare tasks lately as well.  No significant dizziness reported with session, but slight dizziness only with squating down to complete front peri cleaning after toileting.  ? ? ? 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   ?Perception Perception ?Perception: Within Functional Limits ?  ?Praxis Praxis ?Praxis: Intact ?  ? ?Pertinent Vitals/Pain Pain Assessment ?Faces Pain Scale: Hurts little more ?Pain Location: L wrist ?Pain Descriptors / Indicators: Discomfort, Grimacing ?Pain Intervention(s): Monitored during session, Repositioned  ? ? ? ?Hand Dominance Right ?  ?Extremity/Trunk Assessment Upper Extremity Assessment ?Upper Extremity Assessment: LUE deficits/detail ?LUE Deficits / Details: LUE splinted from forearm to MPs.  Decreased AROM digit flexion/extension to approximately 50% within the confines of the splint.  Elbow AAROM WFLS with shoulder flexion 0-120 degrees AAROM as well.  Pt with pain in the thumb with flexion as well as forearm when attempting to support her for AAROM. ?LUE Sensation: WNL ?LUE Coordination: decreased fine motor;decreased gross motor ?  ?Lower Extremity Assessment ?Lower Extremity Assessment: LLE deficits/detail;RLE deficits/detail ?RLE Deficits / Details: ROM WFL; MMT 4/5 ?LLE Deficits / Details: ROM WFL; MMT 4/5 ?   ?Cervical / Trunk Assessment ?Cervical / Trunk Assessment: Kyphotic ?  ?Communication Communication ?Communication: HOH ?  ?Cognition Arousal/Alertness: Awake/alert ?Behavior During Therapy: [facility] for tasks assessed/performed ?Overall Cognitive Status: Within Functional Limits for tasks assessed ?  ?  ?  ?  ?  ?  ?  ?  ?  ?  ?  ?  ?  ?  ?  ?  ?General Comments: HOH with cognition at baseline for age ?  ?  ?   ?   ?   ? ? ?Home Living Family/patient expects to be discharged to:: Private residence ?Living Arrangements: Spouse/significant other ?Available Help at Discharge: Family;Available 24 hours/day ?Type of Home: House ?Home Access: Ramped entrance ?  ?  ?Home Layout: One level ?  ?  ?Bathroom Shower/Tub: Walk-in shower ?  ?Bathroom Toilet: Handicapped height ?Bathroom Accessibility: Yes ?How Accessible: Accessible via walker ?Home Equipment: Rollator (4 wheels);Shower seat;BSC/3in1 ?  ?  ?  ? ?  ?Prior Functioning/Environment Prior Level of Function : Needs assist ?  ?  ?  ?  ?  ?  ?Mobility Comments: Walks with rollators; goes up/down driveway ?ADLs Comments: Pt independent with dressing and toileting, can do light IADLs; spouse assist with showers (pt sits on shower chair and he helps with back and legs) ?  ? ?  ?  ?OT Problem List: Decreased strength;Decreased range of motion;Impaired balance (sitting and/or standing);Pain;Impaired UE functional use;Decreased knowledge of use of DME or AE ?  ?   ?OT Treatment/Interventions: Self-care/ADL training;DME and/or AE instruction;Therapeutic activities;Balance training;Patient/family education;Therapeutic exercise  ?  ?  OT Goals(Current goals can be found in the care plan section) Acute Rehab OT Goals ?Patient Stated Goal: Pt did not state, but her daughter wants her to go to rehab before home. ?OT Goal Formulation: With patient ?Time For Goal Achievement: 01/25/22 ?Potential to Achieve Goals: Good  ?OT Frequency: Min 2X/week ?  ? ?   ?AM-PAC OT "6 Clicks" Daily  Activity     ?Outcome Measure Help from another person eating meals?: A Little ?Help from another person taking care of personal grooming?: A Little ?Help from another person toileting, which includes using toliet, be

## 2022-01-25 NOTE — Progress Notes (Signed)
? ?  Inpatient Rehab Admissions Coordinator : ? ?Per therapy recommendations, patient was screened for CIR candidacy by Nemesis Rainwater RN MSN.  At this time patient appears to be a potential candidate for CIR. I will place a rehab consult per protocol for full assessment. Please call me with any questions. ? ?Kamari Bilek RN MSN ?Admissions Coordinator ?[phone] ?  ?

## 2022-01-25 NOTE — Evaluation (Signed)
Physical Therapy Evaluation ?Patient Details ?Name: Mackenzie Madden ?MRN: [id number] ?DOB: 1928-01-24 ?Today's Date: 01/25/2022 ? ?History of Present Illness ? Pt is 88 yo female admitted on 01/23/22 after fall with L distal radius and ulna fx.  Pt is s/p ORIF on 02/23/22 with splint.  Pt also reports some vertigo for 2 weeks.  Pt with hx of HTN, hypothyroidism, hip fx a couple years ago.  ?Clinical Impression ? Pt admitted with above diagnosis. At baseline, pt is ambulatory with rollator and has some assist with ADLs.  Pt lives with spouse but daughter is retired and can assist as needed.  Today, pt requiring mod A for transfers and only able to take a few shuffle steps.  She normally uses a rollator and now is only able to use single UE for support limiting her balance.  In regards to vertigo testing: all BPPV test negative, orthostatic BP negative, pt only experienced mild "spinning" upon sitting that eased quickly, vestibular ocularmotor exam unremarkable, pt does report long standing hx of double vision with prism recently added to glasses that is helping.  Daughter expressed that pt did go home after her hip fx a couple years ago with her assist - she would like this again but does not feel that pt is moving well enough yet.  Discussed recommendation for rehab at this time and could update if needed. Pt currently with functional limitations due to the deficits listed below (see PT Problem List). Pt will benefit from skilled PT to increase their independence and safety with mobility to allow discharge to the venue listed below.   ?   ?   ? ?Recommendations for follow up therapy are one component of a multi-disciplinary discharge planning process, led by the attending physician.  Recommendations may be updated based on patient status, additional functional criteria and insurance authorization. ? ?Follow Up Recommendations Acute inpatient rehab (3hours/day) ? ?  ?Assistance Recommended at Discharge Frequent or  constant Supervision/Assistance  ?Patient can return home with the following ? A lot of help with walking and/or transfers;A lot of help with bathing/dressing/bathroom;Assistance with cooking/housework ? ?  ?Equipment Recommendations Other (comment);Wheelchair (measurements PT);Wheelchair cushion (measurements PT) (Hemiwalker vs quad cane pending progress)  ?Recommendations for Other Services ? Rehab consult  ?  ?Functional Status Assessment Patient has had a recent decline in their functional status and demonstrates the ability to make significant improvements in function in a reasonable and predictable amount of time.  ? ?  ?Precautions / Restrictions Precautions ?Precautions: Fall ?Required Braces or Orthoses: Splint/Cast ?Splint/Cast: L wrist ?Restrictions ?Weight Bearing Restrictions: Yes ?LUE Weight Bearing: Non weight bearing ?Other Position/Activity Restrictions: NWB hand and elbow per ortho  ? ?  ? ?Mobility ? Bed Mobility ?Overal bed mobility: Needs Assistance ?Bed Mobility: Supine to Sit, Sit to Supine ?  ?  ?Supine to sit: Mod assist ?Sit to supine: Mod assist ?  ?General bed mobility comments: increased time and cues with assist for legs and trunk ?  ? ?Transfers ?Overall transfer level: Needs assistance ?Equipment used: 1 person hand held assist ?Transfers: Sit to/from Stand, Bed to chair/wheelchair/BSC ?Sit to Stand: Mod assist ?  ?Step pivot transfers: Mod assist ?  ?  ?  ?General transfer comment: Pt stood from bed and from [facility] with cues to push with R UE, L UE in sling, and mod A to rise.  Pt taking shuffle steps to and from bsc with mod A for balance (pt holding therapist arm, therapist holding belt and  supporting at L elbow) ?  ? ?Ambulation/Gait ?Ambulation/Gait assistance: Mod assist ?Gait Distance (Feet): 2 Feet (2'x2) ?  ?  ?  ?  ?  ?General Gait Details: shuffle steps for transfers (see above) ? ?Stairs ?  ?  ?  ?  ?  ? ?Wheelchair Mobility ?  ? ?Modified Rankin (Stroke Patients Only) ?  ? ?   ? ?Balance Overall balance assessment: Needs assistance ?Sitting-balance support: No upper extremity supported ?Sitting balance-Leahy Scale: Good ?  ?  ?Standing balance support: Single extremity supported ?Standing balance-Leahy Scale: Poor ?Standing balance comment: Requiring UE support and mod A ?  ?  ?  ?  ?  ?  ?  ?  ?  ?  ?  ?   ? ? ? ?Pertinent Vitals/Pain Pain Assessment ?Pain Assessment: Faces ?Faces Pain Scale: Hurts little more ?Pain Location: L wrist ?Pain Descriptors / Indicators: Discomfort ?Pain Intervention(s): Limited activity within patient's tolerance, Monitored during session, Ice applied  ? ? ?Home Living Family/patient expects to be discharged to:: Private residence ?Living Arrangements: Spouse/significant other ?Available Help at Discharge: Family;Available 24 hours/day ?Type of Home: House ?Home Access: Ramped entrance ?  ?  ?  ?Home Layout: One level ?Home Equipment: Rollator (4 wheels);Shower seat;BSC/3in1 ?   ?  ?Prior Function Prior Level of Function : Needs assist ?  ?  ?  ?  ?  ?  ?Mobility Comments: Walks with rollators; goes up/down driveway ?ADLs Comments: Pt independent with dressing and toileting, can do light IADLs; spouse assist with showers (pt sits on shower chair and he helps with back and legs) ?  ? ? ?Hand Dominance  ?   ? ?  ?Extremity/Trunk Assessment  ? Upper Extremity Assessment ?Upper Extremity Assessment: LUE deficits/detail;Defer to OT evaluation ?LUE Deficits / Details: in sling and splint; hand with edema - encouraged finger flex/ext ?  ? ?Lower Extremity Assessment ?Lower Extremity Assessment: LLE deficits/detail;RLE deficits/detail ?RLE Deficits / Details: ROM WFL; MMT 4/5 ?LLE Deficits / Details: ROM WFL; MMT 4/5 ?  ? ?Cervical / Trunk Assessment ?Cervical / Trunk Assessment: Normal  ?Communication  ? Communication: HOH  ?Cognition Arousal/Alertness: Awake/alert ?Behavior During Therapy: [facility] for tasks assessed/performed ?Overall Cognitive Status: Within  Functional Limits for tasks assessed ?  ?  ?  ?  ?  ?  ?  ?  ?  ?  ?  ?  ?  ?  ?  ?  ?General Comments: HOH ?  ?  ? ?  ?General Comments  Vestibular: ?History: ?Pt reports on/off "spinning" for a few weeks.  Pt is somewhat vague historian.  Reports normally when she first gets up will spin for a few seconds.  She does have long standing hx of double vision with prism recently added to glasses L eye that has helped.  Does not feel that it worsened her dizziness.  ? ?BPPV: ?Harland Dingwall: negative bilaterally (used bed functions to achieve -30 degrees but pt with slower transitions to positions due to mobility) ?Horizontal: negative ? ?Oculomotor exam: ?Saccades: intact ?EOEM: intact ?VOR: intact ?Test of Skew: negative ?Head Thrust: negative ? ?Orthostatic BP: stable; all positions 140's/60's ? ? ?  ?Exercises    ? ?Assessment/Plan  ?  ?PT Assessment Patient needs continued PT services  ?PT Problem List Decreased strength;Decreased mobility;Decreased range of motion;Decreased activity tolerance;Decreased balance;Decreased knowledge of use of DME;Pain;Decreased coordination;Decreased knowledge of precautions ? ?   ?  ?PT Treatment Interventions DME instruction;Therapeutic activities;Gait training;Therapeutic exercise;Patient/family education;Balance  training;Functional mobility training;Wheelchair mobility training;Modalities   ? ?PT Goals (Current goals can be found in the Care Plan section)  ?Acute Rehab PT Goals ?Patient Stated Goal: return home ?PT Goal Formulation: With patient ?Time For Goal Achievement: 02/08/22 ?Potential to Achieve Goals: Good ? ?  ?Frequency Min 5X/week ?  ? ? ?Co-evaluation   ?  ?  ?  ?  ? ? ?  ?AM-PAC PT "6 Clicks" Mobility  ?Outcome Measure Help needed turning from your back to your side while in a flat bed without using bedrails?: A Lot ?Help needed moving from lying on your back to sitting on the side of a flat bed without using bedrails?: A Lot ?Help needed moving to and from a bed to  a chair (including a wheelchair)?: A Lot ?Help needed standing up from a chair using your arms (e.g., wheelchair or bedside chair)?: A Lot ?Help needed to walk in hospital room?: Total ?Help needed climbing 3-

## 2022-01-25 NOTE — TOC CAGE-AID Note (Signed)
Transition of Care (TOC) - CAGE-AID Screening ? ? ?Patient Details  ?Name: STEVE GREGG ?MRN: [id number] ?Date of Birth: 05-11-1928 ? ?Transition of Care (TOC) CM/SW Contact:    ?Brinton Brandel C Tarpley-Carter, LCSWA ?Phone Number: ?01/25/2022, 11:10 AM ? ? ?Clinical Narrative: ?Pt participated in [place].  Pt stated she does not use substance or ETOH.  Pt was not offered resources, due to no usage of substance or ETOH.   ? ?Passenger transport manager, MSW, LCSW-A ?Pronouns:  She/Her/Hers ?[facility] of [facility] ?Clinical Social Worker ?Direct Number:  [phone] ?[email or web address] ? ?CAGE-AID Screening: ?  ? ?Have You Ever Felt You Ought to Cut Down on Your Drinking or Drug Use?: No ?Have People Annoyed You By Critizing Your Drinking Or Drug Use?: No ?Have You Felt Bad Or Guilty About Your Drinking Or Drug Use?: No ?Have You Ever Had a Drink or Used Drugs First Thing In The Morning to Steady Your Nerves or to Get Rid of a Hangover?: No ?CAGE-AID Score: 0 ? ?Substance Abuse Education Offered: No ? ?  ? ? ? ? ? ? ?

## 2022-01-25 NOTE — Progress Notes (Addendum)
?                                  PROGRESS NOTE                                             ?                                                                                                                     ?                                         ? ? Patient Demographics:  ? ? Mackenzie Madden, is a 88 y.o. female, DOB - Jan 06, 1928, IHK:[id number] ? ?Outpatient Primary MD for the patient is Lavone Orn, MD    LOS - 1  Admit date - 01/23/2022   ? ?Chief Complaint  ?Patient presents with  ? Fall  ?    ? ?Brief Narrative (HPI from H&P)   88 y.o. female with medical history significant of  hypertension, hypothyroidism was transferred from [facility] ED after experiencing a ground-level fall and was noted to have left wrist fracture.  Patient had been having vertigo or the last 2 weeks.  She was admitted for her left wrist fracture repair. ? ? Subjective:  ? ? Calton Dach today has, No headache, No chest pain, No abdominal pain - No Nausea, No new weakness tingling or numbness, no SOB. ? ? Assessment  & Plan :  ? ? ?Vertigo related fall with left distal radius open fracture.  S/p surgical fixation by hand surgeon Dr. Greta Doom on 01/25/2022, left arm in splint, seems to have tolerated the procedure well, elderly patient who lives with her husband cannot care for herself right now.  PT OT and likely will require placement as well ? ? 2. Ongoing vertigo for several weeks.  Could be due to BPV, CT and MRI brain nonacute.  Add meclizine . ? ?3.  Essential hypertension.  On Coreg. ? ?4.  Hypothyroidism.  On Synthroid. ? ?5.  UTI present on admission.  Keflex monitor cultures. ? ?6.  Asymptomatic transaminitis.  Monitor trend, INR stable, will check right upper quadrant ultrasound as well. ? ?   ? ?Condition - Fair ? ?Family Communication  : Husband bedside on 01/25/2022 ? ?Code Status :  Full ? ?Consults  :  Hand surgery ? ?PUD Prophylaxis :  ? ? Procedures  :    ? ?Right upper  quadrant ultrasound. ? ?CT head and MRI brain.  Nonacute. ? ?ORIF left wrist by hand surgery on 01/25/2019 - Dr Greta Doom ? ? LEFT distal radius open reduction internal fixation, INTRA- articular, 3 fragments ?LEFT  ?2.  LEFT wrist brachioradialis tenotomy  ?3.    LEFT wrist radiographs four views with intraoperative interpretation ? ?   ? ?Disposition Plan  :   ? ?Status is: Inpatient ? ?DVT Prophylaxis  :  Heparin 01/25/22 ? ?Place and maintain sequential compression device Start: 01/24/22 1554 ? ?Lab Results  ?Component Value Date  ? PLT 126 (L) 01/25/2022  ? ? ?Diet :  ?Diet Order   ? ?       ?  Diet regular Room service appropriate? Yes; Fluid consistency: Thin  Diet effective now       ?  ? ?  ?  ? ?  ?  ? ?Inpatient Medications ? ?Scheduled Meds: ? calcium carbonate  1,250 mg Oral Q breakfast  ? cephALEXin  500 mg Oral Q8H  ? docusate sodium  100 mg Oral BID  ? dorzolamide-timolol  1 drop Both Eyes BID  ? latanoprost  1 drop Both Eyes QHS  ? levothyroxine  88 mcg Oral QAC breakfast  ? multivitamin with minerals  1 tablet Oral Daily  ? ?Continuous Infusions: ? sodium chloride 100 mL/hr at 01/25/22 0514  ? ?PRN Meds:.acetaminophen **OR** acetaminophen, hydrALAZINE, HYDROmorphone (DILAUDID) injection, ondansetron **OR** ondansetron (ZOFRAN) IV, polyethylene glycol ? ? ? Time Spent in minutes  30 ? ? ?Lala Lund M.D on 01/25/2022 at 9:03 AM ? ?To page go to [email or web address]  ? ?[facility] -  Office  [phone] ? ?See all Orders from today for further details ? ? ? Objective:  ? ?Vitals:  ? 01/25/22 0000 01/25/22 0400 01/25/22 0500 01/25/22 0802  ?BP: (!) 150/76 (!) 157/78  129/90  ?Pulse: 81 88  93  ?Resp: (!) 23 (!) 23  19  ?Temp: 97.7 ?F (36.5 ?C) 98 ?F (36.7 ?C)  98 ?F (36.7 ?C)  ?TempSrc: Oral Axillary  Oral  ?SpO2: 95% 95%  94%  ?Weight:   42.1 kg   ?Height:      ? ? ?Wt Readings from Last 3 Encounters:  ?01/25/22 42.1 kg  ?01/02/22 45.4 kg  ?08/05/19 44.9 kg  ? ? ? ?Intake/Output Summary (Last 24  hours) at 01/25/2022 0903 ?Last data filed at 01/24/2022 2200 ?Gross per 24 hour  ?Intake 1100.32 ml  ?Output 425 ml  ?Net 675.32 ml  ? ? ? ?Physical Exam ? ?Awake Alert, No new F.N deficits,  ?[place].AT,PERRAL ?Supple Neck, No JVD,   ?Symmetrical Chest wall movement, Good air movement bilaterally, CTAB ?RRR,No Gallops,Rubs or new Murmurs,  ?+ve B.Sounds, Abd Soft, No tenderness,   ?No Cyanosis, L arm in sling ?  ? ? Data Review:  ? ? ?CBC ?Recent Labs  ?Lab 01/23/22 ?1906 01/25/22 ?0101  ?WBC 9.0 9.8  ?HGB 13.2 11.9*  ?HCT 40.2 36.2  ?PLT 145* 126*  ?MCV 95.9 98.4  ?MCH 31.5 32.3  ?MCHC 32.8 32.9  ?RDW 13.9 13.6  ? ? ?Electrolytes ?Recent Labs  ?Lab 01/23/22 ?1906 01/25/22 ?0101  ?NA 132* 132*  ?K 4.8 3.7  ?CL 99 104  ?CO2 26 21*  ?GLUCOSE 130* 142*  ?BUN 12 11  ?CREATININE 0.82 0.83  ?CALCIUM 9.8 8.0*  ?AST  --  277*  ?ALT  --  93*  ?ALKPHOS  --  139*  ?BILITOT  --  1.2  ?ALBUMIN  --  2.6*  ?INR  --  1.2  ? ? ?------------------------------------------------------------------------------------------------------------------ ?No results for input(s): CHOL, HDL, LDLCALC, TRIG, CHOLHDL, LDLDIRECT in the last 72 hours. ? ?No results found for: HGBA1C ? ?No results for input(s):  TSH, T4TOTAL, T3FREE, THYROIDAB in the last 72 hours. ? ?Invalid input(s): FREET3 ?------------------------------------------------------------------------------------------------------------------ ?ID Labs ?Recent Labs  ?Lab 01/23/22 ?1906 01/25/22 ?0101  ?WBC 9.0 9.8  ?PLT 145* 126*  ?CREATININE 0.82 0.83  ? ?Cardiac Enzymes ?No results for input(s): CKMB, TROPONINI, MYOGLOBIN in the last 168 hours. ? ?Invalid input(s): CK ? ? ?  ?Radiology Reports ?DG Wrist 2 Views Left ? ?Result Date: 01/23/2022 ?CLINICAL DATA:  Fall while ambulating with walker at home. Left wrist swelling and deformity. EXAM: LEFT WRIST - 2 VIEW COMPARISON:  None. FINDINGS: Comminuted and displaced distal radius fracture primarily involving the metaphysis. There is apex radial  angulation. Large amount of air in the soft tissues with skin irregularity and possible exposed bone suggesting open fracture. Fracture extends into the distal radioulnar joint. There is an oblique displaced fracture of the distal ulna with distal fracture fragments displaced about the volar aspect. The carpal bones remain aligned with the distal radius fracture fragment. 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 Comminuted, displaced and angulated distal radius fracture with intra-articular extension. Large amount of air in the soft tissues, skin irregularity with possible exposed bone suggesting open fracture. 2. Displaced angulated distal ulna fracture. Electronically Signed   By: Keith Rake M.D.   On: 01/23/2022 20:49  ? ?DG Wrist Complete Right ? ?Result Date: 01/23/2022 ?CLINICAL DATA:  Fall while ambulating with walker. Right wrist swelling. EXAM: RIGHT WRIST - COMPLETE 3+ VIEW COMPARISON:  None. FINDINGS: There is no evidence of fracture or dislocation. Cystic changes in the proximal lunate. There is chondrocalcinosis of the triangular fibrocartilage. Soft tissues are unremarkable. IMPRESSION: 1. No fracture or subluxation of the right wrist. 2. Chondrocalcinosis of the triangular fibrocartilage. Electronically Signed   By: Keith Rake M.D.   On: 01/23/2022 20:50  ? ?CT Head Wo Contrast ? ?Result Date: 01/23/2022 ?CLINICAL DATA:  Head trauma, moderate-severe.  Fall. EXAM: CT HEAD WITHOUT CONTRAST TECHNIQUE: Contiguous axial images were obtained from the base of the skull through the vertex without intravenous contrast. RADIATION DOSE REDUCTION: This exam was performed according to the departmental dose-optimization program which includes automated exposure control, adjustment of the mA and/or kV according to patient size and/or use of iterative reconstruction technique. COMPARISON:  10/27/2018 FINDINGS: Brain: There is atrophy and chronic small vessel disease changes. No  acute intracranial abnormality. Specifically, no hemorrhage, hydrocephalus, mass lesion, acute infarction, or significant intracranial injury. Vascular: No hyperdense vessel or unexpected calcification. Skull: No a

## 2022-01-26 ENCOUNTER — Encounter (HOSPITAL_COMMUNITY): Payer: Self-pay | Admitting: Orthopedic Surgery

## 2022-01-26 DIAGNOSIS — S62102B Fracture of unspecified carpal bone, left wrist, initial encounter for open fracture: Secondary | ICD-10-CM | POA: Diagnosis not present

## 2022-01-26 LAB — URINE CULTURE: Culture: >=100000 — AB

## 2022-01-26 LAB — COMPREHENSIVE METABOLIC PANEL
ALT: 39 U/L (ref 0–44)
AST: 67 U/L — ABNORMAL HIGH (ref 15–41)
Albumin: 2.2 g/dL — ABNORMAL LOW (ref 3.5–5.0)
Alkaline Phosphatase: 108 U/L (ref 38–126)
Anion gap: 4 — ABNORMAL LOW (ref 5–15)
BUN: 8 mg/dL (ref 8–23)
CO2: 23 mmol/L (ref 22–32)
Calcium: 7.7 mg/dL — ABNORMAL LOW (ref 8.9–10.3)
Chloride: 105 mmol/L (ref 98–111)
Creatinine, Ser: 0.66 mg/dL (ref 0.44–1.00)
GFR, Estimated: >60 mL/min (ref >60)
Glucose, Bld: 106 mg/dL — ABNORMAL HIGH (ref 70–99)
Potassium: 3.1 mmol/L — ABNORMAL LOW (ref 3.5–5.1)
Sodium: 132 mmol/L — ABNORMAL LOW (ref 135–145)
Total Bilirubin: 0.7 mg/dL (ref 0.3–1.2)
Total Protein: 4.7 g/dL — ABNORMAL LOW (ref 6.5–8.1)

## 2022-01-26 LAB — CBC WITH DIFFERENTIAL/PLATELET
Abs Immature Granulocytes: 0.03 10*3/uL (ref 0.00–0.07)
Basophils Absolute: 0 10*3/uL (ref 0.0–0.1)
Basophils Relative: 0 %
Eosinophils Absolute: 0.1 10*3/uL (ref 0.0–0.5)
Eosinophils Relative: 1 %
HCT: 30.1 % — ABNORMAL LOW (ref 36.0–46.0)
Hemoglobin: 10.1 g/dL — ABNORMAL LOW (ref 12.0–15.0)
Immature Granulocytes: 0 %
Lymphocytes Relative: 24 %
Lymphs Abs: 2.1 10*3/uL (ref 0.7–4.0)
MCH: 32 pg (ref 26.0–34.0)
MCHC: 33.6 g/dL (ref 30.0–36.0)
MCV: 95.3 fL (ref 80.0–100.0)
Monocytes Absolute: 1.4 10*3/uL — ABNORMAL HIGH (ref 0.1–1.0)
Monocytes Relative: 16 %
Neutro Abs: 5 10*3/uL (ref 1.7–7.7)
Neutrophils Relative %: 59 %
Platelets: 119 10*3/uL — ABNORMAL LOW (ref 150–400)
RBC: 3.16 MIL/uL — ABNORMAL LOW (ref 3.87–5.11)
RDW: 13.4 % (ref 11.5–15.5)
WBC: 8.7 10*3/uL (ref 4.0–10.5)
nRBC: 0 % (ref 0.0–0.2)

## 2022-01-26 LAB — BRAIN NATRIURETIC PEPTIDE: B Natriuretic Peptide: 630.4 pg/mL — ABNORMAL HIGH (ref 0.0–100.0)

## 2022-01-26 LAB — MAGNESIUM: Magnesium: 1.5 mg/dL — ABNORMAL LOW (ref 1.7–2.4)

## 2022-01-26 MED ORDER — POTASSIUM CHLORIDE CRYS ER 20 MEQ PO TBCR
20.0000 meq | EXTENDED_RELEASE_TABLET | Freq: Once | ORAL | Status: DC
  Filled 2022-01-26: qty 1

## 2022-01-26 MED ORDER — POTASSIUM CHLORIDE CRYS ER 20 MEQ PO TBCR
40.0000 meq | EXTENDED_RELEASE_TABLET | Freq: Once | ORAL | Status: AC
  Administered 2022-01-26: 40 meq via ORAL
  Filled 2022-01-26: qty 2

## 2022-01-26 MED ORDER — MAGNESIUM SULFATE 4 GM/100ML IV SOLN
4.0000 g | Freq: Once | INTRAVENOUS | Status: AC
  Administered 2022-01-26: 4 g via INTRAVENOUS
  Filled 2022-01-26: qty 100

## 2022-01-26 MED ORDER — POTASSIUM CHLORIDE 20 MEQ PO PACK
20.0000 meq | PACK | ORAL | Status: AC
  Administered 2022-01-26 – 2022-01-27 (×3): 20 meq via ORAL
  Filled 2022-01-26 (×3): qty 1

## 2022-01-26 NOTE — Progress Notes (Signed)
Patient evaluated around 4pm.  ? ?Patient sitting up and comfortable however with hand hanging down in dependent position. Husband also at bedside playing cards with her. She is not confused and in good spirits.  ? ?Left wrist exam: ?Swelling present to dorsal hand and digits. Splint with some sanguinous drainage and appears a bit tight. This was loosened and patient had good relief. She was able to move her finger much better . Sensation intact to light touch witb brisk cap refill to all digits.  ? ?Continue nwb. Iv abx 48 hrs. Elevate and work on finger ROM. Carter pillow ordered and provided and to patient via ortho tech to improve elevation.  ? ?

## 2022-01-26 NOTE — NC FL2 (Signed)
?[place] MEDICAID FL2 LEVEL OF CARE SCREENING TOOL  ?  ? ?IDENTIFICATION  ?Patient Name: ?Mackenzie Madden Birthdate: [birth year] Sex: female Admission Date (Current Location): ?01/23/2022  ?[place] and [place] Number: ? [place] ?  Facility and Address:  ?[facility], [place] [address], [place], [place] [address] ?     Provider Number: ?[id number]  ?Attending Physician Name and Address:  ?Thurnell Lose, MD ? Relative Name and Phone Number:  ?  ?   ?Current Level of Care: ?Hospital Recommended Level of Care: ?[place] Prior Approval Number: ?  ? ?Date Approved/Denied: ?  PASRR Number: ?[id number] A ? ?Discharge Plan: ?SNF ?  ? ?Current Diagnoses: ?Patient Active Problem List  ? Diagnosis Date Noted  ? Open wrist fracture, left, initial encounter 01/24/2022  ? Vertigo 01/23/2022  ? Hip fracture ([place]) 08/04/2019  ? Closed right femoral fracture ([place]) 08/04/2019  ? Closed subcapital fracture of right femur ([place])   ? Pubic ramus fracture, left, closed, initial encounter ([place])   ? Leukocytosis   ? Pubic ramus fracture ([place]) 08/11/2018  ? Sacral fracture, closed ([place])   ? Fall   ? Acute lower UTI   ? Laceration of bladder   ? Abnormal transaminases 06/06/2016  ? Atypical chest pain 06/04/2016  ? Chest pain 06/04/2016  ? Faintness   ? Chronic diastolic heart failure ([place])   ? Intraocular pressure increase   ? Syncope 02/21/2015  ? HTN (hypertension) 02/21/2015  ? Hypothyroid 02/21/2015  ? ? ?Orientation RESPIRATION BLADDER Height & Weight   ?  ?Self, Time, Situation, Place ? Normal Incontinent, External catheter Weight: 108 lb 0.4 oz (49 kg) ?Height:  5' (152.4 cm)  ?BEHAVIORAL SYMPTOMS/MOOD NEUROLOGICAL BOWEL NUTRITION STATUS  ?    Continent Diet (See dc summary)  ?AMBULATORY STATUS COMMUNICATION OF NEEDS Skin   ?Limited Assist Verbally Surgical wounds (Closed incision on arm and hip) ?  ?  ?  ?    ?     ?     ? ? ?Personal Care Assistance Level of Assistance  ?Bathing, Feeding,  Dressing Bathing Assistance: Limited assistance ?Feeding assistance: Limited assistance ?Dressing Assistance: Limited assistance ?   ? ?Functional Limitations Info  ?Hearing   ?Hearing Info: Impaired ?   ? ? ?SPECIAL CARE FACTORS FREQUENCY  ?PT (By licensed PT), OT (By licensed OT)   ?  ?PT Frequency: 5x/week ?OT Frequency: 5x/week ?  ?  ?  ?   ? ? ?Contractures Contractures Info: Not present  ? ? ?Additional Factors Info  ?Code Status, Allergies Code Status Info: Full ?Allergies Info: NKA ?  ?  ?  ?   ? ?Current Medications (01/26/2022):  This is the current hospital active medication list ?Current Facility-Administered Medications  ?Medication Dose Route Frequency Provider Last Rate Last Admin  ? 0.9 %  sodium chloride infusion   Intravenous Continuous Pokhrel, Laxman, MD 100 mL/hr at 01/26/22 0657 New Bag at 01/26/22 0657  ? acetaminophen (TYLENOL) tablet 650 mg  650 mg Oral Q6H PRN Pokhrel, Laxman, MD   650 mg at 01/25/22 2054  ? Or  ? acetaminophen (TYLENOL) suppository 650 mg  650 mg Rectal Q6H PRN Pokhrel, Laxman, MD      ? calcium carbonate (OS-CAL - dosed in mg of elemental calcium) tablet 1,250 mg  1,250 mg Oral Q breakfast Pokhrel, Laxman, MD   1,250 mg at 01/26/22 1106  ? cephALEXin (KEFLEX) capsule 500 mg  500 mg Oral Q8H Singh, Prashant  K, MD   500 mg at 01/26/22 0263  ? docusate sodium (COLACE) capsule 100 mg  100 mg Oral BID Pokhrel, Laxman, MD   100 mg at 01/26/22 1106  ? dorzolamide-timolol (COSOPT) 22.3-6.8 MG/ML ophthalmic solution 1 drop  1 drop Both Eyes BID Pokhrel, Laxman, MD   1 drop at 01/26/22 1107  ? heparin injection 5,000 Units  5,000 Units Subcutaneous Q8H Thurnell Lose, MD   5,000 Units at 01/26/22 1447  ? hydrALAZINE (APRESOLINE) injection 10 mg  10 mg Intravenous Q6H PRN Pokhrel, Laxman, MD   10 mg at 01/25/22 1836  ? HYDROmorphone (DILAUDID) injection 0.5-1 mg  0.5-1 mg Intravenous Q2H PRN Pokhrel, Laxman, MD   0.5 mg at 01/26/22 0401  ? latanoprost (XALATAN) 0.005 % ophthalmic  solution 1 drop  1 drop Both Eyes QHS Pokhrel, Laxman, MD   1 drop at 01/25/22 2105  ? levothyroxine (SYNTHROID) tablet 88 mcg  88 mcg Oral QAC breakfast Flora Lipps, MD   88 mcg at 01/26/22 7858  ? meclizine (ANTIVERT) tablet 12.5 mg  12.5 mg Oral TID Thurnell Lose, MD   12.5 mg at 01/26/22 1106  ? multivitamin with minerals tablet 1 tablet  1 tablet Oral Daily Pokhrel, Laxman, MD   1 tablet at 01/26/22 1106  ? ondansetron (ZOFRAN) tablet 4 mg  4 mg Oral Q6H PRN Pokhrel, Laxman, MD      ? Or  ? ondansetron (ZOFRAN) injection 4 mg  4 mg Intravenous Q6H PRN Pokhrel, Laxman, MD      ? polyethylene glycol (MIRALAX / GLYCOLAX) packet 17 g  17 g Oral Daily PRN Pokhrel, Laxman, MD      ? potassium chloride (KLOR-CON) packet 20 mEq  20 mEq Oral Q4H Thurnell Lose, MD      ? ? ? ?Discharge Medications: ?Please see discharge summary for a list of discharge medications. ? ?Relevant Imaging Results: ? ?Relevant Lab Results: ? ? ?Additional Information ?SSn: [phone]. Pfizer vaccines 10/11/19, 11/11/19, 07/01/20 ? ?Lissa Morales Sidonia Nutter, LCSW ? ? ? ? ?

## 2022-01-26 NOTE — Anesthesia Postprocedure Evaluation (Signed)
Anesthesia Post Note ? ?Patient: YARETHZI BRANAN ? ?Procedure(s) Performed: IRRIGATION AND DEBRIDEMENT EXTREMITY;LEFT distal radius open reduction internal fixation, INTRA- articular, 3 fragments (Left: Wrist) ?CLOSED REDUCTION WRIST (Left: Wrist) ?OPEN REDUCTION INTERNA FIXATION OF LEFT WRIST FRACTURE; LEFT wrist radiographs four views with intraoperative interpretation (Left: Wrist) ? ?  ? ?Patient location during evaluation: PACU ?Anesthesia Type: Regional and MAC ?Level of consciousness: awake and alert ?Pain management: pain level controlled ?Vital Signs Assessment: post-procedure vital signs reviewed and stable ?Respiratory status: spontaneous breathing, nonlabored ventilation, respiratory function stable and patient connected to nasal cannula oxygen ?Cardiovascular status: stable and blood pressure returned to baseline ?Postop Assessment: no apparent nausea or vomiting ?Anesthetic complications: no ? ? ?No notable events documented. ? ?Last Vitals:  ?Vitals:  ? 01/26/22 0026 01/26/22 0318  ?BP: 135/64 (!) 126/55  ?Pulse: 78 71  ?Resp: 16 14  ?Temp: 37.1 ?C 36.6 ?C  ?SpO2: 95% 97%  ?  ?Last Pain:  ?Vitals:  ? 01/26/22 0450  ?TempSrc:   ?PainSc: Asleep  ? ? ?  ?  ?  ?  ?  ?  ? ?[place] ? ? ? ? ?

## 2022-01-26 NOTE — PMR Pre-admission (Signed)
PMR Admission Coordinator Pre-Admission Assessment ? ?Patient: Mackenzie Madden is an 88 y.o., female ?MRN: [id number] ?DOB: 1927-10-13 ?Height: 5' (152.4 cm) ?Weight: 49.6 kg ? ?Insurance Information ?HMO:     PPO: yes     PCP:      IPA:      80/20:      OTHER:  ?PRIMARY: U[employer] Medicare      Policy#: [id number]      Subscriber: patient ?CM Name: n/a       Phone#: [phone]     Fax#: [phone] I received a call and fax from [facility] granting approval 01/31/22-02/06/22 with updates due 02/06/22 ?Pre-Cert#: [id number]      Employer:  ?Benefits:  Phone #: [email or web address]     Name:  ?Eff. Date: 10/02/21     Deduct: does not have one    ?Out of Pocket Max: $500      Life Max: NA ?CIR: $25 copay/admission      SNF: 100% coverage ?Outpatient: 100% coverage     Co-Pay:  ?Home Health: 100% coverage      Co-Pay:  ?DME: 80% coverage     Co-Pay: 20% co-insurance ?Providers: in-network ?SECONDARY:       Policy#:      Phone#:  ? ?Financial Counselor:       Phone#:  ? ?The ?Data Collection Information Summary? for patients in Inpatient Rehabilitation Facilities with attached ?Privacy Act [place] Records? was provided and verbally reviewed with: Patient ? ?Emergency Contact Information ?Contact Information   ? ? Name Relation Home Work Mobile  ? [place]  [phone]  ? Learta Codding Daughter   [phone]  ? ?  ? ? ?Current Medical History  ?Patient Admitting Diagnosis: left distal radius and ulna fx, debility ?History of Present Illness: Mackenzie Madden is a 88 year old right-handed female with history of hypertension, hypothyroidism, glaucoma and recently treated for UTI.  Per chart review patient lives with spouse.  1 level home with ramped entrance.  Walks with a rollator.  Independent with dressing and toileting.  She can do light ADLs.  Spouse assist with showers.  Presented 01/23/2022 after ground-level fall without loss of conscious.  Family had reported some bouts of vertigo  over the last 2 weeks.  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  X-rays of the left wrist show comminuted displaced and angulated distal radius fracture with intra-articular extension.  Large amount of air in the soft tissues.  Displaced angulated distal ulna fracture.  Admission chemistries unremarkable Sub sodium 132 glucose 130, urine culture greater 100,000 E. coli.  Patient underwent left distal ORIF/I&D as well as distal ulnar open reduction internal fixation 01/24/2021 per Dr. Orene Desanctis.  Nonweightbearing left upper extremity.  Subcutaneous heparin for DVT prophylaxis.  She completed a course of 5 days of Keflex for UTI.  Mildly elevated LFTs with nonspecific ultrasound resolved advised follow-up chemistries.  Therapy evaluations completed due to patient decreased functional mobility was admitted for a comprehensive rehab  ?  ? ?Patient's medical record from [facility] has been reviewed by the rehabilitation admission coordinator and physician. ? ?Past Medical History  ?Past Medical History:  ?Diagnosis Date  ? Glaucoma   ? Glaucoma   ? HOH (hard of hearing)   ? no hearing aids  ? Hypertension   ? Hypothyroidism   ? Thyroid disease   ? ? ?Has the patient had  major surgery during 100 days prior to admission? Yes ? ?Family History   ?family history includes CAD in her mother; Heart attack in her father; Kidney cancer in her mother. ? ?Current Medications ? ?Current Facility-Administered Medications:  ?  acetaminophen (TYLENOL) tablet 650 mg, 650 mg, Oral, Q6H PRN, 650 mg at 01/27/22 1626 **OR** acetaminophen (TYLENOL) suppository 650 mg, 650 mg, Rectal, Q6H PRN, Pokhrel, Laxman, MD ?  calcium carbonate (OS-CAL - dosed in mg of elemental calcium) tablet 1,250 mg, 1,250 mg, Oral, Q breakfast, Pokhrel, Laxman, MD, 1,250 mg at 01/29/22 5621 ?  docusate sodium (COLACE) capsule 100 mg,  100 mg, Oral, BID, Pokhrel, Laxman, MD, 100 mg at 01/29/22 3086 ?  dorzolamide-timolol (COSOPT) 22.3-6.8 MG/ML ophthalmic solution 1 drop, 1 drop, Both Eyes, BID, Pokhrel, Laxman, MD, 1 drop at 01/29/22 0827 ?  heparin injection 5,000 Units, 5,000 Units, Subcutaneous, Q8H, Thurnell Lose, MD, 5,000 Units at 01/29/22 1243 ?  hydrALAZINE (APRESOLINE) injection 10 mg, 10 mg, Intravenous, Q6H PRN, Pokhrel, Laxman, MD, 10 mg at 01/25/22 1836 ?  HYDROcodone-acetaminophen (NORCO/VICODIN) 5-325 MG per tablet 1 tablet, 1 tablet, Oral, Q6H PRN, Thurnell Lose, MD, 1 tablet at 01/28/22 2144 ?  latanoprost (XALATAN) 0.005 % ophthalmic solution 1 drop, 1 drop, Both Eyes, QHS, Pokhrel, Laxman, MD, 1 drop at 01/28/22 2136 ?  levothyroxine (SYNTHROID) tablet 88 mcg, 88 mcg, Oral, QAC breakfast, Pokhrel, Laxman, MD, 88 mcg at 01/29/22 0506 ?  meclizine (ANTIVERT) tablet 12.5 mg, 12.5 mg, Oral, TID, Thurnell Lose, MD, 12.5 mg at 01/29/22 5784 ?  multivitamin with minerals tablet 1 tablet, 1 tablet, Oral, Daily, Pokhrel, Laxman, MD, 1 tablet at 01/29/22 6962 ?  ondansetron (ZOFRAN) tablet 4 mg, 4 mg, Oral, Q6H PRN **OR** ondansetron (ZOFRAN) injection 4 mg, 4 mg, Intravenous, Q6H PRN, Pokhrel, Laxman, MD ?  polyethylene glycol (MIRALAX / GLYCOLAX) packet 17 g, 17 g, Oral, Daily PRN, Pokhrel, Laxman, MD ? ?Patients Current Diet:  ?Diet Order   ? ?       ?  Diet regular Room service appropriate? Yes; Fluid consistency: Thin  Diet effective now       ?  ? ?  ?  ? ?  ? ? ?Precautions / Restrictions ?Precautions ?Precautions: Fall ?Restrictions ?Weight Bearing Restrictions: No ?LUE Weight Bearing: Non weight bearing ?Other Position/Activity Restrictions: NWB hand and elbow per ortho  ? ?Has the patient had 2 or more falls or a fall with injury in the past year? Yes ? ?Prior Activity Level ?Limited Community (1-2x/wk): goes to church, medical appointments and walks ? ?Prior Functional Level ?Self Care: Did the patient need help  bathing, dressing, using the toilet or eating? Needed some help ? ?Indoor Mobility: Did the patient need assistance with walking from room to room (with or without device)? Independent ? ?Stairs: Did the patient need assistance with internal or external stairs (with or without device)? Pt avoided stairs ? ?Functional Cognition: Did the patient need help planning regular tasks such as shopping or remembering to take medications? Independent ? ?Patient Information ?Are you of Hispanic, Latino/a,or Spanish origin?: A. No, not of Hispanic, Latino/a, or Spanish origin ?What is your race?: A. White ?Do you need or want an interpreter to communicate with a doctor or health care staff?: 0. No ? ?Patient's Response To:  ?Health Literacy and Transportation ?Is the patient able to respond to health literacy and transportation needs?: Yes ?Health Literacy - How often do you need to have someone help you  when you read instructions, pamphlets, or other written material from your doctor or pharmacy?: Never ?In the past 12 months, has lack of transportation kept you from medical appointments or from getting medications?: No ?In the past 12 months, has lack of transportation kept you from meetings, work, or from getting things needed for daily living?: No ? ?Home Assistive Devices / Equipment ?Home Equipment: Rollator (4 wheels), Shower seat, BSC/3in1 ? ?Prior Device Use: Indicate devices/aids used by the patient prior to current illness, exacerbation or injury? Walker ? ?Current Functional Level ?Cognition ? Overall Cognitive Status: Within Functional Limits for tasks assessed ?Orientation Level: Oriented X4 ?General Comments: HOH with cognition at baseline for age ?   ?Extremity Assessment ?(includes Sensation/Coordination) ? Upper Extremity Assessment: LUE deficits/detail ?LUE Deficits / Details: LUE splinted from forearm to MPs.  Decreased AROM digit flexion/extension to approximately 50% within the confines of the splint.   Elbow AAROM WFLS with shoulder flexion 0-120 degrees AAROM as well.  Pt with pain in the thumb with flexion as well as forearm when attempting to support her for AAROM. ?LUE Sensation: WNL ?LUE Coordination:

## 2022-01-26 NOTE — Progress Notes (Signed)
?                                  PROGRESS NOTE                                             ?                                                                                                                     ?                                         ? ? Patient Demographics:  ? ? Mackenzie Madden, is a 88 y.o. female, DOB - 1928-02-02, TDV:[id number] ? ?Outpatient Primary MD for the patient is Lavone Orn, MD    LOS - 2  Admit date - 01/23/2022   ? ?Chief Complaint  ?Patient presents with  ? Fall  ?    ? ?Brief Narrative (HPI from H&P)   88 y.o. female with medical history significant of  hypertension, hypothyroidism was transferred from [facility] ED after experiencing a ground-level fall and was noted to have left wrist fracture.  Patient had been having vertigo or the last 2 weeks.  She was admitted for her left wrist fracture repair. ? ? Subjective:  ? ?Patient in bed, appears comfortable, denies any headache, no fever, no chest pain or pressure, no shortness of breath , no abdominal pain. No new focal weakness. ? ? ? Assessment  & Plan :  ? ? ?Vertigo related fall with left distal radius open fracture.  S/p surgical fixation by hand surgeon Dr. Greta Doom on 01/25/2022, left arm in splint, seems to have tolerated the procedure well, elderly patient who lives with her husband cannot care for herself right now.  PT OT and likely will require placement as well >> SNF/CIR. NWB L. hand. ? ? 2. Ongoing vertigo for several weeks.  Could be due to BPV, CT and MRI brain nonacute.  Add meclizine . ? ?3.  Essential hypertension.  On Coreg. ? ?4.  Hypothyroidism.  On Synthroid. ? ?5.  Cephalosporin sensitive E. Coli UTI present on admission.  Keflex x 5 days ? ?6.  Asymptomatic transaminitis.  Non specific [place], resolved, stable TB and INR. ? ?   ? ?Condition - Fair ? ?Family Communication  : Husband bedside on 01/25/2022, daughter bedside 01/26/22 ? ?Code Status :  Full ? ?Consults  :  Hand  surgery ? ?PUD Prophylaxis :  ? ? Procedures  :    ? ?Right upper quadrant ultrasound. ? ?CT head and MRI brain.  Nonacute. ? ?ORIF left wrist by hand surgery on 01/25/2019 - Dr Greta Doom ? ? LEFT distal radius  open reduction internal fixation, INTRA- articular, 3 fragments ?LEFT  ?2.    LEFT wrist brachioradialis tenotomy  ?3.    LEFT wrist radiographs four views with intraoperative interpretation ? ?   ? ?Disposition Plan  :   ? ?Status is: Inpatient ? ?DVT Prophylaxis  :  Heparin 01/25/22 ? ?heparin injection 5,000 Units Start: 01/25/22 1400 ?Place and maintain sequential compression device Start: 01/24/22 1554 ? ?Lab Results  ?Component Value Date  ? PLT 119 (L) 01/26/2022  ? ? ?Diet :  ?Diet Order   ? ?       ?  Diet regular Room service appropriate? Yes; Fluid consistency: Thin  Diet effective now       ?  ? ?  ?  ? ?  ?  ? ?Inpatient Medications ? ?Scheduled Meds: ? calcium carbonate  1,250 mg Oral Q breakfast  ? cephALEXin  500 mg Oral Q8H  ? docusate sodium  100 mg Oral BID  ? dorzolamide-timolol  1 drop Both Eyes BID  ? heparin injection (subcutaneous)  5,000 Units Subcutaneous Q8H  ? latanoprost  1 drop Both Eyes QHS  ? levothyroxine  88 mcg Oral QAC breakfast  ? meclizine  12.5 mg Oral TID  ? multivitamin with minerals  1 tablet Oral Daily  ? potassium chloride  20 mEq Oral Once  ? ?Continuous Infusions: ? sodium chloride 100 mL/hr at 01/26/22 0657  ? ?PRN Meds:.acetaminophen **OR** acetaminophen, hydrALAZINE, HYDROmorphone (DILAUDID) injection, ondansetron **OR** ondansetron (ZOFRAN) IV, polyethylene glycol ? ? ? Time Spent in minutes  30 ? ? ?Lala Lund M.D on 01/26/2022 at 11:49 AM ? ?To page go to [email or web address]  ? ?[facility] -  Office  [phone] ? ?See all Orders from today for further details ? ? ? Objective:  ? ?Vitals:  ? 01/26/22 0026 01/26/22 0217 01/26/22 2637 01/26/22 0825  ?BP: 135/64  (!) 126/55 138/63  ?Pulse: 78  71 72  ?Resp: '16  14 14  '$ ?Temp: 98.7 ?F (37.1 ?C)  97.8 ?F (36.6  ?C) 97.6 ?F (36.4 ?C)  ?TempSrc: Oral  Oral Oral  ?SpO2: 95%  97% 93%  ?Weight:  49 kg    ?Height:      ? ? ?Wt Readings from Last 3 Encounters:  ?01/26/22 49 kg  ?01/02/22 45.4 kg  ?08/05/19 44.9 kg  ? ? ? ?Intake/Output Summary (Last 24 hours) at 01/26/2022 1149 ?Last data filed at 01/26/2022 8588 ?Gross per 24 hour  ?Intake 360 ml  ?Output 400 ml  ?Net -40 ml  ? ? ? ?Physical Exam ? ?Awake Alert, No new F.N deficits, ++ hearing loss bilateral ?[place].AT,PERRAL ?Supple Neck, No JVD,   ?Symmetrical Chest wall movement, Good air movement bilaterally, CTAB ?RRR,No Gallops, Rubs or new Murmurs,  ?+ve B.Sounds, Abd Soft, No tenderness,   ?No Cyanosis, L arm in sling - mild edema in L hand ?  ? ? Data Review:  ? ? ?CBC ?Recent Labs  ?Lab 01/23/22 ?1906 01/25/22 ?0101 01/26/22 ?0100  ?WBC 9.0 9.8 8.7  ?HGB 13.2 11.9* 10.1*  ?HCT 40.2 36.2 30.1*  ?PLT 145* 126* 119*  ?MCV 95.9 98.4 95.3  ?MCH 31.5 32.3 32.0  ?MCHC 32.8 32.9 33.6  ?RDW 13.9 13.6 13.4  ?LYMPHSABS  --   --  2.1  ?MONOABS  --   --  1.4*  ?EOSABS  --   --  0.1  ?BASOSABS  --   --  0.0  ? ? ?Electrolytes ?Recent Labs  ?Lab 01/23/22 ?  1906 01/25/22 ?0101 01/26/22 ?0100  ?NA 132* 132* 132*  ?K 4.8 3.7 3.1*  ?CL 99 104 105  ?CO2 26 21* 23  ?GLUCOSE 130* 142* 106*  ?BUN '12 11 8  '$ ?CREATININE 0.82 0.83 0.66  ?CALCIUM 9.8 8.0* 7.7*  ?AST  --  277* 67*  ?ALT  --  93* 39  ?ALKPHOS  --  139* 108  ?BILITOT  --  1.2 0.7  ?ALBUMIN  --  2.6* 2.2*  ?MG  --   --  1.5*  ?INR  --  1.2  --   ?BNP  --   --  630.4*  ? ? ?------------------------------------------------------------------------------------------------------------------ ?No results for input(s): CHOL, HDL, LDLCALC, TRIG, CHOLHDL, LDLDIRECT in the last 72 hours. ? ?No results found for: HGBA1C ? ?No results for input(s): TSH, T4TOTAL, T3FREE, THYROIDAB in the last 72 hours. ? ?Invalid input(s): FREET3 ?------------------------------------------------------------------------------------------------------------------ ?ID  Labs ?Recent Labs  ?Lab 01/23/22 ?1906 01/25/22 ?0101 01/26/22 ?0100  ?WBC 9.0 9.8 8.7  ?PLT 145* 126* 119*  ?CREATININE 0.82 0.83 0.66  ? ?Cardiac Enzymes ?No results for input(s): CKMB, TROPONINI, MYOGLOBIN in the last 168 hours. ? ?Invalid input(s): CK ? ? ?  ?Radiology Reports ?DG Wrist 2 Views Left ? ?Result Date: 01/23/2022 ?CLINICAL DATA:  Fall while ambulating with walker at home. Left wrist swelling and deformity. EXAM: LEFT WRIST - 2 VIEW COMPARISON:  None. FINDINGS: Comminuted and displaced distal radius fracture primarily involving the metaphysis. There is apex radial angulation. Large amount of air in the soft tissues with skin irregularity and possible exposed bone suggesting open fracture. Fracture extends into the distal radioulnar joint. There is an oblique displaced fracture of the distal ulna with distal fracture fragments displaced about the volar aspect. The carpal bones remain aligned with the distal radius fracture fragment. 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 Comminuted, displaced and angulated distal radius fracture with intra-articular extension. Large amount of air in the soft tissues, skin irregularity with possible exposed bone suggesting open fracture. 2. Displaced angulated distal ulna fracture. Electronically Signed   By: Keith Rake M.D.   On: 01/23/2022 20:49  ? ?DG Wrist Complete Right ? ?Result Date: 01/23/2022 ?CLINICAL DATA:  Fall while ambulating with walker. Right wrist swelling. EXAM: RIGHT WRIST - COMPLETE 3+ VIEW COMPARISON:  None. FINDINGS: There is no evidence of fracture or dislocation. Cystic changes in the proximal lunate. There is chondrocalcinosis of the triangular fibrocartilage. Soft tissues are unremarkable. IMPRESSION: 1. No fracture or subluxation of the right wrist. 2. Chondrocalcinosis of the triangular fibrocartilage. Electronically Signed   By: Keith Rake M.D.   On: 01/23/2022 20:50  ? ?CT Head Wo Contrast ? ?Result  Date: 01/23/2022 ?CLINICAL DATA:  Head trauma, moderate-severe.  Fall. EXAM: CT HEAD WITHOUT CONTRAST TECHNIQUE: Contiguous axial images were obtained from the base of the skull through the vertex without intraveno

## 2022-01-26 NOTE — Progress Notes (Signed)
Pt having difficulty swallowing PO medications, especially larger pills ex. Potassium. Alternate route requested and Candiss Norse, MD aware of situation.  ?

## 2022-01-26 NOTE — Progress Notes (Signed)
Orthopedic Tech Progress Note ?Patient Details:  ?Mackenzie Madden ?1928-04-13 ?[id number] ? ? ?Per verbal order from Dr. Greta Doom, a [name] was picked up from the OR and applied to pt's LUE ? ?Ortho Devices ?Type of Ortho Device: Other (comment) (CARTER PILLOW) ?Ortho Device/Splint Location: LUE ?Ortho Device/Splint Interventions: Ordered, Application, Adjustment ?  ?Post Interventions ?Patient Tolerated: Fair ?Instructions Provided: Adjustment of device, Care of device ? ?[name] ?01/26/2022, 5:26 PM ? ?

## 2022-01-26 NOTE — Progress Notes (Signed)
Occupational Therapy Treatment ?Patient Details ?Name: Mackenzie Madden ?MRN: [id number] ?DOB: [birth year] ?Today's Date: 01/26/2022 ? ? ?History of present illness Pt is 88 yo female admitted on 01/23/22 after fall with L distal radius and ulna fx.  Pt is s/p ORIF on 02/23/22 with splint.  Pt also reports some vertigo for 2 weeks.  Pt with hx of HTN, hypothyroidism, hip fx a couple years ago. ?  ?OT comments ? Pt needing overall mod assist for transfers and functional mobility to the bedside recliner.  Max assist for toileting tasks secondary to bladder stress incontinence with sit to stand and standing.  She is tolerating AROM for the elbow and shoulder but demonstrates increased swelling and decreased AROM for digit flexion and extension.  Encouraged having the hand positioned above the elbow to help decrease this.    ? ?Recommendations for follow up therapy are one component of a multi-disciplinary discharge planning process, led by the attending physician.  Recommendations may be updated based on patient status, additional functional criteria and insurance authorization. ?   ?Follow Up Recommendations ? Acute inpatient rehab (3hours/day)  ?  ?Assistance Recommended at Discharge Frequent or constant Supervision/Assistance  ?Patient can return home with the following ? A little help with walking and/or transfers;A little help with bathing/dressing/bathroom;Assistance with cooking/housework;Direct supervision/assist for financial management;Help with stairs or ramp for entrance;Assist for transportation ?  ?   ?   ?Precautions / Restrictions Precautions ?Precautions: Fall ?Required Braces or Orthoses: Splint/Cast ?Splint/Cast: L wrist, sling for comfort ?Restrictions ?Weight Bearing Restrictions: Yes ?LUE Weight Bearing: Non weight bearing ?Other Position/Activity Restrictions: NWB hand and elbow per ortho  ? ? ?  ? ?Mobility Bed Mobility ?Overal bed mobility: Needs Assistance ?  ?  ?  ?Supine to sit: Mod assist ?   ?  ?  ?  ? ?Transfers ?Overall transfer level: Needs assistance ?Equipment used: 1 person hand held assist ?Transfers: Sit to/from Stand, Bed to chair/wheelchair/BSC ?Sit to Stand: Mod assist ?  ?  ?Step pivot transfers: Mod assist ?  ?  ?General transfer comment: Decreased step length, will likely do much better with use of a cane. ?  ?  ?Balance Overall balance assessment: Needs assistance ?Sitting-balance support: No upper extremity supported ?Sitting balance-Leahy Scale: Fair ?Sitting balance - Comments: still with posterior lean and LOB at times if trying to lift her LEs for dressing tasks. ?  ?Standing balance support: Single extremity supported ?Standing balance-Leahy Scale: Poor ?  ?  ?  ?  ?  ?  ?  ?  ?  ?  ?  ?  ?   ? ?ADL either performed or assessed with clinical judgement  ? ?ADL Overall ADL's : Needs assistance/impaired ?  ?  ?  ?  ?  ?  ?  ?  ?  ?  ?Lower Body Dressing: Maximal assistance;Sit to/from stand ?  ?Toilet Transfer: Moderate assistance;Stand-pivot ?  ?Toileting- Clothing Manipulation and Hygiene: Sit to/from stand;Maximal assistance ?  ?  ?  ?Functional mobility during ADLs: Moderate assistance (no device with hand held on the foot board of the bed) ?General ADL Comments: Pt transferred from bed to bedside recliner and then worked on completion of AROM exercises for the left elbow, shoulder, and digits.  She demonstrated increased edema in the digits with decreased AROM/AAROM in digit flexion and extenson secondary to pain.  Educated family on completion of digit AROM exercises for 10-15 reps every 2-3 hours.  Also provided education on positioning the  RUE with the hand above the elbow to help decrease edema. ?  ? ? ?   ?   ?   ? ?Cognition Arousal/Alertness: Awake/alert ?Behavior During Therapy: [facility] for tasks assessed/performed ?Overall Cognitive Status: Within Functional Limits for tasks assessed ?  ?  ?  ?  ?  ?  ?  ?  ?  ?  ?  ?  ?  ?  ?  ?  ?General Comments: HOH with cognition at  baseline for age ?  ?  ?   ?   ?   ?   ? ? ?Pertinent Vitals/ Pain       Pain Assessment ?Pain Assessment: Faces ?Faces Pain Scale: Hurts a little bit ?Pain Location: left lower arm ?Pain Descriptors / Indicators: Discomfort ?Pain Intervention(s): Limited activity within patient's tolerance, Monitored during session ? ?   ?   ? ?Frequency ? Min 2X/week  ? ? ? ? ?  ?Progress Toward Goals ? ?OT Goals(current goals can now be found in the care plan section) ? Progress towards OT goals: Progressing toward goals ? ?Acute Rehab OT Goals ?Patient Stated Goal: Pt did not state, but hopefull for inpatient rehab ?OT Goal Formulation: With patient ?Time For Goal Achievement: 01/25/22 ?Potential to Achieve Goals: Good  ?Plan Discharge plan needs to be updated   ? ?   ?AM-PAC OT "6 Clicks" Daily Activity     ?Outcome Measure ? ? Help from another person eating meals?: A Little ?Help from another person taking care of personal grooming?: A Little ?Help from another person toileting, which includes using toliet, bedpan, or urinal?: A Lot ?Help from another person bathing (including washing, rinsing, drying)?: A Lot ?Help from another person to put on and taking off regular upper body clothing?: A Lot ?Help from another person to put on and taking off regular lower body clothing?: A Lot ?6 Click Score: 14 ? ?  ?End of Session Equipment Utilized During Treatment: Other (comment) (sling) ? ?OT Visit Diagnosis: Unsteadiness on feet (R26.81);Repeated falls (R29.6);Muscle weakness (generalized) (M62.81) ?  ?Activity Tolerance Patient tolerated treatment well ?  ?Patient Left in bed;with call bell/phone within reach;with bed alarm set;with family/visitor present ?  ?Nurse Communication Mobility status ?  ? ?   ? ?Time: 7681-1572 ?OT Time Calculation (min): 41 min ? ?Charges: OT General Charges ?$OT Visit: 1 Visit ?OT Treatments ?$Self Care/Home Management : 23-37 mins ?$Therapeutic Exercise: 8-22 mins ? ?Matasha Smigelski OTR/L ?01/26/2022,  1:54 PM ?

## 2022-01-26 NOTE — Care Management Important Message (Signed)
Important Message ? ?Patient Details  ?Name: Mackenzie Madden ?MRN: [id number] ?Date of Birth: [birth year] ? ? ?Medicare Important Message Given:  Yes ? ? ? ? ?[name] ?01/26/2022, 3:41 PM ?

## 2022-01-26 NOTE — TOC Initial Note (Signed)
Transition of Care (TOC) - Initial/Assessment Note  ? ? ?Patient Details  ?Name: Mackenzie Madden ?MRN: [id number] ?Date of Birth: 16-Apr-1928 ? ?Transition of Care (TOC) CM/SW Contact:    ?Benard Halsted, LCSW ?Phone Number: ?01/26/2022, 5:19 PM ? ?Clinical Narrative:                 ?CSW received consult for possible SNF placement at time of discharge in the event insurance does not approved CIR. CSW spoke with patient, spouse, and daughter at bedside. Patient was not able to hear CSW well. Daughter expressed understanding of PT recommendation and may be agreeable to SNF placement at time of discharge if insurance does not approve CIR. No further questions reported at this time.  ? ?Skilled Nursing Rehab Facilities-   [email or web address]   Ratings out of 5 possible   ?Name Address  Phone # Quality Care Staffing Health Inspection Overall  ?[facility] [address], [place] '4 5 2 3  '$ ?[place]  [address], [place] [phone] '3 2 5 5  '$ ?[facility] [place], [place] '3 1 1 1  '$ ?[place], [place] '3 2 4 4  '$ ?[facility] [address] [place] '1 1 2 1  '$ ?[place] [phone] '2 1 4 3  '$ ?[place], [place] [phone] '5 2 3 4  '$ ?[facility] [address] [place] '5 2 2 3  '$ ?[facility] [place] [phone] '5 1 2 2  '$ ?[facility] [address], [place] [phone] '4 1 2 1  '$ ?[facility] [address], [place] [phone] '4 1 2 1  '$ ?[facility] [place] '4 1 1 1  '$ ?[facility] [address], [place] '3 2 4 4  '$ ?        ?[place], [place]      ?[facility] '4 2 3 3  '$ ?[facility] [place], [place] '4 1 5 4  '$ ?[place], [place] [address] [place], [place] [phone] '2 1 1 1  '$ ?[facility] [address], [place] [phone] '2 1 3 2  '$ ?        ?[address] (no [facility]) [address], [place] [phone] '4 5 5 5  '$ ?[facility] (No [employer]) [address], [place] '3 1 4 3  '$ ?[place] (No UHC) [address], [place] [phone] '5 5 5 5  '$ ?[facility] [address], [place] [phone] '3 2 4 4  '$ ?[place] [address], [place] '1 1 2 1  '$ ?[place] [address], [place] [phone] '2 1 1 1  '$ ?[facility] [place] '5 2 4 5  '$ ?[facility] [address], [place] '3 1 1 1  '$ ?[facility] [place] '2 1 2 1  '$ ?        ?[facility] [address] [place] '1 1 1 1  '$ ?[place] [address], [place]  [phone] '2 4 2 2  '$ ?[facility] [place] [address], [place] [phone] '5 2 3 4  '$ ?[place] [address], [place] '2 1 1 1  '$ ?[place] (No Humana) [address]  [address] [place] '2 1 3 2  '$ ?[facility] [address], [place] [phone] '3 1 1 1  '$ ?        ?[facility] [place], [place] '5 4 5 5  '$ ?[facility])  [address], [place] '2 2 3 3  '$ ?[facility] [place], [place] '3 2 4 4  '$ ?[facility] [address], [place] '4 3 4 4  '$ ?[address] [place] '3 3 1 1  '$ ?[facility]) [address] [place] [phone] '2 2 4 4  '$ ? ? ? ?Expected Discharge Plan: [place] ?Barriers to Discharge: Continued Medical Work up, Ship broker ? ? ?Patient  Goals and CMS Choice ?Patient states their goals for this hospitalization and ongoing recovery are:: Rehab ?CMS Medicare.gov Compare Post Acute Care list provided to:: Patient ?Choice offered to / list presented to : Patient, Adult Children, Spouse ? ?Expected Discharge Plan and Services ?Expected Discharge Plan: [place] ?In-house Referral: Clinical Social Work ?  ?Post Acute Care Choice: IP Rehab ?Living arrangements for the past 2 months: [place] ?                ?  ?  ?  ?  ?  ?  ?  ?  ?  ?  ? ?Prior Living Arrangements/Services ?Living arrangements for the past 2 months: [place] ?Lives with:: Spouse ?Patient language and need for interpreter reviewed:: Yes ?Do you feel safe going back to the place where you live?: Yes      ?Need for Family Participation in Patient Care: Yes (Comment) ?Care giver support system in place?: Yes (comment) ?  ?Criminal Activity/Legal Involvement Pertinent to Current Situation/Hospitalization: No - Comment as needed ? ?Activities of Daily Living ?  ?  ? ?Permission Sought/Granted ?Permission sought to share information with : Facility Sport and exercise psychologist, Family Supports ?Permission granted to share information with : Yes, Verbal Permission Granted ? Share Information with NAME: [name] ? Permission granted to share info w AGENCY: SNFs ? Permission granted to share info w Relationship: Daughter ? Permission granted to share info w Contact Information: [phone] ? ?Emotional Assessment ?Appearance:: Appears stated age ?Attitude/Demeanor/Rapport: Gracious (Could not hear well) ?Affect (typically observed): Appropriate ?Orientation: : Oriented to Self, Oriented to Place, Oriented to  Time, Oriented to Situation ?Alcohol / Substance Use: Not Applicable ?Psych Involvement: No (comment) ? ?Admission diagnosis:  Dizziness [R42] ?Vertigo [R42] ?Open fracture of left wrist, initial encounter [S62.102B] ?Patient Active Problem List  ? Diagnosis Date Noted  ?  Open wrist fracture, left, initial encounter 01/24/2022  ? Vertigo 01/23/2022  ? Hip fracture ([place]) 08/04/2019  ? Closed right femoral fracture ([place]) 08/04/2019  ? Closed subcapital fracture of right femur ([place])   ? Pubic ramus fracture, left, closed, initial encounter ([place])   ? Leukocytosis   ? Pubic ramus fracture ([place]) 08/11/2018  ? Sacral fracture, closed ([place])   ? Fall   ? Acute lower UTI   ? Laceration of bladder   ? Abnormal transaminases 06/06/2016  ? Atypical chest pain 06/04/2016  ? Chest pain 06/04/2016  ? Faintness   ? Chronic diastolic heart failure ([place])   ? Intraocular pressure increase   ? Syncope 02/21/2015  ? HTN (hypertension) 02/21/2015  ? Hypothyroid 02/21/2015  ? ?PCP:  Lavone Orn, MD ?Pharmacy:   ?[id number]  [place] ?[place] ?[place] [address] ?Phone: [phone] Fax: [phone] ? ? ? ? ?Social Determinants of Health (SDOH) Interventions ?  ? ?Readmission Risk Interventions ?   ? View : No data to display.  ?  ?  ?  ? ? ? ?

## 2022-01-26 NOTE — Progress Notes (Signed)
Physical Therapy Treatment ?Patient Details ?Name: Mackenzie Madden ?MRN: [id number] ?DOB: [birth year] ?Today's Date: 01/26/2022 ? ? ?History of Present Illness Pt is 88 yo female admitted on 01/23/22 after fall with L distal radius and ulna fx.  Pt is s/p ORIF on 02/23/22 with splint.  Pt also reports some vertigo for 2 weeks.  Pt with hx of HTN, hypothyroidism, hip fx a couple years ago. ? ?  ?PT Comments  ? ? Session focused on gait training with use of hemiwalker. Patient required modA with use of hemiwalker for balance, sequencing, and management. Patient's confidence improved with more time with use of hemiwalker. Anticipate this will be the most appropriate AD to utilize at this time, but may look into quad cane if mobility improves to less assistance with hemiwalker. Continue to recommend acute inpatient rehab (AIR) for post-acute therapy needs.  ?   ?Recommendations for follow up therapy are one component of a multi-disciplinary discharge planning process, led by the attending physician.  Recommendations may be updated based on patient status, additional functional criteria and insurance authorization. ? ?Follow Up Recommendations ? Acute inpatient rehab (3hours/day) ?  ?  ?Assistance Recommended at Discharge Frequent or constant Supervision/Assistance  ?Patient can return home with the following A lot of help with walking and/or transfers;A lot of help with bathing/dressing/bathroom;Assistance with cooking/housework ?  ?Equipment Recommendations ? Wheelchair (measurements PT);Wheelchair cushion (measurements PT);Other (comment);BSC/3in1 (hemiwalker)  ?  ?Recommendations for Other Services   ? ? ?  ?Precautions / Restrictions Precautions ?Precautions: Fall ?Required Braces or Orthoses: Splint/Cast ?Splint/Cast: L wrist, sling for comfort ?Restrictions ?Weight Bearing Restrictions: Yes ?LUE Weight Bearing: Non weight bearing ?Other Position/Activity Restrictions: NWB hand and elbow per ortho  ?  ? ?Mobility ?  Bed Mobility ?  ?  ?  ?  ?  ?  ?  ?General bed mobility comments: up in recliner on arrival ?  ? ?Transfers ?Overall transfer level: Needs assistance ?Equipment used: Hemi-Mahari Strahm ?Transfers: Sit to/from Stand, Bed to chair/wheelchair/BSC ?Sit to Stand: Min assist ?  ?Step pivot transfers: Mod assist ?  ?  ?  ?General transfer comment: minA to rise from recliner x 3 during session. ModA for balance and sequencing to step towards BSC and back to recliner ?  ? ?Ambulation/Gait ?Ambulation/Gait assistance: Mod assist ?Gait Distance (Feet): 10 Feet ?Assistive device: Hemi-Arlando Leisinger ?Gait Pattern/deviations: Step-to pattern, Decreased stride length, Trunk flexed ?Gait velocity: decreased ?Gait velocity interpretation: <1.31 ft/sec, indicative of household ambulator ?  ?General Gait Details: hesitant initially with use of hemiwalker. ModA for balance, sequencing, and hemiwalker management. Increased difficulty with turns but straight path improved on return to chair. Patient feeling more confident at end of short ambulation once more comfortable with hemiwalker ? ? ?Stairs ?  ?  ?  ?  ?  ? ? ?Wheelchair Mobility ?  ? ?Modified Rankin (Stroke Patients Only) ?  ? ? ?  ?Balance Overall balance assessment: Needs assistance ?Sitting-balance support: No upper extremity supported ?Sitting balance-Leahy Scale: Fair ?  ?  ?Standing balance support: Single extremity supported ?Standing balance-Leahy Scale: Poor ?Standing balance comment: modA for balance with inital posterior lean but able to correct with assist and cues ?  ?  ?  ?  ?  ?  ?  ?  ?  ?  ?  ?  ? ?  ?Cognition Arousal/Alertness: Awake/alert ?Behavior During Therapy: [facility] for tasks assessed/performed ?Overall Cognitive Status: Within Functional Limits for tasks assessed ?  ?  ?  ?  ?  ?  ?  ?  ?  ?  ?  ?  ?  ?  ?  ?  ?  General Comments: HOH with cognition at baseline for age ?  ?  ? ?  ?Exercises   ? ?  ?General Comments   ?  ?  ? ?Pertinent Vitals/Pain Pain Assessment ?Pain  Assessment: Faces ?Faces Pain Scale: Hurts little more ?Pain Location: left lower arm ?Pain Descriptors / Indicators: Discomfort ?Pain Intervention(s): Monitored during session  ? ? ?Home Living   ?  ?  ?  ?  ?  ?  ?  ?  ?  ?   ?  ?Prior Function    ?  ?  ?   ? ?PT Goals (current goals can now be found in the care plan section) Acute Rehab PT Goals ?Patient Stated Goal: return home ?PT Goal Formulation: With patient ?Time For Goal Achievement: 02/08/22 ?Potential to Achieve Goals: Good ?Progress towards PT goals: Progressing toward goals ? ?  ?Frequency ? ? ? Min 5X/week ? ? ? ?  ?PT Plan Current plan remains appropriate  ? ? ?Co-evaluation   ?  ?  ?  ?  ? ?  ?AM-PAC PT "6 Clicks" Mobility   ?Outcome Measure ? Help needed turning from your back to your side while in a flat bed without using bedrails?: A Lot ?Help needed moving from lying on your back to sitting on the side of a flat bed without using bedrails?: A Lot ?Help needed moving to and from a bed to a chair (including a wheelchair)?: A Lot ?Help needed standing up from a chair using your arms (e.g., wheelchair or bedside chair)?: A Lot ?Help needed to walk in hospital room?: A Lot ?Help needed climbing 3-5 steps with a railing? : Total ?6 Click Score: 11 ? ?  ?End of Session Equipment Utilized During Treatment: Gait belt;Other (comment) (L UE sling) ?Activity Tolerance: Patient tolerated treatment well ?Patient left: in chair;with call bell/phone within reach;with family/visitor present ?Nurse Communication: Mobility status ?PT Visit Diagnosis: Other abnormalities of gait and mobility (R26.89) ?  ? ? ?Time: 7408-1448 ?PT Time Calculation (min) (ACUTE ONLY): 48 min ? ?Charges:  $Gait Training: 38-52 mins          ?          ? ?Dyshaun Bonzo A. Gilford Rile, PT, DPT ?Acute Rehabilitation Services ?Pager [phone] ?Office [phone] ? ? ? ?Tannis Burstein A Zsofia Prout ?01/26/2022, 3:25 PM ? ?

## 2022-01-26 NOTE — Progress Notes (Signed)
Inpatient Rehab Admissions Coordinator:  ?Saw pt and family at bedside. Informed pt that insurance authorization has started. Reviewed benefits with pt and family. Will continue to follow. ? ?Gayland Curry, MS, CCC-SLP ?Admissions Coordinator ?[phone] ? ?

## 2022-01-27 DIAGNOSIS — S62102B Fracture of unspecified carpal bone, left wrist, initial encounter for open fracture: Secondary | ICD-10-CM | POA: Diagnosis not present

## 2022-01-27 LAB — CBC WITH DIFFERENTIAL/PLATELET
Abs Immature Granulocytes: 0.05 10*3/uL (ref 0.00–0.07)
Basophils Absolute: 0 10*3/uL (ref 0.0–0.1)
Basophils Relative: 0 %
Eosinophils Absolute: 0.3 10*3/uL (ref 0.0–0.5)
Eosinophils Relative: 3 %
HCT: 30.7 % — ABNORMAL LOW (ref 36.0–46.0)
Hemoglobin: 10.5 g/dL — ABNORMAL LOW (ref 12.0–15.0)
Immature Granulocytes: 1 %
Lymphocytes Relative: 25 %
Lymphs Abs: 2.5 10*3/uL (ref 0.7–4.0)
MCH: 32.6 pg (ref 26.0–34.0)
MCHC: 34.2 g/dL (ref 30.0–36.0)
MCV: 95.3 fL (ref 80.0–100.0)
Monocytes Absolute: 1.3 10*3/uL — ABNORMAL HIGH (ref 0.1–1.0)
Monocytes Relative: 13 %
Neutro Abs: 6 10*3/uL (ref 1.7–7.7)
Neutrophils Relative %: 58 %
Platelets: 126 10*3/uL — ABNORMAL LOW (ref 150–400)
RBC: 3.22 MIL/uL — ABNORMAL LOW (ref 3.87–5.11)
RDW: 13.6 % (ref 11.5–15.5)
WBC: 10.1 10*3/uL (ref 4.0–10.5)
nRBC: 0 % (ref 0.0–0.2)

## 2022-01-27 LAB — COMPREHENSIVE METABOLIC PANEL
ALT: 49 U/L — ABNORMAL HIGH (ref 0–44)
AST: 68 U/L — ABNORMAL HIGH (ref 15–41)
Albumin: 2.3 g/dL — ABNORMAL LOW (ref 3.5–5.0)
Alkaline Phosphatase: 156 U/L — ABNORMAL HIGH (ref 38–126)
Anion gap: 3 — ABNORMAL LOW (ref 5–15)
BUN: 8 mg/dL (ref 8–23)
CO2: 21 mmol/L — ABNORMAL LOW (ref 22–32)
Calcium: 8.3 mg/dL — ABNORMAL LOW (ref 8.9–10.3)
Chloride: 106 mmol/L (ref 98–111)
Creatinine, Ser: 0.71 mg/dL (ref 0.44–1.00)
GFR, Estimated: >60 mL/min (ref >60)
Glucose, Bld: 102 mg/dL — ABNORMAL HIGH (ref 70–99)
Potassium: 5.1 mmol/L (ref 3.5–5.1)
Sodium: 130 mmol/L — ABNORMAL LOW (ref 135–145)
Total Bilirubin: 0.6 mg/dL (ref 0.3–1.2)
Total Protein: 5.1 g/dL — ABNORMAL LOW (ref 6.5–8.1)

## 2022-01-27 LAB — BRAIN NATRIURETIC PEPTIDE: B Natriuretic Peptide: 380.9 pg/mL — ABNORMAL HIGH (ref 0.0–100.0)

## 2022-01-27 LAB — MAGNESIUM: Magnesium: 2.1 mg/dL (ref 1.7–2.4)

## 2022-01-27 MED ORDER — HYDROCODONE-ACETAMINOPHEN 5-325 MG PO TABS
1.0000 | ORAL_TABLET | Freq: Four times a day (QID) | ORAL | Status: DC | PRN
  Administered 2022-01-28 – 2022-01-30 (×4): 1 via ORAL
  Filled 2022-01-27 (×4): qty 1

## 2022-01-27 MED ORDER — FUROSEMIDE 10 MG/ML IJ SOLN
20.0000 mg | Freq: Once | INTRAMUSCULAR | Status: AC
  Administered 2022-01-27: 20 mg via INTRAVENOUS
  Filled 2022-01-27: qty 2

## 2022-01-27 NOTE — Progress Notes (Signed)
Inpatient Rehab Admissions Coordinator:  ? ?I do not yet have insurance authorization or a bed for this Pt. On CIR this morning. Pt.'s case is likely to require an appeal and Pt. Family state they do wish to appeal in the event of an insurance denial. I will continue to follow for potential insurance auth pending insurance auth. ? ?Clemens Catholic, MS, CCC-SLP ?Rehab Admissions Coordinator  ?[phone] (celll) ?[phone] (office) ? ?

## 2022-01-27 NOTE — Plan of Care (Signed)

## 2022-01-27 NOTE — Progress Notes (Signed)
?                                  PROGRESS NOTE                                             ?                                                                                                                     ?                                         ? ? Patient Demographics:  ? ? Mackenzie Madden, is a 88 y.o. female, DOB - Oct 07, 1927, ZRA:[id number] ? ?Outpatient Primary MD for the patient is Lavone Orn, MD    LOS - 3  Admit date - 01/23/2022   ? ?Chief Complaint  ?Patient presents with  ? Fall  ?    ? ?Brief Narrative (HPI from H&P)   88 y.o. female with medical history significant of  hypertension, hypothyroidism was transferred from [facility] ED after experiencing a ground-level fall and was noted to have left wrist fracture.  Patient had been having vertigo or the last 2 weeks.  She was admitted for her left wrist fracture repair. ? ? Subjective:  ? ?Patient in bed denies any headache chest or abdominal pain. ? ? Assessment  & Plan :  ? ? ?Vertigo related fall with left distal radius open fracture.  S/p surgical fixation by hand surgeon Dr. Greta Doom on 01/25/2022, left arm in splint, seems to have tolerated the procedure well, elderly patient who lives with her husband cannot care for herself right now.  PT OT and likely will require placement as well >> SNF/CIR. NWB L. hand. ? ? 2. Ongoing vertigo for several weeks.  Could be due to BPV, CT and MRI brain nonacute.  Add meclizine . ? ?3.  Essential hypertension.  On Coreg. ? ?4.  Hypothyroidism.  On Synthroid. ? ?5.  Cephalosporin sensitive E. Coli UTI present on admission.  Keflex x 5 days.  Has finished her course. ? ?6.  Asymptomatic transaminitis.  Non specific [place], resolved, stable TB and INR. ? ?7.  Hyponatremia due to SIADH.  Stop fluids gentle Lasix and monitor. ? ?   ? ?Condition - Fair ? ?Family Communication  : Husband bedside on 01/25/2022, daughter bedside 01/26/22 ? ?Code Status :  Full ? ?Consults  :  Hand  surgery ? ?PUD Prophylaxis :  ? ? Procedures  :    ? ?Right upper quadrant ultrasound. ? ?CT head and MRI brain.  Nonacute. ? ?ORIF left wrist by hand surgery on 01/25/2019 - Dr Greta Doom ? ?LEFT distal radius  open reduction internal fixation, INTRA- articular, 3 fragments ?LEFT  ?3.  LEFT wrist brachioradialis tenotomy  ?4.  LEFT wrist radiographs four views with intraoperative interpretation ? ?   ? ?Disposition Plan  :   ? ?Status is: Inpatient ? ?DVT Prophylaxis  :  Heparin 01/25/22 ? ?heparin injection 5,000 Units Start: 01/25/22 1400 ?Place and maintain sequential compression device Start: 01/24/22 1554 ? ?Lab Results  ?Component Value Date  ? PLT 126 (L) 01/27/2022  ? ? ?Diet :  ?Diet Order   ? ?       ?  Diet regular Room service appropriate? Yes; Fluid consistency: Thin  Diet effective now       ?  ? ?  ?  ? ?  ?  ? ?Inpatient Medications ? ?Scheduled Meds: ? calcium carbonate  1,250 mg Oral Q breakfast  ? docusate sodium  100 mg Oral BID  ? dorzolamide-timolol  1 drop Both Eyes BID  ? heparin injection (subcutaneous)  5,000 Units Subcutaneous Q8H  ? latanoprost  1 drop Both Eyes QHS  ? levothyroxine  88 mcg Oral QAC breakfast  ? meclizine  12.5 mg Oral TID  ? multivitamin with minerals  1 tablet Oral Daily  ? ?Continuous Infusions: ? ? ?PRN Meds:.acetaminophen **OR** acetaminophen, hydrALAZINE, HYDROcodone-acetaminophen, ondansetron **OR** ondansetron (ZOFRAN) IV, polyethylene glycol ? ? ? Time Spent in minutes  30 ? ? ?Lala Lund M.D on 01/27/2022 at 10:00 AM ? ?To page go to [email or web address]  ? ?[facility] -  Office  [phone] ? ?See all Orders from today for further details ? ? ? Objective:  ? ?Vitals:  ? 01/27/22 0358 01/27/22 0400 01/27/22 0500 01/27/22 0805  ?BP: 127/65   136/90  ?Pulse: 68     ?Resp: 16   19  ?Temp: 98.6 ?F (37 ?C)   97.8 ?F (36.6 ?C)  ?TempSrc: Oral   Oral  ?SpO2: 96% 95%    ?Weight:   52.4 kg   ?Height:      ? ? ?Wt Readings from Last 3 Encounters:  ?01/27/22 52.4 kg   ?01/02/22 45.4 kg  ?08/05/19 44.9 kg  ? ? ? ?Intake/Output Summary (Last 24 hours) at 01/27/2022 1000 ?Last data filed at 01/26/2022 1847 ?Gross per 24 hour  ?Intake 240 ml  ?Output 300 ml  ?Net -60 ml  ? ? ? ?Physical Exam ? ?Awake Alert, No new F.N deficits, ++ hearing loss bilateral ?[place].AT,PERRAL ?Supple Neck, No JVD,   ?Symmetrical Chest wall movement, Good air movement bilaterally, CTAB ?RRR,No Gallops, Rubs or new Murmurs,  ?+ve B.Sounds, Abd Soft, No tenderness,   ?Left arm under bandage and in sling elevated, edema almost completely resolved  ? ? Data Review:  ? ? ?CBC ?Recent Labs  ?Lab 01/23/22 ?1906 01/25/22 ?0101 01/26/22 ?0100 01/27/22 ?0331  ?WBC 9.0 9.8 8.7 10.1  ?HGB 13.2 11.9* 10.1* 10.5*  ?HCT 40.2 36.2 30.1* 30.7*  ?PLT 145* 126* 119* 126*  ?MCV 95.9 98.4 95.3 95.3  ?MCH 31.5 32.3 32.0 32.6  ?MCHC 32.8 32.9 33.6 34.2  ?RDW 13.9 13.6 13.4 13.6  ?LYMPHSABS  --   --  2.1 2.5  ?MONOABS  --   --  1.4* 1.3*  ?EOSABS  --   --  0.1 0.3  ?BASOSABS  --   --  0.0 0.0  ? ? ?Electrolytes ?Recent Labs  ?Lab 01/23/22 ?1906 01/25/22 ?0101 01/26/22 ?0100 01/27/22 ?0331  ?NA 132* 132* 132* 130*  ?K 4.8 3.7 3.1* 5.1  ?CL 99  104 105 106  ?CO2 26 21* 23 21*  ?GLUCOSE 130* 142* 106* 102*  ?BUN '12 11 8 8  '$ ?CREATININE 0.82 0.83 0.66 0.71  ?CALCIUM 9.8 8.0* 7.7* 8.3*  ?AST  --  277* 67* 68*  ?ALT  --  93* 39 49*  ?ALKPHOS  --  139* 108 156*  ?BILITOT  --  1.2 0.7 0.6  ?ALBUMIN  --  2.6* 2.2* 2.3*  ?MG  --   --  1.5* 2.1  ?INR  --  1.2  --   --   ?BNP  --   --  630.4* 380.9*  ? ? ?------------------------------------------------------------------------------------------------------------------ ?No results for input(s): CHOL, HDL, LDLCALC, TRIG, CHOLHDL, LDLDIRECT in the last 72 hours. ? ?No results found for: HGBA1C ? ?No results for input(s): TSH, T4TOTAL, T3FREE, THYROIDAB in the last 72 hours. ? ?Invalid input(s):  FREET3 ?------------------------------------------------------------------------------------------------------------------ ?ID Labs ?Recent Labs  ?Lab 01/23/22 ?1906 01/25/22 ?0101 01/26/22 ?0100 01/27/22 ?0331  ?WBC 9.0 9.8 8.7 10.1  ?PLT 145* 126* 119* 126*  ?CREATININE 0.82 0.83 0.66 0.71  ? ?Cardiac Enzymes ?No results for input(s): CKMB, TROPONINI, MYOGLOBIN in the last 168 hours. ? ?Invalid input(s): CK ? ? ?  ?Radiology Reports ?DG Wrist 2 Views Left ? ?Result Date: 01/23/2022 ?CLINICAL DATA:  Fall while ambulating with walker at home. Left wrist swelling and deformity. EXAM: LEFT WRIST - 2 VIEW COMPARISON:  None. FINDINGS: Comminuted and displaced distal radius fracture primarily involving the metaphysis. There is apex radial angulation. Large amount of air in the soft tissues with skin irregularity and possible exposed bone suggesting open fracture. Fracture extends into the distal radioulnar joint. There is an oblique displaced fracture of the distal ulna with distal fracture fragments displaced about the volar aspect. The carpal bones remain aligned with the distal radius fracture fragment. 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 Comminuted, displaced and angulated distal radius fracture with intra-articular extension. Large amount of air in the soft tissues, skin irregularity with possible exposed bone suggesting open fracture. 2. Displaced angulated distal ulna fracture. Electronically Signed   By: Keith Rake M.D.   On: 01/23/2022 20:49  ? ?DG Wrist Complete Right ? ?Result Date: 01/23/2022 ?CLINICAL DATA:  Fall while ambulating with walker. Right wrist swelling. EXAM: RIGHT WRIST - COMPLETE 3+ VIEW COMPARISON:  None. FINDINGS: There is no evidence of fracture or dislocation. Cystic changes in the proximal lunate. There is chondrocalcinosis of the triangular fibrocartilage. Soft tissues are unremarkable. IMPRESSION: 1. No fracture or subluxation of the right wrist. 2.  Chondrocalcinosis of the triangular fibrocartilage. Electronically Signed   By: Keith Rake M.D.   On: 01/23/2022 20:50  ? ?CT Head Wo Contrast ? ?Result Date: 01/23/2022 ?CLINICAL DATA:  Head trauma, moderate-severe.  Fall. EXAM: CT HEAD WITHOUT CONTRAST TECHNIQUE: Contiguous axial images were obtained from the base of

## 2022-01-27 NOTE — Progress Notes (Signed)
Physical Therapy Treatment ?Patient Details ?Name: Mackenzie Madden ?MRN: [id number] ?DOB: 12-06-1927 ?Today's Date: 01/27/2022 ? ? ?History of Present Illness Pt is 88 yo female admitted on 01/23/22 after fall with L distal radius and ulna fx.  Pt is s/p ORIF on 02/23/22 with splint.  Pt also reports some vertigo for 2 weeks.  Pt with hx of HTN, hypothyroidism, hip fx a couple years ago. ? ?  ?PT Comments  ? ? Patient making progress towards physical therapy goals. Patient does continue to be limited by initial fear of mobility due to falling. Patient required less assistance with hemiwalker this date. Up to minA but progressed to min guard with short ambulation distance (but improved distance). Educated patient to continue opening/closing hand and wiggling fingers for reducing swelling. Continue to recommend acute inpatient rehab (AIR) for post-acute therapy needs.  ?  ?Recommendations for follow up therapy are one component of a multi-disciplinary discharge planning process, led by the attending physician.  Recommendations may be updated based on patient status, additional functional criteria and insurance authorization. ? ?Follow Up Recommendations ? Acute inpatient rehab (3hours/day) ?  ?  ?Assistance Recommended at Discharge Frequent or constant Supervision/Assistance  ?Patient can return home with the following A lot of help with walking and/or transfers;A lot of help with bathing/dressing/bathroom;Assistance with cooking/housework ?  ?Equipment Recommendations ? Wheelchair (measurements PT);Wheelchair cushion (measurements PT);Other (comment) (hemiwalker)  ?  ?Recommendations for Other Services   ? ? ?  ?Precautions / Restrictions Precautions ?Precautions: Fall ?Required Braces or Orthoses: Splint/Cast ?Splint/Cast: L wrist, sling for comfort ?Restrictions ?Weight Bearing Restrictions: Yes ?LUE Weight Bearing: Non weight bearing ?Other Position/Activity Restrictions: NWB hand and elbow per ortho  ?   ? ?Mobility ? Bed Mobility ?  ?  ?  ?  ?  ?  ?  ?General bed mobility comments: up in recliner on arrival ?  ? ?Transfers ?Overall transfer level: Needs assistance ?Equipment used: Hemi-Zadaya Cuadra ?Transfers: Sit to/from Stand ?Sit to Stand: Min assist ?  ?  ?  ?  ?  ?General transfer comment: minA to rise and steady from recliner and BSC. Cues for hand placement on seat versus on hemiwalker ?  ? ?Ambulation/Gait ?Ambulation/Gait assistance: Min assist, Min guard ?Gait Distance (Feet): 20 Feet ?Assistive device: Hemi-Sky Primo ?Gait Pattern/deviations: Step-to pattern, Decreased stride length, Trunk flexed ?Gait velocity: decreased ?  ?  ?General Gait Details: improved use of hemiwalker this date. Prefers to place in front of her rather than on side. MinA initially for balance progressing to min guard for short distance ? ? ?Stairs ?  ?  ?  ?  ?  ? ? ?Wheelchair Mobility ?  ? ?Modified Rankin (Stroke Patients Only) ?  ? ? ?  ?Balance Overall balance assessment: Needs assistance ?Sitting-balance support: No upper extremity supported ?Sitting balance-Leahy Scale: Fair ?  ?  ?Standing balance support: Single extremity supported ?Standing balance-Leahy Scale: Poor ?Standing balance comment: up to minA for balance ?  ?  ?  ?  ?  ?  ?  ?  ?  ?  ?  ?  ? ?  ?Cognition Arousal/Alertness: Awake/alert ?Behavior During Therapy: [facility] for tasks assessed/performed ?Overall Cognitive Status: Within Functional Limits for tasks assessed ?  ?  ?  ?  ?  ?  ?  ?  ?  ?  ?  ?  ?  ?  ?  ?  ?  ?  ?  ? ?  ?Exercises   ? ?  ?  General Comments   ?  ?  ? ?Pertinent Vitals/Pain Pain Assessment ?Pain Assessment: Faces ?Faces Pain Scale: Hurts little more ?Pain Location: left lower arm ?Pain Descriptors / Indicators: Discomfort ?Pain Intervention(s): Monitored during session, Repositioned  ? ? ?Home Living   ?  ?  ?  ?  ?  ?  ?  ?  ?  ?   ?  ?Prior Function    ?  ?  ?   ? ?PT Goals (current goals can now be found in the care plan section) Acute Rehab PT  Goals ?Patient Stated Goal: return home ?PT Goal Formulation: With patient ?Time For Goal Achievement: 02/08/22 ?Potential to Achieve Goals: Good ?Progress towards PT goals: Progressing toward goals ? ?  ?Frequency ? ? ? Min 5X/week ? ? ? ?  ?PT Plan Current plan remains appropriate  ? ? ?Co-evaluation   ?  ?  ?  ?  ? ?  ?AM-PAC PT "6 Clicks" Mobility   ?Outcome Measure ? Help needed turning from your back to your side while in a flat bed without using bedrails?: A Lot ?Help needed moving from lying on your back to sitting on the side of a flat bed without using bedrails?: A Lot ?Help needed moving to and from a bed to a chair (including a wheelchair)?: A Lot ?Help needed standing up from a chair using your arms (e.g., wheelchair or bedside chair)?: A Little ?Help needed to walk in hospital room?: A Little ?Help needed climbing 3-5 steps with a railing? : Total ?6 Click Score: 13 ? ?  ?End of Session Equipment Utilized During Treatment: Gait belt;Other (comment) (L UE sling) ?Activity Tolerance: Patient tolerated treatment well ?Patient left: in chair;with call bell/phone within reach;with family/visitor present ?Nurse Communication: Mobility status ?PT Visit Diagnosis: Other abnormalities of gait and mobility (R26.89) ?  ? ? ?Time: 6962-9528 ?PT Time Calculation (min) (ACUTE ONLY): 49 min ? ?Charges:  $Gait Training: 23-37 mins ?$Self Care/Home Management: 8-22          ?          ? ?Maritta Kief A. Gilford Rile, PT, DPT ?Acute Rehabilitation Services ?Pager [phone] ?Office [phone] ? ? ? ?Spenser Cong A Hillery Zachman ?01/27/2022, 5:09 PM ? ?

## 2022-01-28 LAB — CBC WITH DIFFERENTIAL/PLATELET
Abs Immature Granulocytes: 0.08 10*3/uL — ABNORMAL HIGH (ref 0.00–0.07)
Basophils Absolute: 0.1 10*3/uL (ref 0.0–0.1)
Basophils Relative: 1 %
Eosinophils Absolute: 0.4 10*3/uL (ref 0.0–0.5)
Eosinophils Relative: 4 %
HCT: 29.5 % — ABNORMAL LOW (ref 36.0–46.0)
Hemoglobin: 10.2 g/dL — ABNORMAL LOW (ref 12.0–15.0)
Immature Granulocytes: 1 %
Lymphocytes Relative: 31 %
Lymphs Abs: 3.1 10*3/uL (ref 0.7–4.0)
MCH: 32.8 pg (ref 26.0–34.0)
MCHC: 34.6 g/dL (ref 30.0–36.0)
MCV: 94.9 fL (ref 80.0–100.0)
Monocytes Absolute: 1.4 10*3/uL — ABNORMAL HIGH (ref 0.1–1.0)
Monocytes Relative: 14 %
Neutro Abs: 5.1 10*3/uL (ref 1.7–7.7)
Neutrophils Relative %: 49 %
Platelets: 142 10*3/uL — ABNORMAL LOW (ref 150–400)
RBC: 3.11 MIL/uL — ABNORMAL LOW (ref 3.87–5.11)
RDW: 13.7 % (ref 11.5–15.5)
WBC: 10.2 10*3/uL (ref 4.0–10.5)
nRBC: 0 % (ref 0.0–0.2)

## 2022-01-28 LAB — BASIC METABOLIC PANEL
Anion gap: 6 (ref 5–15)
BUN: 9 mg/dL (ref 8–23)
CO2: 24 mmol/L (ref 22–32)
Calcium: 8.4 mg/dL — ABNORMAL LOW (ref 8.9–10.3)
Chloride: 100 mmol/L (ref 98–111)
Creatinine, Ser: 0.8 mg/dL (ref 0.44–1.00)
GFR, Estimated: >60 mL/min (ref >60)
Glucose, Bld: 96 mg/dL (ref 70–99)
Potassium: 4 mmol/L (ref 3.5–5.1)
Sodium: 130 mmol/L — ABNORMAL LOW (ref 135–145)

## 2022-01-28 LAB — MAGNESIUM: Magnesium: 1.8 mg/dL (ref 1.7–2.4)

## 2022-01-28 NOTE — Plan of Care (Signed)

## 2022-01-28 NOTE — Progress Notes (Signed)
Physical Therapy Treatment ?Patient Details ?Name: Mackenzie Madden ?MRN: [id number] ?DOB: 1928-01-02 ?Today's Date: 01/28/2022 ? ? ?History of Present Illness Pt is 88 yo female admitted on 01/23/22 after fall with L distal radius and ulna fx.  Pt is s/p ORIF on 02/23/22 with splint.  Pt also reports some vertigo for 2 weeks.  Pt with hx of HTN, hypothyroidism, hip fx a couple years ago. ? ?  ?PT Comments  ? ? Pt in bed with family present and agreeable to participate. Upon standing pt became incontinent of urine and stated she needed to have a BM. Pt was quickly transferred to [facility] to complete toileting. Total A required for posterior pericare. Pt then ambulated in room with 1x LOB requiring mod A to correct. Daughter and husband present and supportive during session. Pt would do well at AIR as she is very motivated and willing to participate, demonstrates the tolerance for the intensity required, and has supportive family to assist at d/c. Will continue to follow acutely.    ?Recommendations for follow up therapy are one component of a multi-disciplinary discharge planning process, led by the attending physician.  Recommendations may be updated based on patient status, additional functional criteria and insurance authorization. ? ?Follow Up Recommendations ? Acute inpatient rehab (3hours/day) ?  ?  ?Assistance Recommended at Discharge Frequent or constant Supervision/Assistance  ?Patient can return home with the following A lot of help with walking and/or transfers;A lot of help with bathing/dressing/bathroom;Assistance with cooking/housework ?  ?Equipment Recommendations ? Wheelchair (measurements PT);Wheelchair cushion (measurements PT);Other (comment) (hemiwalker)  ?  ?Recommendations for Other Services Rehab consult ? ? ?  ?Precautions / Restrictions Precautions ?Precautions: Fall ?Required Braces or Orthoses: Splint/Cast ?Splint/Cast: L wrist, sling for comfort ?Restrictions ?Weight Bearing Restrictions:  Yes ?LUE Weight Bearing: Non weight bearing ?Other Position/Activity Restrictions: NWB hand and elbow per ortho  ?  ? ?Mobility ? Bed Mobility ?Overal bed mobility: Needs Assistance ?Bed Mobility: Supine to Sit ?  ?  ?Supine to sit: Mod assist ?  ?  ?General bed mobility comments: mod A for trunk elevation and to scoot forward. ?  ? ?Transfers ?Overall transfer level: Needs assistance ?Equipment used: Hemi-walker ?Transfers: Sit to/from Stand ?Sit to Stand: Mod assist ?  ?Step pivot transfers: Mod assist ?  ?  ?  ?General transfer comment: mod A to rise with cues for hand placement. Pt with weight shifted posteriorly when trying to rise. ?  ? ?Ambulation/Gait ?Ambulation/Gait assistance: Min assist, Mod assist ?Gait Distance (Feet): 25 Feet ?Assistive device: Hemi-walker ?Gait Pattern/deviations: Step-to pattern, Decreased stride length, Trunk flexed, Shuffle ?Gait velocity: decreased ?Gait velocity interpretation: <1.31 ft/sec, indicative of household ambulator ?  ?General Gait Details: pt with increased shuffle this session. Places himi walker in front of her for stability. When cued to take larger steps pt experianced 1x LOB requiring mod A to correct. min A throughout gait for stability. ? ? ?Stairs ?  ?  ?  ?  ?  ? ? ?Wheelchair Mobility ?  ? ?Modified Rankin (Stroke Patients Only) ?  ? ? ?  ?Balance Overall balance assessment: Needs assistance ?Sitting-balance support: No upper extremity supported ?Sitting balance-Leahy Scale: Fair ?Sitting balance - Comments: still with posterior lean and LOB at times if trying to lift her LEs for dressing tasks. ?  ?Standing balance support: Single extremity supported ?Standing balance-Leahy Scale: Poor ?Standing balance comment: up to minA for balance ?  ?  ?  ?  ?  ?  ?  ?  ?  ?  ?  ?  ? ?  ?  Cognition Arousal/Alertness: Awake/alert ?Behavior During Therapy: [facility] for tasks assessed/performed ?Overall Cognitive Status: Within Functional Limits for tasks assessed ?  ?  ?  ?  ?   ?  ?  ?  ?  ?  ?  ?  ?  ?  ?  ?  ?General Comments: HOH with cognition at baseline for age ?  ?  ? ?  ?Exercises   ? ?  ?General Comments General comments (skin integrity, edema, etc.): Daughter present and supportive. ?  ?  ? ?Pertinent Vitals/Pain Pain Assessment ?Pain Assessment: Faces ?Faces Pain Scale: Hurts little more ?Pain Location: left lower arm ?Pain Descriptors / Indicators: Discomfort ?Pain Intervention(s): Limited activity within patient's tolerance, Monitored during session, Repositioned  ? ? ?Home Living   ?  ?  ?  ?  ?  ?  ?  ?  ?  ?   ?  ?Prior Function    ?  ?  ?   ? ?PT Goals (current goals can now be found in the care plan section) Acute Rehab PT Goals ?Patient Stated Goal: return home ?PT Goal Formulation: With patient ?Time For Goal Achievement: 02/08/22 ?Potential to Achieve Goals: Good ?Progress towards PT goals: Progressing toward goals ? ?  ?Frequency ? ? ? Min 5X/week ? ? ? ?  ?PT Plan Current plan remains appropriate  ? ? ?Co-evaluation   ?  ?  ?  ?  ? ?  ?AM-PAC PT "6 Clicks" Mobility   ?Outcome Measure ? Help needed turning from your back to your side while in a flat bed without using bedrails?: A Lot ?Help needed moving from lying on your back to sitting on the side of a flat bed without using bedrails?: A Lot ?Help needed moving to and from a bed to a chair (including a wheelchair)?: A Lot ?Help needed standing up from a chair using your arms (e.g., wheelchair or bedside chair)?: A Little ?Help needed to walk in hospital room?: A Little ?Help needed climbing 3-5 steps with a railing? : Total ?6 Click Score: 13 ? ?  ?End of Session Equipment Utilized During Treatment: Gait belt;Other (comment) (L UE sling) ?Activity Tolerance: Patient tolerated treatment well ?Patient left: in chair;with call bell/phone within reach;with family/visitor present ?Nurse Communication: Mobility status ?PT Visit Diagnosis: Other abnormalities of gait and mobility (R26.89) ?  ? ? ?Time: 1455-1530 ?PT Time  Calculation (min) (ACUTE ONLY): 35 min ? ?Charges:  $Gait Training: 8-22 mins ?$Therapeutic Activity: 8-22 mins          ?          ? ?Benjiman Core, PTA ?Acute Rehab ? ?Allena Katz ?01/28/2022, 3:43 PM ? ?

## 2022-01-28 NOTE — Progress Notes (Signed)
?                                  PROGRESS NOTE                                             ?                                                                                                                     ?                                         ? ? Patient Demographics:  ? ? Mackenzie Madden, is a 88 y.o. female, DOB - Oct 28, 1927, [id number] ? ?Outpatient Primary MD for the patient is Lavone Orn, MD    LOS - 4  Admit date - 01/23/2022   ? ?Chief Complaint  ?Patient presents with  ? Fall  ?    ? ?Brief Narrative (HPI from H&P)   88 y.o. female with medical history significant of  hypertension, hypothyroidism was transferred from [facility] ED after experiencing a ground-level fall and was noted to have left wrist fracture.  Patient had been having vertigo or the last 2 weeks.  She was admitted for her left wrist fracture repair. ? ? Subjective:  ? ?Patient in bed appears to be in no discomfort denies any headache chest or abdominal pain. ? ? Assessment  & Plan :  ? ? ?Vertigo related fall with left distal radius open fracture.  S/p surgical fixation by hand surgeon Dr. Greta Doom on 01/25/2022, left arm in splint, seems to have tolerated the procedure well, elderly patient who lives with her husband cannot care for herself right now.  PT OT and likely will require placement as well >> SNF/CIR. NWB L. hand.  Clinically stable await bed. ? ? 2. Ongoing vertigo for several weeks.  Could be due to BPV, CT and MRI brain nonacute.  Add meclizine . ? ?3.  Essential hypertension.  On Coreg. ? ?4.  Hypothyroidism.  On Synthroid. ? ?5.  Cephalosporin sensitive E. Coli UTI present on admission.  Keflex x 5 days.  Has finished her course. ? ?6.  Asymptomatic transaminitis.  Non specific [place], resolved, stable TB and INR. ? ?7.  Hyponatremia due to SIADH.  Stop fluids gentle Lasix and monitor. ? ?   ? ?Condition - Fair ? ?Family Communication  : Husband bedside on 01/25/2022, daughter  bedside 01/26/22 ? ?Code Status :  Full ? ?Consults  :  Hand surgery ? ?PUD Prophylaxis :  ? ? Procedures  :    ? ?Right upper quadrant ultrasound. ? ?CT head and MRI brain.  Nonacute. ? ?ORIF left wrist by  hand surgery on 01/25/2019 - Dr Greta Doom ? ?LEFT distal radius open reduction internal fixation, INTRA- articular, 3 fragments ?LEFT  ?3.  LEFT wrist brachioradialis tenotomy  ?4.  LEFT wrist radiographs four views with intraoperative interpretation ? ?   ? ?Disposition Plan  :   ? ?Status is: Inpatient ? ?DVT Prophylaxis  :  Heparin 01/25/22 ? ?heparin injection 5,000 Units Start: 01/25/22 1400 ?Place and maintain sequential compression device Start: 01/24/22 1554 ? ?Lab Results  ?Component Value Date  ? PLT 142 (L) 01/28/2022  ? ? ?Diet :  ?Diet Order   ? ?       ?  Diet regular Room service appropriate? Yes; Fluid consistency: Thin  Diet effective now       ?  ? ?  ?  ? ?  ?  ? ?Inpatient Medications ? ?Scheduled Meds: ? calcium carbonate  1,250 mg Oral Q breakfast  ? docusate sodium  100 mg Oral BID  ? dorzolamide-timolol  1 drop Both Eyes BID  ? heparin injection (subcutaneous)  5,000 Units Subcutaneous Q8H  ? latanoprost  1 drop Both Eyes QHS  ? levothyroxine  88 mcg Oral QAC breakfast  ? meclizine  12.5 mg Oral TID  ? multivitamin with minerals  1 tablet Oral Daily  ? ?Continuous Infusions: ? ? ?PRN Meds:.acetaminophen **OR** acetaminophen, hydrALAZINE, HYDROcodone-acetaminophen, ondansetron **OR** ondansetron (ZOFRAN) IV, polyethylene glycol ? ? ? Time Spent in minutes  30 ? ? ?Lala Lund M.D on 01/28/2022 at 8:57 AM ? ?To page go to [email or web address]  ? ?[facility] -  Office  [phone] ? ?See all Orders from today for further details ? ? ? Objective:  ? ?Vitals:  ? 01/27/22 1952 01/27/22 2309 01/28/22 0000 01/28/22 0303  ?BP: (!) 167/81 (!) 154/65 111/60 (!) 145/69  ?Pulse: 82 77 74 77  ?Resp: (!) '21 20 19 18  '$ ?Temp: 98 ?F (36.7 ?C) 98.3 ?F (36.8 ?C)    ?TempSrc: Oral Oral  Oral  ?SpO2: 98% 92% 94%  95%  ?Weight:      ?Height:      ? ? ?Wt Readings from Last 3 Encounters:  ?01/27/22 52.4 kg  ?01/02/22 45.4 kg  ?08/05/19 44.9 kg  ? ? ?No intake or output data in the 24 hours ending 01/28/22 0857 ? ? ? ?Physical Exam ? ?Awake Alert, No new F.N deficits, ++ bilateral hearing loss ?[place].AT,PERRAL ?Supple Neck, No JVD,   ?Symmetrical Chest wall movement, Good air movement bilaterally, CTAB ?RRR,No Gallops, Rubs or new Murmurs,  ?+ve B.Sounds, Abd Soft, No tenderness,   ? ?Left arm under bandage , edema almost completely resolved  ? ? Data Review:  ? ? ?CBC ?Recent Labs  ?Lab 01/23/22 ?1906 01/25/22 ?0101 01/26/22 ?0100 01/27/22 ?0331 01/28/22 ?0101  ?WBC 9.0 9.8 8.7 10.1 10.2  ?HGB 13.2 11.9* 10.1* 10.5* 10.2*  ?HCT 40.2 36.2 30.1* 30.7* 29.5*  ?PLT 145* 126* 119* 126* 142*  ?MCV 95.9 98.4 95.3 95.3 94.9  ?MCH 31.5 32.3 32.0 32.6 32.8  ?MCHC 32.8 32.9 33.6 34.2 34.6  ?RDW 13.9 13.6 13.4 13.6 13.7  ?LYMPHSABS  --   --  2.1 2.5 3.1  ?MONOABS  --   --  1.4* 1.3* 1.4*  ?EOSABS  --   --  0.1 0.3 0.4  ?BASOSABS  --   --  0.0 0.0 0.1  ? ? ?Electrolytes ?Recent Labs  ?Lab 01/23/22 ?1906 01/25/22 ?0101 01/26/22 ?0100 01/27/22 ?0331 01/28/22 ?0101  ?NA 132* 132* 132* 130* 130*  ?  K 4.8 3.7 3.1* 5.1 4.0  ?CL 99 104 105 106 100  ?CO2 26 21* 23 21* 24  ?GLUCOSE 130* 142* 106* 102* 96  ?BUN '12 11 8 8 9  '$ ?CREATININE 0.82 0.83 0.66 0.71 0.80  ?CALCIUM 9.8 8.0* 7.7* 8.3* 8.4*  ?AST  --  277* 67* 68*  --   ?ALT  --  93* 39 49*  --   ?ALKPHOS  --  139* 108 156*  --   ?BILITOT  --  1.2 0.7 0.6  --   ?ALBUMIN  --  2.6* 2.2* 2.3*  --   ?MG  --   --  1.5* 2.1 1.8  ?INR  --  1.2  --   --   --   ?BNP  --   --  630.4* 380.9*  --   ? ? ?------------------------------------------------------------------------------------------------------------------ ?No results for input(s): CHOL, HDL, LDLCALC, TRIG, CHOLHDL, LDLDIRECT in the last 72 hours. ? ?No results found for: HGBA1C ? ?No results for input(s): TSH, T4TOTAL, T3FREE, THYROIDAB in the last 72  hours. ? ?Invalid input(s): FREET3 ?------------------------------------------------------------------------------------------------------------------ ?ID Labs ?Recent Labs  ?Lab 01/23/22 ?1906 01/25/22 ?0101 01/26/22 ?0100 01/27/22 ?0331 01/28/22 ?0101  ?WBC 9.0 9.8 8.7 10.1 10.2  ?PLT 145* 126* 119* 126* 142*  ?CREATININE 0.82 0.83 0.66 0.71 0.80  ? ?Cardiac Enzymes ?No results for input(s): CKMB, TROPONINI, MYOGLOBIN in the last 168 hours. ? ?Invalid input(s): CK ? ? ?  ?Radiology Reports ?MR BRAIN WO CONTRAST ? ?Result Date: 01/24/2022 ?CLINICAL DATA:  Dizziness.  Recent fall EXAM: MRI HEAD WITHOUT CONTRAST TECHNIQUE: Multiplanar, multiecho pulse sequences of the brain and surrounding structures were obtained without intravenous contrast. COMPARISON:  CT head 01/23/2022.  MRI head 07/29/2016 FINDINGS: Brain: Moderate atrophy and moderate chronic microvascular ischemic change with progression since 2017. Mild chronic microvascular ischemic change in the pons. Negative for acute infarct or mass. Chronic microhemorrhage in the right occipital lobe and right temporal lobe, new since 2017 Vascular: Normal arterial flow voids at the skull base Skull and upper cervical spine: No focal skeletal lesion. Sinuses/Orbits: Paranasal sinuses clear. Bilateral cataract extraction Other: None IMPRESSION: Negative for acute infarct Progressive atrophy and chronic microvascular ischemia. Progressive chronic microhemorrhage right temporal lobe and right occipital lobe since 2017. Electronically Signed   By: Franchot Gallo M.D.   On: 01/24/2022 14:19  ? ?DG MINI C-ARM IMAGE ONLY ? ?Result Date: 01/24/2022 ?There is no interpretation for this exam.  This order is for images obtained during a surgical procedure.  Please See "Surgeries" Tab for more information regarding the procedure.  ? ?US Abdomen Limited RUQ (LIVER/GB) ? ?Result Date: 01/25/2022 ?CLINICAL DATA:  Transaminitis EXAM: ULTRASOUND ABDOMEN LIMITED RIGHT UPPER QUADRANT  COMPARISON:  None. FINDINGS: Gallbladder: Status post cholecystectomy. Common bile duct: Diameter: Severely dilated common bile duct to the ampulla, measuring 2.6 cm in caliber. Intrahepatic biliary ductal dila

## 2022-01-29 NOTE — Progress Notes (Signed)
?                                  PROGRESS NOTE                                             ?                                                                                                                     ?                                         ? ? Patient Demographics:  ? ? Mackenzie Madden, is a 88 y.o. female, DOB - 1927/11/13, [id number] ? ?Outpatient Primary MD for the patient is Lavone Orn, MD    LOS - 5  Admit date - 01/23/2022   ? ?Chief Complaint  ?Patient presents with  ? Fall  ?    ? ?Brief Narrative (HPI from H&P)   88 y.o. female with medical history significant of  hypertension, hypothyroidism was transferred from [facility] ED after experiencing a ground-level fall and was noted to have left wrist fracture.  Patient had been having vertigo or the last 2 weeks.  She was admitted for her left wrist fracture repair. ? ? Subjective:  ? ?Patient in bed, appears comfortable, denies any headache, no fever, no chest pain or pressure, no shortness of breath , no abdominal pain. No new focal weakness. ? ? Assessment  & Plan :  ? ? ?Vertigo related fall with left distal radius open fracture.  S/p surgical fixation by hand surgeon Dr. Greta Doom on 01/25/2022, left arm in splint, seems to have tolerated the procedure well, elderly patient who lives with her husband cannot care for herself right now.  PT OT and likely will require placement as well >> SNF/CIR. NWB L. hand.  Clinically stable await bed. ? ? 2. Ongoing vertigo for several weeks.  Could be due to BPV, CT and MRI brain nonacute.  Add meclizine . ? ?3.  Essential hypertension.  On Coreg. ? ?4.  Hypothyroidism.  On Synthroid. ? ?5.  Cephalosporin sensitive E. Coli UTI present on admission.  Keflex x 5 days.  Has finished her course. ? ?6.  Asymptomatic transaminitis.  Non specific [place], resolved, stable TB and INR. ? ?7.  Hyponatremia due to SIADH.  Stopped fluids , improved. ? ?   ? ?Condition - Fair ? ?Family  Communication  : Husband bedside on 01/25/2022, daughter bedside 01/26/22, 01/29/22 ? ?Code Status :  Full ? ?Consults  :  Hand surgery ? ?PUD Prophylaxis :  ? ? Procedures  :    ? ?Right upper quadrant ultrasound. ? ?CT head  and MRI brain.  Nonacute. ? ?ORIF left wrist by hand surgery on 01/25/2019 - Dr Greta Doom ? ?LEFT distal radius open reduction internal fixation, INTRA- articular, 3 fragments ?LEFT  ?3.  LEFT wrist brachioradialis tenotomy  ?4.  LEFT wrist radiographs four views with intraoperative interpretation ? ?   ? ?Disposition Plan  :   ? ?Status is: Inpatient ? ?DVT Prophylaxis  :  Heparin 01/25/22 ? ?heparin injection 5,000 Units Start: 01/25/22 1400 ?Place and maintain sequential compression device Start: 01/24/22 1554 ? ?Lab Results  ?Component Value Date  ? PLT 142 (L) 01/28/2022  ? ? ?Diet :  ?Diet Order   ? ?       ?  Diet regular Room service appropriate? Yes; Fluid consistency: Thin  Diet effective now       ?  ? ?  ?  ? ?  ?  ? ?Inpatient Medications ? ?Scheduled Meds: ? calcium carbonate  1,250 mg Oral Q breakfast  ? docusate sodium  100 mg Oral BID  ? dorzolamide-timolol  1 drop Both Eyes BID  ? heparin injection (subcutaneous)  5,000 Units Subcutaneous Q8H  ? latanoprost  1 drop Both Eyes QHS  ? levothyroxine  88 mcg Oral QAC breakfast  ? meclizine  12.5 mg Oral TID  ? multivitamin with minerals  1 tablet Oral Daily  ? ?Continuous Infusions: ? ? ?PRN Meds:.acetaminophen **OR** acetaminophen, hydrALAZINE, HYDROcodone-acetaminophen, ondansetron **OR** ondansetron (ZOFRAN) IV, polyethylene glycol ? ? ? Time Spent in minutes  30 ? ? ?Lala Lund M.D on 01/29/2022 at 9:21 AM ? ?To page go to [email or web address]  ? ?[facility] -  Office  [phone] ? ?See all Orders from today for further details ? ? ? Objective:  ? ?Vitals:  ? 01/29/22 0007 01/29/22 0320 01/29/22 0400 01/29/22 0759  ?BP: 126/69  (!) 151/71 (!) 145/74  ?Pulse: 73  70 85  ?Resp: '12  16 16  '$ ?Temp: 98.1 ?F (36.7 ?C)  98 ?F (36.7 ?C)  98.5 ?F (36.9 ?C)  ?TempSrc: Oral  Oral Oral  ?SpO2: 93%  95% 94%  ?Weight:  49.6 kg    ?Height:      ? ? ?Wt Readings from Last 3 Encounters:  ?01/29/22 49.6 kg  ?01/02/22 45.4 kg  ?08/05/19 44.9 kg  ? ? ? ?Intake/Output Summary (Last 24 hours) at 01/29/2022 0921 ?Last data filed at 01/29/2022 0319 ?Gross per 24 hour  ?Intake --  ?Output 400 ml  ?Net -400 ml  ? ? ? ? ?Physical Exam ? ?Awake Alert, No new F.N deficits, ++ bilat.hearing loss ?[place].AT,PERRAL ?Supple Neck, No JVD,   ?Symmetrical Chest wall movement, Good air movement bilaterally, CTAB ?RRR,No Gallops, Rubs or new Murmurs,  ?+ve B.Sounds, Abd Soft, No tenderness,   ? ?Left arm under bandage , edema almost completely resolved  ? ? Data Review:  ? ? ?CBC ?Recent Labs  ?Lab 01/23/22 ?1906 01/25/22 ?0101 01/26/22 ?0100 01/27/22 ?0331 01/28/22 ?0101  ?WBC 9.0 9.8 8.7 10.1 10.2  ?HGB 13.2 11.9* 10.1* 10.5* 10.2*  ?HCT 40.2 36.2 30.1* 30.7* 29.5*  ?PLT 145* 126* 119* 126* 142*  ?MCV 95.9 98.4 95.3 95.3 94.9  ?MCH 31.5 32.3 32.0 32.6 32.8  ?MCHC 32.8 32.9 33.6 34.2 34.6  ?RDW 13.9 13.6 13.4 13.6 13.7  ?LYMPHSABS  --   --  2.1 2.5 3.1  ?MONOABS  --   --  1.4* 1.3* 1.4*  ?EOSABS  --   --  0.1 0.3 0.4  ?BASOSABS  --   --  0.0 0.0 0.1  ? ? ?Electrolytes ?Recent Labs  ?Lab 01/23/22 ?1906 01/25/22 ?0101 01/26/22 ?0100 01/27/22 ?0331 01/28/22 ?0101  ?NA 132* 132* 132* 130* 130*  ?K 4.8 3.7 3.1* 5.1 4.0  ?CL 99 104 105 106 100  ?CO2 26 21* 23 21* 24  ?GLUCOSE 130* 142* 106* 102* 96  ?BUN '12 11 8 8 9  '$ ?CREATININE 0.82 0.83 0.66 0.71 0.80  ?CALCIUM 9.8 8.0* 7.7* 8.3* 8.4*  ?AST  --  277* 67* 68*  --   ?ALT  --  93* 39 49*  --   ?ALKPHOS  --  139* 108 156*  --   ?BILITOT  --  1.2 0.7 0.6  --   ?ALBUMIN  --  2.6* 2.2* 2.3*  --   ?MG  --   --  1.5* 2.1 1.8  ?INR  --  1.2  --   --   --   ?BNP  --   --  630.4* 380.9*  --   ? ? ?------------------------------------------------------------------------------------------------------------------ ?No results for input(s): CHOL, HDL,  LDLCALC, TRIG, CHOLHDL, LDLDIRECT in the last 72 hours. ? ?No results found for: HGBA1C ? ?No results for input(s): TSH, T4TOTAL, T3FREE, THYROIDAB in the last 72 hours. ? ?Invalid input(s): FREET3 ?------------------------------------------------------------------------------------------------------------------ ?ID Labs ?Recent Labs  ?Lab 01/23/22 ?1906 01/25/22 ?0101 01/26/22 ?0100 01/27/22 ?0331 01/28/22 ?0101  ?WBC 9.0 9.8 8.7 10.1 10.2  ?PLT 145* 126* 119* 126* 142*  ?CREATININE 0.82 0.83 0.66 0.71 0.80  ? ?Cardiac Enzymes ?No results for input(s): CKMB, TROPONINI, MYOGLOBIN in the last 168 hours. ? ?Invalid input(s): CK ? ? ?  ?Radiology Reports ?US Abdomen Limited RUQ (LIVER/GB) ? ?Result Date: 01/25/2022 ?CLINICAL DATA:  Transaminitis EXAM: ULTRASOUND ABDOMEN LIMITED RIGHT UPPER QUADRANT COMPARISON:  None. FINDINGS: Gallbladder: Status post cholecystectomy. Common bile duct: Diameter: Severely dilated common bile duct to the ampulla, measuring 2.6 cm in caliber. Intrahepatic biliary ductal dilatation. Liver: No focal lesion identified. Within normal limits in parenchymal echogenicity. Portal vein is patent on color Doppler imaging with normal direction of blood flow towards the liver. Other: Small right pleural effusion. IMPRESSION: 1. Status post cholecystectomy. 2. Severely dilated common bile duct to the ampulla, measuring 2.6 cm in caliber. Intrahepatic biliary ductal dilatation. This may reflect benign postoperative biliary ductal dilatation, particularly given advanced patient age, however consider CT or MRI to further evaluate for obstructing etiology if there is clinical evidence of cholestasis. 3. Small right pleural effusion. Electronically Signed   By: Delanna Ahmadi M.D.   On: 01/25/2022 10:13    ? ? ?

## 2022-01-29 NOTE — Progress Notes (Signed)
Patient complaining of left arm "burning" under dressing. Candiss Norse, MD notified.  ?

## 2022-01-29 NOTE — Progress Notes (Signed)
Inpatient Rehab Admissions Coordinator:  ? ?Insurance denied CIR Friday 01/27/22 and appeal was filed immediately afterwards. Now await decision on appeal. ? ?Clemens Catholic, MS, CCC-SLP ?Rehab Admissions Coordinator  ?[phone] (celll) ?[phone] (office) ? ?

## 2022-01-30 NOTE — Progress Notes (Signed)
Inpatient Rehab Admissions Coordinator:  ?Continue to await appeal decision. Will continue to follow. ? ?Gayland Curry, MS, CCC-SLP ?Admissions Coordinator ?[phone] ? ?

## 2022-01-30 NOTE — Progress Notes (Signed)
Occupational Therapy Treatment ?Patient Details ?Name: Mackenzie Madden ?MRN: [id number] ?DOB: 1928-04-19 ?Today's Date: 01/30/2022 ? ? ?History of present illness Pt is 88 yo female admitted on 01/23/22 after fall with L distal radius and ulna fx.  Pt is s/p ORIF on 02/23/22 with splint.  Pt also reports some vertigo for 2 weeks.  Pt with hx of HTN, hypothyroidism, hip fx a couple years ago. ?  ?OT comments ? Pt with not gauze wrapped around her right wrist splint, instead of ace bandage.  Therapist obtained ace bandage and re-wrapped splint, with nursing being made aware.  Pt reported no pain during session with splint itself but did report some stinging previously per family/pt report.  She still needed max assist for LB dressing this session with mod assist for sit to stand, transfers, and functional mobility.  Will continue to benefit from acute care OT to address these issue with selfcare and ADL independence.  Still recommending AIR for increased progress before transition back home.   ? ?Recommendations for follow up therapy are one component of a multi-disciplinary discharge planning process, led by the attending physician.  Recommendations may be updated based on patient status, additional functional criteria and insurance authorization. ?   ?Follow Up Recommendations ? Acute inpatient rehab (3hours/day)  ?  ?Assistance Recommended at Discharge Frequent or constant Supervision/Assistance  ?Patient can return home with the following ? A little help with walking and/or transfers;A little help with bathing/dressing/bathroom;Assistance with cooking/housework;Direct supervision/assist for financial management;Help with stairs or ramp for entrance;Assist for transportation;Direct supervision/assist for medications management;Assistance with feeding ?  ?Equipment Recommendations ? Other (comment) (TBD next venue of care)  ?  ?   ?Precautions / Restrictions Precautions ?Precautions: Fall ?Required Braces or  Orthoses: Splint/Cast ?Splint/Cast: L wrist, sling for comfort ?Restrictions ?Weight Bearing Restrictions: Yes ?LUE Weight Bearing: Non weight bearing ?Other Position/Activity Restrictions: NWB hand and elbow per ortho  ? ? ?  ? ?Mobility Bed Mobility ?Overal bed mobility: Needs Assistance ?Bed Mobility: Supine to Sit ?  ?  ?Supine to sit: Mod assist ?  ?  ?General bed mobility comments: mod A for trunk elevation and to scoot forward. ?  ? ?Transfers ?Overall transfer level: Needs assistance ?  ?Transfers: Sit to/from Stand ?Sit to Stand: Mod assist ?  ?  ?Step pivot transfers: Mod assist ?  ?  ?General transfer comment: Pt ambulated around the end of the bed and to the bedside recliner.  Using the foot board for assistance with the non-involved RUE. ?  ?  ?Balance Overall balance assessment: Needs assistance ?Sitting-balance support: No upper extremity supported ?Sitting balance-Leahy Scale: Fair ?Sitting balance - Comments: Slight posterior lean still present when attempting LB dressing. ?  ?Standing balance support: Single extremity supported ?Standing balance-Leahy Scale: Poor ?Standing balance comment: Mod assist for standing balance and mobility. ?  ?  ?  ?  ?  ?  ?  ?  ?  ?  ?  ?   ? ?ADL either performed or assessed with clinical judgement  ? ?ADL Overall ADL's : Needs assistance/impaired ?  ?  ?  ?  ?  ?  ?  ?  ?Upper Body Dressing : Maximal assistance;Sitting ?Upper Body Dressing Details (indicate cue type and reason): sling only ?Lower Body Dressing: Maximal assistance;Sit to/from stand ?Lower Body Dressing Details (indicate cue type and reason): donning incontinence brief ?  ?  ?  ?  ?  ?  ?Functional mobility during ADLs: Moderate assistance ?General  ADL Comments: Therapist re-wrapped ace bandage around splint as nursing had wrapped with gauze since no ace bandages were found on the unit.  Mod assist for transfer to the EOB with max assist for threading her brief and standing to pull it up over her  hips.  Noted bladder incontinence with sit to stand as well. ?  ? ? ?   ?   ?   ? ?Cognition Arousal/Alertness: Awake/alert ?Behavior During Therapy: [facility] for tasks assessed/performed ?Overall Cognitive Status: Within Functional Limits for tasks assessed ?  ?  ?  ?  ?  ?  ?  ?  ?  ?  ?  ?  ?  ?  ?  ?  ?General Comments: HOH with cognition at baseline for age ?  ?  ?   ?   ?   ?   ? ? ?Pertinent Vitals/ Pain       Pain Assessment ?Pain Assessment: Faces ?Faces Pain Scale: Hurts a little bit ?Pain Location: left lower arm ?Pain Descriptors / Indicators: Discomfort ?Pain Intervention(s): Limited activity within patient's tolerance, Repositioned ? ?   ?   ? ?Frequency ? Min 2X/week  ? ? ? ? ?  ?Progress Toward Goals ? ?OT Goals(current goals can now be found in the care plan section) ? Progress towards OT goals: Progressing toward goals ? ?Acute Rehab OT Goals ?Patient Stated Goal: Pt did not state but agreeable to OOB to the recliner. ?OT Goal Formulation: With patient ?Time For Goal Achievement: 02/08/22 ?Potential to Achieve Goals: Good  ?Plan Discharge plan needs to be updated;Discharge plan remains appropriate   ? ?   ?AM-PAC OT "6 Clicks" Daily Activity     ?Outcome Measure ? ? Help from another person eating meals?: A Little ?Help from another person taking care of personal grooming?: A Little ?Help from another person toileting, which includes using toliet, bedpan, or urinal?: A Lot ?Help from another person bathing (including washing, rinsing, drying)?: A Lot ?Help from another person to put on and taking off regular upper body clothing?: A Lot ?Help from another person to put on and taking off regular lower body clothing?: A Lot ?6 Click Score: 14 ? ?  ?End of Session Equipment Utilized During Treatment: Other (comment) (sling) ? ?OT Visit Diagnosis: Unsteadiness on feet (R26.81);Repeated falls (R29.6);Muscle weakness (generalized) (M62.81) ?  ?Activity Tolerance Patient tolerated treatment well ?  ?Patient  Left in bed;with call bell/phone within reach;with bed alarm set;with family/visitor present ?  ?Nurse Communication Mobility status (that therapist took off gauze from splint and applied ace bandage.) ?  ? ?   ? ?Time: 3536-1443 ?OT Time Calculation (min): 48 min ? ?Charges: OT General Charges ?$OT Visit: 1 Visit ?OT Treatments ?$Self Care/Home Management : 23-37 mins ?$Therapeutic Activity: 8-22 mins ? ?Amina Menchaca OTR/L ?01/30/2022, 1:53 PM ?

## 2022-01-30 NOTE — Progress Notes (Signed)
Physical Therapy Treatment ?Patient Details ?Name: Mackenzie Madden ?MRN: [id number] ?DOB: 1928/09/27 ?Today's Date: 01/30/2022 ? ? ?History of Present Illness Pt is 88 yo female admitted on 01/23/22 after fall with L distal radius and ulna fx.  Pt is s/p ORIF on 02/23/22 with splint.  Pt also reports some vertigo for 2 weeks.  Pt with hx of HTN, hypothyroidism, hip fx a couple years ago. ? ?  ?PT Comments  ? ? Patient progressing slowly, able to increase distance, but still very slow and laborious with hemiwalker with short stride length, visually dependent and continues to be at high risk for falls.  Family very supportive.  Remains appropriate for acute inpatient rehab at d/c.    ?Recommendations for follow up therapy are one component of a multi-disciplinary discharge planning process, led by the attending physician.  Recommendations may be updated based on patient status, additional functional criteria and insurance authorization. ? ?Follow Up Recommendations ? Acute inpatient rehab (3hours/day) ?  ?  ?Assistance Recommended at Discharge Frequent or constant Supervision/Assistance  ?Patient can return home with the following A lot of help with walking and/or transfers;A lot of help with bathing/dressing/bathroom;Assistance with cooking/housework ?  ?Equipment Recommendations ? Wheelchair (measurements PT);Wheelchair cushion (measurements PT);Other (comment)  ?  ?Recommendations for Other Services   ? ? ?  ?Precautions / Restrictions Precautions ?Precautions: Fall ?Required Braces or Orthoses: Splint/Cast ?Splint/Cast: L wrist, sling for comfort ?Restrictions ?Weight Bearing Restrictions: Yes ?LUE Weight Bearing: Non weight bearing ?Other Position/Activity Restrictions: NWB hand and elbow per ortho  ?  ? ?Mobility ? Bed Mobility ?  ?  ?  ?  ?  ?  ?  ?General bed mobility comments: up in recliner ?  ? ?Transfers ?Overall transfer level: Needs assistance ?Equipment used: Hemi-walker ?Transfers: Sit to/from  Stand ?Sit to Stand: Min assist ?  ?  ?  ?  ?  ?General transfer comment: up from recliner with armrest on R and cues ?  ? ?Ambulation/Gait ?Ambulation/Gait assistance: Min assist, Mod assist ?Gait Distance (Feet): 40 Feet ?Assistive device: Hemi-walker ?Gait Pattern/deviations: Step-to pattern, Decreased stride length, Trunk flexed, Shuffle, Narrow base of support ?  ?  ?  ?General Gait Details: cues throughout for posture, forward gaze, walker management and sequencing steps; assist throughout for balance ? ? ?Stairs ?  ?  ?  ?  ?  ? ? ?Wheelchair Mobility ?  ? ?Modified Rankin (Stroke Patients Only) ?  ? ? ?  ?Balance Overall balance assessment: Needs assistance ?  ?Sitting balance-Leahy Scale: Fair ?  ?  ?Standing balance support: Single extremity supported, Reliant on assistive device for balance ?Standing balance-Leahy Scale: Poor ?Standing balance comment: min A and UE support for balance ?  ?  ?  ?  ?  ?  ?  ?  ?  ?  ?  ?  ? ?  ?Cognition Arousal/Alertness: Awake/alert ?Behavior During Therapy: [facility] for tasks assessed/performed ?Overall Cognitive Status: Within Functional Limits for tasks assessed ?  ?  ?  ?  ?  ?  ?  ?  ?  ?  ?  ?  ?  ?  ?  ?  ?General Comments: HOH with cognition at baseline for age ?  ?  ? ?  ?Exercises Other Exercises ?Other Exercises: hand exercises, fist and open, abduction adduction of fingers AROM ?Other Exercises: thumb AAROM flex/ext and abd/add ?Other Exercises: elbow flex/ext AAROM ? ?  ?General Comments General comments (skin integrity, edema, etc.): family present  and supportive, report awaiting appeal for acute inpatient rehab ?  ?  ? ?Pertinent Vitals/Pain Pain Assessment ?Faces Pain Scale: Hurts little more ?Pain Location: left lower arm ?Pain Descriptors / Indicators: Discomfort ?Pain Intervention(s): Monitored during session  ? ? ?Home Living   ?  ?  ?  ?  ?  ?  ?  ?  ?  ?   ?  ?Prior Function    ?  ?  ?   ? ?PT Goals (current goals can now be found in the care plan  section) Progress towards PT goals: Progressing toward goals ? ?  ?Frequency ? ? ? Min 5X/week ? ? ? ?  ?PT Plan Current plan remains appropriate  ? ? ?Co-evaluation   ?  ?  ?  ?  ? ?  ?AM-PAC PT "6 Clicks" Mobility   ?Outcome Measure ? Help needed turning from your back to your side while in a flat bed without using bedrails?: A Lot ?Help needed moving from lying on your back to sitting on the side of a flat bed without using bedrails?: A Lot ?Help needed moving to and from a bed to a chair (including a wheelchair)?: A Lot ?Help needed standing up from a chair using your arms (e.g., wheelchair or bedside chair)?: A Little ?Help needed to walk in hospital room?: A Little ?Help needed climbing 3-5 steps with a railing? : Total ?6 Click Score: 13 ? ?  ?End of Session Equipment Utilized During Treatment: Gait belt;Other (comment) (L sling) ?Activity Tolerance: Patient tolerated treatment well ?Patient left: in chair;with call bell/phone within reach;with family/visitor present ?  ?PT Visit Diagnosis: Other abnormalities of gait and mobility (R26.89) ?  ? ? ?Time: 8916-9450 ?PT Time Calculation (min) (ACUTE ONLY): 40 min ? ?Charges:  $Gait Training: 23-37 mins ?$Therapeutic Exercise: 8-22 mins          ?          ? ?Magda Kiel, PT ?Acute Rehabilitation Services ?[place]:[phone] ?[phone] ?01/30/2022 ? ? ? ?[name] ?01/30/2022, 5:34 PM ? ?

## 2022-01-30 NOTE — Progress Notes (Signed)
?                                  PROGRESS NOTE                                             ?                                                                                                                     ?                                         ? ? Patient Demographics:  ? ? Mackenzie Madden, is a 88 y.o. female, DOB - 01-18-1928, OIZ:[id number] ? ?Outpatient Primary MD for the patient is Lavone Orn, MD    LOS - 6  Admit date - 01/23/2022   ? ?Chief Complaint  ?Patient presents with  ? Fall  ?    ? ?Brief Narrative (HPI from H&P)   88 y.o. female with medical history significant of  hypertension, hypothyroidism was transferred from [facility] after experiencing a ground-level fall and was noted to have left wrist fracture.  Patient had been having vertigo or the last 2 weeks.  She was admitted for her left wrist fracture repair. ? ? Subjective:  ? ?Patient in bed, appears comfortable, denies any headache, no fever, no chest pain or pressure, no shortness of breath , no abdominal pain. No new focal weakness. ? ? Assessment  & Plan :  ? ? ?Vertigo related fall with left distal radius open fracture.  S/p surgical fixation by hand surgeon Dr. Greta Doom on 01/25/2022, left arm in splint, seems to have tolerated the procedure well, elderly patient who lives with her husband cannot care for herself right now.  PT OT and likely will require placement as well >> SNF/CIR. NWB L. hand.  Clinically stable await bed. ? ? 2. Ongoing vertigo for several weeks.  Could be due to BPV, CT and MRI brain nonacute.  Add meclizine . ? ?3.  Essential hypertension.  On Coreg. ? ?4.  Hypothyroidism.  On Synthroid. ? ?5.  Cephalosporin sensitive E. Coli UTI present on admission.  Keflex x 5 days.  Has finished her course. ? ?6.  Asymptomatic transaminitis.  Non specific [place], resolved, stable TB and INR. ? ?7.  Hyponatremia due to SIADH.  Stopped fluids , improved. ? ?   ? ?Condition - Fair ? ?Family  Communication  :  ? ?Husband bedside on 01/25/2022, 01/30/2022  ? ?daughter bedside 01/26/22, 01/29/22 ? ?Code Status :  Full ? ?Consults  :  Hand surgery ? ?PUD Prophylaxis :  ? ? Procedures  :    ? ?Right upper  quadrant ultrasound - Mild CBD dilation, post cholecystectomy ? ?CT head and MRI brain.  Nonacute. ? ?ORIF left wrist by hand surgery on 01/25/2019 - Dr Greta Doom ? ?LEFT distal radius open reduction internal fixation, INTRA- articular, 3 fragments ?LEFT  ?3.  LEFT wrist brachioradialis tenotomy  ?4.  LEFT wrist radiographs four views with intraoperative interpretation ? ?   ? ?Disposition Plan  :   ? ?Status is: Inpatient ? ?DVT Prophylaxis  :  Heparin 01/25/22 ? ?heparin injection 5,000 Units Start: 01/25/22 1400 ?Place and maintain sequential compression device Start: 01/24/22 1554 ? ?Lab Results  ?Component Value Date  ? PLT 142 (L) 01/28/2022  ? ? ?Diet :  ?Diet Order   ? ?       ?  Diet regular Room service appropriate? Yes; Fluid consistency: Thin  Diet effective now       ?  ? ?  ?  ? ?  ?  ? ?Inpatient Medications ? ?Scheduled Meds: ? calcium carbonate  1,250 mg Oral Q breakfast  ? docusate sodium  100 mg Oral BID  ? dorzolamide-timolol  1 drop Both Eyes BID  ? heparin injection (subcutaneous)  5,000 Units Subcutaneous Q8H  ? latanoprost  1 drop Both Eyes QHS  ? levothyroxine  88 mcg Oral QAC breakfast  ? meclizine  12.5 mg Oral TID  ? multivitamin with minerals  1 tablet Oral Daily  ? ?Continuous Infusions: ? ? ?PRN Meds:.acetaminophen **OR** acetaminophen, hydrALAZINE, HYDROcodone-acetaminophen, ondansetron **OR** ondansetron (ZOFRAN) IV, polyethylene glycol ? ? ? Time Spent in minutes  30 ? ? ?Lala Lund M.D on 01/30/2022 at 8:39 AM ? ?To page go to [email or web address]  ? ?[facility] -  Office  [phone] ? ?See all Orders from today for further details ? ? ? Objective:  ? ?Vitals:  ? 01/30/22 0000 01/30/22 0400 01/30/22 0500 01/30/22 0755  ?BP: 135/64 134/60  (!) 145/77  ?Pulse: 74 72  78  ?Resp:  '18 14  19  '$ ?Temp:  97.8 ?F (36.6 ?C)  97.7 ?F (36.5 ?C)  ?TempSrc:  Oral  Oral  ?SpO2: 93% 93%  93%  ?Weight:   50.5 kg   ?Height:      ? ? ?Wt Readings from Last 3 Encounters:  ?01/30/22 50.5 kg  ?01/02/22 45.4 kg  ?08/05/19 44.9 kg  ? ? ? ?Intake/Output Summary (Last 24 hours) at 01/30/2022 0839 ?Last data filed at 01/29/2022 1900 ?Gross per 24 hour  ?Intake --  ?Output 850 ml  ?Net -850 ml  ? ? ? ? ?Physical Exam ? ?Awake Alert, No new F.N deficits, ++ bilat.hearing loss ?[place].AT,PERRAL ?Supple Neck, No JVD,   ?Symmetrical Chest wall movement, Good air movement bilaterally, CTAB ?RRR,No Gallops, Rubs or new Murmurs,  ?+ve B.Sounds, Abd Soft, No tenderness,   ?Left arm under bandage , edema almost completely resolved  ? ? Data Review:  ? ? ?CBC ?Recent Labs  ?Lab 01/23/22 ?1906 01/25/22 ?0101 01/26/22 ?0100 01/27/22 ?0331 01/28/22 ?0101  ?WBC 9.0 9.8 8.7 10.1 10.2  ?HGB 13.2 11.9* 10.1* 10.5* 10.2*  ?HCT 40.2 36.2 30.1* 30.7* 29.5*  ?PLT 145* 126* 119* 126* 142*  ?MCV 95.9 98.4 95.3 95.3 94.9  ?MCH 31.5 32.3 32.0 32.6 32.8  ?MCHC 32.8 32.9 33.6 34.2 34.6  ?RDW 13.9 13.6 13.4 13.6 13.7  ?LYMPHSABS  --   --  2.1 2.5 3.1  ?MONOABS  --   --  1.4* 1.3* 1.4*  ?EOSABS  --   --  0.1 0.3  0.4  ?BASOSABS  --   --  0.0 0.0 0.1  ? ? ?Electrolytes ?Recent Labs  ?Lab 01/23/22 ?1906 01/25/22 ?0101 01/26/22 ?0100 01/27/22 ?0331 01/28/22 ?0101  ?NA 132* 132* 132* 130* 130*  ?K 4.8 3.7 3.1* 5.1 4.0  ?CL 99 104 105 106 100  ?CO2 26 21* 23 21* 24  ?GLUCOSE 130* 142* 106* 102* 96  ?BUN '12 11 8 8 9  '$ ?CREATININE 0.82 0.83 0.66 0.71 0.80  ?CALCIUM 9.8 8.0* 7.7* 8.3* 8.4*  ?AST  --  277* 67* 68*  --   ?ALT  --  93* 39 49*  --   ?ALKPHOS  --  139* 108 156*  --   ?BILITOT  --  1.2 0.7 0.6  --   ?ALBUMIN  --  2.6* 2.2* 2.3*  --   ?MG  --   --  1.5* 2.1 1.8  ?INR  --  1.2  --   --   --   ?BNP  --   --  630.4* 380.9*  --   ? ? ?  ?Radiology Reports ?No results found.  ? ? ?

## 2022-01-31 ENCOUNTER — Other Ambulatory Visit: Payer: Self-pay

## 2022-01-31 ENCOUNTER — Inpatient Hospital Stay (HOSPITAL_COMMUNITY)
Admission: RE | Admit: 2022-01-31 | Discharge: 2022-02-08 | DRG: 560 | Disposition: A | Discharge status: Home Health | Payer: Medicare Other | Source: Intra-hospital | Attending: Physical Medicine and Rehabilitation | Admitting: Physical Medicine and Rehabilitation

## 2022-01-31 ENCOUNTER — Encounter (HOSPITAL_COMMUNITY): Payer: Self-pay | Admitting: Physical Medicine and Rehabilitation

## 2022-01-31 DIAGNOSIS — H409 Unspecified glaucoma: Secondary | ICD-10-CM | POA: Diagnosis present

## 2022-01-31 DIAGNOSIS — N390 Urinary tract infection, site not specified: Secondary | ICD-10-CM | POA: Diagnosis not present

## 2022-01-31 DIAGNOSIS — R269 Unspecified abnormalities of gait and mobility: Secondary | ICD-10-CM | POA: Diagnosis present

## 2022-01-31 DIAGNOSIS — S52509A Unspecified fracture of the lower end of unspecified radius, initial encounter for closed fracture: Secondary | ICD-10-CM | POA: Diagnosis present

## 2022-01-31 DIAGNOSIS — S52502S Unspecified fracture of the lower end of left radius, sequela: Secondary | ICD-10-CM | POA: Diagnosis not present

## 2022-01-31 DIAGNOSIS — E039 Hypothyroidism, unspecified: Secondary | ICD-10-CM | POA: Diagnosis present

## 2022-01-31 DIAGNOSIS — K5900 Constipation, unspecified: Secondary | ICD-10-CM | POA: Diagnosis present

## 2022-01-31 DIAGNOSIS — Z8249 Family history of ischemic heart disease and other diseases of the circulatory system: Secondary | ICD-10-CM

## 2022-01-31 DIAGNOSIS — Z9049 Acquired absence of other specified parts of digestive tract: Secondary | ICD-10-CM

## 2022-01-31 DIAGNOSIS — Z9081 Acquired absence of spleen: Secondary | ICD-10-CM

## 2022-01-31 DIAGNOSIS — M25532 Pain in left wrist: Secondary | ICD-10-CM | POA: Diagnosis not present

## 2022-01-31 DIAGNOSIS — Z87440 Personal history of urinary (tract) infections: Secondary | ICD-10-CM | POA: Diagnosis not present

## 2022-01-31 DIAGNOSIS — Z8051 Family history of malignant neoplasm of kidney: Secondary | ICD-10-CM

## 2022-01-31 DIAGNOSIS — Z90710 Acquired absence of both cervix and uterus: Secondary | ICD-10-CM | POA: Diagnosis not present

## 2022-01-31 DIAGNOSIS — S52502A Unspecified fracture of the lower end of left radius, initial encounter for closed fracture: Secondary | ICD-10-CM | POA: Diagnosis not present

## 2022-01-31 DIAGNOSIS — R7401 Elevation of levels of liver transaminase levels: Secondary | ICD-10-CM | POA: Diagnosis present

## 2022-01-31 DIAGNOSIS — Z7409 Other reduced mobility: Secondary | ICD-10-CM | POA: Diagnosis not present

## 2022-01-31 DIAGNOSIS — S52579D Other intraarticular fracture of lower end of unspecified radius, subsequent encounter for closed fracture with routine healing: Secondary | ICD-10-CM | POA: Diagnosis present

## 2022-01-31 DIAGNOSIS — S52602D Unspecified fracture of lower end of left ulna, subsequent encounter for closed fracture with routine healing: Secondary | ICD-10-CM

## 2022-01-31 DIAGNOSIS — Z79899 Other long term (current) drug therapy: Secondary | ICD-10-CM

## 2022-01-31 DIAGNOSIS — H9190 Unspecified hearing loss, unspecified ear: Secondary | ICD-10-CM | POA: Diagnosis present

## 2022-01-31 DIAGNOSIS — W1830XD Fall on same level, unspecified, subsequent encounter: Secondary | ICD-10-CM | POA: Diagnosis not present

## 2022-01-31 DIAGNOSIS — B9620 Unspecified Escherichia coli [E. coli] as the cause of diseases classified elsewhere: Secondary | ICD-10-CM | POA: Diagnosis not present

## 2022-01-31 DIAGNOSIS — I10 Essential (primary) hypertension: Secondary | ICD-10-CM | POA: Diagnosis present

## 2022-01-31 DIAGNOSIS — Z79890 Hormone replacement therapy (postmenopausal): Secondary | ICD-10-CM | POA: Diagnosis not present

## 2022-01-31 DIAGNOSIS — S52501A Unspecified fracture of the lower end of right radius, initial encounter for closed fracture: Secondary | ICD-10-CM

## 2022-01-31 DIAGNOSIS — R42 Dizziness and giddiness: Secondary | ICD-10-CM | POA: Diagnosis present

## 2022-01-31 DIAGNOSIS — K5901 Slow transit constipation: Secondary | ICD-10-CM | POA: Diagnosis not present

## 2022-01-31 DIAGNOSIS — S52572D Other intraarticular fracture of lower end of left radius, subsequent encounter for closed fracture with routine healing: Principal | ICD-10-CM

## 2022-01-31 LAB — CBC
HCT: 33.2 % — ABNORMAL LOW (ref 36.0–46.0)
Hemoglobin: 11.2 g/dL — ABNORMAL LOW (ref 12.0–15.0)
MCH: 32.6 pg (ref 26.0–34.0)
MCHC: 33.7 g/dL (ref 30.0–36.0)
MCV: 96.5 fL (ref 80.0–100.0)
Platelets: 208 10*3/uL (ref 150–400)
RBC: 3.44 MIL/uL — ABNORMAL LOW (ref 3.87–5.11)
RDW: 14.2 % (ref 11.5–15.5)
WBC: 11.7 10*3/uL — ABNORMAL HIGH (ref 4.0–10.5)
nRBC: 0 % (ref 0.0–0.2)

## 2022-01-31 LAB — CREATININE, SERUM
Creatinine, Ser: 0.75 mg/dL (ref 0.44–1.00)
GFR, Estimated: >60 mL/min (ref >60)

## 2022-01-31 MED ORDER — ONDANSETRON HCL 4 MG PO TABS: 4.0000 mg | ORAL_TABLET | Freq: Four times a day (QID) | ORAL | Status: DC | PRN

## 2022-01-31 MED ORDER — BISACODYL 10 MG RE SUPP: 10.0000 mg | Freq: Every day | RECTAL | 0 refills | Status: DC | PRN

## 2022-01-31 MED ORDER — POLYETHYLENE GLYCOL 3350 17 G PO PACK
17.0000 g | PACK | Freq: Every day | ORAL | Status: DC
  Administered 2022-02-01 – 2022-02-04 (×3): 17 g via ORAL
  Filled 2022-01-31 (×7): qty 1

## 2022-01-31 MED ORDER — POLYETHYLENE GLYCOL 3350 17 G PO PACK: 17.0000 g | PACK | Freq: Every day | ORAL | 0 refills | Status: DC

## 2022-01-31 MED ORDER — LEVOTHYROXINE SODIUM 88 MCG PO TABS
88.0000 ug | ORAL_TABLET | Freq: Every day | ORAL | Status: DC
  Administered 2022-02-01 – 2022-02-08 (×8): 88 ug via ORAL
  Filled 2022-01-31 (×8): qty 1

## 2022-01-31 MED ORDER — BISACODYL 10 MG RE SUPP
10.0000 mg | Freq: Once | RECTAL | Status: DC
  Filled 2022-01-31: qty 1

## 2022-01-31 MED ORDER — ADULT MULTIVITAMIN W/MINERALS CH
1.0000 | ORAL_TABLET | Freq: Every day | ORAL | Status: DC
  Administered 2022-02-01 – 2022-02-08 (×8): 1 via ORAL
  Filled 2022-01-31 (×8): qty 1

## 2022-01-31 MED ORDER — AMLODIPINE BESYLATE 5 MG PO TABS
5.0000 mg | ORAL_TABLET | Freq: Every day | ORAL | Status: DC
  Administered 2022-02-01 – 2022-02-06 (×6): 5 mg via ORAL
  Filled 2022-01-31 (×6): qty 1

## 2022-01-31 MED ORDER — ACETAMINOPHEN 325 MG PO TABS
650.0000 mg | ORAL_TABLET | Freq: Four times a day (QID) | ORAL | Status: DC | PRN
  Administered 2022-02-01 – 2022-02-07 (×5): 650 mg via ORAL
  Filled 2022-01-31 (×6): qty 2

## 2022-01-31 MED ORDER — DORZOLAMIDE HCL-TIMOLOL MAL 2-0.5 % OP SOLN
1.0000 [drp] | Freq: Two times a day (BID) | OPHTHALMIC | Status: DC
  Administered 2022-01-31 – 2022-02-08 (×16): 1 [drp] via OPHTHALMIC
  Filled 2022-01-31: qty 10

## 2022-01-31 MED ORDER — SORBITOL 70 % SOLN: 15.0000 mL | Freq: Once | Status: DC

## 2022-01-31 MED ORDER — LATANOPROST 0.005 % OP SOLN
1.0000 [drp] | Freq: Every day | OPHTHALMIC | Status: DC
  Administered 2022-01-31 – 2022-02-07 (×8): 1 [drp] via OPHTHALMIC
  Filled 2022-01-31: qty 2.5

## 2022-01-31 MED ORDER — HEPARIN SODIUM (PORCINE) 5000 UNIT/ML IJ SOLN
5000.0000 [IU] | Freq: Three times a day (TID) | INTRAMUSCULAR | Status: DC
  Administered 2022-01-31 – 2022-02-02 (×5): 5000 [IU] via SUBCUTANEOUS
  Filled 2022-01-31 (×5): qty 1

## 2022-01-31 MED ORDER — HEPARIN SODIUM (PORCINE) 5000 UNIT/ML IJ SOLN: 5000.0000 [IU] | Freq: Three times a day (TID) | INTRAMUSCULAR | Status: DC

## 2022-01-31 MED ORDER — POLYETHYLENE GLYCOL 3350 17 G PO PACK: 17.0000 g | PACK | Freq: Every day | ORAL | Status: DC

## 2022-01-31 MED ORDER — TRAMADOL HCL 50 MG PO TABS
50.0000 mg | ORAL_TABLET | Freq: Four times a day (QID) | ORAL | Status: DC | PRN
Start: 2022-01-31 — End: 2022-01-31

## 2022-01-31 MED ORDER — MAGNESIUM HYDROXIDE 400 MG/5ML PO SUSP
30.0000 mL | Freq: Two times a day (BID) | ORAL | Status: DC
  Administered 2022-01-31: 30 mL via ORAL
  Filled 2022-01-31: qty 30

## 2022-01-31 MED ORDER — ONDANSETRON HCL 4 MG/2ML IJ SOLN: 4.0000 mg | Freq: Four times a day (QID) | INTRAMUSCULAR | Status: DC | PRN

## 2022-01-31 MED ORDER — AMLODIPINE BESYLATE 5 MG PO TABS
5.0000 mg | ORAL_TABLET | Freq: Every day | ORAL | Status: DC
  Administered 2022-01-31: 5 mg via ORAL
  Filled 2022-01-31: qty 1

## 2022-01-31 MED ORDER — AMLODIPINE BESYLATE 5 MG PO TABS: 5.0000 mg | ORAL_TABLET | Freq: Every day | ORAL | Status: DC

## 2022-01-31 MED ORDER — DOCUSATE SODIUM 100 MG PO CAPS
100.0000 mg | ORAL_CAPSULE | Freq: Two times a day (BID) | ORAL | Status: DC
Start: 2022-01-31 — End: 2022-02-02
  Administered 2022-01-31 – 2022-02-02 (×4): 100 mg via ORAL
  Filled 2022-01-31 (×4): qty 1

## 2022-01-31 MED ORDER — CALCIUM CARBONATE 1250 (500 CA) MG PO TABS
1250.0000 mg | ORAL_TABLET | Freq: Every day | ORAL | Status: DC
  Administered 2022-02-01 – 2022-02-08 (×8): 1250 mg via ORAL
  Filled 2022-01-31 (×8): qty 1

## 2022-01-31 MED ORDER — ACETAMINOPHEN 650 MG RE SUPP: 650.0000 mg | Freq: Four times a day (QID) | RECTAL | Status: DC | PRN

## 2022-01-31 MED ORDER — TRAMADOL HCL 50 MG PO TABS: 50.0000 mg | ORAL_TABLET | Freq: Four times a day (QID) | ORAL | Status: DC | PRN

## 2022-01-31 MED ORDER — MECLIZINE HCL 25 MG PO TABS
12.5000 mg | ORAL_TABLET | Freq: Three times a day (TID) | ORAL | Status: DC
  Administered 2022-01-31 – 2022-02-03 (×8): 12.5 mg via ORAL
  Filled 2022-01-31 (×8): qty 1

## 2022-01-31 NOTE — Progress Notes (Signed)
When asking pt if she wished to be dead, she reported yes but just " wished she wasn't going to be a burden"  Pt has no plan or intention of self harm. Was just a thought.  ? ?

## 2022-01-31 NOTE — H&P (Signed)
? ? ?Physical Medicine and Rehabilitation Admission H&P ? ?  ?Chief Complaint  ?Patient presents with  ? Fall  ?: ?HPI: Mackenzie Madden is a 88 year old right-handed female with history of hypertension, hypothyroidism, glaucoma and recently treated for UTI.  Per chart review patient lives with spouse.  1 level home with ramped entrance.  Walks with a rollator.  Independent with dressing and toileting.  She can do light ADLs.  Spouse assist with showers.  Presented 01/23/2022 after ground-level fall without loss of conscious.  Family had reported some bouts of vertigo over the last 2 weeks.  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  X-rays of the left wrist show comminuted displaced and angulated distal radius fracture with intra-articular extension.  Large amount of air in the soft tissues.  Displaced angulated distal ulna fracture.  Admission chemistries unremarkable Sub sodium 132 glucose 130, urine culture greater 100,000 E. coli.  Patient underwent left distal ORIF/I&D as well as distal ulnar open reduction internal fixation 01/24/2021 per Dr. Orene Desanctis.  Nonweightbearing left upper extremity.  Subcutaneous heparin for DVT prophylaxis.  She completed a course of 5 days of Keflex for UTI.  Mildly elevated LFTs with nonspecific ultrasound resolved advised follow-up chemistries.  Therapy evaluations completed due to patient decreased functional mobility was admitted for a comprehensive rehab program. ? ?Pt reports having small BM's but feels very constipated.  ? Had L hand swelling, but is somewhat better.  ?Pain is OK except intermittently, or if moves "wrong"- last pain med yesterday afternoon.  ?Voiding OK.  ? ?Review of Systems  ?Constitutional:  Negative for chills and fever.  ?HENT:  Positive for hearing loss.   ?Eyes:  Negative for double vision.  ?Respiratory:  Negative for cough and  shortness of breath.   ?Cardiovascular:  Negative for chest pain, palpitations and leg swelling.  ?Gastrointestinal:  Positive for constipation. Negative for heartburn, nausea and vomiting.  ?Genitourinary:  Negative for dysuria, flank pain and hematuria.  ?Musculoskeletal:  Positive for joint pain and myalgias.  ?Skin:  Negative for rash.  ?Neurological:   ?     Bouts of vertigo  ?All other systems reviewed and are negative. ?Past Medical History:  ?Diagnosis Date  ? Glaucoma   ? Glaucoma   ? HOH (hard of hearing)   ? no hearing aids  ? Hypertension   ? Hypothyroidism   ? Thyroid disease   ? ?Past Surgical History:  ?Procedure Laterality Date  ? ABDOMINAL HYSTERECTOMY  2010  ? CHOLECYSTECTOMY  2000  ? CLOSED REDUCTION WRIST FRACTURE Left 01/24/2022  ? Procedure: CLOSED REDUCTION WRIST;  Surgeon: Orene Desanctis, MD;  Location: [place];  Service: Orthopedics;  Laterality: Left;  ? HIP PINNING,CANNULATED Right 08/05/2019  ? Procedure: CANNULATED HIP PINNING;  Surgeon: Mcarthur Rossetti, MD;  Location: [facility];  Service: Orthopedics;  Laterality: Right;  ? I & D EXTREMITY Left 01/24/2022  ? Procedure: IRRIGATION AND DEBRIDEMENT EXTREMITY;LEFT distal radius open reduction internal fixation, INTRA- articular, 3 fragments;  Surgeon: Orene Desanctis, MD;  Location: [place];  Service: Orthopedics;  Laterality: Left;  ? ORIF WRIST FRACTURE Left 01/24/2022  ? Procedure: OPEN REDUCTION INTERNA FIXATION OF LEFT WRIST FRACTURE; LEFT wrist radiographs four views with intraoperative interpretation;  Surgeon: Orene Desanctis, MD;  Location: [place];  Service: Orthopedics;  Laterality: Left;  ? SPLENECTOMY, TOTAL    ? ?Family History  ?Problem Relation Age  of Onset  ? CAD Mother   ? Kidney cancer Mother   ? Heart attack Father   ? Colon cancer Neg Hx   ? Esophageal cancer Neg Hx   ? Stomach cancer Neg Hx   ? Rectal cancer Neg Hx   ? ?Social History:  reports that she has never smoked. She has never used smokeless tobacco. She reports that she  does not drink alcohol and does not use drugs. ?Allergies: No Known Allergies ?Medications Prior to Admission  ?Medication Sig Dispense Refill  ? Calcium 250 MG CAPS Take 250 mg by mouth daily.    ? CRANBERRY PO Take 1 capsule by mouth daily.    ? dorzolamide-timolol (COSOPT) 22.3-6.8 MG/ML ophthalmic solution Place 1 drop into both eyes 2 (two) times daily.    ? latanoprost (XALATAN) 0.005 % ophthalmic solution Place 1 drop into both eyes at bedtime.  11  ? levothyroxine (SYNTHROID) 88 MCG tablet Take 88 mcg by mouth daily before breakfast.     ? Multiple Vitamin (MULTIVITAMIN WITH MINERALS) TABS tablet Take 1 tablet by mouth daily.    ? naproxen sodium (ALEVE) 220 MG tablet Take 220 mg by mouth daily as needed (pain).    ? carvedilol (COREG) 3.125 MG tablet Take 1 tablet (3.125 mg total) by mouth 2 (two) times daily with a meal. (Patient not taking: Reported on 01/24/2022) 30 tablet 1  ? cephALEXin (KEFLEX) 500 MG capsule Take 1 capsule (500 mg total) by mouth 2 (two) times daily. (Patient not taking: Reported on 01/24/2022) 14 capsule 0  ? ? ? ? ?Home: ?Home Living ?Family/patient expects to be discharged to:: Private residence ?Living Arrangements: Spouse/significant other ?Available Help at Discharge: Family, Available 24 hours/day ?Type of Home: House ?Home Access: Ramped entrance ?Home Layout: One level ?Bathroom Shower/Tub: Walk-in shower ?Bathroom Toilet: Handicapped height ?Bathroom Accessibility: Yes ?Home Equipment: Rollator (4 wheels), Shower seat, BSC/3in1 ?  ?Functional History: ?Prior Function ?Prior Level of Function : Needs assist ?Mobility Comments: Walks with rollators; goes up/down driveway ?ADLs Comments: Pt independent with dressing and toileting, can do light IADLs; spouse assist with showers (pt sits on shower chair and he helps with back and legs) ? ?Functional Status:  ?Mobility: ?Bed Mobility ?Overal bed mobility: Needs Assistance ?Bed Mobility: Supine to Sit ?Supine to sit: Mod assist ?Sit  to supine: Mod assist ?General bed mobility comments: up in recliner ?Transfers ?Overall transfer level: Needs assistance ?Equipment used: Hemi-walker ?Transfers: Sit to/from Stand ?Sit to Stand: Min assist ?Bed to/from chair/wheelchair/BSC transfer type:: Step pivot ?Step pivot transfers: Mod assist ?General transfer comment: up from recliner with armrest on R and cues ?Ambulation/Gait ?Ambulation/Gait assistance: Min assist, Mod assist ?Gait Distance (Feet): 40 Feet ?Assistive device: Hemi-walker ?Gait Pattern/deviations: Step-to pattern, Decreased stride length, Trunk flexed, Shuffle, Narrow base of support ?General Gait Details: cues throughout for posture, forward gaze, walker management and sequencing steps; assist throughout for balance ?Gait velocity: decreased ?Gait velocity interpretation: <1.31 ft/sec, indicative of household ambulator ?  ? ?ADL: ?ADL ?Overall ADL's : Needs assistance/impaired ?Eating/Feeding: Set up, Sitting ?Grooming: Wash/dry hands, Wash/dry face, Minimal assistance, Sitting ?Upper Body Bathing: Moderate assistance, Sitting ?Lower Body Bathing: Maximal assistance, Sit to/from stand ?Upper Body Dressing : Maximal assistance, Sitting ?Upper Body Dressing Details (indicate cue type and reason): sling only ?Lower Body Dressing: Maximal assistance, Sit to/from stand ?Lower Body Dressing Details (indicate cue type and reason): donning incontinence brief ?Toilet Transfer: Moderate assistance, Stand-pivot ?Toileting- Clothing Manipulation and Hygiene: Sit to/from stand, Maximal  assistance ?Functional mobility during ADLs: Moderate assistance ?General ADL Comments: Therapist re-wrapped ace bandage around splint as nursing had wrapped with gauze since no ace bandages were found on the unit.  Mod assist for transfer to the EOB with max assist for threading her brief and standing to pull it up over her hips.  Noted bladder incontinence with sit to stand as well. ? ?Cognition: ?Cognition ?Overall  Cognitive Status: Within Functional Limits for tasks assessed ?Orientation Level: Oriented X4 ?Cognition ?Arousal/Alertness: Awake/alert ?Behavior During Therapy: [facility] for tasks assessed/performed ?Overall

## 2022-01-31 NOTE — Discharge Summary (Signed)
?                                                                                ? ?Mackenzie Madden RWE:[id number] DOB: 1927/11/21 DOA: 01/23/2022 ? ?PCP: Lavone Orn, MD ? ?Admit date: 01/23/2022  Discharge date: 01/31/2022 ? ?Admitted From: Home   Disposition:  CIR ? ? ?Recommendations for Outpatient Follow-up:  ? ?Follow up with PCP in 1-2 weeks ? ?PCP Please obtain BMP/CBC, 2 view CXR in 1week,  (see Discharge instructions)  ? ?PCP Please follow up on the following pending results:  ? ? ?Home Health: None   ?Equipment/Devices: None  ?Consultations: Orthopedics ?Discharge Condition: Stable    ?CODE STATUS: Full    ?Diet Recommendation: Heart Healthy  ? ?Diet Order   ? ?       ?  Diet - low sodium heart healthy       ?  ?  Diet regular Room service appropriate? Yes; Fluid consistency: Thin  Diet effective now       ?  ? ?  ?  ? ?  ?  ? ?Chief Complaint  ?Patient presents with  ? Fall  ?  ? ?Brief history of present illness from the day of admission and additional interim summary   ? ?88 y.o. female with medical history significant of  hypertension, hypothyroidism was transferred from [facility] ED after experiencing a ground-level fall and was noted to have left wrist fracture.  Patient had been having vertigo or the last 2 weeks.  She was admitted for her left wrist fracture repair. ? ? ?                                                               Hospital Course  ? ? ?Vertigo related fall with left distal radius open fracture.  S/p surgical fixation by hand surgeon Dr. Greta Doom on 01/25/2022, she did olerate the procedure well, elderly patient who lives with her husband and cannot care for herself right now.  Continue with PT OT and likely will require placement as well >> SNF/CIR. note according to the daughter she has had poor balance over several months to years when she holds onto house furniture to ambulate, may  require long-term placement evaluation as well ?NWB L. hand, keep left hand propped up with 1-2 pillows at all times, must follow-up with orthopedic surgeon Dr. Greta Doom within a week of discharge.  ?  ? ? 2.  Fall and vertigo/chronic poor balance.  Kindly see above ?  ?3.  Essential hypertension.  Placed on Norvasc ?  ?4.  Hypothyroidism.  On Synthroid. ?  ?5.  Cephalosporin sensitive E. Coli UTI present on admission.  Keflex x 5 days.  Has finished her course. ?  ?6.  Asymptomatic transaminitis.  Non specific [place], resolved, stable TB and INR. ?  ?7.  Hyponatremia due to SIADH.  Stopped fluids , improved. ?  ?8.  Constipation.  Placed on bowel regimen. ? ?  Discharge diagnosis   ? ? ?Principal Problem: ?  Open wrist fracture, left, initial encounter ?Active Problems: ?  HTN (hypertension) ?  Hypothyroid ?  Fall ?  Vertigo ? ? ? ?Discharge instructions   ? ?Discharge Instructions   ? ? Diet - low sodium heart healthy   Complete by: As directed ?  ? Discharge instructions   Complete by: As directed ?  ? Follow with Primary MD Lavone Orn, MD in 7 days  ? ?Get CBC, CMP, magnesium-  checked next visit within 1 week by CIR MD   ? ?Activity: No weightbearing left arm, keep left arm in sling, ambulation as tolerated with Full fall precautions use walker/cane & assistance as needed ? ?Disposition CIR ? ?Diet: Heart Healthy   ? ?Special Instructions: If you have smoked or chewed Tobacco  in the last 2 yrs please stop smoking, stop any regular Alcohol  and or any Recreational drug use. ? ?On your next visit with your primary care physician please Get Medicines reviewed and adjusted. ? ?Please request your Prim.MD to go over all Hospital Tests and Procedure/Radiological results at the follow up, please get all Hospital records sent to your Prim MD by signing hospital release before you go home. ? ?If you experience worsening of your admission symptoms, develop shortness of breath, life threatening emergency, suicidal or  homicidal thoughts you must seek medical attention immediately by calling 911 or calling your MD immediately  if symptoms less severe. ? ?You Must read complete instructions/literature along with all the possible adverse reactions/side effects for all the Medicines you take and that have been prescribed to you. Take any new Medicines after you have completely understood and accpet all the possible adverse reactions/side effects.  ? Discharge wound care:   Complete by: As directed ?  ? Left hand dressing change per orthopedics within a week of discharge in the office  ? Increase activity slowly   Complete by: As directed ?  ? ?  ? ? ?Discharge Medications  ? ?Allergies as of 01/31/2022   ?No Known Allergies ?  ? ?  ?Medication List  ?  ? ?STOP taking these medications   ? ?carvedilol 3.125 MG tablet ?Commonly known as: COREG ?  ?cephALEXin 500 MG capsule ?Commonly known as: KEFLEX ?  ? ?  ? ?TAKE these medications   ? ?amLODipine 5 MG tablet ?Commonly known as: NORVASC ?Take 1 tablet (5 mg total) by mouth daily. ?Start taking on: Feb 01, 2022 ?  ?bisacodyl 10 MG suppository ?Commonly known as: DULCOLAX ?Place 1 suppository (10 mg total) rectally daily as needed for moderate constipation. ?  ?Calcium 250 MG Caps ?Take 250 mg by mouth daily. ?  ?CRANBERRY PO ?Take 1 capsule by mouth daily. ?  ?dorzolamide-timolol 22.3-6.8 MG/ML ophthalmic solution ?Commonly known as: COSOPT ?Place 1 drop into both eyes 2 (two) times daily. ?  ?latanoprost 0.005 % ophthalmic solution ?Commonly known as: XALATAN ?Place 1 drop into both eyes at bedtime. ?  ?levothyroxine 88 MCG tablet ?Commonly known as: SYNTHROID ?Take 88 mcg by mouth daily before breakfast. ?  ?multivitamin with minerals Tabs tablet ?Take 1 tablet by mouth daily. ?  ?naproxen sodium 220 MG tablet ?Commonly known as: ALEVE ?Take 220 mg by mouth daily as needed (pain). ?  ?polyethylene glycol 17 g packet ?Commonly known as: MIRALAX / GLYCOLAX ?Take 17 g by mouth  daily. ?Start taking on: Feb 01, 2022 ?  ? ?  ? ?  ?  ? ? ?  ?  Discharge Care Instructions  ?(From admission, onward)  ?  ? ? ?  ? ?  Start     Ordered  ? 01/31/22 0000  Discharge wound care:       ?Comments: Left hand dressing change per orthopedics within a week of discharge in the office  ? 01/31/22 0953  ? ?  ?  ? ?  ? ? ? Follow-up Information   ? ? Orene Desanctis, MD. Schedule an appointment as soon as possible for a visit in 1 week(s).   ?Specialty: Orthopedic Surgery ?Contact information: ?[address] ?[place] [address] ?[phone] ? ? ?  ?  ? ?  ?  ? ?  ? ? ?Major procedures and Radiology Reports - PLEASE review detailed and final reports thoroughly  -    ? ?  ?DG Chest 2 View ? ?Result Date: 01/02/2022 ?CLINICAL DATA:  Cough, fever EXAM: CHEST - 2 VIEW COMPARISON:  08/04/2019 FINDINGS: Heart and mediastinal contours are within normal limits. No focal opacities or effusions. No acute bony abnormality. Aortic atherosclerosis. IMPRESSION: No active cardiopulmonary disease. Electronically Signed   By: Rolm Baptise M.D.   On: 01/02/2022 23:29  ? ?DG Wrist 2 Views Left ? ?Result Date: 01/23/2022 ?CLINICAL DATA:  Fall while ambulating with walker at home. Left wrist swelling and deformity. EXAM: LEFT WRIST - 2 VIEW COMPARISON:  None. FINDINGS: Comminuted and displaced distal radius fracture primarily involving the metaphysis. There is apex radial angulation. Large amount of air in the soft tissues with skin irregularity and possible exposed bone suggesting open fracture. Fracture extends into the distal radioulnar joint. There is an oblique displaced fracture of the distal ulna with distal fracture fragments displaced about the volar aspect. The carpal bones remain aligned with the distal radius fracture fragment. 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 Comminuted, displaced and angulated distal radius fracture with intra-articular extension. Large amount of air in the  soft tissues, skin irregularity with possible exposed bone suggesting open fracture. 2. Displaced angulated distal ulna fracture. Electronically Signed   By: Keith Rake M.D.   On: 01/23/2022 20:49  ? ?DG Wrist Complete R

## 2022-01-31 NOTE — Progress Notes (Signed)
Inpatient Rehabilitation Admission Medication Review by a Pharmacist ? ?A complete drug regimen review was completed for this patient to identify any potential clinically significant medication issues. ? ?High Risk Drug Classes Is patient taking? Indication by Medication  ?Antipsychotic No   ?Anticoagulant Yes SQ heparin: VTE ppx  ?Antibiotic No   ?Opioid Yes Tramadol: PRN pain  ?Antiplatelet No   ?Hypoglycemics/insulin No   ?Vasoactive Medication Yes Amlodipine: hypertension  ?Chemotherapy No   ?Other Yes Acetaminophen: PRN pain ?Docusate, Miralax: constipation ?Calcium carbonate: supplement ?dorzolamide-timolol, latanoprost: glaucoma ?Levothyroxine: hypothyroidism ?Meclizine: vertigo ?MVI: supplement ?Ondansetron: PRN nausea/vomiting  ? ? ? ?Type of Medication Issue Identified Description of Issue Recommendation(s)  ?Drug Interaction(s) (clinically significant) ?    ?Duplicate Therapy ?    ?Allergy ?    ?No Medication Administration End Date ?    ?Incorrect Dose ?    ?Additional Drug Therapy Needed ?    ?Significant med changes from prior encounter (inform family/care partners about these prior to discharge). Carvedilol discontinued during inpatient admission   ?Other ?    ? ? ?Clinically significant medication issues were identified that warrant physician communication and completion of prescribed/recommended actions by midnight of the next day:  No ? ?Pharmacist comments: n/a ? ?Time spent performing this drug regimen review (minutes): 20 ? ? ?Thank you for allowing pharmacy to be a part of this patient?s care. ? ?Ardyth Harps, PharmD ?Clinical Pharmacist ? ?

## 2022-01-31 NOTE — Progress Notes (Signed)
Inpatient Rehab Admissions Coordinator:  ? ?I have a CIR bed for this Pt. And can admit today. RN may call report to [phone]. ? ?Arseniy Toomey, MS, CCC-SLP ?Rehab Admissions Coordinator  ?[phone] (celll) ?[phone] (office) ? ?

## 2022-01-31 NOTE — H&P (Signed)
?  ?Physical Medicine and Rehabilitation Admission H&P ?  ?  ?   ?Chief Complaint  ?Patient presents with  ? Fall  ?: ?HPI: Mackenzie Madden is a 88 year old right-handed female with history of hypertension, hypothyroidism, glaucoma and recently treated for UTI.  Per chart review patient lives with spouse.  1 level home with ramped entrance.  Walks with a rollator.  Independent with dressing and toileting.  She can do light ADLs.  Spouse assist with showers.  Presented 01/23/2022 after ground-level fall without loss of conscious.  Family had reported some bouts of vertigo over the last 2 weeks.  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  X-rays of the left wrist show comminuted displaced and angulated distal radius fracture with intra-articular extension.  Large amount of air in the soft tissues.  Displaced angulated distal ulna fracture.  Admission chemistries unremarkable Sub sodium 132 glucose 130, urine culture greater 100,000 E. coli.  Patient underwent left distal ORIF/I&D as well as distal ulnar open reduction internal fixation 01/24/2021 per Dr. Orene Desanctis.  Nonweightbearing left upper extremity.  Subcutaneous heparin for DVT prophylaxis.  She completed a course of 5 days of Keflex for UTI.  Mildly elevated LFTs with nonspecific ultrasound resolved advised follow-up chemistries.  Therapy evaluations completed due to patient decreased functional mobility was admitted for a comprehensive rehab program. ?  ?Pt reports having small BM's but feels very constipated.  ? Had L hand swelling, but is somewhat better.  ?Pain is OK except intermittently, or if moves "wrong"- last pain med yesterday afternoon.  ?Voiding OK.  ?  ?Review of Systems  ?Constitutional:  Negative for chills and fever.  ?HENT:  Positive for hearing loss.   ?Eyes:  Negative for double vision.  ?Respiratory:  Negative for cough  and shortness of breath.   ?Cardiovascular:  Negative for chest pain, palpitations and leg swelling.  ?Gastrointestinal:  Positive for constipation. Negative for heartburn, nausea and vomiting.  ?Genitourinary:  Negative for dysuria, flank pain and hematuria.  ?Musculoskeletal:  Positive for joint pain and myalgias.  ?Skin:  Negative for rash.  ?Neurological:   ?     Bouts of vertigo  ?All other systems reviewed and are negative. ?    ?Past Medical History:  ?Diagnosis Date  ? Glaucoma    ? Glaucoma    ? HOH (hard of hearing)    ?  no hearing aids  ? Hypertension    ? Hypothyroidism    ? Thyroid disease    ?  ?     ?Past Surgical History:  ?Procedure Laterality Date  ? ABDOMINAL HYSTERECTOMY   2010  ? CHOLECYSTECTOMY   2000  ? CLOSED REDUCTION WRIST FRACTURE Left 01/24/2022  ?  Procedure: CLOSED REDUCTION WRIST;  Surgeon: Orene Desanctis, MD;  Location: [facility];  Service: Orthopedics;  Laterality: Left;  ? HIP PINNING,CANNULATED Right 08/05/2019  ?  Procedure: CANNULATED HIP PINNING;  Surgeon: Mcarthur Rossetti, MD;  Location: [facility];  Service: Orthopedics;  Laterality: Right;  ? I & D EXTREMITY Left 01/24/2022  ?  Procedure: IRRIGATION AND DEBRIDEMENT EXTREMITY;LEFT distal radius open reduction internal fixation, INTRA- articular, 3 fragments;  Surgeon: Orene Desanctis, MD;  Location: [facility];  Service: Orthopedics;  Laterality: Left;  ? ORIF WRIST FRACTURE Left 01/24/2022  ?  Procedure: OPEN REDUCTION INTERNA FIXATION OF LEFT WRIST FRACTURE; LEFT wrist radiographs four views with intraoperative interpretation;  Surgeon: Orene Desanctis, MD;  Location: [facility];  Service: Orthopedics;  Laterality: Left;  ? SPLENECTOMY, TOTAL      ?  ?     ?Family History  ?Problem Relation Age of Onset  ? CAD Mother    ? Kidney cancer Mother    ? Heart attack Father    ? Colon cancer Neg Hx    ? Esophageal cancer Neg Hx    ? Stomach cancer Neg Hx    ? Rectal cancer Neg Hx    ?  ?Social History:  reports that she has never smoked. She has  never used smokeless tobacco. She reports that she does not drink alcohol and does not use drugs. ?Allergies: No Known Allergies ?      ?Medications Prior to Admission  ?Medication Sig Dispense Refill  ? Calcium 250 MG CAPS Take 250 mg by mouth daily.      ? CRANBERRY PO Take 1 capsule by mouth daily.      ? dorzolamide-timolol (COSOPT) 22.3-6.8 MG/ML ophthalmic solution Place 1 drop into both eyes 2 (two) times daily.      ? latanoprost (XALATAN) 0.005 % ophthalmic solution Place 1 drop into both eyes at bedtime.   11  ? levothyroxine (SYNTHROID) 88 MCG tablet Take 88 mcg by mouth daily before breakfast.       ? Multiple Vitamin (MULTIVITAMIN WITH MINERALS) TABS tablet Take 1 tablet by mouth daily.      ? naproxen sodium (ALEVE) 220 MG tablet Take 220 mg by mouth daily as needed (pain).      ? carvedilol (COREG) 3.125 MG tablet Take 1 tablet (3.125 mg total) by mouth 2 (two) times daily with a meal. (Patient not taking: Reported on 01/24/2022) 30 tablet 1  ? cephALEXin (KEFLEX) 500 MG capsule Take 1 capsule (500 mg total) by mouth 2 (two) times daily. (Patient not taking: Reported on 01/24/2022) 14 capsule 0  ?  ?  ?  ?  ?Home: ?Home Living ?Family/patient expects to be discharged to:: Private residence ?Living Arrangements: Spouse/significant other ?Available Help at Discharge: Family, Available 24 hours/day ?Type of Home: House ?Home Access: Ramped entrance ?Home Layout: One level ?Bathroom Shower/Tub: Walk-in shower ?Bathroom Toilet: Handicapped height ?Bathroom Accessibility: Yes ?Home Equipment: Rollator (4 wheels), Shower seat, BSC/3in1 ?  ?Functional History: ?Prior Function ?Prior Level of Function : Needs assist ?Mobility Comments: Walks with rollators; goes up/down driveway ?ADLs Comments: Pt independent with dressing and toileting, can do light IADLs; spouse assist with showers (pt sits on shower chair and he helps with back and legs) ?  ?Functional Status:  ?Mobility: ?Bed Mobility ?Overal bed mobility:  Needs Assistance ?Bed Mobility: Supine to Sit ?Supine to sit: Mod assist ?Sit to supine: Mod assist ?General bed mobility comments: up in recliner ?Transfers ?Overall transfer level: Needs assistance ?Equipment used: Hemi-walker ?Transfers: Sit to/from Stand ?Sit to Stand: Min assist ?Bed to/from chair/wheelchair/BSC transfer type:: Step pivot ?Step pivot transfers: Mod assist ?General transfer comment: up from recliner with armrest on R and cues ?Ambulation/Gait ?Ambulation/Gait assistance: Min assist, Mod assist ?Gait Distance (Feet): 40 Feet ?Assistive device: Hemi-walker ?Gait Pattern/deviations: Step-to pattern, Decreased stride length, Trunk flexed, Shuffle, Narrow base of support ?General Gait Details: cues throughout for posture, forward gaze, walker management and sequencing steps; assist throughout for balance ?Gait velocity: decreased ?Gait velocity interpretation: <1.31 ft/sec, indicative of household ambulator ?  ?ADL: ?ADL ?Overall ADL's : Needs assistance/impaired ?Eating/Feeding: Set up, Sitting ?Grooming: Wash/dry hands, Wash/dry face, Minimal assistance,  Sitting ?Upper Body Bathing: Moderate assistance, Sitting ?Lower Body Bathing: Maximal assistance, Sit to/from stand ?Upper Body Dressing : Maximal assistance, Sitting ?Upper Body Dressing Details (indicate cue type and reason): sling only ?Lower Body Dressing: Maximal assistance, Sit to/from stand ?Lower Body Dressing Details (indicate cue type and reason): donning incontinence brief ?Toilet Transfer: Moderate assistance, Stand-pivot ?Toileting- Clothing Manipulation and Hygiene: Sit to/from stand, Maximal assistance ?Functional mobility during ADLs: Moderate assistance ?General ADL Comments: Therapist re-wrapped ace bandage around splint as nursing had wrapped with gauze since no ace bandages were found on the unit.  Mod assist for transfer to the EOB with max assist for threading her brief and standing to pull it up over her hips.  Noted bladder  incontinence with sit to stand as well. ?  ?Cognition: ?Cognition ?Overall Cognitive Status: Within Functional Limits for tasks assessed ?Orientation Level: Oriented X4 ?Cognition ?Arousal/Alertness: Awake/

## 2022-01-31 NOTE — Progress Notes (Signed)
Pt arrived to unit, pt alert and oriented but very HOH even with both hearing aids in place, no c/o of pain, elevated left extremity, lungs clear, MASD to buttocks. Family at bedside, oriented to rehab.  ?

## 2022-01-31 NOTE — Progress Notes (Signed)
PMR Admission Coordinator Pre-Admission Assessment ?  ?Patient: Mackenzie Madden is an 88 y.o., female ?MRN: [id number] ?DOB: April 25, 1928 ?Height: 5' (152.4 cm) ?Weight: 49.6 kg ?  ?Insurance Information ?HMO:     PPO: yes     PCP:      IPA:      80/20:      OTHER:  ?PRIMARY: U[employer] Medicare      Policy#: [id number]      Subscriber: patient ?CM Name: n/a       Phone#: [phone]     Fax#: [phone] I received a call and fax from [facility] granting approval 01/31/22-02/06/22 with updates due 02/06/22 ?Pre-Cert#: [id number]      Employer:  ?Benefits:  Phone #: [email or web address]     Name:  ?Eff. Date: 10/02/21     Deduct: does not have one    ?Out of Pocket Max: $500      Life Max: NA ?CIR: $25 copay/admission      SNF: 100% coverage ?Outpatient: 100% coverage     Co-Pay:  ?Home Health: 100% coverage      Co-Pay:  ?DME: 80% coverage     Co-Pay: 20% co-insurance ?Providers: in-network ?SECONDARY:       Policy#:      Phone#:  ?  ?Financial Counselor:       Phone#:  ?  ?The ?Data Collection Information Summary? for patients in Inpatient Rehabilitation Facilities with attached ?Privacy Act [facility] Records? was provided and verbally reviewed with: Patient ?  ?Emergency Contact Information ?Contact Information   ?  ?  Name Relation Home Work Mobile  ?  Faulk   [phone]  ?  Mackenzie Madden Daughter     [phone]  ?  ?   ?  ?  ?Current Medical History  ?Patient Admitting Diagnosis: left distal radius and ulna fx, debility ?History of Present Illness: Mackenzie Madden is a 88 year old right-handed female with history of hypertension, hypothyroidism, glaucoma and recently treated for UTI.  Per chart review patient lives with spouse.  1 level home with ramped entrance.  Walks with a rollator.  Independent with dressing and toileting.  She can do light ADLs.  Spouse assist with showers.  Presented 01/23/2022 after ground-level fall without loss of conscious.  Family had reported some  bouts of vertigo over the last 2 weeks.  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  X-rays of the left wrist show comminuted displaced and angulated distal radius fracture with intra-articular extension.  Large amount of air in the soft tissues.  Displaced angulated distal ulna fracture.  Admission chemistries unremarkable Sub sodium 132 glucose 130, urine culture greater 100,000 E. coli.  Patient underwent left distal ORIF/I&D as well as distal ulnar open reduction internal fixation 01/24/2021 per Dr. Orene Desanctis.  Nonweightbearing left upper extremity.  Subcutaneous heparin for DVT prophylaxis.  She completed a course of 5 days of Keflex for UTI.  Mildly elevated LFTs with nonspecific ultrasound resolved advised follow-up chemistries.  Therapy evaluations completed due to patient decreased functional mobility was admitted for a comprehensive rehab  ?  ?Patient's medical record from [facility] has been reviewed by the rehabilitation admission coordinator and physician. ?  ?Past Medical History  ?    ?Past Medical History:  ?Diagnosis Date  ? Glaucoma    ? Glaucoma    ? HOH (hard of hearing)    ?  no hearing aids  ? Hypertension    ? Hypothyroidism    ? Thyroid disease    ?  ?  ?Has the patient had major surgery during 100 days prior to admission? Yes ?  ?Family History   ?family history includes CAD in her mother; Heart attack in her father; Kidney cancer in her mother. ?  ?Current Medications ?  ?Current Facility-Administered Medications:  ?  acetaminophen (TYLENOL) tablet 650 mg, 650 mg, Oral, Q6H PRN, 650 mg at 01/27/22 1626 **OR** acetaminophen (TYLENOL) suppository 650 mg, 650 mg, Rectal, Q6H PRN, Pokhrel, Laxman, MD ?  calcium carbonate (OS-CAL - dosed in mg of elemental calcium) tablet 1,250 mg, 1,250 mg, Oral, Q breakfast, Pokhrel, Laxman, MD, 1,250 mg at 01/29/22 0865 ?  docusate  sodium (COLACE) capsule 100 mg, 100 mg, Oral, BID, Pokhrel, Laxman, MD, 100 mg at 01/29/22 7846 ?  dorzolamide-timolol (COSOPT) 22.3-6.8 MG/ML ophthalmic solution 1 drop, 1 drop, Both Eyes, BID, Pokhrel, Laxman, MD, 1 drop at 01/29/22 0827 ?  heparin injection 5,000 Units, 5,000 Units, Subcutaneous, Q8H, Thurnell Lose, MD, 5,000 Units at 01/29/22 1243 ?  hydrALAZINE (APRESOLINE) injection 10 mg, 10 mg, Intravenous, Q6H PRN, Pokhrel, Laxman, MD, 10 mg at 01/25/22 1836 ?  HYDROcodone-acetaminophen (NORCO/VICODIN) 5-325 MG per tablet 1 tablet, 1 tablet, Oral, Q6H PRN, Thurnell Lose, MD, 1 tablet at 01/28/22 2144 ?  latanoprost (XALATAN) 0.005 % ophthalmic solution 1 drop, 1 drop, Both Eyes, QHS, Pokhrel, Laxman, MD, 1 drop at 01/28/22 2136 ?  levothyroxine (SYNTHROID) tablet 88 mcg, 88 mcg, Oral, QAC breakfast, Pokhrel, Laxman, MD, 88 mcg at 01/29/22 0506 ?  meclizine (ANTIVERT) tablet 12.5 mg, 12.5 mg, Oral, TID, Thurnell Lose, MD, 12.5 mg at 01/29/22 9629 ?  multivitamin with minerals tablet 1 tablet, 1 tablet, Oral, Daily, Pokhrel, Laxman, MD, 1 tablet at 01/29/22 5284 ?  ondansetron (ZOFRAN) tablet 4 mg, 4 mg, Oral, Q6H PRN **OR** ondansetron (ZOFRAN) injection 4 mg, 4 mg, Intravenous, Q6H PRN, Pokhrel, Laxman, MD ?  polyethylene glycol (MIRALAX / GLYCOLAX) packet 17 g, 17 g, Oral, Daily PRN, Pokhrel, Laxman, MD ?  ?Patients Current Diet:  ?Diet Order   ?  ?         ?    Diet regular Room service appropriate? Yes; Fluid consistency: Thin  Diet effective now       ?  ?  ?   ?  ?  ?   ?  ?  ?Precautions / Restrictions ?Precautions ?Precautions: Fall ?Restrictions ?Weight Bearing Restrictions: No ?LUE Weight Bearing: Non weight bearing ?Other Position/Activity Restrictions: NWB hand and elbow per ortho  ?  ?Has the patient had 2 or more falls or a fall with injury in the past year? Yes ?  ?Prior Activity Level ?Limited Community (1-2x/wk): goes to church, medical appointments and walks ?  ?Prior Functional  Level ?Self Care: Did the patient need help bathing, dressing, using the toilet or eating? Needed some help ?  ?Indoor Mobility: Did the patient need assistance with walking from room to room (with or without device)? Independent ?  ?Stairs: Did the patient need assistance with internal or external stairs (with or without device)? Pt avoided stairs ?  ?Functional Cognition: Did the patient need help planning regular tasks such as shopping or remembering to take medications? Independent ?  ?Patient Information ?Are you of Hispanic, Latino/a,or Spanish origin?: A. No, not of Hispanic, Latino/a, or Spanish origin ?What is your race?: A. White ?Do you need  or want an interpreter to communicate with a doctor or health care staff?: 0. No ?  ?Patient's Response To:  ?Health Literacy and Transportation ?Is the patient able to respond to health literacy and transportation needs?: Yes ?Health Literacy - How often do you need to have someone help you when you read instructions, pamphlets, or other written material from your doctor or pharmacy?: Never ?In the past 12 months, has lack of transportation kept you from medical appointments or from getting medications?: No ?In the past 12 months, has lack of transportation kept you from meetings, work, or from getting things needed for daily living?: No ?  ?Home Assistive Devices / Equipment ?Home Equipment: Rollator (4 wheels), Shower seat, BSC/3in1 ?  ?Prior Device Use: Indicate devices/aids used by the patient prior to current illness, exacerbation or injury? Walker ?  ?Current Functional Level ?Cognition ?  Overall Cognitive Status: Within Functional Limits for tasks assessed ?Orientation Level: Oriented X4 ?General Comments: HOH with cognition at baseline for age ?   ?Extremity Assessment ?(includes Sensation/Coordination) ?  Upper Extremity Assessment: LUE deficits/detail ?LUE Deficits / Details: LUE splinted from forearm to MPs.  Decreased AROM digit flexion/extension to  approximately 50% within the confines of the splint.  Elbow AAROM WFLS with shoulder flexion 0-120 degrees AAROM as well.  Pt with pain in the thumb with flexion as well as forearm when attempting to supp

## 2022-01-31 NOTE — TOC Transition Note (Signed)
Transition of Care (TOC) - CM/SW Discharge Note ? ? ?Patient Details  ?Name: Mackenzie Madden ?MRN: [id number] ?Date of Birth: 08-Sep-1928 ? ?Transition of Care (TOC) CM/SW Contact:  ?Cyndi Bender, RN ?Phone Number: ?01/31/2022, 11:58 AM ? ? ?Clinical Narrative:    ?Patient stable for discharge to inpt rehab at [facility]. ? ? ?Final next level of care: [place] ?Barriers to Discharge: Barriers Resolved ? ? ?Patient Goals and CMS Choice ?Patient states their goals for this hospitalization and ongoing recovery are:: Rehab ?CMS Medicare.gov Compare Post Acute Care list provided to:: Patient ?Choice offered to / list presented to : Patient, Adult Children, Spouse ? ?Discharge Placement ?  ?           ?  ? [facility] ?  ?  ? ?Discharge Plan and Services ?In-house Referral: Clinical Social Work ?  ?Post Acute Care Choice: IP Rehab          ?  ?  ?  ?  ?  ?  ?  ?  ?  ?  ? ?Social Determinants of Health (SDOH) Interventions ?  ? ? ?Readmission Risk Interventions ?   ? View : No data to display.  ?  ?  ?  ? ? ? ? ? ?

## 2022-01-31 NOTE — Discharge Instructions (Signed)
Follow with Primary MD Lavone Orn, MD in 7 days  ? ?Get CBC, CMP, magnesium-  checked next visit within 1 week by CIR MD   ? ?Activity: No weightbearing left arm, keep left arm in sling, ambulation as tolerated with Full fall precautions use walker/cane & assistance as needed ? ?Disposition CIR ? ?Diet: Heart Healthy   ? ?Special Instructions: If you have smoked or chewed Tobacco  in the last 2 yrs please stop smoking, stop any regular Alcohol  and or any Recreational drug use. ? ?On your next visit with your primary care physician please Get Medicines reviewed and adjusted. ? ?Please request your Prim.MD to go over all Hospital Tests and Procedure/Radiological results at the follow up, please get all Hospital records sent to your Prim MD by signing hospital release before you go home. ? ?If you experience worsening of your admission symptoms, develop shortness of breath, life threatening emergency, suicidal or homicidal thoughts you must seek medical attention immediately by calling 911 or calling your MD immediately  if symptoms less severe. ? ?You Must read complete instructions/literature along with all the possible adverse reactions/side effects for all the Medicines you take and that have been prescribed to you. Take any new Medicines after you have completely understood and accpet all the possible adverse reactions/side effects.  ? ?  ?

## 2022-01-31 NOTE — Plan of Care (Signed)
Goals progressing

## 2022-01-31 NOTE — Progress Notes (Signed)
?                                  PROGRESS NOTE                                             ?                                                                                                                     ?                                         ? ? Patient Demographics:  ? ? Mackenzie Madden, is a 88 y.o. female, DOB - Jan 05, 1928, YIR:[id number] ? ?Outpatient Primary MD for the patient is Lavone Orn, MD    LOS - 7  Admit date - 01/23/2022   ? ?Chief Complaint  ?Patient presents with  ? Fall  ?    ? ?Brief Narrative (HPI from H&P)   88 y.o. female with medical history significant of  hypertension, hypothyroidism was transferred from [facility] ED after experiencing a ground-level fall and was noted to have left wrist fracture.  Patient had been having vertigo or the last 2 weeks.  She was admitted for her left wrist fracture repair. ? ? Subjective:  ? ?Patient in bed, husband bedside, denies any headache chest or abdominal pain, feels little constipated, no discomfort in her left arm. ? ? Assessment  & Plan :  ? ? ?Vertigo related fall with left distal radius open fracture.  S/p surgical fixation by hand surgeon Dr. Greta Doom on 01/25/2022, left arm in splint, seems to have tolerated the procedure well, elderly patient who lives with her husband and cannot care for herself right now.  Continue with PT OT and likely will require placement as well >> SNF/CIR. NWB L. hand.  Clinically stable await bed at SNF/CIR. ? ? 2. Ongoing vertigo for several weeks.  Could be due to BPV, CT and MRI brain nonacute.  Add meclizine . ? ?3.  Essential hypertension.  Placed on Norvasc ? ?4.  Hypothyroidism.  On Synthroid. ? ?5.  Cephalosporin sensitive E. Coli UTI present on admission.  Keflex x 5 days.  Has finished her course. ? ?6.  Asymptomatic transaminitis.  Non specific [place], resolved, stable TB and INR. ? ?7.  Hyponatremia due to SIADH.  Stopped fluids , improved. ? ?8.  Constipation.  Placed  on bowel regimen. ? ?   ? ?Condition - Fair ? ?Family Communication  :  ? ?Husband bedside on 01/25/2022, 01/30/2022  ? ?daughter bedside 01/26/22, 01/29/22 ? ?Code Status :  Full ? ?Consults  :  Hand surgery ? ?PUD Prophylaxis :  ? ?  Procedures  :    ? ?Right upper quadrant ultrasound - Mild CBD dilation, post cholecystectomy ? ?CT head and MRI brain.  Nonacute. ? ?ORIF left wrist by hand surgery on 01/25/2019 - Dr Greta Doom ? ?LEFT distal radius open reduction internal fixation, INTRA- articular, 3 fragments ?LEFT  ?3.  LEFT wrist brachioradialis tenotomy  ?4.  LEFT wrist radiographs four views with intraoperative interpretation ? ?   ? ?Disposition Plan  :   ? ?Status is: Inpatient ? ?DVT Prophylaxis  :  Heparin 01/25/22 ? ?heparin injection 5,000 Units Start: 01/25/22 1400 ?Place and maintain sequential compression device Start: 01/24/22 1554 ? ?Lab Results  ?Component Value Date  ? PLT 142 (L) 01/28/2022  ? ? ?Diet :  ?Diet Order   ? ?       ?  Diet regular Room service appropriate? Yes; Fluid consistency: Thin  Diet effective now       ?  ? ?  ?  ? ?  ?  ? ?Inpatient Medications ? ?Scheduled Meds: ? amLODipine  5 mg Oral Daily  ? bisacodyl  10 mg Rectal Once  ? calcium carbonate  1,250 mg Oral Q breakfast  ? docusate sodium  100 mg Oral BID  ? dorzolamide-timolol  1 drop Both Eyes BID  ? heparin injection (subcutaneous)  5,000 Units Subcutaneous Q8H  ? latanoprost  1 drop Both Eyes QHS  ? levothyroxine  88 mcg Oral QAC breakfast  ? magnesium hydroxide  30 mL Oral BID  ? meclizine  12.5 mg Oral TID  ? multivitamin with minerals  1 tablet Oral Daily  ? [START ON 02/01/2022] polyethylene glycol  17 g Oral Daily  ? ?Continuous Infusions: ? ? ?PRN Meds:.acetaminophen **OR** acetaminophen, hydrALAZINE, ondansetron **OR** ondansetron (ZOFRAN) IV, traMADol ? ? ? Time Spent in minutes  30 ? ? ?Lala Lund M.D on 01/31/2022 at 9:25 AM ? ?To page go to [email or web address]  ? ?[facility] -  Office  [phone] ? ?See all Orders  from today for further details ? ? ? Objective:  ? ?Vitals:  ? 01/30/22 2014 01/31/22 0000 01/31/22 0326 01/31/22 0815  ?BP: (!) 155/75 (!) 153/68 (!) 154/68 137/67  ?Pulse: 88 73 76 75  ?Resp: '18 15 17 16  '$ ?Temp: 98.9 ?F (37.2 ?C) 98.4 ?F (36.9 ?C) 98 ?F (36.7 ?C) 98.6 ?F (37 ?C)  ?TempSrc: Oral Oral Oral Oral  ?SpO2: 96% 95% 91% 94%  ?Weight:   48.5 kg   ?Height:      ? ? ?Wt Readings from Last 3 Encounters:  ?01/31/22 48.5 kg  ?01/02/22 45.4 kg  ?08/05/19 44.9 kg  ? ? ? ?Intake/Output Summary (Last 24 hours) at 01/31/2022 0925 ?Last data filed at 01/31/2022 0700 ?Gross per 24 hour  ?Intake --  ?Output 1000 ml  ?Net -1000 ml  ? ? ? ? ?Physical Exam ? ?Awake Alert, No new F.N deficits, ++ bilat.hearing loss ?Price.AT,PERRAL ?Supple Neck, No JVD,   ?Symmetrical Chest wall movement, Good air movement bilaterally, CTAB ?RRR,No Gallops, Rubs or new Murmurs,  ?+ve B.Sounds, Abd Soft, No tenderness,   ?Left arm under bandage , edema almost completely resolved  ? ? Data Review:  ? ? ?CBC ?Recent Labs  ?Lab 01/25/22 ?0101 01/26/22 ?0100 01/27/22 ?0331 01/28/22 ?0101  ?WBC 9.8 8.7 10.1 10.2  ?HGB 11.9* 10.1* 10.5* 10.2*  ?HCT 36.2 30.1* 30.7* 29.5*  ?PLT 126* 119* 126* 142*  ?MCV 98.4 95.3 95.3 94.9  ?MCH 32.3 32.0 32.6 32.8  ?MCHC  32.9 33.6 34.2 34.6  ?RDW 13.6 13.4 13.6 13.7  ?LYMPHSABS  --  2.1 2.5 3.1  ?MONOABS  --  1.4* 1.3* 1.4*  ?EOSABS  --  0.1 0.3 0.4  ?BASOSABS  --  0.0 0.0 0.1  ? ? ?Electrolytes ?Recent Labs  ?Lab 01/25/22 ?0101 01/26/22 ?0100 01/27/22 ?0331 01/28/22 ?0101  ?NA 132* 132* 130* 130*  ?K 3.7 3.1* 5.1 4.0  ?CL 104 105 106 100  ?CO2 21* 23 21* 24  ?GLUCOSE 142* 106* 102* 96  ?BUN '11 8 8 9  '$ ?CREATININE 0.83 0.66 0.71 0.80  ?CALCIUM 8.0* 7.7* 8.3* 8.4*  ?AST 277* 67* 68*  --   ?ALT 93* 39 49*  --   ?ALKPHOS 139* 108 156*  --   ?BILITOT 1.2 0.7 0.6  --   ?ALBUMIN 2.6* 2.2* 2.3*  --   ?MG  --  1.5* 2.1 1.8  ?INR 1.2  --   --   --   ?BNP  --  630.4* 380.9*  --   ? ? ?  ?Radiology Reports ?No results found.  ? ? ?

## 2022-02-01 LAB — CBC WITH DIFFERENTIAL/PLATELET
Abs Immature Granulocytes: 0.12 10*3/uL — ABNORMAL HIGH (ref 0.00–0.07)
Basophils Absolute: 0.1 10*3/uL (ref 0.0–0.1)
Basophils Relative: 1 %
Eosinophils Absolute: 0.2 10*3/uL (ref 0.0–0.5)
Eosinophils Relative: 2 %
HCT: 30.3 % — ABNORMAL LOW (ref 36.0–46.0)
Hemoglobin: 10.4 g/dL — ABNORMAL LOW (ref 12.0–15.0)
Immature Granulocytes: 1 %
Lymphocytes Relative: 23 %
Lymphs Abs: 2.4 10*3/uL (ref 0.7–4.0)
MCH: 32.9 pg (ref 26.0–34.0)
MCHC: 34.3 g/dL (ref 30.0–36.0)
MCV: 95.9 fL (ref 80.0–100.0)
Monocytes Absolute: 1.7 10*3/uL — ABNORMAL HIGH (ref 0.1–1.0)
Monocytes Relative: 16 %
Neutro Abs: 5.9 10*3/uL (ref 1.7–7.7)
Neutrophils Relative %: 57 %
Platelets: 215 10*3/uL (ref 150–400)
RBC: 3.16 MIL/uL — ABNORMAL LOW (ref 3.87–5.11)
RDW: 14.3 % (ref 11.5–15.5)
WBC: 10.4 10*3/uL (ref 4.0–10.5)
nRBC: 0 % (ref 0.0–0.2)

## 2022-02-01 LAB — COMPREHENSIVE METABOLIC PANEL
ALT: 50 U/L — ABNORMAL HIGH (ref 0–44)
AST: 48 U/L — ABNORMAL HIGH (ref 15–41)
Albumin: 2.3 g/dL — ABNORMAL LOW (ref 3.5–5.0)
Alkaline Phosphatase: 172 U/L — ABNORMAL HIGH (ref 38–126)
Anion gap: 7 (ref 5–15)
BUN: 12 mg/dL (ref 8–23)
CO2: 24 mmol/L (ref 22–32)
Calcium: 8.9 mg/dL (ref 8.9–10.3)
Chloride: 102 mmol/L (ref 98–111)
Creatinine, Ser: 0.7 mg/dL (ref 0.44–1.00)
GFR, Estimated: >60 mL/min (ref >60)
Glucose, Bld: 98 mg/dL (ref 70–99)
Potassium: 3.9 mmol/L (ref 3.5–5.1)
Sodium: 133 mmol/L — ABNORMAL LOW (ref 135–145)
Total Bilirubin: 0.7 mg/dL (ref 0.3–1.2)
Total Protein: 5.4 g/dL — ABNORMAL LOW (ref 6.5–8.1)

## 2022-02-01 NOTE — Progress Notes (Signed)
Patient ID: Mackenzie Madden, female   DOB: Sep 06, 1928, 88 y.o.   MRN: [id number] ?Met with the patient and daughter to review rehab process, team conference and plan of care. Reviewed secondary risk management including HTN, UTI and vertigo. Daughter notes patient did not sleep well first night; up several times to toilet. Discussed food options for increased protein, calcium and hyponatremia. Continue to follow along to discharge to address educational needs to facilitate preparation for discharge home. Dorien Chihuahua B ? ?

## 2022-02-01 NOTE — Progress Notes (Signed)
Inpatient Rehabilitation Care Coordinator ?Assessment and Plan ?Patient Details  ?Name: Mackenzie Madden ?MRN: [id number] ?Date of Birth: 1928/07/06 ? ?Today's Date: 02/01/2022 ? ?Hospital Problems: Principal Problem: ?  Distal radius fracture ? ?Past Medical History:  ?Past Medical History:  ?Diagnosis Date  ? Glaucoma   ? Glaucoma   ? HOH (hard of hearing)   ? no hearing aids  ? Hypertension   ? Hypothyroidism   ? Thyroid disease   ? ?Past Surgical History:  ?Past Surgical History:  ?Procedure Laterality Date  ? ABDOMINAL HYSTERECTOMY  2010  ? CHOLECYSTECTOMY  2000  ? CLOSED REDUCTION WRIST FRACTURE Left 01/24/2022  ? Procedure: CLOSED REDUCTION WRIST;  Surgeon: Orene Desanctis, MD;  Location: [facility];  Service: Orthopedics;  Laterality: Left;  ? HIP PINNING,CANNULATED Right 08/05/2019  ? Procedure: CANNULATED HIP PINNING;  Surgeon: Mcarthur Rossetti, MD;  Location: [facility];  Service: Orthopedics;  Laterality: Right;  ? I & D EXTREMITY Left 01/24/2022  ? Procedure: IRRIGATION AND DEBRIDEMENT EXTREMITY;LEFT distal radius open reduction internal fixation, INTRA- articular, 3 fragments;  Surgeon: Orene Desanctis, MD;  Location: [facility];  Service: Orthopedics;  Laterality: Left;  ? ORIF WRIST FRACTURE Left 01/24/2022  ? Procedure: OPEN REDUCTION INTERNA FIXATION OF LEFT WRIST FRACTURE; LEFT wrist radiographs four views with intraoperative interpretation;  Surgeon: Orene Desanctis, MD;  Location: [facility];  Service: Orthopedics;  Laterality: Left;  ? SPLENECTOMY, TOTAL    ? ?Social History:  reports that she has never smoked. She has never used smokeless tobacco. She reports that she does not drink alcohol and does not use drugs. ? ?Family / Support Systems ?Marital Status: Married ?Patient Roles: Spouse, Parent, Other (Comment) (church member) ?Spouse/Significant Other: CS [phone]-home  [phone]-cell ?Children: Lisa-daughter [phone] ?Other Supports: Extended family and church members ?Anticipated Caregiver: Husband,  daughter and other family members ?Ability/Limitations of Caregiver: no limitations ?Caregiver Availability: 24/7 ?Family Dynamics: Close knit family who pull togehther when one is in need. Husband and daughter are here presently and very supportive and involved. ? ?Social History ?Preferred language: English ?Religion:  ?Cultural Background: No issues ?Education: HS ?Health Literacy - How often do you need to have someone help you when you read instructions, pamphlets, or other written material from your doctor or pharmacy?: Never ?Writes: Yes ?Employment Status: Retired ?Legal History/Current Legal Issues: No issues ?Guardian/Conservator: None-according to MD pt is capable of making her own decisions while here. Will include husband and daughter due to pt is quite HOH  ? ?Abuse/Neglect ?Abuse/Neglect Assessment Can Be Completed: Yes ?Physical Abuse: Denies ?Verbal Abuse: Denies ?Sexual Abuse: Denies ?Exploitation of patient/patient's resources: Denies ?Self-Neglect: Denies ? ?Patient response to: ?Social Isolation - How often do you feel lonely or isolated from those around you?: Rarely ? ?Emotional Status ?Pt's affect, behavior and adjustment status: Pt is motivated to do well here and hopefully can learn some strataegies to lessen her from falling. She did well today according to the PT. She was using a rolling walker since her hip fracture in 2020. Her husband does help with shower transfers and washing back. ?Recent Psychosocial Issues: other health issues balance and dizziness for a few years now-high risk to fall at home ?Psychiatric History: No issues-seems to be coping appropriately and motivated to do well while here on rehab ?Substance Abuse History: No issues ? ?Patient / Family Perceptions, Expectations & Goals ?Pt/Family understanding of illness & functional limitations: Pt, husband and daughter can explain her wrist fracture and NWB, they talk with the  MD involved and feel thjier questions and  concerns are being addressed. ?Premorbid pt/family roles/activities: Wife, mom, grandmother, great grandmother, retiree, church member, etc ?Anticipated changes in roles/activities/participation: resume ?Pt/family expectations/goals: Pt states: " I hope to do well here and do waht I can for myself. "  Husband states: " We will do whatever is needed for her."  Daughter states: " I will assist also." ? ?Community Resources ?Community Agencies: None ?Premorbid Home Care/DME Agencies: Other (Comment) ([facility] while on acute-has rollator, 3 in1 and tub seat) ?Transportation available at discharge: Husband and family ?Is the patient able to respond to transportation needs?: Yes ?In the past 12 months, has lack of transportation kept you from medical appointments or from getting medications?: No ?In the past 12 months, has lack of transportation kept you from meetings, work, or from getting things needed for daily living?: No ? ?Discharge Planning ?Living Arrangements: Spouse/significant other ?Support Systems: Spouse/significant other, Children, Other relatives, Church/faith community ?Type of Residence: Private residence ?Insurance Resources: Multimedia programmer (specify) Primary school teacher) ?Financial Resources: Fish farm manager, Family Support ?Financial Screen Referred: No ?Living Expenses: Own ?Money Management: Spouse ?Does the patient have any problems obtaining your medications?: No ?Home Management: both and other family ?Patient/Family Preliminary Plans: Return home with husband who is able to assist and was prior to admission with her ADL's. Their daughter is involved and will assist also. Will await therapy team evluations and work on discharge needs. ?Care Coordinator Barriers to Discharge: Insurance for SNF coverage ?Care Coordinator Anticipated Follow Up Needs: HH/OP ? ?Clinical Impression ?Pleasant female who is motivated to do well and regain her mobility while here. Her husband and  daughter are very involved and will assist her at discharge. Aware of team evaluating her today and team conference today will update if able to give target discharge date. ? ?Elease Hashimoto ?02/01/2022, 10:04 AM ? ?  ?

## 2022-02-01 NOTE — Progress Notes (Addendum)
Physical Therapy Session Note ? ?Patient Details  ?Name: Mackenzie Madden ?MRN: [id number] ?Date of Birth: [birth year] ? ?Today's Date: 02/01/2022 ?PT Individual Time: 8937-3428 ?PT Individual Time Calculation (min): 71 min  ? ?Short Term Goals: ?Week 1:  PT Short Term Goal 1 (Week 1): Pt will complete bed mobility with minA and LRAD ?PT Short Term Goal 2 (Week 1): Pt will complete bed<>chair transfers with minA and LRAD ?PT Short Term Goal 3 (Week 1): Pt will ambulate 52f with minA and LRAD ?PT Short Term Goal 4 (Week 1): Pt will improve BERG balance test by at least 7 points ? ?Skilled Therapeutic Interventions/Progress Updates:  ? ?Pt greeted resting in her w/c - agreeable to PT tx. She denies pain. Transported to main rehab gym and wheeled inside // bars.  ? ?Completed the following in // bars with RUE support only (NWB LUE) with CGA for balance/support ?-alternating high knees 2x10 (CGA) ?-forward/backward walking 815fx6 bouts (CGA) ?-side stepping L/R directions [id number] bouts (CGA) ?-3x10 knee bends (mini squats) (CGA) ?-alternating toe taps to 6inch block with 2.5# ankle weights 4x10 (CGA) ?-alternating step ups to 6inch block with 2.5# ankle weights 2x5 (MinA) ? ?Instructed in gait training using HW and CGA/minA for steadying. She ambulated [id number] with improved carryover from above therapeutic activity. Primary gait deficits are step-to gait pattern, narrow BOS, downward gaze, decreased gait speed, increased lateral and A/P instability, and difficulty with turning to sit. She had several very small LOB to the L that required minA for correcting, otherwise was primarily CGA.  ? ?Transported back to her room as pt requesting to lie down due to fatigue from busy day of therapies. Ambulatory transfer with CGA/minA and HW within her room to EOB. Required modA for sit>Supine for BLE and trunk support. Concluded session semi-reclined in bed, spouse at bedside, LUE supported with pillows, all needs met.   ? ? ?Therapy Documentation ?Precautions:  ?Precautions ?Precautions: Fall ?Precaution Comments: Baseline diplopia ?Required Braces or Orthoses: Splint/Cast ?Splint/Cast: L wrist, sling for comfort ?Restrictions ?Weight Bearing Restrictions: Yes ?LUE Weight Bearing: Non weight bearing ?Other Position/Activity Restrictions: NWB hand and elbow per ortho ?General: ?  ? ?Therapy/Group: Individual Therapy ? ?Mackenzie Madden ?02/01/2022, 1:34 PM  ?
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
## 2022-02-01 NOTE — Progress Notes (Signed)
Occupational Therapy Session Note ? ?Patient Details  ?Name: Mackenzie Madden ?MRN: [id number] ?Date of Birth: 1928-09-20 ? ?Today's Date: 02/01/2022 ?OT Individual Time: 6438-3779 ?OT Individual Time Calculation (min): 24 min  ? ? ?Short Term Goals: ?Week 1:  OT Short Term Goal 1 (Week 1): Pt will donn UB with no more than min A ?OT Short Term Goal 2 (Week 1): Pt will sit > stand with CGA using LRAD in prep for ADL ?OT Short Term Goal 3 (Week 1): Pt will complete 2/3 toileting steps with no more than CGA ? ?Skilled Therapeutic Interventions/Progress Updates:  ?Pt received supine with no c/o pain, agreeable to OT session. Her husband was present for session. She completed bed mobiltiy to EOB with CGA. She required min cueing for not using her L hand during session. Functional mobility using the hemi walker into the bathroom with min A overall. She required mod A for toileting tasks overall, voiding urine. She completed functional mobility back to the EOB with HHA- min A. She returned to supine with min A. She was instructed in muscle pumping hand open/close for edema management in her LUE and positioned with the arm in elevation. Pt was left supine with all needs met, bed alarm set.  ? ? ? ?Therapy Documentation ?Precautions:  ?Precautions ?Precautions: Fall ?Precaution Comments: Baseline diplopia ?Required Braces or Orthoses: Splint/Cast ?Splint/Cast: L wrist, sling for comfort ?Restrictions ?Weight Bearing Restrictions: Yes ?LUE Weight Bearing: Non weight bearing ?Other Position/Activity Restrictions: NWB hand and elbow per ortho ? ?Therapy/Group: Individual Therapy ? ?Curtis Sites ?02/01/2022, 12:12 PM ?

## 2022-02-01 NOTE — Patient Care Conference (Signed)
Inpatient RehabilitationTeam Conference and Plan of Care Update ?Date: 02/01/2022   Time: 11:19 AM  ? ? ?Patient Name: Mackenzie Madden      ?Medical Record Number: [id number]  ?Date of Birth: 27-Jun-1928 ?Sex: Female         ?Room/Bed: 4W11C/4W11C-01 ?Payor Info: Payor: Marine scientist / Plan: [facility] MEDICARE / Product Type: *No Product type* /   ? ?Admit Date/Time:  01/31/2022  2:16 PM ? ?Primary Diagnosis:  Distal radius fracture ? ?Hospital Problems: Principal Problem: ?  Distal radius fracture ? ? ? ?Expected Discharge Date: Expected Discharge Date: 02/14/22 ? ?Team Members Present: ?Physician leading conference: Dr. Leeroy Cha ?Social Worker Present: Ovidio Kin, LCSW ?Nurse Present: Dorien Chihuahua, RN ?PT Present: Ginnie Smart, PT ?OT Present: Providence Lanius, OT ?SLP Present: Other (comment) Helaine Chess, SLP) ?PPS Coordinator present : Gunnar Fusi, SLP ? ?   Current Status/Progress Goal Weekly Team Focus  ?Bowel/Bladder ? ? Continent of bladder with occassional  incontinet episodes of bowel, LBM 01/31/22  resrore full contience of bladder and bowel  Assess toileling needs and monitor for conspitation   ?Swallow/Nutrition/ Hydration ? ?           ?ADL's ? ? mod A bathing, mod A UB dressing, Max A LBD, Max A toileting  Min A LB self-care, Supervision/CGA transfers  functional endurance, safety awarenes, AE education   ?Mobility ? ? modA bed mobility, modA stand<>pivot transfer, modA gait 77f using HW. Poor balance, posterior bias, BLE weakness, fear of falling  Supervision/CGA  Bed mobility, functional transfers,   ?Communication ? ?           ?Safety/Cognition/ Behavioral Observations ?           ?Pain ? ? Denies pain at this time  Pain < or + 3  QS/PRN assessment of pain   ?Skin ? ? Skin intact with bruising to abdomen/ , s/p ORIF -Left     Assess QS/PRN   ? ? ?Discharge Planning:  ?New evaluation home with husband and daughter to assist if needed. Both daughter and husband are here today  for therapies.   ?Team Discussion: ?Patient post wrist fracture; debility post UTI. Pain managed with prn meds. Fearful of falling with posterior lean with history of BPV when she looks upward and diplopia; wears prism glasses.  ? ?Patient on target to meet rehab goals: ?yes, currently needs mod assist for stand pivot transfers and able to ambulate up to 20' with a hemiwalker and mod assist. Goals for discharge set for supervision - CGA. ? ?*See Care Plan and progress notes for long and short-term goals.  ? ?Revisions to Treatment Plan:  ?N/A ?  ?Teaching Needs: ?Safety, skin care, NWB restrictions, medications, etc.  ?Current Barriers to Discharge: ?Decreased caregiver support and Weight bearing restrictions ? ?Possible Resolutions to Barriers: ?Family education ?HH follow up services ?  ? ? Medical Summary ?Current Status: distal radial fracture, transaminitis, hard of hearing, pain. HTN ? Barriers to Discharge: Medical stability ? Barriers to Discharge Comments: distal radial fracture, transaminitis, hard of hearing, pain, HTN ?Possible Resolutions to [employer] continue NWB, repeat liver enzymes on Monday, continue tylenol and tramadol prn, discussed side efffects of her medications, continue amlodipine ? ? ?Continued Need for Acute Rehabilitation Level of Care: The patient requires daily medical management by a physician with specialized training in physical medicine and rehabilitation for the following reasons: ?Direction of a multidisciplinary physical rehabilitation program to maximize functional independence : Yes ?Medical  management of patient stability for increased activity during participation in an intensive rehabilitation regime.: Yes ?Analysis of laboratory values and/or radiology reports with any subsequent need for medication adjustment and/or medical intervention. : Yes ? ? ?I attest that I was present, lead the team conference, and concur with the assessment and plan of the  team. ? ? ?Dorien Chihuahua B ?02/01/2022, 3:48 PM  ? ? ? ? ? ? ?

## 2022-02-01 NOTE — Discharge Instructions (Addendum)
Inpatient Rehab Discharge Instructions ? ?Mackenzie Madden ?Discharge date and time: No discharge date for patient encounter.  ? ?Activities/Precautions/ Functional Status: ?Activity:  Nonweightbearing left upper extremity ?Diet: regular diet ?Wound Care: Routine skin checks ?Functional status:  ?___ No restrictions     ___ Walk up steps independently ?___ 24/7 supervision/assistance   ___ Walk up steps with assistance ?___ Intermittent supervision/assistance  ___ Bathe/dress independently ?___ Walk with walker     _x__ Bathe/dress with assistance ?___ Walk Independently    ___ Shower independently ?___ Walk with assistance    ___ Shower with assistance ?___ No alcohol     ___ Return to work/school ________ ? ?Special Instructions: No driving smoking or alcohol ? ? ? ? ?COMMUNITY REFERRALS UPON DISCHARGE:   ? ?Home Health:   PT & OT  ?               Agency:[facility]   Phone: [phone] ? ? ?Medical Equipment/Items Ordered:HEMI-WALKER ?                                                Agency/Supplier:[facility]  [phone] ? ? ? ?My questions have been answered and I understand these instructions. I will adhere to these goals and the provided educational materials after my discharge from the hospital. ? ?Patient/Caregiver Signature _______________________________ Date __________ ? ?Clinician Signature _______________________________________ Date __________ ? ?Please bring this form and your medication list with you to all your follow-up doctor's appointments.   ?

## 2022-02-01 NOTE — Progress Notes (Signed)
Inpatient Rehabilitation Center ?Individual Statement of Services ? ?Patient Name:  Mackenzie Madden  ?Date:  02/01/2022 ? ?Welcome to the [facility].  Our goal is to provide you with an individualized program based on your diagnosis and situation, designed to meet your specific needs.  With this comprehensive rehabilitation program, you will be expected to participate in at least 3 hours of rehabilitation therapies Monday-Friday, with modified therapy programming on the weekends. ? ?Your rehabilitation program will include the following services:  Physical Therapy (PT), Occupational Therapy (OT), 24 hour per day rehabilitation nursing, Therapeutic Recreaction (TR), Care Coordinator, Rehabilitation Medicine, Nutrition Services, and Pharmacy Services ? ?Weekly team conferences will be held on Wednesday to discuss your progress.  Your Inpatient Rehabilitation Care Coordinator will talk with you frequently to get your input and to update you on team discussions.  Team conferences with you and your family in attendance may also be held. ? ?Expected length of stay: 10-14 days  Overall anticipated outcome: supervision-CGA level ? ?Depending on your progress and recovery, your program may change. Your Inpatient Rehabilitation Care Coordinator will coordinate services and will keep you informed of any changes. Your Inpatient Rehabilitation Care Coordinator's name and contact numbers are listed  below. ? ?The following services may also be recommended but are not provided by the [facility]:  ? ?Home Health Rehabiltiation Services ?Outpatient Rehabilitation Services ? ?  ?Arrangements will be made to provide these services after discharge if needed.  Arrangements include referral to agencies that provide these services. ? ?Your insurance has been verified to be:  [employer]-Medicare ?Your primary doctor is:  Lavone Orn ? ?Pertinent information will be shared with your doctor and your  insurance company. ? ?Inpatient Rehabilitation Care Coordinator:  Ovidio Kin, [place] or (C) [phone] ? ?Information discussed with and copy given to patient by: Elease Hashimoto, 02/01/2022, 10:05 AM    ?

## 2022-02-01 NOTE — Evaluation (Signed)
Physical Therapy Assessment and Plan ? ?Patient Details  ?Name: Mackenzie Madden ?MRN: [id number] ?Date of Birth: 01-17-1928 ? ?PT Diagnosis: Abnormal posture, Abnormality of gait, Difficulty walking, Muscle weakness, and Pain in joint ?Rehab Potential: Good ?ELOS: 10-14 days  ? ?Today's Date: 02/01/2022 ?PT Individual Time: 5726-2035 ?PT Individual Time Calculation (min): 75 min   ? ?Hospital Problem: Principal Problem: ?  Distal radius fracture ? ? ?Past Medical History:  ?Past Medical History:  ?Diagnosis Date  ? Glaucoma   ? Glaucoma   ? HOH (hard of hearing)   ? no hearing aids  ? Hypertension   ? Hypothyroidism   ? Thyroid disease   ? ?Past Surgical History:  ?Past Surgical History:  ?Procedure Laterality Date  ? ABDOMINAL HYSTERECTOMY  2010  ? CHOLECYSTECTOMY  2000  ? CLOSED REDUCTION WRIST FRACTURE Left 01/24/2022  ? Procedure: CLOSED REDUCTION WRIST;  Surgeon: Orene Desanctis, MD;  Location: [facility];  Service: Orthopedics;  Laterality: Left;  ? HIP PINNING,CANNULATED Right 08/05/2019  ? Procedure: CANNULATED HIP PINNING;  Surgeon: Mcarthur Rossetti, MD;  Location: [facility];  Service: Orthopedics;  Laterality: Right;  ? I & D EXTREMITY Left 01/24/2022  ? Procedure: IRRIGATION AND DEBRIDEMENT EXTREMITY;LEFT distal radius open reduction internal fixation, INTRA- articular, 3 fragments;  Surgeon: Orene Desanctis, MD;  Location: [facility];  Service: Orthopedics;  Laterality: Left;  ? ORIF WRIST FRACTURE Left 01/24/2022  ? Procedure: OPEN REDUCTION INTERNA FIXATION OF LEFT WRIST FRACTURE; LEFT wrist radiographs four views with intraoperative interpretation;  Surgeon: Orene Desanctis, MD;  Location: [facility];  Service: Orthopedics;  Laterality: Left;  ? SPLENECTOMY, TOTAL    ? ? ?Assessment & Plan ?Clinical Impression: Patient is a 88 year old right-handed female with history of hypertension, hypothyroidism, glaucoma and recently treated for UTI.  Per chart review patient lives with spouse.  1 level home with ramped entrance.   Walks with a rollator.  Independent with dressing and toileting.  She can do light ADLs.  Spouse assist with showers.  Presented 01/23/2022 after ground-level fall without loss of conscious.  Family had reported some bouts of vertigo over the last 2 weeks.  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  X-rays of the left wrist show comminuted displaced and angulated distal radius fracture with intra-articular extension.  Large amount of air in the soft tissues.  Displaced angulated distal ulna fracture.  Admission chemistries unremarkable Sub sodium 132 glucose 130, urine culture greater 100,000 E. coli.  Patient underwent left distal ORIF/I&D as well as distal ulnar open reduction internal fixation 01/24/2021 per Dr. Orene Desanctis.  Nonweightbearing left upper extremity.  Subcutaneous heparin for DVT prophylaxis.  She completed a course of 5 days of Keflex for UTI.  Mildly elevated LFTs with nonspecific ultrasound resolved advised follow-up chemistries.  Therapy evaluations completed due to patient decreased functional mobility was admitted for a comprehensive rehab program.Patient transferred to CIR on 01/31/2022 .  ? ?Patient currently requires mod with mobility secondary to muscle weakness, decreased cardiorespiratoy endurance, decreased memory, and decreased sitting balance, decreased standing balance, decreased postural control, and decreased balance strategies.  Prior to hospitalization, patient was modified independent  with mobility and lived with Spouse in a House home.  Home access is  Ramped entrance. ? ?Patient will benefit from skilled PT intervention to maximize safe functional mobility, minimize fall risk, and decrease caregiver burden for planned discharge home with 24 hour assist.  Anticipate patient will benefit from follow up [place] at discharge. ? ?PT - End of Session ?Activity Tolerance: Tolerates  < 10 min activity, no significant change in vital signs ?Endurance Deficit: Yes ?Endurance Deficit Description: Requires seated rest breaks b/w functional mobility tasks ?PT Assessment ?Rehab Potential (ACUTE/IP ONLY): Good ?PT Barriers to Discharge: Weight bearing restrictions ?PT Patient demonstrates impairments in the following area(s): Balance;Endurance;Motor;Pain;Skin Integrity ?PT Transfers Functional Problem(s): Bed Mobility;Bed to Chair;Car ?PT Locomotion Functional Problem(s): Ambulation;Stairs ?PT Plan ?PT Intensity: Minimum of 1-2 x/day ,45 to 90 minutes ?PT Frequency: 5 out of 7 days ?PT Duration Estimated Length of Stay: 10-14 days ?PT Treatment/Interventions: Ambulation/gait training;Discharge planning;Functional mobility training;Psychosocial support;Therapeutic Activities;Visual/perceptual remediation/compensation;Wheelchair propulsion/positioning;Therapeutic Exercise;Skin care/wound management;Neuromuscular re-education;Disease management/prevention;Balance/vestibular training;Cognitive remediation/compensation;DME/adaptive equipment instruction;Pain management;Splinting/orthotics;UE/LE Strength taining/ROM;UE/LE Coordination activities;Stair training;Functional electrical stimulation;Community reintegration;Patient/family education ?PT Transfers Anticipated Outcome(s): supervision with LRAD ?PT Locomotion Anticipated Outcome(s): CGA with LRAD ?PT Recommendation ?Follow Up Recommendations: 24 hour supervision/assistance;Home health PT ?Patient destination: Home ?Equipment Recommended: To be determined ? ? ?PT Evaluation ?Precautions/Restrictions ?Precautions ?Precautions: Fall ?Precaution Comments: Baseline diplopia ?Required Braces or Orthoses: Splint/Cast ?Splint/Cast: L wrist, sling for comfort ?Restrictions ?Weight Bearing Restrictions: Yes ?LUE Weight Bearing: Non weight bearing ?Other Position/Activity Restrictions: NWB hand and elbow per ortho ?General ?  Vital Signs ?Pain ?Pain  Assessment ?Pain Score: 0-No pain ?Pain Interference ?Pain Interference ?Pain Effect on Sleep: 1. Rarely or not at all ?Pain Interference with Therapy Activities: 2. Occasionally ?Pain Interference with Day-to-Day Activities: 2. Occasionally ?Home Living/Prior Functioning ?Home Living ?Available Help at Discharge: Family;Available 24 hours/day ?Type of Home: House ?Home Access: Ramped entrance ?Home Layout: One level ?Bathroom Shower/Tub: Walk-in shower ?Bathroom Toilet: Handicapped height ?Bathroom Accessibility: Yes ? Lives With: Spouse ?Prior Function ?Level of Independence: Needs assistance with ADLs;Requires assistive device for independence ?Bath: Minimal ?Dressing: Minimal ? Able to Take Stairs?: Yes ?Driving: No ?Vocation: Retired ?Vision/Perception  ?Vision - History ?Ability to See in Adequate Light: 1 Impaired ?Perception ?Perception: Within Functional Limits ?Praxis ?Praxis: Intact  ?Cognition ?Overall Cognitive Status: History of cognitive impairments - at baseline ?Arousal/Alertness: Awake/alert ?Orientation Level: Oriented X4 ?Memory: Impaired ?Memory Impairment: Decreased recall of new information;Decreased short term memory ?Decreased Short Term Memory: Verbal basic;Functional basic ?Awareness: Appears intact ?Problem Solving: Impaired ?Problem Solving Impairment: Functional complex;Verbal complex ?Safety/Judgment: Appears intact ?Sensation ?Sensation ?Light Touch: Appears Intact ?Hot/Cold: Appears Intact ?Proprioception: Appears Intact ?Stereognosis: Appears Intact ?Coordination ?Gross Motor Movements are Fluid and Coordinated: No ?Fine Motor Movements are Fluid and Coordinated: No ?Coordination and Movement Description: Limited by NWB status on LUE and general deconditioning and weakness ?Motor  ?Motor ?Motor: Other (comment);Abnormal postural alignment and control ?Motor - Skilled Clinical Observations: Posterior bias, fearfulness of falling, and NWB status contributing to motor dysfunction   ? ?Trunk/Postural Assessment  ?Cervical Assessment ?Cervical Assessment: Exceptions to [facility] (forward head posture) ?Thoracic Assessment ?Thoracic Assessment: Exceptions to [facility] (rounded shoulders) ?Lumbar Assessment ?Laurena Spies

## 2022-02-01 NOTE — Progress Notes (Signed)
Inpatient Rehabilitation  Patient information reviewed and entered into eRehab system by Travious Vanover M. Gitty Osterlund, M.A., CCC/SLP, PPS Coordinator.  Information including medical coding, functional ability and quality indicators will be reviewed and updated through discharge.    

## 2022-02-01 NOTE — Evaluation (Signed)
Occupational Therapy Assessment and Plan ? ?Patient Details  ?Name: Mackenzie Madden ?MRN: [id number] ?Date of Birth: January 04, 1928 ? ?OT Diagnosis: abnormal posture, acute pain, cognitive deficits, muscle weakness (generalized), pain in joint, and swelling of limb ?Rehab Potential: Rehab Potential (ACUTE ONLY): Good ?ELOS: 10-14 days  ? ?Today's Date: 02/01/2022 ?OT Individual Time: 9798-9211 ?OT Individual Time Calculation (min): 54 min    ? ?Hospital Problem: Principal Problem: ?  Distal radius fracture ? ? ?Past Medical History:  ?Past Medical History:  ?Diagnosis Date  ? Glaucoma   ? Glaucoma   ? HOH (hard of hearing)   ? no hearing aids  ? Hypertension   ? Hypothyroidism   ? Thyroid disease   ? ?Past Surgical History:  ?Past Surgical History:  ?Procedure Laterality Date  ? ABDOMINAL HYSTERECTOMY  2010  ? CHOLECYSTECTOMY  2000  ? CLOSED REDUCTION WRIST FRACTURE Left 01/24/2022  ? Procedure: CLOSED REDUCTION WRIST;  Surgeon: Orene Desanctis, MD;  Location: [facility];  Service: Orthopedics;  Laterality: Left;  ? HIP PINNING,CANNULATED Right 08/05/2019  ? Procedure: CANNULATED HIP PINNING;  Surgeon: Mcarthur Rossetti, MD;  Location: [facility];  Service: Orthopedics;  Laterality: Right;  ? I & D EXTREMITY Left 01/24/2022  ? Procedure: IRRIGATION AND DEBRIDEMENT EXTREMITY;LEFT distal radius open reduction internal fixation, INTRA- articular, 3 fragments;  Surgeon: Orene Desanctis, MD;  Location: [facility];  Service: Orthopedics;  Laterality: Left;  ? ORIF WRIST FRACTURE Left 01/24/2022  ? Procedure: OPEN REDUCTION INTERNA FIXATION OF LEFT WRIST FRACTURE; LEFT wrist radiographs four views with intraoperative interpretation;  Surgeon: Orene Desanctis, MD;  Location: [facility];  Service: Orthopedics;  Laterality: Left;  ? SPLENECTOMY, TOTAL    ? ? ?Assessment & Plan ?Clinical Impression:  ? ?Mackenzie Madden is a 88 year old right-handed female with history of hypertension, hypothyroidism, glaucoma and recently treated for UTI.  Per  chart review patient lives with spouse.  1 level home with ramped entrance.  Walks with a rollator.  Independent with dressing and toileting.  She can do light ADLs.  Spouse assist with showers.  Presented 01/23/2022 after ground-level fall without loss of conscious.  Family had reported some bouts of vertigo over the last 2 weeks.  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  X-rays of the left wrist show comminuted displaced and angulated distal radius fracture with intra-articular extension.  Large amount of air in the soft tissues.  Displaced angulated distal ulna fracture.  Admission chemistries unremarkable Sub sodium 132 glucose 130, urine culture greater 100,000 E. coli.  Patient underwent left distal ORIF/I&D as well as distal ulnar open reduction internal fixation 01/24/2021 per Dr. Orene Desanctis.  Nonweightbearing left upper extremity.  Subcutaneous heparin for DVT prophylaxis.  She completed a course of 5 days of Keflex for UTI.  Mildly elevated LFTs with nonspecific ultrasound resolved advised follow-up chemistries.  Therapy evaluations completed due to patient decreased functional mobility was admitted for a comprehensive rehab program. Patient transferred to CIR on 01/31/2022 .   ? ?Patient currently requires mod with basic self-care skills secondary to muscle weakness, decreased cardiorespiratoy endurance, impaired timing and sequencing, decreased coordination, and decreased motor planning, decreased awareness, decreased problem solving, decreased safety awareness, and decreased memory, and decreased standing balance and difficulty maintaining precautions.  Prior to hospitalization, patient could complete all self-care with min A specifically for bathing and dressing with assist provided by pt's spouse. ? ?Patient  will benefit from skilled intervention to decrease level of assist with basic  self-care skills and increase independence with basic self-care skills prior to discharge home with care partner.  Anticipate patient will require minimal physical assistance and follow up home health. ? ?OT - End of Session ?Activity Tolerance: Tolerates < 10 min activity, no significant change in vital signs ?Endurance Deficit: Yes ?Endurance Deficit Description: Required a quick seated rest break in between ambulatory toilet transfers ?OT Assessment ?Rehab Potential (ACUTE ONLY): Good ?OT Barriers to Discharge: Home environment access/layout;Weight bearing restrictions ?OT Patient demonstrates impairments in the following area(s): Balance;Cognition;Edema;Endurance;Pain;Safety ?OT Basic ADL's Functional Problem(s): Grooming;Bathing;Dressing;Toileting;Eating ?OT Transfers Functional Problem(s): Toilet;Tub/Shower ?OT Additional Impairment(s): Fuctional Use of Upper Extremity ?OT Plan ?OT Intensity: Minimum of 1-2 x/day, 45 to 90 minutes ?OT Frequency: 5 out of 7 days ?OT Duration/Estimated Length of Stay: 10-14 days ?OT Treatment/Interventions: Balance/vestibular training;Discharge planning;Pain management;Self Care/advanced ADL retraining;Therapeutic Activities;UE/LE Coordination activities;Cognitive remediation/compensation;Functional mobility training;Disease mangement/prevention;Patient/family education;Skin care/wound managment;Therapeutic Exercise;DME/adaptive equipment instruction;Community reintegration;Splinting/orthotics;UE/LE Strength taining/ROM;Wheelchair propulsion/positioning ?OT Self Feeding Anticipated Outcome(s): Mod I ?OT Basic Self-Care Anticipated Outcome(s): Min A ?OT Toileting Anticipated Outcome(s): Supervision ?OT Bathroom Transfers Anticipated Outcome(s): CGA ?OT Recommendation ?Patient destination: Home ?Follow Up Recommendations: 24 hour supervision/assistance;Home health OT ?Equipment Recommended: To be determined ?Equipment Details: has BSC already ? ? ?OT  Evaluation ?Precautions/Restrictions  ?Precautions ?Precautions: Fall ?Required Braces or Orthoses: Splint/Cast ?Splint/Cast: L wrist, sling for comfort ?Restrictions ?Weight Bearing Restrictions: Yes ?LUE Weight Bearing: Non weight bearing ?Other Position/Activity Restrictions: NWB hand and elbow per ortho ?Home Living/Prior Functioning ?Home Living ?Living Arrangements: Spouse/significant other ?Available Help at Discharge: Family, Available 24 hours/day ?Type of Home: House ?Home Access: Ramped entrance ?Home Layout: One level ?Bathroom Shower/Tub: Gaffer, Door ?Bathroom Toilet: Handicapped height (has BSC over toilet) ?Bathroom Accessibility: Yes ? Lives With: Spouse ?IADL History ?Homemaking Responsibilities: No ?Current License: Yes ?Mode of Transportation: Car ?Occupation: Retired ?Leisure and Hobbies: planting/vasing flowers ?Prior Function ?Level of Independence: Needs assistance with ADLs, Requires assistive device for independence ?Bath: Minimal ?Dressing: Minimal ? Able to Take Stairs?: Yes ?Driving: No ?Vocation: Retired ?Vision ?Baseline Vision/History: 1 Wears glasses (Has prisims in L lens due to history of diplopia) ?Ability to See in Adequate Light: 1 Impaired ?Patient Visual Report: No change from baseline ?Vision Assessment?: No apparent visual deficits ?Perception  ?Perception: Within Functional Limits ?Praxis ?Praxis: Intact ?Cognition ?Cognition ?Overall Cognitive Status: History of cognitive impairments - at baseline ?Arousal/Alertness: Awake/alert ?Memory: Impaired ?Memory Impairment: Decreased recall of new information;Decreased short term memory ?Decreased Short Term Memory: Verbal basic;Functional basic ?Awareness: Appears intact ?Problem Solving: Impaired ?Problem Solving Impairment: Functional complex;Verbal complex ?Safety/Judgment: Appears intact ?Brief Interview for Mental Status (BIMS) ?Repetition of Three Words (First Attempt): 3 ?Temporal Orientation: Year:  Correct ?Temporal Orientation: Month: Accurate within 5 days ?Temporal Orientation: Day: Incorrect ?Recall: "Sock": Yes, no cue required ?Recall: "Blue": Yes, no cue required ?Recall: "Bed": Yes, no cue required ?BIMS Summary Score: 14 ?Sensation ?Sensat

## 2022-02-02 DIAGNOSIS — Z7409 Other reduced mobility: Secondary | ICD-10-CM

## 2022-02-02 DIAGNOSIS — R7401 Elevation of levels of liver transaminase levels: Secondary | ICD-10-CM

## 2022-02-02 DIAGNOSIS — M25532 Pain in left wrist: Secondary | ICD-10-CM

## 2022-02-02 DIAGNOSIS — Z789 Other specified health status: Secondary | ICD-10-CM

## 2022-02-02 MED ORDER — DOCUSATE SODIUM 100 MG PO CAPS
100.0000 mg | ORAL_CAPSULE | Freq: Every day | ORAL | Status: DC
  Administered 2022-02-03 – 2022-02-07 (×5): 100 mg via ORAL
  Filled 2022-02-02 (×5): qty 1

## 2022-02-02 MED ORDER — ENOXAPARIN SODIUM 30 MG/0.3ML IJ SOSY
30.0000 mg | PREFILLED_SYRINGE | INTRAMUSCULAR | Status: DC
  Administered 2022-02-02 – 2022-02-08 (×7): 30 mg via SUBCUTANEOUS
  Filled 2022-02-02 (×7): qty 0.3

## 2022-02-02 MED ORDER — TRAMADOL HCL 50 MG PO TABS: 25.0000 mg | ORAL_TABLET | Freq: Four times a day (QID) | ORAL | Status: DC | PRN

## 2022-02-02 NOTE — Progress Notes (Signed)
Patient ID: Mackenzie Madden, female   DOB: Mar 15, 1928, 88 y.o.   MRN: [id number]  Met with pt and granddaughter who is present in her room. They have a family member who works for [employer] and would like them for home health PT & OT. Will reach out to them. Made aware of discharge date of 5/16. ?
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
## 2022-02-02 NOTE — Progress Notes (Signed)
Physical Therapy Session Note ? ?Patient Details  ?Name: KSENIA KUNZ ?MRN: [id number] ?Date of Birth: [birth year] ? ?Today's Date: 02/02/2022 ?PT Individual Time: 9295-7473 ?PT Individual Time Calculation (min): 54 min  ? ?Short Term Goals: ?Week 1:  PT Short Term Goal 1 (Week 1): Pt will complete bed mobility with minA and LRAD ?PT Short Term Goal 2 (Week 1): Pt will complete bed<>chair transfers with minA and LRAD ?PT Short Term Goal 3 (Week 1): Pt will ambulate 67f with minA and LRAD ?PT Short Term Goal 4 (Week 1): Pt will improve BERG balance test by at least 7 points ? ?Skilled Therapeutic Interventions/Progress Updates:  ?   ?Pt greeted supine in bed to start - RN at bedside administering morning rx and granddaughter at bedside as well. Pt agreeable to PT tx and reports mild neck pain - treatment to tolerance and messaged MD to order k-pad for pain management as pt agreeable to this. ? ? Pt fully dressed other than socks/shoes - donned with totalA for time management. Supine<>sitting EOB with HOB elevated with minA for trunk support - pt slow to move and has difficulty scooting to EOB. Allowed time to acclimate to upright at EOB - no dizziness reported (has her prism glasses on). Donned jacket and LUE SLING with totalA for time as well. [facility] with a few failed attempts - cues for increasing forward weight shift and hand placement to bed to assist. Completed ambulatory transfer with minA and HW within her room from EOB to w/c - pt struggles with turning and managing RW to sit down - will benefit from further practice and stimulations.  ? ?Transported to day room rehab gym and completed ambulatory transfer with minA and HW to Nustep where she required seutpA for foot placement. Using BLE and RUE, completed 8 minutes of Nustep with resistance set to 3 - maintained cadence of ~40 steps/minute throughout. Required rest break due to fatigue. ? ? Instructed in gait training where  she ambulated 1239f(!) with CGA and intermittent minA [facility] the [facility] primarily required for frequent and mild LOB to the L. Primary gait deficits include step-to gait pattern, short shuffling steps, decreased step clearance (R > L), downward gaze, narrow BOS, decreased stride length, forward flexed trunk. Pt concluded session seated in w/c with all needs met, granddaughter at bedside, call bell in reach.  ? ?Therapy Documentation ?Precautions:  ?Precautions ?Precautions: Fall ?Precaution Comments: Baseline diplopia ?Required Braces or Orthoses: Splint/Cast ?Splint/Cast: L wrist, sling for comfort ?Restrictions ?Weight Bearing Restrictions: Yes ?LUE Weight Bearing: Non weight bearing ?Other Position/Activity Restrictions: NWB hand and elbow per ortho ?General: ?  ? ?Therapy/Group: Individual Therapy ? ?Alonso Gapinski P Verle Brillhart ?02/02/2022, 7:30 AM  ?

## 2022-02-02 NOTE — Progress Notes (Signed)
Occupational Therapy Session Note ? ?Patient Details  ?Name: Mackenzie Madden ?MRN: [id number] ?Date of Birth: 01-30-1928 ? ?Today's Date: 02/02/2022 ?OT Individual Time: 1840-3754 ?OT Individual Time Calculation (min): 45 min  and Today's Date: 02/02/2022 ?OT Missed Time: 45 Minutes ?Missed Time Reason: Patient fatigue ? ? ?Short Term Goals: ?Week 1:  OT Short Term Goal 1 (Week 1): Pt will donn UB with no more than min A ?OT Short Term Goal 2 (Week 1): Pt will sit > stand with CGA using LRAD in prep for ADL ?OT Short Term Goal 3 (Week 1): Pt will complete 2/3 toileting steps with no more than CGA ? ?Skilled Therapeutic Interventions/Progress Updates:  ?  Subjective: Pt states feeling tired and grand daughter reports she has been up for about 3 hours already.  Pt agreeable to use bathroom then requesting back to bed.  ?  Objective:  Pt sitting up in w/c, sit to stand with min assist using hemi walker ~10 feet to toilet needing cues to keep device closer to body.  Pt completed clothing management with mod assist and toilet transfer using grab bar with mod assist.  Pt had small continence of urine.  Pt completed toilet transfer with mod assist then ambulated back to EOB ~ 10 feet with min assist.  Sit to supine with mod assist.  Instructed pt in gentle AAROM left shoulder FF and scaption, gentle elbow flexion/extension x 10 reps each.  Pt instructed in full composite flexion and extension digits x 20 reps.  Instructed grand daughter to encourage pt in HEP of 10 full flex/ext of digits every hour while awake to prevent joint contractures, soft tissue tightness, and assist in edema reduction of hand.  Grand daughter eager and wrote down and agreeable to helping pt with follow through.   Call bell in reach, bed alarm on.  Gracy Bruins MD Office of Dr. Greta Doom, to get further recommendations on custom splinting and ROM guidelines for wrist and forearm.   ?  Assessment:  Pt making progress evidenced by decreased swelling in  left hand and improved AROM left shoulder after gentle exercise in supine.   ?  Plan: Awaiting further recommendations for Left wrist ROM and splinting needs. ? ?Therapy Documentation ?Precautions:  ?Precautions ?Precautions: Fall ?Precaution Comments: Baseline diplopia ?Required Braces or Orthoses: Splint/Cast ?Splint/Cast: L wrist, sling for comfort ?Restrictions ?Weight Bearing Restrictions: Yes ?LUE Weight Bearing: Non weight bearing ?Other Position/Activity Restrictions: NWB hand and elbow per ortho ? ? ?Therapy/Group: Individual Therapy ? ?Mackenzie Madden ?02/02/2022, 10:47 AM ?

## 2022-02-02 NOTE — Progress Notes (Signed)
Physical Therapy Session Note ? ?Patient Details  ?Name: Mackenzie Madden ?MRN: [id number] ?Date of Birth: [birth year] ? ?Today's Date: 02/02/2022 ?PT Individual Time: 5364-6803 ?PT Individual Time Calculation (min): 82 min  ? ?Short Term Goals: ?Week 1:  PT Short Term Goal 1 (Week 1): Pt will complete bed mobility with minA and LRAD ?PT Short Term Goal 2 (Week 1): Pt will complete bed<>chair transfers with minA and LRAD ?PT Short Term Goal 3 (Week 1): Pt will ambulate 81f with minA and LRAD ?PT Short Term Goal 4 (Week 1): Pt will improve BERG balance test by at least 7 points ? ?Skilled Therapeutic Interventions/Progress Updates: Pt presented in bed with family present agreeable to therapy. Pt denies pain at rest, no pain behaviors during session. Session focused on balance and gait. Pt performed bed mobility with CGA with increased time, multimodal cues for sequencing and HOB elevated. PTA donned shoes total A for time management. Performed ambulatory transfer to w/c with HW and CGA. Pt required cues for hand placement for both sit to stand and stand to sit. Pt transported to rehab gym parallel bars for time management. Pt participated in forward backward ambulation in bars with cues for maintaining hip in neutral vs L hip turning internally. Pt also required cues for increased BOS and step length particularly when stepping backwards. Pt also participated in sidestepping L/R in parallel bars 847fx 4 then 26f67f 4 with red theraband placed around knees. Pt then transferred to high/low mat and participated in toe taps to 2 in step 2 x 10. Pt initially requiring minA due to poor HW management and fear of movement (per pt). With repetitions pt was able to improve to CGA consistently. Then participated in ambulation [id number] stepping over obstacles specifically. Pt required overall minA for activity as pt required cues for HW [id number]cement, facilitation to maintain neutral hips, and cues to step forward over step vs  abducting over step. Pt with no LOB with activity. Pt did require increased seated rests between activities. Pt then transported to 4W nsg station and participated in gait training ~60ft60fbed. Pt performed better when PTA provided cues to step "out" allowing pt to maintain neutral LLE and increasing BOS. Pt left at EOB at end of session per pt request with bed alarm on, call bell within reach and family present.  ? ?   ? ?Therapy Documentation ?Precautions:  ?Precautions ?Precautions: Fall ?Precaution Comments: Baseline diplopia ?Required Braces or Orthoses: Splint/Cast ?Splint/Cast: L wrist, sling for comfort ?Restrictions ?Weight Bearing Restrictions: Yes ?LUE Weight Bearing: Non weight bearing ?Other Position/Activity Restrictions: NWB hand and elbow per ortho ?General: ?  ?Vital Signs: ?Therapy Vitals ?Temp: 98.1 ?F (36.7 ?C) ?Temp Source: Oral ?Pulse Rate: 72 ?Resp: 15 ?BP: 126/61 ?Patient Position (if appropriate): Lying ?Oxygen Therapy ?SpO2: 99 % ?O2 Device: Room Air ?Pain: ?Pain Assessment ?Pain Scale: 0-10 ?Pain Score: 3  ?Pain Type: Acute pain ?Pain Location: Neck ?Pain Orientation: Right;Left ?Pain Descriptors / Indicators: Aching ?Pain Frequency: Intermittent ?Pain Onset: Gradual ?Pain Intervention(s): Medication (See eMAR) ?Mobility: ?  ?Locomotion : ?   ?Trunk/Postural Assessment : ?   ?Balance: ?  ?Exercises: ?  ?Other Treatments:   ? ? ? ?Therapy/Group: Individual Therapy ? ?[name] ?02/02/2022, 3:52 PM  ?

## 2022-02-03 MED ORDER — TRAMADOL HCL 50 MG PO TABS: 25.0000 mg | ORAL_TABLET | Freq: Three times a day (TID) | ORAL | Status: DC | PRN

## 2022-02-03 MED ORDER — MECLIZINE HCL 25 MG PO TABS
12.5000 mg | ORAL_TABLET | Freq: Two times a day (BID) | ORAL | Status: DC
  Administered 2022-02-03 – 2022-02-06 (×6): 12.5 mg via ORAL
  Filled 2022-02-03 (×6): qty 1

## 2022-02-03 NOTE — IPOC Note (Signed)
Overall Plan of Care (IPOC) ?Patient Details ?Name: Mackenzie Madden ?MRN: [id number] ?DOB: 06-28-1928 ? ?Admitting Diagnosis: Distal radius fracture ? ?Hospital Problems: Principal Problem: ?  Distal radius fracture ? ? ? ? Functional Problem List: ?Nursing Bladder, Endurance, Pain, Bowel, Medication Management, Safety, Skin Integrity  ?PT Balance, Endurance, Motor, Pain, Skin Integrity  ?OT Balance, Cognition, Edema, Endurance, Pain, Safety  ?SLP    ?TR    ?    ? Basic ADL?s: ?OT Grooming, Bathing, Dressing, Toileting, Eating  ? ?  Advanced  ADL?s: ?OT    ?   ?Transfers: ?PT Bed Mobility, Bed to Chair, Car  ?OT Toilet, Tub/Shower  ? ?  Locomotion: ?PT Ambulation, Stairs  ? ?  Additional Impairments: ?OT Fuctional Use of Upper Extremity  ?SLP   ?  ?   ?TR    ? ? ?Anticipated Outcomes ?Item Anticipated Outcome  ?Self Feeding Mod I  ?Swallowing ?   ?  ?Basic self-care ? Min A  ?Toileting ? Supervision ?  ?Bathroom Transfers CGA  ?Bowel/Bladder ? manage bowel w mod I and bladder w toileting  ?Transfers ? supervision with LRAD  ?Locomotion ? CGA with LRAD  ?Communication ?    ?Cognition ?    ?Pain ? pain at or below level 4  with prns  ?Safety/Judgment ? Manage safety w cues  ? ?Therapy Plan: ?PT Intensity: Minimum of 1-2 x/day ,45 to 90 minutes ?PT Frequency: 5 out of 7 days ?PT Duration Estimated Length of Stay: 10-14 days ?OT Intensity: Minimum of 1-2 x/day, 45 to 90 minutes ?OT Frequency: 5 out of 7 days ?OT Duration/Estimated Length of Stay: 10-14 days ?   ? ?Due to the current state of emergency, patients may not be receiving their 3-hours of Medicare-mandated therapy. ? ? Team Interventions: ?Nursing Interventions Bladder Management, Disease Management/Prevention, Medication Management, Discharge Planning, Pain Management, Bowel Management, Patient/Family Education, Skin Care/Wound Management  ?PT interventions Ambulation/gait training, Discharge planning, Functional mobility training, Psychosocial support,  Therapeutic Activities, Visual/perceptual remediation/compensation, Wheelchair propulsion/positioning, Therapeutic Exercise, Skin care/wound management, Neuromuscular re-education, Disease management/prevention, Training and development officer, Cognitive remediation/compensation, DME/adaptive equipment instruction, Pain management, Splinting/orthotics, UE/LE Strength taining/ROM, UE/LE Coordination activities, Stair training, Functional electrical stimulation, Community reintegration, Patient/family education  ?OT Interventions Balance/vestibular training, Discharge planning, Pain management, Self Care/advanced ADL retraining, Therapeutic Activities, UE/LE Coordination activities, Cognitive remediation/compensation, Functional mobility training, Disease mangement/prevention, Patient/family education, Skin care/wound managment, Therapeutic Exercise, DME/adaptive equipment instruction, Community reintegration, Splinting/orthotics, UE/LE Strength taining/ROM, Wheelchair propulsion/positioning  ?SLP Interventions    ?TR Interventions    ?SW/CM Interventions Discharge Planning, Psychosocial Support, Patient/Family Education  ? ?Barriers to Discharge ?MD  Medical stability  ?Nursing Decreased caregiver support, Weight bearing restrictions ?1 level ramped entry w spouse; IADLs x showers with assist PTA  ?PT Weight bearing restrictions ?   ?OT Home environment access/layout, Weight bearing restrictions ?   ?SLP   ?   ?SW Insurance for SNF coverage ?   ? ?Team Discharge Planning: ?Destination: PT-Home ,OT- Home , SLP-  ?Projected Follow-up: PT-24 hour supervision/assistance, Home health PT, OT-  24 hour supervision/assistance, Home health OT, SLP-  ?Projected Equipment Needs: PT-To be determined, OT- To be determined, SLP-  ?Equipment Details: PT- , OT-has BSC already ?Patient/family involved in discharge planning: PT- Patient, Family member/caregiver,  OT-Patient, Family member/caregiver, SLP-  ? ?MD ELOS: 12-14 days ?Medical  Rehab Prognosis:  Excellent ?Assessment: The patient has been admitted for CIR therapies with the diagnosis of distal radius and ulna fractures. The team will be  addressing functional mobility, strength, stamina, balance, safety, adaptive techniques and equipment, self-care, bowel and bladder mgt, patient and caregiver education. Goals have been set at supervision. Anticipated discharge destination is home.  ? ?See Team Conference Notes for weekly updates to the plan of care  ?

## 2022-02-03 NOTE — Progress Notes (Signed)
Occupational Therapy Session Note ? ?Patient Details  ?Name: Mackenzie Madden ?MRN: [id number] ?Date of Birth: 1927/10/17 ? ?Today's Date: 02/03/2022 ?Session 1 ?OT Individual Time: 5366-4403 ?OT Individual Time Calculation (min): 73 min  ? ?Session 2 ?OT Individual Time: 4742-5956 ?OT Individual Time Calculation (min): 72 min  ? ? ?Short Term Goals: ?Week 1:  OT Short Term Goal 1 (Week 1): Pt will donn UB with no more than min A ?OT Short Term Goal 2 (Week 1): Pt will sit > stand with CGA using LRAD in prep for ADL ?OT Short Term Goal 3 (Week 1): Pt will complete 2/3 toileting steps with no more than CGA ? ?Skilled Therapeutic Interventions/Progress Updates:  ?Session 1 ?  Session greeted seated in wc and agreeable to OT treatment session. Pt declined bathing/dressing tasks this morning. Pt brought to therapy gym and addressed L UE therapeutic exercises. Brought pt through gentle elbow flex/ext, and gentle AAROM of left shoulder flexion/ex. Focus on full flexion and extension of all digits 3x10. OT issued soft foam to work on grasp/release. OT then placed kinesiotape to L hand for edema management. OT educated on skin care and use of adhesive remover when tape starts to come due to thin skin. Pt completed obstacle course weaving through cones using hemi walker and min A. Pt took seated rest break, then ambulated back to room with min A and multiple standing rest breaks. Pt needed to sit when we got back to the room and perofmed stand-pivot to toilet with CGA usiing grab bars. OT assist to manage clothing with successful void and set-up A for peri-care. Pt transferred back to wc in similar fashion and left seated in wc with family present, call bell in reach, and needs met.  ?Denies pain ? ?Session 2 ?Pt greeted seated in wc with daughter present. Daughter assisted with pt changing into shirt. OT and daughter talked to pt about showering one day. Pt stated " I am 88 years old, don't you think I know how to shower?"  OT and daughter explained patients change in function since fall and need to practice one day in preparation for dc home. Pt adamantly declined today, but will work on attempting this with family prior to dc. Pt brought to therapy gym in wc and addressed functional ambulation and balance within functional kitchen activity locating fruit in upper cabinets and drawers. Pt able to side step along counter with min A from OT and verbal cues for step length and weight shift. Continued working on balance and step length with alternating stepping task on flat disks on floor. Pt fearful of weight shifting and needed facilitation for anterior weight shift. OT then had pt sit and practice seated hip extension to toe tap onto small cones. Progressed alternating toe taps on small cones with min/mod A for balance and facilitation for weight shift. Gentle ROM to L hand/arm with elbow flex/ext, shoulder flex/ext, and all digit flex/ext. Pt returned to room and completed stand-pivot to toilet with CGA and min A for clothing management. Pt voided bladder and small BM. Min A for posterior peri-care. Pt left seated in wc with daughter present, call bell in reach, and needs met.  ? ?Therapy Documentation ?Precautions:  ?Precautions ?Precautions: Fall ?Precaution Comments: Baseline diplopia ?Required Braces or Orthoses: Splint/Cast ?Splint/Cast: L wrist, sling for comfort ?Restrictions ?Weight Bearing Restrictions: Yes ?LUE Weight Bearing: Non weight bearing ?Other Position/Activity Restrictions: NWB hand and elbow per ortho ? ?Pain: ? Denies pain ?Therapy/Group: Individual Therapy ? ?  Daneen Schick Kym Fenter ?02/03/2022, 2:20 PM ?

## 2022-02-03 NOTE — Progress Notes (Signed)
Physical Therapy Session Note ? ?Patient Details  ?Name: Mackenzie Madden ?MRN: [id number] ?Date of Birth: 10-17-1927 ? ?Today's Date: 02/03/2022 ?PT Individual Time: 3614-4315 ?PT Individual Time Calculation (min): 74 min  ? ?Short Term Goals: ?Week 1:  PT Short Term Goal 1 (Week 1): Pt will complete bed mobility with minA and LRAD ?PT Short Term Goal 2 (Week 1): Pt will complete bed<>chair transfers with minA and LRAD ?PT Short Term Goal 3 (Week 1): Pt will ambulate 98f with minA and LRAD ?PT Short Term Goal 4 (Week 1): Pt will improve BERG balance test by at least 7 points ? ?Skilled Therapeutic Interventions/Progress Updates:  ?   ?Pt presenting supine in bed - agreeable to PT tx. Denies pain. Assisted with donning pants at bed level with pt bridging to pull pants over hips. Supine<>sitting with HOB minimally raised, minA for trunk support. Sitting balance with supervision and donned socks/shoes, jacket, and LUE sling with totalA for time management. Sit<>stand with minA and stand<>pivot transfer to w/c with minA and no AD.  ? ?Transported to main rehab gym for time and completed active warm up of 1x20 LAQ and 1x20 hip marches with 2.5# ankle weight. Also completed 1x5 sit<>stands to [facility] and emphasis on hand placement and forward weight shift.  ? ?Instructed in gait training [id number]+ [id number]!!!) with CGA and HW within rehab hallways - she's doing a better job of sequencing with HW Arnaudvilled keeping it wide but close to her while ambulating. Step-through vs step-to gait pattern with short step length bilaterally. Gait speed 0.40m71mndicative of household ambulator and increased risk of falls. We then practiced turning using the HW [facility] gait training - set up 5x5 square with cones and she ambulated around the perimeter of the square with CGA and HW - difficulty with sequencing while turning but benefited from repetition as she improved after the 3rd turn. Continues to require instruction for HW pB[facility] Medical Centercement while  turning to sit to her w/c.  ? ?Returned to her room where she remained seated in w/c with husband at bedside, call bell in reach, all needs met.  ? ?Therapy Documentation ?Precautions:  ?Precautions ?Precautions: Fall ?Precaution Comments: Baseline diplopia ?Required Braces or Orthoses: Splint/Cast ?Splint/Cast: L wrist, sling for comfort ?Restrictions ?Weight Bearing Restrictions: Yes ?LUE Weight Bearing: Non weight bearing ?Other Position/Activity Restrictions: NWB hand and elbow per ortho ?General: ?  ? ?Therapy/Group: Individual Therapy ? ?[name] ?02/03/2022, 7:24 AM  ?

## 2022-02-03 NOTE — Progress Notes (Signed)
?                                                       PROGRESS NOTE ? ? ?Subjective/Complaints: ?No new complaints this morning ?Denies pain, has only been using tylenol sparingly ?Ambulating 120 feet! ? ?ROS: +burning pain in left hand at times, constipation improved but stools still small  ? ? ?Objective: ?  ?No results found. ?Recent Labs  ?  01/31/22 ?1647 02/01/22 ?9924  ?WBC 11.7* 10.4  ?HGB 11.2* 10.4*  ?HCT 33.2* 30.3*  ?PLT 208 215  ? ?Recent Labs  ?  01/31/22 ?1647 02/01/22 ?2683  ?NA  --  133*  ?K  --  3.9  ?CL  --  102  ?CO2  --  24  ?GLUCOSE  --  98  ?BUN  --  12  ?CREATININE 0.75 0.70  ?CALCIUM  --  8.9  ? ? ?Intake/Output Summary (Last 24 hours) at 02/03/2022 0926 ?Last data filed at 02/02/2022 1859 ?Gross per 24 hour  ?Intake 295 ml  ?Output --  ?Net 295 ml  ? ?  ? ?  ? ?Physical Exam: ?Vital Signs ?Blood pressure 135/60, pulse 72, temperature 97.8 ?F (36.6 ?C), resp. rate 16, height 5' (1.524 m), weight 46.9 kg, SpO2 98 %. ?Gen: no distress, normal appearing, BMI 20.19 ?HEENT: oral mucosa pink and moist, NCAT ?Cardio: Reg rate ?Pulmonary:  ?   Effort: Pulmonary effort is normal. No respiratory distress.  ?   Breath sounds: Normal breath sounds. No wheezing, rhonchi or rales.  ?Abdominal:  ?   Comments: Appears somewhat distended, but just ate; NT; soft; hypoactive BS  ?Musculoskeletal:  ?   Cervical back: Neck supple. No tenderness.  ?   Comments: UE strength- RUE 5-/5 ?LUE -at least 3/5 in biceps/triceps/grip and FA-  ?ACE wrap on forearm to wrist ?RLE- HF 4+/5 and otherwise 5-/5 ?LLE- HF 4+/5; otherwise 5-/5 ?Ambulating Min A ?Skin: ?   General: Skin is warm and dry.  ?   Comments: Trace L hand/finger swelling ?Lots of bruising in abdomen from shots and arms B/L ?Neurological:  ?   Mental Status: She is alert and oriented to person, place, and time.  ?   Comments: Patient is alert.  Makes eye contact with examiner.  Follows simple commands. ?Intact to light touch in all 4 extremities  ?Psychiatric:      ?   Behavior: Behavior normal.  ?   Comments: Positive affect ? ? ?Assessment/Plan: ?1. Functional deficits which require 3+ hours per day of interdisciplinary therapy in a comprehensive inpatient rehab setting. ?Physiatrist is providing close team supervision and 24 hour management of active medical problems listed below. ?Physiatrist and rehab team continue to assess barriers to discharge/monitor patient progress toward functional and medical goals ? ?Care Tool: ? ?Bathing ?   ?Body parts bathed by patient: Left arm, Chest, Abdomen, Right upper leg, Left upper leg, Face, Front perineal area, Buttocks  ? Body parts bathed by helper: Right lower leg, Left lower leg, Right arm ?  ?  ?Bathing assist Assist Level: Moderate Assistance - Patient 50 - 74% ?  ?  ?Upper Body Dressing/Undressing ?Upper body dressing   ?What is the patient wearing?: Pull over shirt ?   ?Upper body assist Assist Level: Moderate Assistance - Patient 50 - 74% ?   ?  Lower Body Dressing/Undressing ?Lower body dressing ? ? ?   ?What is the patient wearing?: Incontinence brief, Pants ? ?  ? ?Lower body assist Assist for lower body dressing: Maximal Assistance - Patient 25 - 49% ?   ? ?Toileting ?Toileting Toileting Activity did not occur (Probation officer and hygiene only): N/A (no void or bm)  ?Toileting assist Assist for toileting: Maximal Assistance - Patient 25 - 49% ?  ?  ?Transfers ?Chair/bed transfer ? ?Transfers assist ?   ? ?Chair/bed transfer assist level: Moderate Assistance - Patient 50 - 74% ?  ?  ?Locomotion ?Ambulation ? ? ?Ambulation assist ? ?   ? ?Assist level: Moderate Assistance - Patient 50 - 74% ?Assistive device: Walker-hemi ?Max distance: 40f  ? ?Walk 10 feet activity ? ? ?Assist ?   ? ?Assist level: Moderate Assistance - Patient - 50 - 74% ?Assistive device: Walker-hemi  ? ?Walk 50 feet activity ? ? ?Assist Walk 50 feet with 2 turns activity did not occur: Safety/medical concerns ? ?  ?   ? ? ?Walk 150 feet  activity ? ? ?Assist Walk 150 feet activity did not occur: Safety/medical concerns ? ?  ?  ?  ? ?Walk 10 feet on uneven surface  ?activity ? ? ?Assist Walk 10 feet on uneven surfaces activity did not occur: Safety/medical concerns ? ? ?  ?   ? ?Wheelchair ? ? ? ? ?Assist Is the patient using a wheelchair?: Yes ?Type of Wheelchair: Manual ?  ? ?Wheelchair assist level: Dependent - Patient 0% ?Max wheelchair distance: 1519f ? ? ?Wheelchair 50 feet with 2 turns activity ? ? ? ?Assist ? ?  ?  ? ? ?Assist Level: Dependent - Patient 0%  ? ?Wheelchair 150 feet activity  ? ? ? ?Assist ?   ? ? ?Assist Level: Dependent - Patient 0%  ? ?Blood pressure 135/60, pulse 72, temperature 97.8 ?F (36.6 ?C), resp. rate 16, height 5' (1.524 m), weight 46.9 kg, SpO2 98 %. ? ? ?Medical Problem List and Plan: ?1. Functional deficits secondary to comminuted distal angulated distal radius fracture with intra-articular extension.  Displaced angulated distal ulna fracture.  Status post distal ORIF, left distal ulnar ORIF, left wrist brachial radialis tenotomy 01/24/2022 per Dr. SpGreta Doom Nonweightbearing. ?            -patient may  shower if cover L forearm/ACE wrap ?            -ELOS/Goals: 12-14 days supervision ? -Discussing range of motion restrictions with ortho ?2.  Impaired mobility: change Heparin to Lovenox. Ask ortho if can switch to aspirin once ambulating 150 feet, currently at 120 ?3. Pain from fractured: continue Tylenol PRN for mild pain. Decrease Tramadol to '25mg'$  q8H prn for moderate/severe pain as needed ?4. Mood: Provide emotional support ?            -antipsychotic agents: N/A ?5. Neuropsych: This patient is capable of making decisions on her own behalf. ?6. Skin/Wound Care: Routine skin checks ?7. Fluids/Electrolytes/Nutrition: Routine in and outs with follow-up chemistries ?8.  Asymptomatic transaminitis.  Nonspecific ultrasound.  ?9.  E. coli UTI.  Keflex completed ?10.  Vertigo.  CT MRI unremarkable.  Antivert added. ?11.   Hypertension.  Norvasc 5 mg daily.  Monitor with increased mobility ?12.  Glaucoma.  Continue eyedrops as directed ?13.  Hypothyroidism.  Synthroid ?14.  Constipation.  MiraLAX daily. Decrease colace to '100mg'$  daily.  ?15. Poor balance, fear of falling, general deconditioning: continue  PT ?16. Dizziness: improved, decrease meclizine to 12.'5mg'$  BID ? ?LOS: ?3 days ?A FACE TO FACE EVALUATION WAS PERFORMED ? ?Martha Clan P Quentyn Kolbeck ?02/03/2022, 9:26 AM  ? ?  ?

## 2022-02-03 NOTE — Progress Notes (Signed)
The patient was examined and is postop day 10 left open distal radius I&D and ORIF as well as open reduction internal fixation of the distal ulna.  Her splint had been removed and rewrapped and she had some Kinesiotape over the fingers.  I removed the tape as I would prefer to not have this sort of adhesive on her very thin and fragile skin.  I did remove her dressing completely assess the incisions and they are healing well there is no erythema or drainage.  She does have significant ecchymosis present however everything is healing beautifully.  She does have some stiffness of the digits however she is able to perform active range of motion in terms of both flexion and extension and she has intact sensation to light touch with brisk capillary refill.  She has limited wrist range of motion as expected.  A new bandage was applied and short arm splint.   I would prefer nonweightbearing of the wrist and elbow as she does have this associated ulna fracture which would preclude forearm weightbearing.  This would likely be present for 6 to 8 weeks from the surgical date..  She needs to continue to work on finger range of motion and elevation is much as possible as this will be the primary predictor of her outcome in the end.  She understands this and discussed with her and her daughter today and they expressed good understanding.  The patient will follow-up with me on 17 May for follow-up appointment which is already been set up. ? ?J. Sable Feil, MD ?Orthopaedic Hand Surgeon ?[facility] ?Office number: [phone] ?[address] ?[place], [place] [address] ? ?

## 2022-02-04 DIAGNOSIS — I10 Essential (primary) hypertension: Secondary | ICD-10-CM

## 2022-02-04 DIAGNOSIS — A499 Bacterial infection, unspecified: Secondary | ICD-10-CM

## 2022-02-04 DIAGNOSIS — N390 Urinary tract infection, site not specified: Secondary | ICD-10-CM

## 2022-02-04 DIAGNOSIS — S52502S Unspecified fracture of the lower end of left radius, sequela: Secondary | ICD-10-CM

## 2022-02-04 DIAGNOSIS — K5901 Slow transit constipation: Secondary | ICD-10-CM

## 2022-02-04 NOTE — Progress Notes (Signed)
Occupational Therapy Session Note ? ?Patient Details  ?Name: Mackenzie Madden ?MRN: [id number] ?Date of Birth: 02-Jul-1928 ? ?Today's Date: 02/04/2022 ?OT Individual Time: 3086-5784 ?OT Individual Time Calculation (min): 54 min  ? ? ?Short Term Goals: ?Week 1:  OT Short Term Goal 1 (Week 1): Pt will donn UB with no more than min A ?OT Short Term Goal 2 (Week 1): Pt will sit > stand with CGA using LRAD in prep for ADL ?OT Short Term Goal 3 (Week 1): Pt will complete 2/3 toileting steps with no more than CGA ? ?Skilled Therapeutic Interventions/Progress Updates:  ?  Pt received seated in w/c with family present, denies pain, agreeable to therapy. Session focus on L digit AROM in prep for improved ADL/IADL/func mobility performance + decreased caregiver burden. ? ?Declined need for ADL and already dressed for the day. Total A w/c transport to and form gym and short ambulatory transfer throughout with CGA and use of HW.  ? ?Pt participated in various [facility] to target L digit AROM including threading small to large beads, placing pegs in peg design, flipping and shuffling deck of cards, and tossing bean bags into bucket. Close S for balance throughout and pt reports improved use of L digits, decreased edema, and improved ability to complete composite flexion overall compared to several day sago. ? ?Pt left seated in w/c with LUE elevated with family  call bell in reach, and all immediate needs met.  ? ? ?Therapy Documentation ?Precautions:  ?Precautions ?Precautions: Fall ?Precaution Comments: Baseline diplopia ?Required Braces or Orthoses: Splint/Cast ?Splint/Cast: L wrist, sling for comfort ?Restrictions ?Weight Bearing Restrictions: Yes ?LUE Weight Bearing: Non weight bearing ?Other Position/Activity Restrictions: NWB hand and elbow per ortho ? ?Pain: denies ?  ?ADL: See Care Tool for more details. ? ? ?Therapy/Group: Individual Therapy ? ?Volanda Napoleon MS, OTR/L ? ?02/04/2022, 7:00 AM ?

## 2022-02-04 NOTE — Plan of Care (Signed)
?  Problem: RH BLADDER ELIMINATION ?Goal: RH STG MANAGE BLADDER WITH ASSISTANCE ?Description: STG Manage Bladder With toileting Assistance ?Outcome: Progressing; time toileting ?  ?

## 2022-02-04 NOTE — Progress Notes (Signed)
?                                                       PROGRESS NOTE ? ? ?Subjective/Complaints: ?Patient sitting in bed eating breakfast.  Family at bedside.  She reports that she is comfortable.  Wants to get out of splint on left upper extremity explanation point ? ?ROS: Patient denies fever, rash, sore throat, blurred vision, dizziness, nausea, vomiting, diarrhea, cough, shortness of breath or chest pain, joint or back/neck pain, headache, or mood change.  ? ?Objective: ?  ?No results found. ?No results for input(s): WBC, HGB, HCT, PLT in the last 72 hours. ? ?No results for input(s): NA, K, CL, CO2, GLUCOSE, BUN, CREATININE, CALCIUM in the last 72 hours. ? ? ?Intake/Output Summary (Last 24 hours) at 02/04/2022 1043 ?Last data filed at 02/04/2022 0700 ?Gross per 24 hour  ?Intake 460 ml  ?Output --  ?Net 460 ml  ? ?  ? ?  ? ?Physical Exam: ?Vital Signs ?Blood pressure 135/69, pulse 71, temperature 98.3 ?F (36.8 ?C), resp. rate 16, height 5' (1.524 m), weight 46.9 kg, SpO2 98 %. ?Constitutional: No distress . Vital signs reviewed. ?HEENT: NCAT, EOMI, oral membranes moist ?Neck: supple ?Cardiovascular: RRR without murmur. No JVD    ?Respiratory/Chest: CTA Bilaterally without wheezes or rales. Normal effort    ?GI/Abdomen: BS +, non-tender, non-distended ?Ext: no clubbing, cyanosis  ?Psych: pleasant and cooperative  ?Musculoskeletal:  ?   Cervical back: Neck supple. No tenderness.  ?   Comments: UE strength- RUE 5-/5 ?LUE -at least 3/5 in biceps/triceps/grip and FA-  ?ACE wrap on forearm to wrist ?RLE- HF 4+/5 and otherwise 5-/5 ?LLE- HF 4+/5; otherwise 5-/5 ?Motor exam stable ?Skin: ?   General: Skin is warm and dry.  ?   Comments: Trace L hand/finger swelling but improved per family ?Lots of bruising in abdomen from shots and arms B/L ?Neurological:  ?   Mental Status: She is alert and oriented to person, place, and time.  ?   Comments: Patient is alert.  Makes eye contact with examiner.  Follows simple commands.   She is hard of hearing but otherwise appears appropriate ?Intact to light touch in all 4 extremities  ?  ? ? ?Assessment/Plan: ?1. Functional deficits which require 3+ hours per day of interdisciplinary therapy in a comprehensive inpatient rehab setting. ?Physiatrist is providing close team supervision and 24 hour management of active medical problems listed below. ?Physiatrist and rehab team continue to assess barriers to discharge/monitor patient progress toward functional and medical goals ? ?Care Tool: ? ?Bathing ?   ?Body parts bathed by patient: Left arm, Chest, Abdomen, Right upper leg, Left upper leg, Face, Front perineal area, Buttocks  ? Body parts bathed by helper: Right lower leg, Left lower leg, Right arm ?  ?  ?Bathing assist Assist Level: Moderate Assistance - Patient 50 - 74% ?  ?  ?Upper Body Dressing/Undressing ?Upper body dressing   ?What is the patient wearing?: Pull over shirt ?   ?Upper body assist Assist Level: Moderate Assistance - Patient 50 - 74% ?   ?Lower Body Dressing/Undressing ?Lower body dressing ? ? ?   ?What is the patient wearing?: Incontinence brief, Pants ? ?  ? ?Lower body assist Assist for lower body dressing: Maximal Assistance - Patient 25 -  49% ?   ? ?Toileting ?Toileting Toileting Activity did not occur (Probation officer and hygiene only): N/A (no void or bm)  ?Toileting assist Assist for toileting: Maximal Assistance - Patient 25 - 49% ?  ?  ?Transfers ?Chair/bed transfer ? ?Transfers assist ?   ? ?Chair/bed transfer assist level: Moderate Assistance - Patient 50 - 74% ?  ?  ?Locomotion ?Ambulation ? ? ?Ambulation assist ? ?   ? ?Assist level: Moderate Assistance - Patient 50 - 74% ?Assistive device: Walker-hemi ?Max distance: 39f  ? ?Walk 10 feet activity ? ? ?Assist ?   ? ?Assist level: Moderate Assistance - Patient - 50 - 74% ?Assistive device: Walker-hemi  ? ?Walk 50 feet activity ? ? ?Assist Walk 50 feet with 2 turns activity did not occur: Safety/medical  concerns ? ?  ?   ? ? ?Walk 150 feet activity ? ? ?Assist Walk 150 feet activity did not occur: Safety/medical concerns ? ?  ?  ?  ? ?Walk 10 feet on uneven surface  ?activity ? ? ?Assist Walk 10 feet on uneven surfaces activity did not occur: Safety/medical concerns ? ? ?  ?   ? ?Wheelchair ? ? ? ? ?Assist Is the patient using a wheelchair?: Yes ?Type of Wheelchair: Manual ?  ? ?Wheelchair assist level: Dependent - Patient 0% ?Max wheelchair distance: 1575f ? ? ?Wheelchair 50 feet with 2 turns activity ? ? ? ?Assist ? ?  ?  ? ? ?Assist Level: Dependent - Patient 0%  ? ?Wheelchair 150 feet activity  ? ? ? ?Assist ?   ? ? ?Assist Level: Dependent - Patient 0%  ? ?Blood pressure 135/69, pulse 71, temperature 98.3 ?F (36.8 ?C), resp. rate 16, height 5' (1.524 m), weight 46.9 kg, SpO2 98 %. ? ? ?Medical Problem List and Plan: ?1. Functional deficits secondary to comminuted distal angulated distal radius fracture with intra-articular extension.  Displaced angulated distal ulna fracture.  Status post distal ORIF, left distal ulnar ORIF, left wrist brachial radialis tenotomy 01/24/2022 per Dr. SpGreta Doom Nonweightbearing. ?            -patient may  shower if cover L forearm/ACE wrap ?            -ELOS/Goals: 12-14 days supervision ? --Continue CIR therapies including PT, OT  ?2.  Impaired mobility: change Heparin to Lovenox. Ask ortho if can switch to aspirin once ambulating 150 feet, currently at 120 ?3. Pain from fractured: continue Tylenol PRN for mild pain. Decrease Tramadol to '25mg'$  q8H prn for moderate/severe pain as needed ?4. Mood: Provide emotional support ?            -antipsychotic agents: N/A ?5. Neuropsych: This patient is capable of making decisions on her own behalf. ?6. Skin/Wound Care: Routine skin checks ?7. Fluids/Electrolytes/Nutrition: Routine in and outs with follow-up chemistries ?8.  Asymptomatic transaminitis.  Nonspecific ultrasound.  ?9.  E. coli UTI.  Keflex completed--denies further symptoms ?10.   Vertigo.  CT MRI unremarkable.  Antivert added. ?11.  Hypertension.  Norvasc 5 mg daily.  Monitor with increased mobility ? -Well-controlled at present ?12.  Glaucoma.  Continue eyedrops as directed ?13.  Hypothyroidism.  Synthroid ?14.  Constipation.  MiraLAX daily. Decrease colace to '100mg'$  daily.  ? -Formed BM on 5/5 ?15. Poor balance, fear of falling, general deconditioning: continue PT ?16. Dizziness: improved, decrease meclizine to 12.'5mg'$  BID ? ?LOS: ?4 days ?A FACE TO FACE EVALUATION WAS PERFORMED ? ?ZaMeredith Staggers5/03/2022, 10:43  AM  ? ?  ?

## 2022-02-05 NOTE — Progress Notes (Signed)
Physical Therapy Session Note ? ?Patient Details  ?Name: Mackenzie Madden ?MRN: [id number] ?Date of Birth: 12-04-1927 ? ?Today's Date: 02/05/2022 ?PT Individual Time: 1761-6073 ?PT Individual Time Calculation (min): 30 min  ? ?Short Term Goals: ?Week 1:  PT Short Term Goal 1 (Week 1): Pt will complete bed mobility with minA and LRAD ?PT Short Term Goal 2 (Week 1): Pt will complete bed<>chair transfers with minA and LRAD ?PT Short Term Goal 3 (Week 1): Pt will ambulate 53f with minA and LRAD ?PT Short Term Goal 4 (Week 1): Pt will improve BERG balance test by at least 7 points ? ?Skilled Therapeutic Interventions/Progress Updates:  ?  Pt received seated in w/c in room, agreeable to PT session. No complaints of pain. Sit to stand with [facility] to [facility] stand pivot transfer w/c to mat table with HW and CGA. Standing alt L/R 4" step-taps with HW and min A for balance. Progressing to alt L/R cone taps with HW and min A for balance, increased time needed to recover balance between steps as well as cues needed to widen BOS. Ambulation x 150 ft with HW and min A for balance, increased time needed to complete distance. Pt exhibits flexed head and trunk during gait with cues needed for upright posture. Pt also varies between step-to and step-through gait pattern as well as exhibits shuffling of steps when making turns or navigating obstacles. Pt requests to return to bed at end of session. Stand pivot transfer w/c to bed with HW and CGA. Sit to supine CGA for balance. Pt left seated in bed with needs in reach, bed alarm in place, family present. ? ?Therapy Documentation ?Precautions:  ?Precautions ?Precautions: Fall ?Precaution Comments: Baseline diplopia ?Required Braces or Orthoses: Splint/Cast ?Splint/Cast: L wrist, sling for comfort ?Restrictions ?Weight Bearing Restrictions: Yes ?LUE Weight Bearing: Non weight bearing ?Other Position/Activity Restrictions: NWB hand and elbow per ortho ? ? ? ? ? ?Therapy/Group: Individual  Therapy ? ? ?TExcell Seltzer PT, DPT, CSRS ?02/05/2022, 3:38 PM  ?

## 2022-02-05 NOTE — Progress Notes (Signed)
Physical Therapy Session Note ? ?Patient Details  ?Name: Mackenzie Madden ?MRN: [id number] ?Date of Birth: 1928-07-07 ? ?Today's Date: 02/05/2022 ?PT Individual Time: 1497-0263 ?PT Individual Time Calculation (min): 72 min  ? ?Short Term Goals: ?Week 1:  PT Short Term Goal 1 (Week 1): Pt will complete bed mobility with minA and LRAD ?PT Short Term Goal 2 (Week 1): Pt will complete bed<>chair transfers with minA and LRAD ?PT Short Term Goal 3 (Week 1): Pt will ambulate 59f with minA and LRAD ?PT Short Term Goal 4 (Week 1): Pt will improve BERG balance test by at least 7 points ? ? ?Skilled Therapeutic Interventions/Progress Updates:  ? Pt presents in bed and requesting to use bathroom. Engaged in bed mobilty with HOB elevated with supervision and extra time. Occasional cues for reminders not to use LUE to push. CGA for stand step transfer to the [facility] Min assist standing balance for clothing management and hygiene (husband assisted her with clothing). Total assist for hygiene. Using [facility] to w/c a couple more feet with CGA and cues for upright posture.  ? ?Administered TUG (avg 3 trials for fall risk assessment) see results below. ? ?Transfers throughout session at overall CGA for balance and cues for technique.  ? ?Instructed in standing therex for functional strenthening and balance retraining including standing hip abducting, standing hamstring curls, heel raises, toe raises, and mini squats x 10 reps each BLE. Cues for technique and posture. ? ?In ADL apartment simulated household mobility on carpeted surface, floor changes, and transferred in/out of recliner. Pt performed gait with CGA overall around 50' with cues for posture, step length, and safe positioning of HW during turns and transitions. Pt performed transfer with min assist due to lower and unstable surface. Discussed home set up as well. ? ? ? ? ?Therapy Documentation ?Precautions:  ?Precautions ?Precautions: Fall ?Precaution Comments:  Baseline diplopia ?Required Braces or Orthoses: Splint/Cast ?Splint/Cast: L wrist, sling for comfort ?Restrictions ?Weight Bearing Restrictions: Yes ?LUE Weight Bearing: Non weight bearing ?Other Position/Activity Restrictions: NWB hand and elbow per ortho ? ?Pain: ? Denies pain ?   ?Balance: ?Standardized Balance Assessment ?Standardized Balance Assessment: Timed Up and Go Test ?Timed Up and Go Test ?TUG: Normal TUG (with HW) ?Normal TUG (seconds): 114.33 (avg 3 trials (129 sec, 113 sec and 101 sec)) ? ? ? ? ?Therapy/Group: Individual Therapy ? ?GAllayne Gitelman?ALars Masson PT, DPT, CBIS ? ?02/05/2022, 8:45 AM  ?

## 2022-02-05 NOTE — Progress Notes (Signed)
Occupational Therapy Session Note ? ?Patient Details  ?Name: Mackenzie Madden ?MRN: [id number] ?Date of Birth: [birth year] ? ?Today's Date: 02/05/2022 ?OT Individual Time: 7416-3845; 3646-8032 ?OT Individual Time Calculation (min): 53 min and 53 min ? ? ?Short Term Goals: ?Week 1:  OT Short Term Goal 1 (Week 1): Pt will donn UB with no more than min A ?OT Short Term Goal 2 (Week 1): Pt will sit > stand with CGA using LRAD in prep for ADL ?OT Short Term Goal 3 (Week 1): Pt will complete 2/3 toileting steps with no more than CGA ? ?Skilled Therapeutic Interventions/Progress Updates:  ?  First session: Pt sitting up in w/c, family at bedside. She requested to use bathroom.  Ambulated to toilet using hemiwalker with CGA.  Toilet transfer and pericare completed with min assist.  Pt ambulated to sink and washed hands with CGA.  Stand to sit at w/c with CGA then transported to dayroom.  PROM to left digits including thumb to facilitate composite flexion and extension.  Achieved WNL PROM today.  Left thumb AROM IP flexion limited to ~10 degrees therefore completed composite flexion and IP blocking 3 x 10 each.  Pt also completed thumb opposition to each digit x 10 reps.  Provided pt with table top activities including dealing deck of cards and grasping small light cylindrical items to encourage flexion of thumb.  Also used weightless dowel to complete elbow flex/ext and shoulder flex/ext 3 x 10 reps.  Returned to room, call bell in reach, seat alarm on. ? ?Second session:  Pt sitting up in w/c, reports feeling tired but with encouragement from family and therapist, agreeable to going to the dayroom to work on functional transfer and standing balance training.  Transported pt to dayroom via w/c.  Stand pivot to EOM with CGA.  Pt completed card matching, bean bag toss, and horseshoes in standing with CGA and items placed left and right side of pt to encourage reaching and leaning to edge of BOS in multidirections.  Pt  requesting to use bathroom.  Stand pivot to w/c with CGA.  Returned to room and pt ambulated ~8 feet to toilet with hemiwalker needing CGA and tactile cues to shift weight in order to increase step length.  Pt had small bowel smear and needed max assist to clean thoroughly.  Clothing mangement with mod assist to change brief and pull over hips.  Ambulated to sink where she rinsed her hands and then stand to sit at w/c with CGA.  Call bell in reach, family in room, PT scheduled to come in less than 5 minutes. ? ?Therapy Documentation ?Precautions:  ?Precautions ?Precautions: Fall ?Precaution Comments: Baseline diplopia ?Required Braces or Orthoses: Splint/Cast ?Splint/Cast: L wrist, sling for comfort ?Restrictions ?Weight Bearing Restrictions: Yes ?LUE Weight Bearing: Non weight bearing ?Other Position/Activity Restrictions: NWB hand and elbow per ortho ? ? ? ? ?Therapy/Group: Individual Therapy ? ?Caryl Asp Lyris Hitchman ?02/05/2022, 4:11 PM ?

## 2022-02-05 NOTE — Progress Notes (Signed)
2 smears in brief, then up to [facility] with continent mushy stool. Continent of urine. Husband at bedside. No PRN meds given on this shift.Mackenzie Madden  ?

## 2022-02-05 NOTE — Progress Notes (Signed)
Patient c/o dry eyes, would like lubricating drops if possible.  ?

## 2022-02-06 MED ORDER — CEPHALEXIN 250 MG PO CAPS: 500.0000 mg | ORAL_CAPSULE | Freq: Two times a day (BID) | ORAL | Status: DC

## 2022-02-06 MED ORDER — SULFAMETHOXAZOLE-TRIMETHOPRIM 800-160 MG PO TABS
1.0000 | ORAL_TABLET | Freq: Two times a day (BID) | ORAL | Status: DC
  Administered 2022-02-06 – 2022-02-08 (×4): 1 via ORAL
  Filled 2022-02-06 (×4): qty 1

## 2022-02-06 MED ORDER — AMLODIPINE BESYLATE 2.5 MG PO TABS
2.5000 mg | ORAL_TABLET | Freq: Every day | ORAL | Status: DC
Start: 2022-02-07 — End: 2022-02-07
  Administered 2022-02-07: 2.5 mg via ORAL
  Filled 2022-02-06: qty 1

## 2022-02-06 MED ORDER — MECLIZINE HCL 25 MG PO TABS
12.5000 mg | ORAL_TABLET | Freq: Every day | ORAL | Status: DC
  Administered 2022-02-07 – 2022-02-08 (×2): 12.5 mg via ORAL
  Filled 2022-02-06 (×2): qty 1

## 2022-02-06 MED ORDER — LIDOCAINE HCL URETHRAL/MUCOSAL 2 % EX GEL
1.0000 "application " | Freq: Once | CUTANEOUS | Status: AC
  Administered 2022-02-06: 1 via TOPICAL
  Filled 2022-02-06: qty 6

## 2022-02-06 NOTE — Progress Notes (Signed)
Removed sutures from Left anterior and posterior lateral wrist incision. PRN tylenol and topical lidocaine gel applied for comfort. 3 sutures on anterior and 2 on lateral remain in place due to increased pain and discomfort. Will attempt to remove tomorrow.  ? ?Gerald Stabs, RN ?  ?

## 2022-02-06 NOTE — Discharge Summary (Signed)
Physician Discharge Summary  ?Patient ID: ?Mackenzie Madden ?MRN: [id number] ?DOB/AGE: [birth year] 88 y.o. ? ?Admit date: 01/31/2022 ?Discharge date: 02/08/2022 ? ?Discharge Diagnoses:  ?Principal Problem: ?  Distal radius fracture ?DVT prophylaxis ?Asymptomatic transaminitis ?E. coli UTI ?Hypertension ?Glaucoma ?Hypothyroidism ?Constipation ? ?Discharged Condition: Stable ? ?Significant Diagnostic Studies: ?DG Wrist 2 Views Left ? ?Result Date: 01/23/2022 ?CLINICAL DATA:  Fall while ambulating with walker at home. Left wrist swelling and deformity. EXAM: LEFT WRIST - 2 VIEW COMPARISON:  None. FINDINGS: Comminuted and displaced distal radius fracture primarily involving the metaphysis. There is apex radial angulation. Large amount of air in the soft tissues with skin irregularity and possible exposed bone suggesting open fracture. Fracture extends into the distal radioulnar joint. There is an oblique displaced fracture of the distal ulna with distal fracture fragments displaced about the volar aspect. The carpal bones remain aligned with the distal radius fracture fragment. 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 Comminuted, displaced and angulated distal radius fracture with intra-articular extension. Large amount of air in the soft tissues, skin irregularity with possible exposed bone suggesting open fracture. 2. Displaced angulated distal ulna fracture. Electronically Signed   By: Keith Rake M.D.   On: 01/23/2022 20:49  ? ?DG Wrist Complete Right ? ?Result Date: 01/23/2022 ?CLINICAL DATA:  Fall while ambulating with walker. Right wrist swelling. EXAM: RIGHT WRIST - COMPLETE 3+ VIEW COMPARISON:  None. FINDINGS: There is no evidence of fracture or dislocation. Cystic changes in the proximal lunate. There is chondrocalcinosis of the triangular fibrocartilage. Soft tissues are unremarkable. IMPRESSION: 1. No fracture or subluxation of the right wrist. 2. Chondrocalcinosis of the  triangular fibrocartilage. Electronically Signed   By: Keith Rake M.D.   On: 01/23/2022 20:50  ? ?CT Head Wo Contrast ? ?Result Date: 01/23/2022 ?CLINICAL DATA:  Head trauma, moderate-severe.  Fall. EXAM: CT HEAD WITHOUT CONTRAST TECHNIQUE: Contiguous axial images were obtained from the base of the skull through the vertex without intravenous contrast. RADIATION DOSE REDUCTION: This exam was performed according to the departmental dose-optimization program which includes automated exposure control, adjustment of the mA and/or kV according to patient size and/or use of iterative reconstruction technique. COMPARISON:  10/27/2018 FINDINGS: Brain: There is atrophy and chronic small vessel disease changes. No acute intracranial abnormality. Specifically, no hemorrhage, hydrocephalus, mass lesion, acute infarction, or significant intracranial injury. Vascular: No hyperdense vessel or unexpected calcification. Skull: No acute calvarial abnormality. Sinuses/Orbits: No acute findings Other: None IMPRESSION: Atrophy, chronic microvascular disease. No acute intracranial abnormality. Electronically Signed   By: Rolm Baptise M.D.   On: 01/23/2022 20:27  ? ?MR BRAIN WO CONTRAST ? ?Result Date: 01/24/2022 ?CLINICAL DATA:  Dizziness.  Recent fall EXAM: MRI HEAD WITHOUT CONTRAST TECHNIQUE: Multiplanar, multiecho pulse sequences of the brain and surrounding structures were obtained without intravenous contrast. COMPARISON:  CT head 01/23/2022.  MRI head 07/29/2016 FINDINGS: Brain: Moderate atrophy and moderate chronic microvascular ischemic change with progression since 2017. Mild chronic microvascular ischemic change in the pons. Negative for acute infarct or mass. Chronic microhemorrhage in the right occipital lobe and right temporal lobe, new since 2017 Vascular: Normal arterial flow voids at the skull base Skull and upper cervical spine: No focal skeletal lesion. Sinuses/Orbits: Paranasal sinuses clear. Bilateral cataract  extraction Other: None IMPRESSION: Negative for acute infarct Progressive atrophy and chronic microvascular ischemia. Progressive chronic microhemorrhage right temporal lobe and right occipital lobe since 2017. Electronically Signed   By: Franchot Gallo M.D.   On: 01/24/2022 14:19  ? ?  DG Chest Portable 1 View ? ?Result Date: 01/23/2022 ?CLINICAL DATA:  Fall while ambulating with walker. EXAM: PORTABLE CHEST 1 VIEW COMPARISON:  01/02/2022 FINDINGS: Stable mild cardiomegaly. Unchanged mediastinal contours. Aortic atherosclerosis. No pneumothorax or large pleural effusion. No focal airspace disease. Right posterior seventh and eighth rib fractures are remote and were present on prior exam. The bones are under mineralized. IMPRESSION: Stable mild cardiomegaly. No evidence of acute traumatic injury to the chest. Electronically Signed   By: Keith Rake M.D.   On: 01/23/2022 20:47  ? ?DG MINI C-ARM IMAGE ONLY ? ?Result Date: 01/24/2022 ?There is no interpretation for this exam.  This order is for images obtained during a surgical procedure.  Please See "Surgeries" Tab for more information regarding the procedure.  ? ?US Abdomen Limited RUQ (LIVER/GB) ? ?Result Date: 01/25/2022 ?CLINICAL DATA:  Transaminitis EXAM: ULTRASOUND ABDOMEN LIMITED RIGHT UPPER QUADRANT COMPARISON:  None. FINDINGS: Gallbladder: Status post cholecystectomy. Common bile duct: Diameter: Severely dilated common bile duct to the ampulla, measuring 2.6 cm in caliber. Intrahepatic biliary ductal dilatation. Liver: No focal lesion identified. Within normal limits in parenchymal echogenicity. Portal vein is patent on color Doppler imaging with normal direction of blood flow towards the liver. Other: Small right pleural effusion. IMPRESSION: 1. Status post cholecystectomy. 2. Severely dilated common bile duct to the ampulla, measuring 2.6 cm in caliber. Intrahepatic biliary ductal dilatation. This may reflect benign postoperative biliary ductal dilatation,  particularly given advanced patient age, however consider CT or MRI to further evaluate for obstructing etiology if there is clinical evidence of cholestasis. 3. Small right pleural effusion. Electronically Signed   By: Delanna Ahmadi M.D.   On: 01/25/2022 10:13   ? ?Labs:  ?Basic Metabolic Panel: ?No results for input(s): NA, K, CL, CO2, GLUCOSE, BUN, CREATININE, CALCIUM, MG, PHOS in the last 168 hours. ? ? ?CBC: ?No results for input(s): WBC, NEUTROABS, HGB, HCT, MCV, PLT in the last 168 hours. ? ? ?CBG: ?No results for input(s): GLUCAP in the last 168 hours. ? ?Family history.  Mother with CAD and kidney cancer.  Father with heart attack.  Negative for colon cancer esophageal stomach cancer or rectal cancer ? ?Brief HPI:   Mackenzie Madden is a 88 y.o. right-handed female with history of hypertension hypothyroidism glaucoma recently treated UTI.  Per chart review lives with spouse.  Independent with dressing and toileting.  Presented 01/23/2022 after ground-level fall without loss of consciousness.  Family had reported some bouts of vertigo over the past 2 weeks.  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  X-rays of left wrist showed comminuted displaced and angulated distal radius fracture with intra-articular extension.  Displaced angulated distal ulnar fracture.  Admission chemistries unremarkable Sub sodium 132 urine culture greater 100,000 E. coli.  Patient underwent left distal ORIF/irrigation debridement as well as distal ulnar open external fixation 01/24/2021 per Dr. Orene Desanctis.  Nonweightbearing left upper extremity.  Subcutaneous Lovenox for DVT prophylaxis.  She completed a 5-day course of Keflex for UTI.  Mildly elevated LFTs with nonspecific ultrasound resolved.  Therapy evaluations completed due to patient decreased functional mobility was admitted for a comprehensive rehab  program. ? ? ?Hospital Course: KENSIE SUSMAN was admitted to rehab 01/31/2022 for inpatient therapies to consist of PT, ST and OT at least three hours five days a week. Past admission physiatrist, therapy team and rehab

## 2022-02-06 NOTE — Progress Notes (Signed)
Orthopedic Tech Progress Note ?Patient Details:  ?Mackenzie Madden ?1928/09/20 ?[id number] ? ?Ortho Devices ?Type of Ortho Device: Velcro wrist splint ?Ortho Device/Splint Location: LUE ?Ortho Device/Splint Interventions: Ordered ?  ?  ? ?Mackenzie Madden ?02/06/2022, 3:03 PM ? ?
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
## 2022-02-06 NOTE — Progress Notes (Signed)
Patient ID: Mackenzie Madden, female   DOB: 02/01/1928, 88 y.o.   MRN: [id number]  Met with pt, husband and daughter to discuss equipment needs-hemi walker has been delivered to room, daughter to look for wheelchair-transport chair via church. Aware only one assistive device is covered. Aware of earlier discharge date to 31/10. All in agreement and feel prepared for discharge. Will update [facility] regarding earlier discharge.  ?

## 2022-02-06 NOTE — Progress Notes (Signed)
Physical Therapy Session Note ? ?Patient Details  ?Name: Mackenzie Madden ?MRN: [id number] ?Date of Birth: March 14, 1928 ? ?Today's Date: 02/06/2022 ?PT Individual Time: 2542-7062 +  3762-8315 ?PT Individual Time Calculation (min): 73 min + 60 min  ? ?Short Term Goals: ?Week 1:  PT Short Term Goal 1 (Week 1): Pt will complete bed mobility with minA and LRAD ?PT Short Term Goal 2 (Week 1): Pt will complete bed<>chair transfers with minA and LRAD ?PT Short Term Goal 3 (Week 1): Pt will ambulate 27f with minA and LRAD ?PT Short Term Goal 4 (Week 1): Pt will improve BERG balance test by at least 7 points ? ?Skilled Therapeutic Interventions/Progress Updates:  ?   ?1st session: ?Pt presenting sitting in w/c, Mackenzie Madden at bedside and pt agreeable to PT tx. Denies pain. Discussed DC planning to start - PT Goals, pt's current mobility status, etc. Discussed tentative DC date of Tuesday 5/16 - pt and Mackenzie Madden agreeable to moving up DC date due to progress - messaged rehab team for possible DC date of Wednesday 5/10.  ? ?Transported to main rehab gym in w/c for time management. Donned LUE sling with totalA. [facility]<>[facility] with verbal cueing needed for hand placement to push from w/c arm rest. Ambulated 1882fwith CGA and HW with x2 self correct LOB to the L. She had x1 LOB to the L while turning to sit at the end of gait trial that required minA for correction - suspect this was due to fatigue and eagerness to sit. Discussed energy conservation, home safety, fall prevention during rest break. ? ?Dynamic standing balance training using 6inch step and HW for RUE support - 2lb ankle weights attached bilaterally to LE - 2x12 alternating toe taps with light minA for balance and seated rest break b/w sets.  ? ?Dynamic standing balance training with no UE support and CGA for steadying - 2x12 RUE exercises using 2lb dumbbell: bicep curls, shldr abd to 90deg, shldr press, arm punches. Seated rest break b/w sets. Seated UE  strengthening to target LUE shoulder ROM/strengthening with sling removed. 1x15 seated arm raises to ~160deg, 1x15 seated horizontal shldr abd ? ?Returned to her room in w/c  - remained seated with Mackenzie Madden at bedside. All needs met.  ? ?2nd session: ?Pt greeted in w/c, agreeable to PT tx and denies pain. Family (Mackenzie Madden and Mackenzie Madden) at the bedside. Focused session on family education/training to prepare for upcoming DC. Lengthy discussion regarding general DC planning, home safety, fall prevention, NWB status for her LUE, DME rec's, and role of f/u therapies. Family asking about wheelchair for longer distance mobility - spoke with CSW after session regarding options for 16x16 transport wheelchair. Reminded them of PT goals, pt's current mobility status, and recommendation for 24/7 supervision and CGA for gait - all voiced understanding. ? ?Transported to ortho rehab gym to complete car transfer with car height simulating ToOccupational hygienistCar transfer completed with CGA and HW - min cueing for safety and technique. Family actively observing and reporting being impressed with mobility as this was better than her baseline. She then practiced navigating ~[id number] - able to ambulate up/down ramp with CGA and HW with min cues for safety and HW management (keeping it wide).  ? ?Completed curb training (4inch curb) using the HW with CGA and mod instructional cues. Curb training completed per request from Mackenzie Madden as pt has threshold to navigate in order to enter the front door. Family actively observing for curb transfer. ? ?  Transported to ADL apartment room to work on change in surfaces, furniture transfers, and bed mobility on regular flat bed. Ambulated in ADL apartment (on carpet) with CGA and HW - completed sit>supine with modA for BLE and trunk support, minA required for sit>supine. Pt reporting some baseline assistance required for bed mobility at times, but was able to mostly complete by herself. Complete furniture  transfer from low sitting sofa with CGA and HW - cues for hand placement and increasing her forward weight shifting.  ? ?Returned to her room and she remained seated in w/c with all needs met. All questions/concerns addressed from family, all members reporting confidence and readiness for upcoming DC.  ? ? ?Therapy Documentation ?Precautions:  ?Precautions ?Precautions: Fall ?Precaution Comments: Baseline diplopia ?Required Braces or Orthoses: Splint/Cast ?Splint/Cast: L wrist, sling for comfort ?Restrictions ?Weight Bearing Restrictions: Yes ?LUE Weight Bearing: Non weight bearing ?Other Position/Activity Restrictions: NWB hand and elbow per ortho ?General: ?  ? ? ?Therapy/Group: Individual Therapy ? ?Mackenzie Madden P Mackenzie Madden ?02/06/2022, 7:38 AM  ?

## 2022-02-06 NOTE — Plan of Care (Signed)
?  Problem: Consults ?Goal: RH GENERAL PATIENT EDUCATION ?Description: See Patient Education module for education specifics. ?Outcome: Progressing ?  ?Problem: RH BOWEL ELIMINATION ?Goal: RH STG MANAGE BOWEL WITH ASSISTANCE ?Description: STG Manage Bowel with mod I Assistance. ?Outcome: Progressing ?Goal: RH STG MANAGE BOWEL W/MEDICATION W/ASSISTANCE ?Description: STG Manage Bowel with Medication with mod I Assistance. ?Outcome: Progressing ?  ?Problem: RH BLADDER ELIMINATION ?Goal: RH STG MANAGE BLADDER WITH ASSISTANCE ?Description: STG Manage Bladder With toileting Assistance ?Outcome: Progressing ?  ?Problem: RH SKIN INTEGRITY ?Goal: RH STG SKIN FREE OF INFECTION/BREAKDOWN ?Description: Manage w min assist ?Outcome: Progressing ?Goal: RH STG MAINTAIN SKIN INTEGRITY WITH ASSISTANCE ?Description: STG Maintain Skin Integrity With min Assistance. ?Outcome: Progressing ?Goal: RH STG ABLE TO PERFORM INCISION/WOUND CARE W/ASSISTANCE ?Description: STG Able To Perform Incision/Wound Care With min Assistance. ?Outcome: Progressing ?  ?Problem: RH SAFETY ?Goal: RH STG ADHERE TO SAFETY PRECAUTIONS W/ASSISTANCE/DEVICE ?Description: STG Adhere to Safety Precautions With cues Assistance/Device. ?Outcome: Progressing ?  ?Problem: RH PAIN MANAGEMENT ?Goal: RH STG PAIN MANAGED AT OR BELOW PT'S PAIN GOAL ?Description: At or below level 4 with prns ?Outcome: Progressing ?  ?Problem: RH KNOWLEDGE DEFICIT GENERAL ?Goal: RH STG INCREASE KNOWLEDGE OF SELF CARE AFTER HOSPITALIZATION ?Description: Patient and spouse will be able to manage care at discharge using handouts and educational resources independently ?Outcome: Progressing ?  ?

## 2022-02-06 NOTE — Progress Notes (Signed)
Occupational Therapy Session Note ? ?Patient Details  ?Name: Mackenzie Madden ?MRN: [id number] ?Date of Birth: 1927/10/03 ? ?Today's Date: 02/06/2022 ?OT Individual Time: 1700-1749 ?OT Individual Time Calculation (min): 40 min  ? ? ?Short Term Goals: ?Week 1:  OT Short Term Goal 1 (Week 1): Pt will donn UB with no more than min A ?OT Short Term Goal 2 (Week 1): Pt will sit > stand with CGA using LRAD in prep for ADL ?OT Short Term Goal 3 (Week 1): Pt will complete 2/3 toileting steps with no more than CGA ? ?Skilled Therapeutic Interventions/Progress Updates:  ?  Pt resting in w/c upon arrival with husband and daughter present. Discharge changed to 5/10 vs 5/16. OT intervention with focus on education. Pt already possesses BSC which she was using beside her bed at home. Transfers with CGA using hemiwalker. Educated daughter and husband on covering LUE with plastic bags and using paper tape. Per pt report, pt requires mod A for bathing/dressing tasks. Husband reports that he was assisting pt with BADLs prior to this hospitalization. Educated pt and family on LUE shoulder and elbow AROM and finger AROM to maintain mobility. Pt remained in w/c with family present. All needs within reach.  ? ?Therapy Documentation ?Precautions:  ?Precautions ?Precautions: Fall ?Precaution Comments: Baseline diplopia ?Required Braces or Orthoses: Splint/Cast ?Splint/Cast: L wrist, sling for comfort ?Restrictions ?Weight Bearing Restrictions: Yes ?LUE Weight Bearing: Non weight bearing ?Other Position/Activity Restrictions: NWB hand and elbow per ortho ? ?Pain: ? Pt reports Lt hand pain (edema noted); educated on AROM and positioning ? ?Therapy/Group: Individual Therapy ? ?Leroy Libman ?02/06/2022, 12:17 PM ?

## 2022-02-06 NOTE — Progress Notes (Signed)
Pharmacy Antibiotic Note ? ?EMELEE RODOCKER is a 88 y.o. female admitted to inpatient rehab on 01/31/2022.  Patient with UCx = Morganella and pharmacy has been consulted for empiric antibiotic dosing. ? ?5/4 UCx = Morganella Morganii - sens pending ? ?Based on our system antibiogram, urine cx are 90% sens to Cipro and 83% sens to Bactrim.  Pt has no allergies and age appropriate renal function.  Will start Bactrim due to adverse risks associated with flouroquinolones in the elderly. ? ?Plan: ?Bactrim 1 DS tab BID x 3 days. ?D/C Keflex. ?Follow-up sensitivities. ? ?[employer], Pharm.D., BCPS ?Clinical Pharmacist ?02/06/2022 7:47 PM ? ?

## 2022-02-07 LAB — URINE CULTURE: Culture: >=100000 — AB

## 2022-02-07 MED ORDER — ACETAMINOPHEN 325 MG PO TABS: 650.0000 mg | ORAL_TABLET | Freq: Four times a day (QID) | ORAL | Status: AC | PRN

## 2022-02-07 MED ORDER — AMLODIPINE BESYLATE 2.5 MG PO TABS: 2.5000 mg | ORAL_TABLET | Freq: Every day | ORAL | 0 refills | Status: DC

## 2022-02-07 MED ORDER — MECLIZINE HCL 12.5 MG PO TABS
12.5000 mg | ORAL_TABLET | Freq: Every day | ORAL | 0 refills | Status: DC
Start: 2022-02-07 — End: 2022-02-20

## 2022-02-07 MED ORDER — LEVOTHYROXINE SODIUM 88 MCG PO TABS: 88.0000 ug | ORAL_TABLET | Freq: Every day | ORAL | 0 refills | Status: AC

## 2022-02-07 NOTE — Progress Notes (Signed)
Occupational Therapy Discharge Summary ? ?Patient Details  ?Name: Mackenzie Madden ?MRN: [id number] ?Date of Birth: [birth year] ? ?Patient has met 4 of 10 long term goals due to improved activity tolerance, improved balance, and ability to compensate for deficits.  Patient to discharge at overall CGA/min A level.  Patient's care partner is independent to provide the necessary physical assistance at discharge.   ? ?Reasons goals not met: Patient still requires min A for UB ADLs and Mod A for LB ADLs due to gaurding of L UE and difficulty performing tasks with one hand, despite modifications. Patient's spouse also assisted with some BADLs at baseline and plans to continue assisting with those at discharge.  ? ?Recommendation:  ?Patient will benefit from ongoing skilled OT services in home health setting to continue to advance functional skills in the area of BADL. ? ?Equipment: ?No equipment provided ? ?Reasons for discharge: treatment goals met and discharge from hospital ? ?Patient/family agrees with progress made and goals achieved: Yes ? ?OT Discharge ?Precautions/Restrictions  ?Precautions ?Precautions: Fall ?Splint/Cast: L wrist, sling for comfort ?Restrictions ?Weight Bearing Restrictions: Yes ?LUE Weight Bearing: Non weight bearing ?Other Position/Activity Restrictions: NWB hand and elbow per ortho ?Pain ? Denies pain ?ADL ?ADL ?Eating: Modified independent ?Where Assessed-Eating: Wheelchair ?Grooming: Modified independent ?Where Assessed-Grooming: Wheelchair ?Upper Body Bathing: Minimal assistance ?Where Assessed-Upper Body Bathing: Wheelchair ?Lower Body Bathing: Minimal assistance ?Where Assessed-Lower Body Bathing: Wheelchair ?Upper Body Dressing: Minimal assistance ?Where Assessed-Upper Body Dressing: Wheelchair ?Lower Body Dressing: Moderate assistance ?Where Assessed-Lower Body Dressing: Wheelchair ?Toileting: Minimal assistance ?Where Assessed-Toileting: Toilet ?Toilet Transfer: Contact guard ?Toilet  Transfer Method: Ambulating ?Science writer: Grab bars ?Tub/Shower Transfer: Unable to assess ?Tub/Shower Transfer Method: Unable to assess ?Walk-In Shower Transfer: Contact guard ?Walk-In Shower Transfer Method: Unable to assess ?Cognition ?Cognition ?Overall Cognitive Status: History of cognitive impairments - at baseline ?Arousal/Alertness: Awake/alert ?Orientation Level: Person;Place;Situation ?Person: Oriented ?Place: Oriented (with field of 3 choices) ?Situation: Oriented ?Memory: Impaired ?Memory Impairment: Decreased recall of new information;Decreased short term memory ?Decreased Short Term Memory: Verbal basic;Functional basic ?Awareness: Appears intact ?Problem Solving: Impaired ?Brief Interview for Mental Status (BIMS) ?Repetition of Three Words (First Attempt): 3 ?Temporal Orientation: Year: Correct ?Temporal Orientation: Month: Accurate within 5 days ?Temporal Orientation: Day: Incorrect ?Recall: "Sock": Yes, after cueing ("something to wear") ?Recall: "Blue": Yes, no cue required ?Recall: "Bed": Yes, no cue required ?BIMS Summary Score: 13 ?Sensation ?Sensation ?Light Touch: Appears Intact ?Hot/Cold: Appears Intact ?Coordination ?Coordination and Movement Description: Limited by NWB status on LUE and general deconditioning and weakness ?Motor  ?Motor ?Motor - Discharge Observations: Improved since date of evaluation ?Mobility  ?Transfers ?Sit to Stand: Contact Guard/Touching assist ?Stand to Sit: Contact Guard/Touching assist  ?Balance ?Balance ?Balance Assessed: Yes ?Static Sitting Balance ?Static Sitting - Balance Support: Feet supported;No upper extremity supported ?Static Sitting - Level of Assistance: 7: Independent ?Dynamic Sitting Balance ?Dynamic Sitting - Balance Support: Feet supported;No upper extremity supported ?Dynamic Sitting - Level of Assistance: 5: Stand by assistance ?Static Standing Balance ?Static Standing - Balance Support: Right upper extremity supported ?Static  Standing - Level of Assistance: 5: Stand by assistance ?Dynamic Standing Balance ?Dynamic Standing - Balance Support: Right upper extremity supported;During functional activity ?Dynamic Standing - Level of Assistance: 4: Min assist (CGA) ?Extremity/Trunk Assessment ?RUE Assessment ?RUE Assessment: Exceptions to [facility] ?Active Range of Motion (AROM) Comments: Limited shoulder flexion >90 degrees at baseline ?LUE Assessment ?LUE Assessment: Exceptions to [facility] ?Active Range of Motion (AROM) Comments: Improved finger flex/ext and thumb  oposition, no wrist ROM due to splint, ? ? ?Mackenzie Madden Mackenzie Madden ?02/07/2022, 3:49 PM ?

## 2022-02-07 NOTE — Plan of Care (Signed)
  Problem: Urinary Elimination: Goal: Signs and symptoms of infection will decrease Outcome: Progressing   
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
## 2022-02-07 NOTE — Progress Notes (Signed)
Physical Therapy Session Note ? ?Patient Details  ?Name: Mackenzie Madden ?MRN: [id number] ?Date of Birth: 03-Mar-1928 ? ?Today's Date: 02/07/2022 ?PT Individual Time: 3151-7616 + 1000-1045 ?PT Individual Time Calculation (min): 73 min  + 45 min ? ?Short Term Goals: ?Week 1:  PT Short Term Goal 1 (Week 1): Pt will complete bed mobility with minA and LRAD ?PT Short Term Goal 2 (Week 1): Pt will complete bed<>chair transfers with minA and LRAD ?PT Short Term Goal 3 (Week 1): Pt will ambulate 50f with minA and LRAD ?PT Short Term Goal 4 (Week 1): Pt will improve BERG balance test by at least 7 points ? ?Skilled Therapeutic Interventions/Progress Updates:  ?   ?1st session: ?Pt presenting sitting in w/c with her husband at the bedside. She denies pain but reported pain yesterday when they removed some of her sutures. Pt now in soft velcro wrist splint for her LUE. Her HW delivered to her room - adjusted to fit. Transported to main rehab gym for time management.  ? ?Instructed in gait training ~1535fwith CGA and HW - gait speed slowed with downward gaze and step-to gait pattern - pt with difficulty sequencing gait/HW, especially with distractions. She reports [facility] and she benefited from min cues for sequencing to correct.  ? ?MD entering session for morning rounding - discussing + UTI with planned antibiotic 3-day tx. + UTI explains increased difficulty with gait sequencing. ? ?Instructed in stair training - she navigated up/down x12, 6inch steps using 1 hand rail on R - cues for step-to pattern for safety. Demo's increased forward flexed trunk during stairs, no knee buckling but does have moderate posterior lean. Pt does not have stairs to enter home, ramped entrance. Stairs completed for community reintegration, balance, strengthening, and endurance exercises.  ? ?1x5 sit<>stands from arm chair with no UE support, minA for powering to rise with facilitation for forward weight shifting due  to posterior bias ?1x5 sit<>stands from arm chair with RUE support, CGA with facilitation for forward weight shifting due to posterior bias. Anticipate that difficulties are likely due to her + UTI ? ?TUG completed x3 trials using HW and CGA ?1) 109s ?2) 75s ?3) 74s ?AVG = 86 seconds ?*Scores > 13.5 seconds indicate increased falls risk. ? ?Transported back to her room in w/c for time management. Remained seated in w/c with her husband at bedside who was updated on pt's mobility. All needs met.  ? ?2nd session: ?Pt sitting in w/c to start - agreeable to PT tx and denies pain. Transported to main rehab gym for time management.  ? ?Developed HEP which is listed below. Emphasized importance of completing this with supervision from family and NOT to do alone. Also provide personal instructions on handout to remind her not to place weight through her LUE and to have family assisting for each standing exercise. Handout provided as well. Reviewed these exercises with patient and she completed all 1x10 reps each.  ? ?Access Code: [email or web address]: [email or web address] ?Date: 02/07/2022 ?Prepared by: ChGinnie Smart ?Exercises ?- Sit to Stand with Counter Support  - 1 x daily - 7 x weekly - 3 sets - 10 reps ?- Standing March with Counter Support  - 1 x daily - 7 x weekly - 3 sets - 10 reps ?- Standing March with Counter Support  - 1 x daily - 7 x weekly - 3 sets - 10 reps ?- Standing Hip Abduction with Counter Support  - 1  x daily - 7 x weekly - 3 sets - 10 reps ?- Heel Raises with Counter Support  - 1 x daily - 7 x weekly - 3 sets - 10 reps ?- Mini Squat with Counter Support  - 1 x daily - 7 x weekly - 3 sets - 10 reps ?- Seated Shoulder Horizontal Abduction - Thumbs Up  - 1 x daily - 7 x weekly - 3 sets - 10 reps ?- Seated Shoulder Flexion  - 1 x daily - 7 x weekly - 3 sets - 10 reps ? ?Transported back to her room in w/c for time management. She remained seated in w/c with her husband at the bedside. All  needs met.  ? ?Therapy Documentation ?Precautions:  ?Precautions ?Precautions: Fall ?Precaution Comments: Baseline diplopia ?Required Braces or Orthoses: Splint/Cast ?Splint/Cast: L wrist, sling for comfort ?Restrictions ?Weight Bearing Restrictions: Yes ?LUE Weight Bearing: Non weight bearing ?Other Position/Activity Restrictions: NWB hand and elbow per ortho ?General: ?  ? ? ?Therapy/Group: Individual Therapy ? ?Mackenzie Madden Mackenzie Madden PT ?02/07/2022, 7:23 AM  ?

## 2022-02-07 NOTE — Progress Notes (Signed)
Lidocaine and tegaderm applied @ 1600 to left wrist for pain relief for suture removal. Sutures removed by Risa Grill PA. Pt tolerated well. Daughter at bedside. Wrist wrapped in kerlix and splint applied. Pt has no complaints of pain at present. ?

## 2022-02-07 NOTE — Progress Notes (Signed)
Inpatient Rehabilitation Care Coordinator ?Discharge Note  ? ?Patient Details  ?Name: Mackenzie Madden ?MRN: [id number] ?Date of Birth: March 08, 1928 ? ? ?Discharge location: [place] DUTY ASSIST-ALSO DAUGHTER ? ?Length of Stay: 8 days ? ?Discharge activity level: SUPERVISION-CGA LEVEL ? ?Home/community participation: ACTIVE ? ?Patient response TE:LMRAJH Literacy - How often do you need to have someone help you when you read instructions, pamphlets, or other written material from your doctor or pharmacy?: Never ? ?Patient response HI:DUPBDH Isolation - How often do you feel lonely or isolated from those around you?: Rarely ? ?Services provided included: MD, RD, PT, OT, RN, CM, Pharmacy, SW ? ?Financial Services:  ?Charity fundraiser Utilized: Private Insurance ?[employer]-MEDICARE ? ?Choices offered to/list presented to: PT AND DAUGHTER ? ?Follow-up services arranged:  ?Home Health, DME, Patient/Family request agency HH/DME ?Home Health Agency: [facility]  ?  ?DME : [facility]  [place] ?HH/DME Requested Agency: [facility] ? ?Patient response to transportation need: ?Is the patient able to respond to transportation needs?: Yes ?In the past 12 months, has lack of transportation kept you from medical appointments or from getting medications?: No ?In the past 12 months, has lack of transportation kept you from meetings, work, or from getting things needed for daily living?: No ? ? ? ?Comments (or additional information):DAUGHTER AND HUSBAND WAS HERE DAILY AND SAW PT'S PROGRESS. DAUGHTER TO HIRE SOME ASSIST FOR HOME MANAGEMENT AT HOME. ? ?Patient/Family verbalized understanding of follow-up arrangements:  Yes ? ?Individual responsible for coordination of the follow-up plan: LISA-DAUGHTER [phone] ? ?Confirmed correct DME delivered: Elease Hashimoto 02/07/2022   ? ?Elease Hashimoto ?

## 2022-02-07 NOTE — Discharge Summary (Signed)
Physical Therapy Discharge Summary ? ?Patient Details  ?Name: Mackenzie Madden ?MRN: [id number] ?Date of Birth: 30-Apr-1928 ? ?Patient has met 8 of 8 long term goals due to improved activity tolerance, improved balance, improved postural control, increased strength, decreased pain, and improved coordination.  Patient to discharge at an ambulatory level  CGA .   Patient's care partner is independent to provide the necessary physical and cognitive assistance at discharge. Both husband and daughter were active with family training and education sessions - all members feel confident and ready for upcoming DC.  ? ?Reasons goals not met: n/a ? ?Recommendation:  ?Patient will benefit from ongoing skilled PT services in home health setting to continue to advance safe functional mobility, address ongoing impairments in functional mobility, dynamic standing balance, general strengthening and conditioning, home safety, and minimize fall risk. ? ?Equipment: ?HW - family getting w/c from their church ? ?Reasons for discharge: treatment goals met and discharge from hospital ? ?Patient/family agrees with progress made and goals achieved: Yes ? ?PT Discharge ?Precautions/Restrictions ?Precautions ?Precautions: Fall ?Precaution Comments: Baseline diplopia, baseline incontinence ?Required Braces or Orthoses: Splint/Cast ?Splint/Cast: L wrist, sling for comfort ?Restrictions ?Weight Bearing Restrictions: Yes ?LUE Weight Bearing: Non weight bearing ?Other Position/Activity Restrictions: NWB hand and elbow per ortho ?Pain Interference ?Pain Interference ?Pain Effect on Sleep: 1. Rarely or not at all ?Pain Interference with Therapy Activities: 1. Rarely or not at all ?Pain Interference with Day-to-Day Activities: 2. Occasionally ?Vision/Perception  ?Vision - History ?Ability to See in Adequate Light: 1 Impaired ?Perception ?Perception: Within Functional Limits ?Praxis ?Praxis: Intact  ?Cognition ?Overall Cognitive Status: History of  cognitive impairments - at baseline ?Arousal/Alertness: Awake/alert ?Orientation Level: Oriented X4 ?Memory: Impaired ?Memory Impairment: Decreased recall of new information;Decreased short term memory ?Decreased Short Term Memory: Verbal basic;Functional basic ?Awareness: Appears intact ?Problem Solving: Impaired ?Problem Solving Impairment: Functional complex;Verbal complex ?Safety/Judgment: Appears intact ?Sensation ?Sensation ?Light Touch: Appears Intact ?Hot/Cold: Appears Intact ?Proprioception: Appears Intact ?Stereognosis: Not tested ?Coordination ?Gross Motor Movements are Fluid and Coordinated: No ?Coordination and Movement Description: Limited by NWB status on LUE and general deconditioning and weakness ?Motor  ?Motor ?Motor: Other (comment);Abnormal postural alignment and control ?Motor - Discharge Observations: Improved since date of evaluation - continues to show posterior bias, fear of falling.  ?Mobility ?Bed Mobility ?Bed Mobility: Rolling Right;Rolling Left;Supine to Sit;Sit to Supine ?Rolling Right: Supervision/verbal cueing ?Rolling Left: Supervision/Verbal cueing ?Supine to Sit: Supervision/Verbal cueing ?Sit to Supine: Minimal Assistance - Patient > 75% ?Transfers ?Transfers: Sit to Stand;Stand to [employer] Transfers ?Sit to Stand: Contact Guard/Touching assist ?Stand to Sit: Contact Guard/Touching assist ?Stand Pivot Transfers: Contact Guard/Touching assist ?Stand Pivot Transfer Details: Verbal cues for safe use of DME/AE;Verbal cues for gait pattern;Verbal cues for precautions/safety;Verbal cues for technique;Verbal cues for sequencing;Visual cues for safe use of DME/AE;Visual cues/gestures for precautions/safety ?Transfer (Assistive device): Hemi-walker ?Locomotion  ?Gait ?Ambulation: Yes ?Gait Assistance: Contact Guard/Touching assist ?Gait Distance (Feet): 180 Feet ?Assistive device: Hemi-walker ?Gait Assistance Details: Verbal cues for sequencing;Verbal cues for safe use of  DME/AE;Verbal cues for gait pattern;Verbal cues for technique;Verbal cues for precautions/safety;Tactile cues for posture;Tactile cues for weight shifting ?Gait ?Gait: Yes ?Gait Pattern: Impaired ?Gait Pattern: Step-through pattern;Decreased stride length;Decreased dorsiflexion - left;Decreased hip/knee flexion - left;Trunk flexed;Narrow base of support;Poor foot clearance - left ?Gait velocity: decreased ?Stairs / Additional Locomotion ?Stairs: Yes ?Stairs Assistance: Minimal Assistance - Patient > 75% ?Stair Management Technique: One rail Right ?Number of Stairs: 12 ?Height of Stairs: 6 ?Curb: Minimal Assistance -  Patient >75% ?Pick up small object from the floor assist level: Minimal Assistance - Patient > 75% ?Wheelchair Mobility ?Wheelchair Mobility: No  ?Trunk/Postural Assessment  ?Cervical Assessment ?Cervical Assessment: Exceptions to [facility] (forward head posture) ?Thoracic Assessment ?Thoracic Assessment: Exceptions to [facility] (rounded shoulders) ?Lumbar Assessment ?Lumbar Assessment: Exceptions to [facility] (posterior pelvic tilt) ?Postural Control ?Postural Control: Deficits on evaluation (posterior bias)  ?Balance ?Balance ?Balance Assessed: Yes ?Standardized Balance Assessment ?Standardized Balance Assessment: Timed Up and Go Test ?Timed Up and Go Test ?TUG: Normal TUG (with HW) ?Normal TUG (seconds): 86 (AVG of 3 trials (109s, 75s, 74s)) ?Static Sitting Balance ?Static Sitting - Balance Support: Feet supported;No upper extremity supported ?Static Sitting - Level of Assistance: 7: Independent ?Dynamic Sitting Balance ?Dynamic Sitting - Balance Support: Feet supported;No upper extremity supported ?Dynamic Sitting - Level of Assistance: 5: Stand by assistance ?Static Standing Balance ?Static Standing - Balance Support: Right upper extremity supported ?Static Standing - Level of Assistance: 5: Stand by assistance ?Dynamic Standing Balance ?Dynamic Standing - Balance Support: Right upper extremity supported;During  functional activity ?Dynamic Standing - Level of Assistance: 4: Min assist   ?Extremity Assessment  ?  ?  ?RLE Assessment ?RLE Assessment: Exceptions to [facility] ?General Strength Comments: Grossly 4/5 ?LLE Assessment ?LLE Assessment: Exceptions to [facility] ?General Strength Comments: Grossly 4/5 ? ? ?Alger Simons PT ?02/07/2022, 7:28 AM ?

## 2022-02-07 NOTE — Progress Notes (Addendum)
Restful at intervals throughout shift, denies pain to left wrist/hand, spouse at bedside, Support provided, Wound vac intact state pain is 6/10 after medication, continue regime assisted as needed ?

## 2022-02-07 NOTE — Progress Notes (Signed)
Inpatient Rehabilitation Discharge Medication Review by a Pharmacist ? ?A complete drug regimen review was completed for this patient to identify any potential clinically significant medication issues. ? ?High Risk Drug Classes Is patient taking? Indication by Medication  ?Antipsychotic No   ?Anticoagulant No   ?Antibiotic Yes PO Bactrim for UTI  ?Opioid No   ?Antiplatelet No   ?Hypoglycemics/insulin No   ?Vasoactive Medication Yes Norvasc for BP  ?Chemotherapy No   ?Other Yes Cosopt, Xalatan for glaucoma ?Meclizine for dizziness ?Synthroid for low thyroid  ? ? ? ?Type of Medication Issue Identified Description of Issue Recommendation(s)  ?Drug Interaction(s) (clinically significant) ?    ?Duplicate Therapy ?    ?Allergy ?    ?No Medication Administration End Date ?    ?Incorrect Dose ?    ?Additional Drug Therapy Needed ?    ?Significant med changes from prior encounter (inform family/care partners about these prior to discharge).    ?Other ?    ? ? ?Clinically significant medication issues were identified that warrant physician communication and completion of prescribed/recommended actions by midnight of the next day:  No ? ?Pharmacist comments: None ? ?Time spent performing this drug regimen review (minutes):  20 minutes ? ? ?[name] ?02/07/2022 9:47 AM ?

## 2022-02-07 NOTE — Progress Notes (Signed)
Occupational Therapy Session Note ? ?Patient Details  ?Name: Mackenzie Madden ?MRN: [id number] ?Date of Birth: [birth year] ? ?Today's Date: 02/07/2022 ?Session 1 ?OT Individual Time: 1100-1200 ?OT Individual Time Calculation (min): 60 min  ? ?Session 2 ?OT Individual Time: 6644-0347 ?OT Individual Time Calculation (min): 45 min  ? ? ?Short Term Goals: ?Week 1:  OT Short Term Goal 1 (Week 1): Pt will donn UB with no more than min A ?OT Short Term Goal 2 (Week 1): Pt will sit > stand with CGA using LRAD in prep for ADL ?OT Short Term Goal 3 (Week 1): Pt will complete 2/3 toileting steps with no more than CGA ? ?Skilled Therapeutic Interventions/Progress Updates:  ?  Session 1 ?Pt greeted seated in wc with family present. Family completed education yesterday and feeel comfortable with BADLs and transfers. Pt brought to therapy gym and addressed standing balance/endurance and problem solving with standing peg board puzzle. Pt needed min cues to locate pegs in horizontal pattern. She tolerated 5-7 minute standing bouts before needing to sit. L hand exercises with focus on finger opposition and full flexion/extension. Pt reported need to go to the bathroom. Stand-pivot to toilet with CGA. Pt needed min A for clothing management, but voided bladder and completed pericare with set-up. Pt then returned to wc and performed grooming tasks at the sink mod I but increased time.  ?Denies pain ? ?Session 2 ?Pt greeted seated in wc after lunch with family. OT printed out L hand exercises via [employer] and went through them with family. Educated caregivers on correct form and how to cue patient to isolate joints. Thumb opposition, finger spreading, seated finger composite flexion extension, finger MP flexion AROM, Thumb abduction AROM, seated single finger extension, seated finger DIP AROM, and seated finger PIP AROM. Returned to room and worked on Probation officer. Educated pt and family on modifications for more indeendence with  self-care tasks. Husband reports assisting with many BADLs PTA. Pt needed overall min A for UB dressing, and mod A for LB dressing. Pt left seated in wc with family present and needs met.  ? ?Therapy Documentation ?Precautions:  ?Precautions ?Precautions: Fall ?Precaution Comments: Baseline diplopia, baseline incontinence ?Required Braces or Orthoses: Splint/Cast ?Splint/Cast: L wrist, sling for comfort ?Restrictions ?Weight Bearing Restrictions: Yes ?LUE Weight Bearing: Non weight bearing ?Other Position/Activity Restrictions: NWB hand and elbow per ortho ?Pain: ? Denies pain ? ?Therapy/Group: Individual Therapy ? ?Daneen Schick Colie Josten ?02/07/2022, 3:34 PM ?

## 2022-02-07 NOTE — Plan of Care (Signed)
?  Problem: RH Balance ?Goal: LTG Patient will maintain dynamic standing with ADLs (OT) ?Description: LTG:  Patient will maintain dynamic standing balance with assist during activities of daily living (OT)  ?Outcome: Not Met (add Reason) ?Note: Needs CGA dynamic balance-ESD ?  ?Problem: Sit to Stand ?Goal: LTG:  Patient will perform sit to stand in prep for activites of daily living with assistance level (OT) ?Description: LTG:  Patient will perform sit to stand in prep for activites of daily living with assistance level (OT) ?Outcome: Not Met (add Reason) ?Note: CGA sit<>stand-ESD ?  ?Problem: RH Eating ?Goal: LTG Patient will perform eating w/assist, cues/equip (OT) ?Description: LTG: Patient will perform eating with assist, with/without cues using equipment (OT) ?Outcome: Completed/Met ?  ?Problem: RH Grooming ?Goal: LTG Patient will perform grooming w/assist,cues/equip (OT) ?Description: LTG: Patient will perform grooming with assist, with/without cues using equipment (OT) ?Outcome: Completed/Met ?  ?Problem: RH Bathing ?Goal: LTG Patient will bathe all body parts with assist levels (OT) ?Description: LTG: Patient will bathe all body parts with assist levels (OT) ?Outcome: Not Met (add Reason) ?Note: Needs min A for bathing tasks-ESD ?  ?Problem: RH Dressing ?Goal: LTG Patient will perform upper body dressing (OT) ?Description: LTG Patient will perform upper body dressing with assist, with/without cues (OT). ?Outcome: Not Met (add Reason) ?Note: Min A still needed due  L UE gaurding-ESD ?Goal: LTG Patient will perform lower body dressing w/assist (OT) ?Description: LTG: Patient will perform lower body dressing with assist, with/without cues in positioning using equipment (OT) ?Outcome: Not Met (add Reason) ?Note: Mod A needed due to fear leaning forward and difficulty pulling up pants with one hand-ESD ?  ?Problem: RH Toileting ?Goal: LTG Patient will perform toileting task (3/3 steps) with assistance level  (OT) ?Description: LTG: Patient will perform toileting task (3/3 steps) with assistance level (OT)  ?Outcome: Not Met (add Reason) ?Note: Min A needed for toileting_ESD ?  ?Problem: RH Toilet Transfers ?Goal: LTG Patient will perform toilet transfers w/assist (OT) ?Description: LTG: Patient will perform toilet transfers with assist, with/without cues using equipment (OT) ?Outcome: Completed/Met ?  ?Problem: RH Tub/Shower Transfers ?Goal: LTG Patient will perform tub/shower transfers w/assist (OT) ?Description: LTG: Patient will perform tub/shower transfers with assist, with/without cues using equipment (OT) ?Outcome: Completed/Met ?  ?

## 2022-02-08 ENCOUNTER — Encounter: Payer: Self-pay | Admitting: Orthopedic Surgery

## 2022-02-08 MED ORDER — SULFAMETHOXAZOLE-TRIMETHOPRIM 800-160 MG PO TABS: 1.0000 | ORAL_TABLET | Freq: Two times a day (BID) | ORAL | 0 refills | Status: DC

## 2022-02-08 NOTE — Progress Notes (Signed)
Appears to be resting comfortably throughout shift without acute distress or discomfort, daughter at bedside, Dressing to left wrist and ortho wrist splint in place, verbalized no c/o pain, anticipated discharge home today. ?
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
## 2022-02-09 NOTE — Progress Notes (Signed)
Late Entry: ?Patient discharged home at 0900 5/10 with all personal belongings in hand. Patient and family state that discharge summary was reviewed by NP and deny any further questions at this time.  ?

## 2022-02-12 ENCOUNTER — Encounter: Payer: Self-pay | Admitting: Orthopedic Surgery

## 2022-02-20 ENCOUNTER — Encounter: Payer: Medicare Other | Attending: Physical Medicine & Rehabilitation | Admitting: Physical Medicine & Rehabilitation

## 2022-02-20 ENCOUNTER — Other Ambulatory Visit: Payer: Self-pay | Admitting: *Deleted

## 2022-02-20 ENCOUNTER — Encounter: Payer: Self-pay | Admitting: Physical Medicine & Rehabilitation

## 2022-02-20 VITALS — BP 165/70 | HR 65 | Ht 60.0 in | Wt 103.4 lb

## 2022-02-20 DIAGNOSIS — R42 Dizziness and giddiness: Secondary | ICD-10-CM | POA: Insufficient documentation

## 2022-02-20 DIAGNOSIS — S52502D Unspecified fracture of the lower end of left radius, subsequent encounter for closed fracture with routine healing: Secondary | ICD-10-CM | POA: Insufficient documentation

## 2022-02-20 DIAGNOSIS — Z87440 Personal history of urinary (tract) infections: Secondary | ICD-10-CM | POA: Diagnosis not present

## 2022-02-20 DIAGNOSIS — S52602D Unspecified fracture of lower end of left ulna, subsequent encounter for closed fracture with routine healing: Secondary | ICD-10-CM | POA: Insufficient documentation

## 2022-02-20 MED ORDER — MECLIZINE HCL 12.5 MG PO TABS: 12.5000 mg | ORAL_TABLET | Freq: Every day | ORAL | 0 refills | Status: DC

## 2022-02-20 NOTE — Progress Notes (Unsigned)
Subjective:    Patient ID: Mackenzie Madden, female    DOB: [birth year], 88 y.o.   MRN: [id number]  HPI  Hospital HPI Brief HPI:   Mackenzie Madden is a 88 y.o. right-handed female with history of hypertension hypothyroidism glaucoma recently treated UTI.  Per chart review lives with spouse.  Independent with dressing and toileting.  Presented 01/23/2022 after ground-level fall without loss of consciousness.  Family had reported some bouts of vertigo over the past 2 weeks.  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  X-rays of left wrist showed comminuted displaced and angulated distal radius fracture with intra-articular extension.  Displaced angulated distal ulnar fracture.  Admission chemistries unremarkable Sub sodium 132 urine culture greater 100,000 E. coli.  Patient underwent left distal ORIF/irrigation debridement as well as distal ulnar open external fixation 01/24/2021 per Dr. Orene Desanctis.  Nonweightbearing left upper extremity.  Subcutaneous Lovenox for DVT prophylaxis.  She completed a 5-day course of Keflex for UTI.  Mildly elevated LFTs with nonspecific ultrasound resolved.  Therapy evaluations completed due to patient decreased functional mobility was admitted for a comprehensive rehab program.     Hospital Course: OTIS BURRESS was admitted to rehab 01/31/2022 for inpatient therapies to consist of PT, ST and OT at least three hours five days a week. Past admission physiatrist, therapy team and rehab RN have worked together to provide customized collaborative inpatient rehab.  Pertaining to patient's comminuted distal angulated distal radius fracture status post ORIF with distal ulnar ORIF left wrist brachial radialis tenotomy 01/24/2022 per Dr. Greta Doom.  Nonweightbearing.  Dr. Greta Doom office was contacted advised remove sutures and apply Steri-Strips and placed in a volar  splint.  Neurovascular sensation intact.  Subcutaneous Lovenox for DVT prophylaxis discontinued patient ambulate greater than 150 feet.  She completed course of Keflex for E. coli UTI no dysuria hematuria with latest preliminary urine culture greater 100,000 Morganella morganii and empirically placed on Bactrim.  Bouts of vertigo blood pressure controlled CT MRI unremarkable Antivert added.  Blood pressure controlled on Norvasc.  She remained on her eyedrops for glaucoma.  Hormone supplement for hypothyroidism.  Bouts of constipation with laxative assistance.   HPI 88 year old female with past medical history of hypertension hypothyroidism glaucoma who recently completed rehabilitation for a displaced angulated distal ulnar fracture status post ORIF.  She reports she has been doing very well since her discharge.  Reports she has all her medicines other than meclizine.  She has pain sometimes but it is overall under control and she does not need any medications for this.  She is continuing OT and PT at home reports it is going very well.  Reports she has a PCP follow-up scheduled.  Denies any falls at home.  Reports no bowel or bladder issues.  Denies no UTI symptoms.  Reports she saw orthopedics last week she was given a new left upper extremity splint and continues to be nonweightbearing.  She has a follow-up with Ortho on June 7.  Pain Inventory Average Pain 5 Pain Right Now 0 My pain is burning  In the last 24 hours, has pain interfered with the following? General activity 3 Relation with others 1 Enjoyment of life 2 What TIME of day is your pain at its worst? daytime Sleep (in general) Good  Pain is worse with: some activites Pain improves with: rest Relief from Meds: 8  walk with assistance use  a walker how many minutes can you walk?   ability to climb steps?  no do you drive?  no use a wheelchair needs help with transfers  not employed: date last employed . I need assistance with  the following:  dressing, bathing, toileting, meal prep, household duties, and shopping  bladder control problems weakness trouble walking confusion  Any changes since last visit?  no  not taking amlodipine --bp elevated today but has been normal at home with therapy etc.  Any changes since last visit?  no  Has seen surgeon but not PCP    Family History  Problem Relation Age of Onset   CAD Mother    Kidney cancer Mother    Heart attack Father    Colon cancer Neg Hx    Esophageal cancer Neg Hx    Stomach cancer Neg Hx    Rectal cancer Neg Hx    Social History   Socioeconomic History   Marital status: Married    Spouse name: Not on file   Number of children: 4   Years of education: Not on file   Highest education level: Not on file  Occupational History   Occupation: retired  Tobacco Use   Smoking status: Never   Smokeless tobacco: Never  Substance and Sexual Activity   Alcohol use: No   Drug use: No   Sexual activity: Not on file  Other Topics Concern   Not on file  Social History Narrative   The patient is retired and married she has 4 children and grandchildren and great-grandchildren   No alcohol tobacco   Social Determinants of Health   Financial Resource Strain: Not on file  Food Insecurity: Not on file  Transportation Needs: Not on file  Physical Activity: Not on file  Stress: Not on file  Social Connections: Not on file   Past Surgical History:  Procedure Laterality Date   ABDOMINAL HYSTERECTOMY  2010   CHOLECYSTECTOMY  2000   CLOSED REDUCTION WRIST FRACTURE Left 01/24/2022   Procedure: CLOSED REDUCTION WRIST;  Surgeon: Orene Desanctis, MD;  Location: [facility];  Service: Orthopedics;  Laterality: Left;   HIP PINNING,CANNULATED Right 08/05/2019   Procedure: CANNULATED HIP PINNING;  Surgeon: Mcarthur Rossetti, MD;  Location: [facility];  Service: Orthopedics;  Laterality: Right;   I & D EXTREMITY Left 01/24/2022   Procedure: IRRIGATION AND DEBRIDEMENT  EXTREMITY;LEFT distal radius open reduction internal fixation, INTRA- articular, 3 fragments;  Surgeon: Orene Desanctis, MD;  Location: [facility];  Service: Orthopedics;  Laterality: Left;   ORIF WRIST FRACTURE Left 01/24/2022   Procedure: OPEN REDUCTION INTERNA FIXATION OF LEFT WRIST FRACTURE; LEFT wrist radiographs four views with intraoperative interpretation;  Surgeon: Orene Desanctis, MD;  Location: [facility];  Service: Orthopedics;  Laterality: Left;   SPLENECTOMY, TOTAL     Past Medical History:  Diagnosis Date   Glaucoma    Glaucoma    HOH (hard of hearing)    no hearing aids   Hypertension    Hypothyroidism    Thyroid disease    BP (!) 165/70   Pulse 65   Ht 5' (1.524 m)   Wt 103 lb 6.4 oz (46.9 kg) Comment: last recorded  SpO2 94%   BMI 20.19 kg/m   Opioid Risk Score:   Fall Risk Score:  `1  Depression screen [facility] 2/9     02/20/2022   11:51 AM  Depression screen PHQ 2/9  Decreased Interest 2  Down, Depressed, Hopeless 0  PHQ -  2 Score 2  Altered sleeping 0  Tired, decreased energy 1  Change in appetite 0  Feeling bad or failure about yourself  0  Trouble concentrating 0  Moving slowly or fidgety/restless 1  Suicidal thoughts 0  PHQ-9 Score 4  Difficult doing work/chores Somewhat difficult     Review of Systems  Constitutional: Negative.   HENT: Negative.    Eyes: Negative.   Respiratory: Negative.    Cardiovascular: Negative.   Gastrointestinal: Negative.   Endocrine: Negative.   Genitourinary:        Bladder control  Musculoskeletal:  Positive for gait problem.  Skin: Negative.   Allergic/Immunologic: Negative.   Neurological:  Positive for weakness.  Hematological: Negative.   Psychiatric/Behavioral:  Positive for confusion.   All other systems reviewed and are negative.     Objective:   Physical Exam    Gen: no distress, normal appearing HEENT: oral mucosa pink and moist, NCAT Cardio: Reg rate Chest: normal effort, normal rate of breathing Abd:  soft, non-distended Ext: no edema Psych: pleasant, normal affect Skin: intact, some bruising on bilateral upper extremities Neuro: Cranial nerves II through XII grossly intact.  Follows commands.  Answers questions.  Alert and oriented.  She is hard of hearing.  Intact to light touch in all 4 extremities Musculoskeletal:  Right upper extremity 5- out of 5 throughout Left upper extremity at least 3/5 shoulder abduction, grip and Finger abduction Bilateral hip flexors 4+ out of 5 and ankle PF and DF 4+ out of 5.  Knee extensors 5 out of 5 She has a left upper extremity splint in place       Assessment & Plan:    1.Comminuted distal angulated left distal radius fracture with intra-articular extension.  Displaced angulated distal ulna fracture.  Status post distal ORIF, left distal ulnar ORIF, left wrist brachial radialis tenotomy 01/24/2022 per Dr. Greta Doom.  -Nonweightbearing LUE -Continue home PT/OT -Continue Ortho f/u  2. Pain from fracture, well controlled overall -Continue tylenol prn  3. Dizziness, improving -Order refill Antivert  4. Recent UTI -Denies any symptoms of UTI at this time

## 2022-02-22 ENCOUNTER — Encounter: Payer: Self-pay | Admitting: Physical Medicine & Rehabilitation

## 2022-03-01 ENCOUNTER — Encounter (HOSPITAL_COMMUNITY): Payer: Self-pay | Admitting: Internal Medicine

## 2022-03-01 ENCOUNTER — Emergency Department (HOSPITAL_COMMUNITY): Payer: Medicare Other

## 2022-03-01 ENCOUNTER — Other Ambulatory Visit: Payer: Self-pay

## 2022-03-01 ENCOUNTER — Observation Stay (HOSPITAL_COMMUNITY)
Admission: EM | Admit: 2022-03-01 | Discharge: 2022-03-02 | Disposition: A | Discharge status: Home / Self Care | Payer: Medicare Other | Attending: Family Medicine | Admitting: Family Medicine

## 2022-03-01 DIAGNOSIS — R29810 Facial weakness: Secondary | ICD-10-CM | POA: Diagnosis not present

## 2022-03-01 DIAGNOSIS — Z79899 Other long term (current) drug therapy: Secondary | ICD-10-CM | POA: Insufficient documentation

## 2022-03-01 DIAGNOSIS — I10 Essential (primary) hypertension: Secondary | ICD-10-CM | POA: Diagnosis present

## 2022-03-01 DIAGNOSIS — E222 Syndrome of inappropriate secretion of antidiuretic hormone: Secondary | ICD-10-CM

## 2022-03-01 DIAGNOSIS — R4182 Altered mental status, unspecified: Secondary | ICD-10-CM | POA: Diagnosis not present

## 2022-03-01 DIAGNOSIS — R410 Disorientation, unspecified: Secondary | ICD-10-CM | POA: Diagnosis not present

## 2022-03-01 DIAGNOSIS — R531 Weakness: Secondary | ICD-10-CM | POA: Diagnosis not present

## 2022-03-01 DIAGNOSIS — I639 Cerebral infarction, unspecified: Secondary | ICD-10-CM | POA: Diagnosis not present

## 2022-03-01 DIAGNOSIS — I5032 Chronic diastolic (congestive) heart failure: Secondary | ICD-10-CM | POA: Diagnosis present

## 2022-03-01 DIAGNOSIS — N3000 Acute cystitis without hematuria: Secondary | ICD-10-CM | POA: Diagnosis not present

## 2022-03-01 DIAGNOSIS — H409 Unspecified glaucoma: Secondary | ICD-10-CM | POA: Diagnosis present

## 2022-03-01 DIAGNOSIS — G9341 Metabolic encephalopathy: Secondary | ICD-10-CM | POA: Diagnosis present

## 2022-03-01 DIAGNOSIS — E039 Hypothyroidism, unspecified: Secondary | ICD-10-CM | POA: Diagnosis present

## 2022-03-01 HISTORY — DX: Chronic diastolic (congestive) heart failure: I50.32

## 2022-03-01 HISTORY — DX: Syndrome of inappropriate secretion of antidiuretic hormone: E22.2

## 2022-03-01 LAB — URINALYSIS, ROUTINE W REFLEX MICROSCOPIC
Bilirubin Urine: NEGATIVE
Glucose, UA: NEGATIVE mg/dL
Hgb urine dipstick: NEGATIVE
Ketones, ur: NEGATIVE mg/dL
Nitrite: POSITIVE — AB
Protein, ur: NEGATIVE mg/dL
Specific Gravity, Urine: 1.004 — ABNORMAL LOW (ref 1.005–1.030)
WBC, UA: >50 WBC/hpf — ABNORMAL HIGH (ref 0–5)
pH: 8 (ref 5.0–8.0)

## 2022-03-01 LAB — BASIC METABOLIC PANEL
Anion gap: 7 (ref 5–15)
BUN: 6 mg/dL — ABNORMAL LOW (ref 8–23)
CO2: 26 mmol/L (ref 22–32)
Calcium: 9.8 mg/dL (ref 8.9–10.3)
Chloride: 100 mmol/L (ref 98–111)
Creatinine, Ser: 0.77 mg/dL (ref 0.44–1.00)
GFR, Estimated: >60 mL/min (ref >60)
Glucose, Bld: 98 mg/dL (ref 70–99)
Potassium: 4.5 mmol/L (ref 3.5–5.1)
Sodium: 133 mmol/L — ABNORMAL LOW (ref 135–145)

## 2022-03-01 LAB — CBC WITH DIFFERENTIAL/PLATELET
Abs Immature Granulocytes: 0.03 10*3/uL (ref 0.00–0.07)
Basophils Absolute: 0 10*3/uL (ref 0.0–0.1)
Basophils Relative: 0 %
Eosinophils Absolute: 0.2 10*3/uL (ref 0.0–0.5)
Eosinophils Relative: 2 %
HCT: 39.4 % (ref 36.0–46.0)
Hemoglobin: 13 g/dL (ref 12.0–15.0)
Immature Granulocytes: 0 %
Lymphocytes Relative: 26 %
Lymphs Abs: 2.4 10*3/uL (ref 0.7–4.0)
MCH: 32.5 pg (ref 26.0–34.0)
MCHC: 33 g/dL (ref 30.0–36.0)
MCV: 98.5 fL (ref 80.0–100.0)
Monocytes Absolute: 1.1 10*3/uL — ABNORMAL HIGH (ref 0.1–1.0)
Monocytes Relative: 12 %
Neutro Abs: 5.5 10*3/uL (ref 1.7–7.7)
Neutrophils Relative %: 60 %
Platelets: 144 10*3/uL — ABNORMAL LOW (ref 150–400)
RBC: 4 MIL/uL (ref 3.87–5.11)
RDW: 13.8 % (ref 11.5–15.5)
WBC: 9.2 10*3/uL (ref 4.0–10.5)
nRBC: 0 % (ref 0.0–0.2)

## 2022-03-01 LAB — I-STAT CHEM 8, ED
BUN: 6 mg/dL — ABNORMAL LOW (ref 8–23)
Calcium, Ion: 1.23 mmol/L (ref 1.15–1.40)
Chloride: 97 mmol/L — ABNORMAL LOW (ref 98–111)
Creatinine, Ser: 0.7 mg/dL (ref 0.44–1.00)
Glucose, Bld: 94 mg/dL (ref 70–99)
HCT: 40 % (ref 36.0–46.0)
Hemoglobin: 13.6 g/dL (ref 12.0–15.0)
Potassium: 4.6 mmol/L (ref 3.5–5.1)
Sodium: 132 mmol/L — ABNORMAL LOW (ref 135–145)
TCO2: 28 mmol/L (ref 22–32)

## 2022-03-01 LAB — TROPONIN I (HIGH SENSITIVITY)
Troponin I (High Sensitivity): 13 ng/L (ref <18)
Troponin I (High Sensitivity): 14 ng/L (ref <18)

## 2022-03-01 LAB — CBG MONITORING, ED: Glucose-Capillary: 100 mg/dL — ABNORMAL HIGH (ref 70–99)

## 2022-03-01 MED ORDER — LEVOTHYROXINE SODIUM 88 MCG PO TABS
88.0000 ug | ORAL_TABLET | Freq: Every day | ORAL | Status: DC
  Administered 2022-03-02: 88 ug via ORAL
  Filled 2022-03-01 (×2): qty 1

## 2022-03-01 MED ORDER — STROKE: EARLY STAGES OF RECOVERY BOOK: Freq: Once | Status: DC

## 2022-03-01 MED ORDER — CEFTRIAXONE SODIUM 1 G IJ SOLR: 1.0000 g | Freq: Once | INTRAMUSCULAR | Status: DC

## 2022-03-01 MED ORDER — ASPIRIN 325 MG PO TABS
325.0000 mg | ORAL_TABLET | Freq: Once | ORAL | Status: AC
  Administered 2022-03-02: 325 mg via ORAL
  Filled 2022-03-01: qty 1

## 2022-03-01 MED ORDER — ACETAMINOPHEN 650 MG RE SUPP: 650.0000 mg | Freq: Four times a day (QID) | RECTAL | Status: DC | PRN

## 2022-03-01 MED ORDER — DORZOLAMIDE HCL-TIMOLOL MAL 2-0.5 % OP SOLN
1.0000 [drp] | Freq: Two times a day (BID) | OPHTHALMIC | Status: DC
  Administered 2022-03-02: 1 [drp] via OPHTHALMIC
  Filled 2022-03-01: qty 10

## 2022-03-01 MED ORDER — LATANOPROST 0.005 % OP SOLN
1.0000 [drp] | Freq: Every day | OPHTHALMIC | Status: DC
  Administered 2022-03-02: 1 [drp] via OPHTHALMIC
  Filled 2022-03-01: qty 2.5

## 2022-03-01 MED ORDER — ENOXAPARIN SODIUM 40 MG/0.4ML IJ SOSY
40.0000 mg | PREFILLED_SYRINGE | Freq: Every day | INTRAMUSCULAR | Status: DC
  Administered 2022-03-02: 40 mg via SUBCUTANEOUS
  Filled 2022-03-01: qty 0.4

## 2022-03-01 MED ORDER — CLOPIDOGREL BISULFATE 75 MG PO TABS: 75.0000 mg | ORAL_TABLET | Freq: Every day | ORAL | Status: DC

## 2022-03-01 MED ORDER — CIPROFLOXACIN IN D5W 400 MG/200ML IV SOLN
400.0000 mg | Freq: Once | INTRAVENOUS | Status: AC
  Administered 2022-03-01: 400 mg via INTRAVENOUS
  Filled 2022-03-01: qty 200

## 2022-03-01 MED ORDER — ADULT MULTIVITAMIN W/MINERALS CH
1.0000 | ORAL_TABLET | Freq: Every day | ORAL | Status: DC
  Administered 2022-03-02: 1 via ORAL
  Filled 2022-03-01: qty 1

## 2022-03-01 MED ORDER — SODIUM CHLORIDE 0.9 % IV BOLUS
1000.0000 mL | Freq: Once | INTRAVENOUS | Status: AC
  Administered 2022-03-01: 1000 mL via INTRAVENOUS

## 2022-03-01 MED ORDER — ONDANSETRON HCL 4 MG PO TABS: 4.0000 mg | ORAL_TABLET | Freq: Four times a day (QID) | ORAL | Status: DC | PRN

## 2022-03-01 MED ORDER — ASPIRIN 81 MG PO TBEC
81.0000 mg | DELAYED_RELEASE_TABLET | Freq: Every day | ORAL | Status: DC
  Administered 2022-03-02: 81 mg via ORAL
  Filled 2022-03-01: qty 1

## 2022-03-01 MED ORDER — HYDRALAZINE HCL 20 MG/ML IJ SOLN: 10.0000 mg | Freq: Four times a day (QID) | INTRAMUSCULAR | Status: DC | PRN

## 2022-03-01 MED ORDER — ONDANSETRON HCL 4 MG/2ML IJ SOLN: 4.0000 mg | Freq: Four times a day (QID) | INTRAMUSCULAR | Status: DC | PRN

## 2022-03-01 MED ORDER — ACETAMINOPHEN 325 MG PO TABS: 650.0000 mg | ORAL_TABLET | Freq: Four times a day (QID) | ORAL | Status: DC | PRN

## 2022-03-01 MED ORDER — POLYETHYLENE GLYCOL 3350 17 G PO PACK: 17.0000 g | PACK | Freq: Every day | ORAL | Status: DC | PRN

## 2022-03-01 NOTE — Assessment & Plan Note (Signed)
.   Resume home regimen of Synthroid 

## 2022-03-01 NOTE — Assessment & Plan Note (Signed)
   Known history of SIADH  Mild hyponatremia on arrival  We will avoid aggressive hydration  Monitor sodium levels with serial chemistries

## 2022-03-01 NOTE — ED Triage Notes (Signed)
Pt BIB EMS from home due to stroke symptoms. Pt lsn was last night. Pt was having left sided weakness and slurred speech.

## 2022-03-01 NOTE — ED Notes (Signed)
Pt provided with water. Bedside swallow screen passed

## 2022-03-01 NOTE — ED Notes (Signed)
IV team at bedside 

## 2022-03-01 NOTE — Assessment & Plan Note (Signed)
   Family is also concerned about waxing and waning confusion  Urinalysis seems to be suggestive of a recurrent urinary tract infection although patient is not exhibiting any additional symptoms  We will treat for suspected urinary tract infection as patient has a history of frequent urinary tract infections with the last several urine cultures growing out Klebsiella, Morganella Morgagni and E. Coli  ER provider has already initiated intravenous ciprofloxacin.  Due to concerns that this could contribute to the patient's confusion we will transition this to Zosyn for now until we receive final urine culture results.

## 2022-03-01 NOTE — ED Provider Notes (Signed)
I assumed care from Dr. Langston Masker.  MRI today does show to small subacute to acute infarcts.  Daughter who is at bedside reported that she was normal yesterday when she walked in today she noticed a facial droop and slurred speech.  Patient is also receiving treatment for UTI and urine culture is pending.  Spoke with Dr. Cristi Loron with neurology for consult and will admit to the hospitalist service.  Findings were discussed with patient's family and they are comfortable with this plan.   Blanchie Dessert, MD 03/01/22 2059

## 2022-03-01 NOTE — Assessment & Plan Note (Addendum)
   MRI imaging reveals multiple small areas of increased signal on diffusion-weighted imaging consistent with acute or subacute infarction in the right frontal white matter and right parietal white matter with area of increased signal in the right insula.  On exam patient still exhibits the following deficit: Left facial droop  Performing serial neurologic checks  Monitoring patient on telemetry  Initiating antiplatelet therapy including aspirin '325mg'$  now followed by '81mg'$  daily.  Will defer to neurology on whether Plavix should also be added.  Daily statin therapy will be initiated if LDL is greater than 70  Further imaging to include: CT Angiogram of the head and neck.   Obtaining hemoglobin A1c and lipid panel in the morning  Echocardiogram with bubble in the morning  PT, OT, SLP evaluation  Permissive hypertension with as needed antihypertensives only to be given if blood pressure greater than 220/115  ER provider discussed case with Dr. Curly Shores with Neurology who will be following consultation, their input is appreciated.

## 2022-03-01 NOTE — ED Notes (Signed)
RN changed pt...diaper wet. New diaper applied.

## 2022-03-01 NOTE — Assessment & Plan Note (Signed)
·   Permissive hypertension for now °· Holding home regimen of oral antihypertensives °· As needed intravenous intermittent is for markedly elevated blood pressure of greater than 220/115 ° °

## 2022-03-01 NOTE — ED Notes (Signed)
Pt taken to MRI  

## 2022-03-01 NOTE — ED Provider Notes (Signed)
[facility] EM[facility]GENCY DEPARTMENT Provider Note   CSN: [id number] Arrival date & time: 03/01/22  1301     History {Add pertinent medical, surgical, social history, OB history to HPI:1} Chief Complaint  Patient presents with   Stroke Symptoms    UVA RUNKEL is a 88 y.o. female present emergency department with concern for generalized confusion and left-sided weakness.  Supplemental history is provided by the patient's son and granddaughter at the bedside.  The patient typically lives with her husband independently in the home.  She was last noted to be behaving normally around 10 PM yesterday evening.  This morning family members found her speaking confusedly, appeared to have left-sided facial droop and some weakness of the left side of her body.  She was complaining of a headache earlier but is no longer complaining of headache.  Typically they say she is very lucid, does not have any issues with dementia, and she has not had any problems with stroke or mini stroke.  She does have hypertension.  She is also on a Synthroid medication she does not take any other medications, including cholesterol medicine.  She does have a history of UTIs which can cause confusion in the past.  HPI     Home Medications Prior to Admission medications   Medication Sig Start Date End Date Taking? Authorizing Provider  acetaminophen (TYLENOL) 325 MG tablet Take 2 tablets (650 mg total) by mouth every 6 (six) hours as needed for mild pain (or Fever >/= 101). 02/07/22   Angiulli, Lavon Paganini, PA-C  amLODipine (NORVASC) 2.5 MG tablet Take 1 tablet (2.5 mg total) by mouth daily. Patient not taking: Reported on 02/20/2022 02/07/22   Angiulli, Lavon Paganini, PA-C  Calcium 250 MG CAPS Take 250 mg by mouth daily.    [provider]  dorzolamide-timolol (COSOPT) 22.3-6.8 MG/ML ophthalmic solution Place 1 drop into both eyes 2 (two) times daily. 01/12/22   [provider]  latanoprost  (XALATAN) 0.005 % ophthalmic solution Place 1 drop into both eyes at bedtime. 01/11/15   [provider]  levothyroxine (SYNTHROID) 88 MCG tablet Take 1 tablet (88 mcg total) by mouth daily before breakfast. 02/07/22   Angiulli, Lavon Paganini, PA-C  meclizine (ANTIVERT) 12.5 MG tablet Take 1 tablet (12.5 mg total) by mouth daily. 02/20/22   Jennye Boroughs, MD  Multiple Vitamin (MULTIVITAMIN WITH MINERALS) TABS tablet Take 1 tablet by mouth daily.    [provider]  polyethylene glycol (MIRALAX / GLYCOLAX) 17 g packet Take 17 g by mouth daily. 02/01/22   Thurnell Lose, MD      Allergies    Patient has no known allergies.    Review of Systems   Review of Systems  Physical Exam Updated Vital Signs BP (!) 168/85 (BP Location: Right Arm)   Pulse 75   Temp 98.9 F (37.2 C) (Oral)   Resp 18   SpO2 100%  Physical Exam Constitutional:      General: She is not in acute distress. HENT:     Head: Normocephalic and atraumatic.  Eyes:     Conjunctiva/sclera: Conjunctivae normal.     Pupils: Pupils are equal, round, and reactive to light.  Cardiovascular:     Rate and Rhythm: Normal rate and regular rhythm.  Pulmonary:     Effort: Pulmonary effort is normal. No respiratory distress.  Abdominal:     General: There is no distension.     Tenderness: There is no abdominal tenderness.  Skin:    General: Skin is warm and dry.  Neurological:     Mental Status: She is alert.     Comments: Left-sided facial droop, CN otherwise grossly intact AAO x 4 Slow speech, family reports she is not cognitively the same as yesterday Some mild word confusion No pronator drift, equal bilateral strength, no paresthesias reported by the patient  Psychiatric:        Mood and Affect: Mood normal.        Behavior: Behavior normal.    ED Results / Procedures / Treatments   Labs (all labs ordered are listed, but only abnormal results are displayed) Labs Reviewed  BASIC METABOLIC PANEL  CBC  WITH DIFFERENTIAL/PLATELET  URINALYSIS, ROUTINE W REFLEX MICROSCOPIC  I-STAT CHEM 8, ED  TROPONIN I (HIGH SENSITIVITY)    EKG None  Radiology No results found.  Procedures Procedures  {Document cardiac monitor, telemetry assessment procedure when appropriate:1}  Medications Ordered in ED Medications - No data to display  ED Course/ Medical Decision Making/ A&P Clinical Course as of 03/01/22 1331  Wed Mar 01, 2022  1330 Patient assessed at the bedside, supplemental history provided by family.  Her last known well would have been approximately 10 PM yesterday evening.  She does not demonstrate LVO criteria on arrival.   [MT]    Clinical Course User Index [MT] Joan Herschberger, Carola Rhine, MD                           Medical Decision Making Amount and/or Complexity of Data Reviewed Labs: ordered. Radiology: ordered. ECG/medicine tests: ordered.   This patient presents to the Emergency Department with complaint of altered mental status.  This involves an extensive number of treatment options, and is a complaint that carries with it a high risk of complications and morbidity.  The differential diagnosis includes hypoglycemia vs metabolic encephalopathy vs infection (including cystitis) vs ICH vs stroke vs polypharmacy vs other  I ordered, reviewed, and interpreted labs, including *** I ordered medication *** for *** I ordered imaging studies which included ***  I independently visualized and interpreted imaging which showed *** and the monitor tracing which showed *** Additional history was obtained from *** Previous records obtained and reviewed showing *** I personally reviewed the patients ECG which showed sinus rhythm with no acute ischemic findings***  I consulted *** and discussed lab and imaging findings  After the interventions stated above, I reevaluated the patient and found ***    {Document critical care time when appropriate:1} {Document review of labs and clinical  decision tools ie heart score, Chads2Vasc2 etc:1}  {Document your independent review of radiology images, and any outside records:1} {Document your discussion with family members, caretakers, and with consultants:1} {Document social determinants of health affecting pt's care:1} {Document your decision making why or why not admission, treatments were needed:1} Final Clinical Impression(s) / ED Diagnoses Final diagnoses:  None    Rx / DC Orders ED Discharge Orders     None

## 2022-03-01 NOTE — Assessment & Plan Note (Signed)
.   Patient is exhibiting waxing and waning confusion, a marked change from baseline mentation and activity according to family . This state has been developing over a 1 day span of time . Patient is likely suffering from acute metabolic encephalopathy multifactorial secondary to underlying urinary tract infection and acute stroke . Treating patient with intravenous Zosyn considering patient's multiple isolated organisms with urinary tract infections over the past 2 months.  . Will expand work-up of encephalopathy if patient fails to rapidly improve.

## 2022-03-01 NOTE — Assessment & Plan Note (Signed)
   Continue home regimen of eyedrops

## 2022-03-01 NOTE — H&P (Signed)
History and Physical    Patient: Mackenzie Madden MRN: [id number] [place]: 03/01/2022  Date of Service: the patient was seen and examined on 03/01/2022  Patient coming from: Home via EMS  Chief Complaint:  Chief Complaint  Patient presents with   Stroke Symptoms    HPI:   88 year old female with past medical history of diastolic congestive heart failure (Echo 2016 G1DD), hypertension, hypothyroidism, SIADH, recurrent urinary tract infections, vertigo and recent hospitalization for fall and left wrist fracture at [facility] who presents to [facility] emergency department via EMS due to concerns for left-sided weakness and slurred speech.  Majority the history has been obtained from discussions with family and emergency department staff.    Last known normal neurologic function was approximately 10 PM yesterday evening according to family.  This morning family members found the patient confused using nonsensical speech and appearing to exhibit left facial droop and left-sided extremity weakness.  As of late the patient has not been exhibiting any associated nausea, vomiting, diarrhea, fevers or complaints of abdominal pain.  Symptoms persisted throughout the morning and EMS was eventually contacted at approximately 1 PM.  Upon EMS arrival they confirmed the neurologic deficits and proceeded to bring the patient into [facility] for evaluation.  Upon evaluation in the emergency department patient was noted to be outside the tPA window.  Patient underwent a broad work-up including MRI brain without contrast revealing multiple subacute or acute infarctions in the right frontal white matter and right parietal white matter.  Additionally, urinalysis found to be suggestive of a urinary tract infection with greater than 50 white blood cells per high-powered field.  MRI images were discussed with Dr. Curly Shores with neurology who agreed to consult on the patient.  Patient was additionally  placed on intravenous ciprofloxacin based on review of previous urine cultures.  The hospitalist group is now been called to assess the patient for admission to the hospital.  Review of Systems: Review of Systems  Unable to perform ROS: Mental status change    Past Medical History:  Diagnosis Date   Chronic diastolic heart failure ([place])    Essential hypertension 02/21/2015   Glaucoma    HOH (hard of hearing)    no hearing aids   Hypothyroidism    SIADH (syndrome of inappropriate ADH production) ([place]) 03/01/2022    Past Surgical History:  Procedure Laterality Date   ABDOMINAL HYSTERECTOMY  2010   CHOLECYSTECTOMY  2000   CLOSED REDUCTION WRIST FRACTURE Left 01/24/2022   Procedure: CLOSED REDUCTION WRIST;  Surgeon: Orene Desanctis, MD;  Location: [place];  Service: Orthopedics;  Laterality: Left;   HIP PINNING,CANNULATED Right 08/05/2019   Procedure: CANNULATED HIP PINNING;  Surgeon: Mcarthur Rossetti, MD;  Location: [facility];  Service: Orthopedics;  Laterality: Right;   I & D EXTREMITY Left 01/24/2022   Procedure: IRRIGATION AND DEBRIDEMENT EXTREMITY;LEFT distal radius open reduction internal fixation, INTRA- articular, 3 fragments;  Surgeon: Orene Desanctis, MD;  Location: [place];  Service: Orthopedics;  Laterality: Left;   ORIF WRIST FRACTURE Left 01/24/2022   Procedure: OPEN REDUCTION INTERNA FIXATION OF LEFT WRIST FRACTURE; LEFT wrist radiographs four views with intraoperative interpretation;  Surgeon: Orene Desanctis, MD;  Location: [place];  Service: Orthopedics;  Laterality: Left;   SPLENECTOMY, TOTAL      Social History:  reports that she has never smoked. She has never used smokeless tobacco. She reports that she does not drink alcohol and does not use drugs.  No Known  Allergies  Family History  Problem Relation Age of Onset   CAD Mother    Kidney cancer Mother    Heart attack Father    Colon cancer Neg Hx    Esophageal cancer Neg Hx    Stomach cancer Neg Hx    Rectal cancer Neg  Hx     Prior to Admission medications   Medication Sig Start Date End Date Taking? Authorizing Provider  acetaminophen (TYLENOL) 325 MG tablet Take 2 tablets (650 mg total) by mouth every 6 (six) hours as needed for mild pain (or Fever >/= 101). 02/07/22  Yes Angiulli, Lavon Paganini, PA-C  Calcium 250 MG CAPS Take 250 mg by mouth daily.   Yes [provider]  docusate sodium (COLACE) 100 MG capsule Take 100 mg by mouth 2 (two) times daily.   Yes [provider]  dorzolamide-timolol (COSOPT) 22.3-6.8 MG/ML ophthalmic solution Place 1 drop into both eyes 2 (two) times daily. 01/12/22  Yes [provider]  latanoprost (XALATAN) 0.005 % ophthalmic solution Place 1 drop into both eyes at bedtime. 01/11/15  Yes [provider]  levothyroxine (SYNTHROID) 88 MCG tablet Take 1 tablet (88 mcg total) by mouth daily before breakfast. 02/07/22  Yes Angiulli, Lavon Paganini, PA-C  meclizine (ANTIVERT) 12.5 MG tablet Take 1 tablet (12.5 mg total) by mouth daily. 02/20/22  Yes Jennye Boroughs, MD  Multiple Vitamin (MULTIVITAMIN WITH MINERALS) TABS tablet Take 1 tablet by mouth daily.   Yes [provider]  amLODipine (NORVASC) 2.5 MG tablet Take 1 tablet (2.5 mg total) by mouth daily. Patient not taking: Reported on 02/20/2022 02/07/22   Angiulli, Lavon Paganini, PA-C  polyethylene glycol (MIRALAX / GLYCOLAX) 17 g packet Take 17 g by mouth daily. Patient not taking: Reported on 03/01/2022 02/01/22   Thurnell Lose, MD    Physical Exam:  Vitals:   03/01/22 1845 03/01/22 2115 03/01/22 2230 03/01/22 2300  BP: (!) 160/89 (!) 172/80 131/81 (!) 149/78  Pulse: 80 84 78 89  Resp: '18 20 19 18  '$ Temp:      TempSrc:      SpO2: 97% 98% 97% 100%    Constitutional: Lethargic but arousable, oriented x 1, no associated distress.   Skin: no rashes, no lesions, poor skin turgor noted. Eyes: Pupils are equally reactive to light.  No evidence of scleral icterus or conjunctival pallor.  ENMT: Moist  mucous membranes noted.  Posterior pharynx clear of any exudate or lesions.   Neck: normal, supple, no masses, no thyromegaly.  No evidence of jugular venous distension.   Respiratory: clear to auscultation bilaterally, no wheezing, no crackles. Normal respiratory effort. No accessory muscle use.  Cardiovascular: Regular rate and rhythm, no murmurs / rubs / gallops. No extremity edema. 2+ pedal pulses. No carotid bruits.  Chest:   Nontender without crepitus or deformity.   Back:   Nontender without crepitus or deformity. Abdomen: Abdomen is soft and nontender.  No evidence of intra-abdominal masses.  Positive bowel sounds noted in all quadrants.   Musculoskeletal: Pain with passive/active ROM of LUE.   Good ROM, no contractures. Normal muscle tone.  Neurologic: Lethargic but arousable, oriented x 1, Notable left facial droop. Sensation intact.  Patient moving all 4 extremities spontaneously.  Patient is intermittently following commands.  Patient is responsive to verbal stimuli.   Psychiatric: unable to assess due to confusion.  Patient currently doesn't seem to possess insight as to their current situation.    Data Reviewed:  I have  personally reviewed and interpreted labs, imaging.  Significant findings are   MRI brain without contrast revealing multiple small areas of increased signal on diffusion-weighted imaging consistent with acute or subacute infarction in the right frontal white matter and right parietal white matter with small area of increased signal in the right insula. Chemistry revealing sodium 133, BUN 6, creatinine 0.77. CBC revealing white blood cell count of 9.2, hemoglobin 13, hematocrit 39.4, platelet count 144. Serial troponins were obtained and were found to be 13 and 14. Urinalysis revealing specific gravity of 1.004 with positive nitrites, large leukocytes and greater than 50 white blood cells per high-powered field with many bacteria.   EKG: Personally reviewed.   Rhythm is Normal sinus rhythm with heart rate of 83BPM. Left bundle branch block noted with no dynamic ST segment changes appreciated.   Assessment and Plan: * Acute stroke due to ischemia [facility]) MRI imaging reveals multiple small areas of increased signal on diffusion-weighted imaging consistent with acute or subacute infarction in the right frontal white matter and right parietal white matter with area of increased signal in the right insula. On exam patient still exhibits the following deficit: Left facial droop Performing serial neurologic checks Monitoring patient on telemetry Initiating antiplatelet therapy including aspirin '325mg'$  now followed by '81mg'$  daily.  Will defer to neurology on whether Plavix should also be added. Daily statin therapy will be initiated if LDL is greater than 70 Further imaging to include: CT Angiogram of the head and neck.  Obtaining hemoglobin A1c and lipid panel in the morning Echocardiogram with bubble in the morning PT, OT, SLP evaluation Permissive hypertension with as needed antihypertensives only to be given if blood pressure greater than 220/115 ER provider discussed case with Dr. Curly Shores with Neurology who will be following consultation, their input is appreciated.   Acute cystitis without hematuria Family is also concerned about waxing and waning confusion Urinalysis seems to be suggestive of a recurrent urinary tract infection although patient is not exhibiting any additional symptoms We will treat for suspected urinary tract infection as patient has a history of frequent urinary tract infections with the last several urine cultures growing out Klebsiella, Morganella Morgagni and E. Coli ER provider has already initiated intravenous ciprofloxacin.  Due to concerns that this could contribute to the patient's confusion we will transition this to Zosyn for now until we receive final urine culture results.  Acute metabolic encephalopathy Patient is  exhibiting waxing and waning confusion, a marked change from baseline mentation and activity according to family This state has been developing over a 1 day span of time Patient is likely suffering from acute metabolic encephalopathy multifactorial secondary to underlying urinary tract infection and acute stroke Treating patient with intravenous Zosyn considering patient's multiple isolated organisms with urinary tract infections over the past 2 months.  Will expand work-up of encephalopathy if patient fails to rapidly improve.   SIADH (syndrome of inappropriate ADH production) (HCC) Known history of SIADH Mild hyponatremia on arrival We will avoid aggressive hydration Monitor sodium levels with serial chemistries  Chronic diastolic heart failure (HCC) No clinical evidence of cardiogenic volume overload Strict input and output monitoring Daily weights Low-sodium diet   Essential hypertension Permissive hypertension for now Holding home regimen of oral antihypertensives As needed intravenous intermittent is for markedly elevated blood pressure of greater than 220/115   Glaucoma Continue home regimen of eyedrops  Hypothyroidism Resume home regimen of Synthroid         Code Status:  Full code  code status decision has been confirmed with: husband Family Communication: Husband at bedside has been updated on plan of care.    Consults: Dr. Curly Shores with Neurology  Severity of Illness:  The appropriate patient status for this patient is INPATIENT. Inpatient status is judged to be reasonable and necessary in order to provide the required intensity of service to ensure the patient's safety. The patient's presenting symptoms, physical exam findings, and initial radiographic and laboratory data in the context of their chronic comorbidities is felt to place them at high risk for further clinical deterioration. Furthermore, it is not anticipated that the patient will be medically  stable for discharge from the hospital within 2 midnights of admission.   * I certify that at the point of admission it is my clinical judgment that the patient will require inpatient hospital care spanning beyond 2 midnights from the point of admission due to high intensity of service, high risk for further deterioration and high frequency of surveillance required.*  Author:  Vernelle Emerald MD  03/01/2022 11:53 PM

## 2022-03-01 NOTE — Assessment & Plan Note (Signed)
No clinical evidence of cardiogenic volume overload Strict input and output monitoring Daily weights Low-sodium diet  

## 2022-03-02 ENCOUNTER — Inpatient Hospital Stay (HOSPITAL_COMMUNITY): Payer: Medicare Other

## 2022-03-02 ENCOUNTER — Other Ambulatory Visit: Payer: Self-pay | Admitting: Student

## 2022-03-02 ENCOUNTER — Inpatient Hospital Stay (HOSPITAL_BASED_OUTPATIENT_CLINIC_OR_DEPARTMENT_OTHER): Payer: Medicare Other

## 2022-03-02 DIAGNOSIS — I4891 Unspecified atrial fibrillation: Secondary | ICD-10-CM

## 2022-03-02 DIAGNOSIS — I6389 Other cerebral infarction: Secondary | ICD-10-CM | POA: Diagnosis not present

## 2022-03-02 DIAGNOSIS — I639 Cerebral infarction, unspecified: Secondary | ICD-10-CM | POA: Diagnosis not present

## 2022-03-02 DIAGNOSIS — I255 Ischemic cardiomyopathy: Secondary | ICD-10-CM

## 2022-03-02 LAB — CBC WITH DIFFERENTIAL/PLATELET
Abs Immature Granulocytes: 0.02 10*3/uL (ref 0.00–0.07)
Basophils Absolute: 0.1 10*3/uL (ref 0.0–0.1)
Basophils Relative: 1 %
Eosinophils Absolute: 0.2 10*3/uL (ref 0.0–0.5)
Eosinophils Relative: 2 %
HCT: 35 % — ABNORMAL LOW (ref 36.0–46.0)
Hemoglobin: 11.6 g/dL — ABNORMAL LOW (ref 12.0–15.0)
Immature Granulocytes: 0 %
Lymphocytes Relative: 28 %
Lymphs Abs: 2.4 10*3/uL (ref 0.7–4.0)
MCH: 32.1 pg (ref 26.0–34.0)
MCHC: 33.1 g/dL (ref 30.0–36.0)
MCV: 97 fL (ref 80.0–100.0)
Monocytes Absolute: 1.1 10*3/uL — ABNORMAL HIGH (ref 0.1–1.0)
Monocytes Relative: 12 %
Neutro Abs: 5 10*3/uL (ref 1.7–7.7)
Neutrophils Relative %: 57 %
Platelets: 153 10*3/uL (ref 150–400)
RBC: 3.61 MIL/uL — ABNORMAL LOW (ref 3.87–5.11)
RDW: 13.5 % (ref 11.5–15.5)
WBC: 8.8 10*3/uL (ref 4.0–10.5)
nRBC: 0 % (ref 0.0–0.2)

## 2022-03-02 LAB — TSH: TSH: 6.258 u[IU]/mL — ABNORMAL HIGH (ref 0.350–4.500)

## 2022-03-02 LAB — MAGNESIUM: Magnesium: 1.6 mg/dL — ABNORMAL LOW (ref 1.7–2.4)

## 2022-03-02 LAB — COMPREHENSIVE METABOLIC PANEL
ALT: 10 U/L (ref 0–44)
AST: 25 U/L (ref 15–41)
Albumin: 2.6 g/dL — ABNORMAL LOW (ref 3.5–5.0)
Alkaline Phosphatase: 66 U/L (ref 38–126)
Anion gap: 7 (ref 5–15)
BUN: 6 mg/dL — ABNORMAL LOW (ref 8–23)
CO2: 25 mmol/L (ref 22–32)
Calcium: 8.8 mg/dL — ABNORMAL LOW (ref 8.9–10.3)
Chloride: 103 mmol/L (ref 98–111)
Creatinine, Ser: 0.79 mg/dL (ref 0.44–1.00)
GFR, Estimated: >60 mL/min (ref >60)
Glucose, Bld: 100 mg/dL — ABNORMAL HIGH (ref 70–99)
Potassium: 3.7 mmol/L (ref 3.5–5.1)
Sodium: 135 mmol/L (ref 135–145)
Total Bilirubin: 1 mg/dL (ref 0.3–1.2)
Total Protein: 5.7 g/dL — ABNORMAL LOW (ref 6.5–8.1)

## 2022-03-02 LAB — ECHOCARDIOGRAM COMPLETE BUBBLE STUDY
AR max vel: 1.59 cm2
AV Peak grad: 11.4 mmHg
Ao pk vel: 1.69 m/s
Area-P 1/2: 4.68 cm2
P 1/2 time: 691 msec
S' Lateral: 3.3 cm

## 2022-03-02 LAB — VITAMIN B12: Vitamin B-12: 488 pg/mL (ref 180–914)

## 2022-03-02 LAB — AMMONIA: Ammonia: 22 umol/L (ref 9–35)

## 2022-03-02 LAB — LIPID PANEL
Cholesterol: 136 mg/dL (ref 0–200)
HDL: 36 mg/dL — ABNORMAL LOW (ref >40)
LDL Cholesterol: 88 mg/dL (ref 0–99)
Total CHOL/HDL Ratio: 3.8 RATIO
Triglycerides: 59 mg/dL (ref <150)
VLDL: 12 mg/dL (ref 0–40)

## 2022-03-02 LAB — HEMOGLOBIN A1C
Hgb A1c MFr Bld: 4.7 % — ABNORMAL LOW (ref 4.8–5.6)
Mean Plasma Glucose: 88.19 mg/dL

## 2022-03-02 MED ORDER — MAGNESIUM SULFATE 2 GM/50ML IV SOLN
2.0000 g | Freq: Once | INTRAVENOUS | Status: AC
  Administered 2022-03-02: 2 g via INTRAVENOUS
  Filled 2022-03-02: qty 50

## 2022-03-02 MED ORDER — AMLODIPINE BESYLATE 5 MG PO TABS: 5.0000 mg | ORAL_TABLET | Freq: Every day | ORAL | 11 refills | Status: AC

## 2022-03-02 MED ORDER — ASPIRIN 81 MG PO TBEC: 81.0000 mg | DELAYED_RELEASE_TABLET | Freq: Every day | ORAL | 12 refills | Status: DC

## 2022-03-02 MED ORDER — PIPERACILLIN-TAZOBACTAM 3.375 G IVPB
3.3750 g | Freq: Three times a day (TID) | INTRAVENOUS | Status: DC
  Administered 2022-03-02 (×2): 3.375 g via INTRAVENOUS
  Filled 2022-03-02 (×2): qty 50

## 2022-03-02 MED ORDER — ROSUVASTATIN CALCIUM 10 MG PO TABS: 10.0000 mg | ORAL_TABLET | Freq: Every day | ORAL | 11 refills | Status: AC

## 2022-03-02 MED ORDER — ROSUVASTATIN CALCIUM 40 MG PO TABS: 40.0000 mg | ORAL_TABLET | Freq: Every day | ORAL | 11 refills | Status: DC

## 2022-03-02 MED ORDER — ROSUVASTATIN CALCIUM 10 MG PO TABS: 40.0000 mg | ORAL_TABLET | Freq: Every day | ORAL | 11 refills | Status: DC

## 2022-03-02 MED ORDER — CLOPIDOGREL BISULFATE 75 MG PO TABS: 75.0000 mg | ORAL_TABLET | Freq: Every day | ORAL | Status: DC

## 2022-03-02 MED ORDER — CLOPIDOGREL BISULFATE 300 MG PO TABS
300.0000 mg | ORAL_TABLET | Freq: Once | ORAL | Status: AC
  Administered 2022-03-02: 300 mg via ORAL
  Filled 2022-03-02: qty 1

## 2022-03-02 MED ORDER — CLOPIDOGREL BISULFATE 75 MG PO TABS: 75.0000 mg | ORAL_TABLET | Freq: Every day | ORAL | 0 refills | Status: DC

## 2022-03-02 MED ORDER — IOHEXOL 350 MG/ML SOLN
65.0000 mL | Freq: Once | INTRAVENOUS | Status: AC | PRN
  Administered 2022-03-02: 65 mL via INTRAVENOUS

## 2022-03-02 MED ORDER — ROSUVASTATIN CALCIUM 5 MG PO TABS
10.0000 mg | ORAL_TABLET | Freq: Every day | ORAL | Status: DC
  Administered 2022-03-02: 10 mg via ORAL
  Filled 2022-03-02: qty 2

## 2022-03-02 NOTE — Hospital Course (Signed)
  88 year old white female-at baseline independent with dressing toileting HFpEF based on echo 2016 grade 1 DD-chronic LBBB Myoview 2017-symptoms were thought to be secondary to GERD and reflux-at that admission she was found to have hyperintense spleen?  Splenic mass-unclear if this was further characterized HTN Hypothyroid Prior pubic ramus fracture after fall in 2019 with concerns at the time for pelvic hematoma and bladder injury which was unfounded Acute right subcapital femoral fracture with impaction in 2020 repaired by Dr. Ninfa Linden Prior underlying SIADH Recent hospitalization 4/24-01/31/2022--vertigo causing left distal open radial fracture status post Rx--ORIF Dr. Greta Doom 01/25/2022-transitioned to CIR--that admission E. coli Rx cephalosporins for E. coli cystitis also had asymptomatic transaminitis at the time   She went to inpatient rehab recovered well and was discharged    Patient represented to [facility], ED 5/31 left-sided weakness generalized confusion left-sided droop left upper extremity weakness with headache-MRI = small subacute to acute infarcts Neurologist consulted Found to have again concerns for urinary tract infection started on Zosyn   BUNs/creatinine 6/0.7 Magnesium 1.6 albumin 2.6 WBC eight 8.8 vitamin B12 488 A1c 4.7 TSH 6.25 CTA neck = negative for large occlusion atheromatous changes carotidsiphons-diffuse tortuosity dolichoectasia of neck suggesting chronic underlying HTN multifocal irregularities 2/2 HTN

## 2022-03-02 NOTE — ED Notes (Signed)
Pt now fully awake, daughter at bedside assisting with pt eating a banana, pt able to take banana in her and feed self. No choking or chewing difficulty noted.

## 2022-03-02 NOTE — Consult Note (Signed)
Neurology Consultation Reason for Consult: Strokes on MRI Requesting Physician: Dolly Rias   CC: Left-sided weakness and slurred speech  History is obtained from: Husband at bedside, patient and chart review  HPI: Mackenzie Madden is a 88 y.o. female presenting with left facial droop with a past medical history significant for hypertension, glaucoma, pubic fracture (3 years ago) SIADH, recurrent UTIs.  She is reportedly normally independent with dressing and toileting, but has been having some bouts of vertigo over the last 2 weeks as well as a recent fall on 01/23/2022 (ground-level, no loss of consciousness).  On that admission her head CT and MRI were negative, but she had a left wrist fracture as well as ulnar fracture with repair on 01/24/2021; she has had some pain/weakness in that arm secondary to fracture  She was last her neurological normal at 10 PM on 6/1 and confused with nonsensical speech left facial droop and worsened gait impairment today.  Husband does report that she had some dizziness prior to her fall on 01/23/2022  LKW: 10 PM on 6/1 tPA given?: No, out of the window Premorbid modified rankin scale:      2 - Slight disability. Able to look after own affairs without assistance, but unable to carry out all previous activities.  ROS: Somewhat limited secondary to patient being hard of hearing and mildly confused, but pertinent findings in HPI as above  Past Medical History:  Diagnosis Date   Chronic diastolic heart failure ([place])    Essential hypertension 02/21/2015   Glaucoma    HOH (hard of hearing)    no hearing aids   Hypothyroidism    SIADH (syndrome of inappropriate ADH production) ([place]) 03/01/2022   Past Surgical History:  Procedure Laterality Date   ABDOMINAL HYSTERECTOMY  2010   CHOLECYSTECTOMY  2000   CLOSED REDUCTION WRIST FRACTURE Left 01/24/2022   Procedure: CLOSED REDUCTION WRIST;  Surgeon: Orene Desanctis, MD;  Location: [place];  Service: Orthopedics;   Laterality: Left;   HIP PINNING,CANNULATED Right 08/05/2019   Procedure: CANNULATED HIP PINNING;  Surgeon: Mcarthur Rossetti, MD;  Location[facility];  Service: Orthopedics;  Laterality: Right;   I & D EXTREMITY Left 01/24/2022   Procedure: IRRIGATION AND DEBRIDEMENT EXTREMITY;LEFT distal radius open reduction internal fixation, INTRA- articular, 3 fragments;  Surgeon: Orene Desanctis, MD;  Location: [place];  Service: Orthopedics;  Laterality: Left;   ORIF WRIST FRACTURE Left 01/24/2022   Procedure: OPEN REDUCTION INTERNA FIXATION OF LEFT WRIST FRACTURE; LEFT wrist radiographs four views with intraoperative interpretation;  Surgeon: Orene Desanctis, MD;  Location: [place];  Service: Orthopedics;  Laterality: Left;   SPLENECTOMY, TOTAL     Current Outpatient Medications  Medication Instructions   acetaminophen (TYLENOL) 650 mg, Oral, Every 6 hours PRN   amLODipine (NORVASC) 2.5 mg, Oral, Daily   Calcium 250 mg, Oral, Daily   docusate sodium (COLACE) 100 mg, Oral, 2 times daily   dorzolamide-timolol (COSOPT) 22.3-6.8 MG/ML ophthalmic solution 1 drop, Both Eyes, 2 times daily   latanoprost (XALATAN) 0.005 % ophthalmic solution 1 drop, Both Eyes, Daily at bedtime   levothyroxine (SYNTHROID) 88 mcg, Oral, Daily before breakfast   meclizine (ANTIVERT) 12.5 mg, Oral, Daily   Multiple Vitamin (MULTIVITAMIN WITH MINERALS) TABS tablet 1 tablet, Oral, Daily   polyethylene glycol (MIRALAX / GLYCOLAX) 17 g, Oral, Daily    Family History  Problem Relation Age of Onset   CAD Mother    Kidney cancer Mother    Heart attack Father  Colon cancer Neg Hx    Esophageal cancer Neg Hx    Stomach cancer Neg Hx    Rectal cancer Neg Hx     Social History:  reports that she has never smoked. She has never used smokeless tobacco. She reports that she does not drink alcohol and does not use drugs.   Exam: Current vital signs: BP (!) 149/78   Pulse 89   Temp 98.9 F (37.2 C) (Oral)   Resp 18   SpO2 100%   Vital signs in last 24 hours: Temp:  [98.9 F (37.2 C)] 98.9 F (37.2 C) (05/31 1307) Pulse Rate:  [63-91] 89 (05/31 2300) Resp:  [14-28] 18 (05/31 2300) BP: (131-173)/(73-148) 149/78 (05/31 2300) SpO2:  [90 %-100 %] 100 % (05/31 2300)   Physical Exam  Constitutional: Appears frail, no acute distress Psych: Affect pleasant and cooperative Eyes: No scleral injection HENT: No oropharyngeal obstruction.  MSK: Left upper extremity cast Cardiovascular: Normal rate and regular rhythm. Respiratory: Effort normal, non-labored breathing GI: Soft.  No distension. There is no tenderness.  Skin: Warm dry and intact visible skin  Neuro: Mental Status: Patient is awake, alert, oriented to person, place, month, year, and situation. Patient is gives some limited history, but at times her answers are tangential or difficult to understand.  Language remain examination is also significantly limited by her being hard of hearing at baseline.  However she is able to name and repeat.  No signs of neglect. Cranial Nerves: II: Visual Fields are full. Pupils are equal, round, and reactive to light.  III,IV, VI: EOMI without ptosis or diploplia.  V: Facial sensation is symmetric to light eyelash brush VII: Facial movement is notable for a left facial droop, somewhat intermittent.  VIII: hearing is extremely hard of hearing at baseline X: Uvula elevates symmetrically XI: Turns head in both directions freely XII: tongue is midline without atrophy or fasciculations.  Motor: Tone is normal. Bulk is normal.  Confrontational strength testing limited by inattention.  She is able to maintain bilateral upper extremities antigravity for 10 seconds but does not supinate either of them.  On the lower extremities she does not maintain them antigravity but repeatedly kicks up both of her lower legs and seems to move them grossly equally Sensory: Equal reaction to touch in all 4 extremities Plantars: Toes are  downgoing bilaterally.  Cerebellar: FNF intact bilaterally Gait:  Deferred   NIHSS total 7  one-point for left facial droop, 2 points for left lower leg weakness, 2 points for right lower leg weakness, one-point for mild aphasia, one-point for mild dysarthria Performed at 315 AM    I have reviewed labs in epic and the results pertinent to this consultation are:  Basic Metabolic Panel: Recent Labs  Lab 03/01/22 1351 03/01/22 1422  NA 133* 132*  K 4.5 4.6  CL 100 97*  CO2 26  --   GLUCOSE 98 94  BUN 6* 6*  CREATININE 0.77 0.70  CALCIUM 9.8  --     CBC: Recent Labs  Lab 03/01/22 1351 03/01/22 1422  WBC 9.2  --   NEUTROABS 5.5  --   HGB 13.0 13.6  HCT 39.4 40.0  MCV 98.5  --   PLT 144*  --     Coagulation Studies: No results for input(s): LABPROT, INR in the last 72 hours.   No results found for: VITAMINB12  Lab Results  Component Value Date   TSH 0.788 06/04/2016     I have  reviewed the images obtained:  MRI brain personally reviewed, agree with radiology that there are multiple areas of small acute to subacute infarcts which were not present on 01/24/2022 and involves bilateral cerebral hemispheres  CTA personally reviewed, agree with radiology:  1. Negative CTA for large vessel occlusion or other emergent finding. 2. Atheromatous change about the carotid siphons with associated mild to moderate narrowing on the right. No other hemodynamically significant or correctable stenosis about the major arterial vasculature of the head and neck. 3. Diffuse tortuosity and dolichoectasia of the major arterial vasculature of the neck, suggesting chronic underlying hypertension. Multifocal irregularity of both cervical ICAs favored to be hypertensive in nature, although underlying FMD could also have this appearance.  Impression: 88 year old woman with recent left wrist fracture presenting with multifocal strokes, given bihemispheric location, concern for central  embolic source  Suspect an overlying delirium/dementia in the setting of acute pain from her recent fracture, poor sleep, UTI  Recommendations: # Multifocal strokes  - Stroke labs TSH, HgbA1c, fasting lipid panel - CTA head and neck - Frequent neuro checks - Echocardiogram, if TTE is negative and no arrhythmias on monitoring, will need to consider risk/benefit of TEE - Prophylactic therapy-Antiplatelet med: Aspirin - dose '325mg'$  PO or '300mg'$  PR, followed by 81 mg daily - Plavix 300 mg load with 75 mg daily, course TBD  - Risk factor modification - Telemetry monitoring; 30 day event monitor on discharge if no arrythmias captured  - Blood pressure goal   - Permissive hypertension to 220/120 tonight - PT consult, OT consult, Speech consult, unless patient is back to baseline - Appreciate management of UTI and other comorbidities per primary team - Stroke team to follow  # Delirium/confusion -TSH -B12 -Thiamine -Ammonia -Delirium precaution -Pain control and infectious workup per primary team  Lesleigh Noe MD-PhD [facility] [phone] Available 7 PM to 7 AM, outside of these hours please call Neurologist on call as listed on Amion.

## 2022-03-02 NOTE — ED Notes (Signed)
Pt assisted to bathroom

## 2022-03-02 NOTE — Progress Notes (Signed)
Pharmacy Antibiotic Note  Mackenzie Madden is a 88 y.o. female admitted on 03/01/2022 with UTI.  Pharmacy has been consulted for Zosyn dosing. WBC WNL. Renal function age appropriate.   Plan: Zosyn 3.375G IV q8h to be infused over 4 hours Trend WBC, temp, renal function  F/U infectious work-up  Temp (24hrs), Avg:98.9 F (37.2 C), Min:98.9 F (37.2 C), Max:98.9 F (37.2 C)  Recent Labs  Lab 03/01/22 1351 03/01/22 1422  WBC 9.2  --   CREATININE 0.77 0.70    Estimated Creatinine Clearance: 32.2 mL/min (by C-G formula based on SCr of 0.7 mg/dL).    No Known Allergies  Narda Bonds, PharmD, BCPS Clinical Pharmacist Phone: [phone]

## 2022-03-02 NOTE — ED Notes (Signed)
;  RN will get lab work

## 2022-03-02 NOTE — Care Management Obs Status (Signed)
[place] NOTIFICATION   Patient Details  Name: Mackenzie Madden MRN: [id number] Date of Birth: 1928-06-14   Medicare Observation Status Notification Given:  Yes    Fuller Mandril, RN 03/02/2022, 2:54 PM

## 2022-03-02 NOTE — Progress Notes (Signed)
VASCULAR LAB    Bilateral lower extremity venous duplex has been performed.  See CV proc for preliminary results.   Margarett Viti, RVT 03/02/2022, 9:41 AM

## 2022-03-02 NOTE — Evaluation (Signed)
Physical Therapy Evaluation Patient Details Name: Mackenzie Madden MRN: [id number] DOB: 09-21-1928 Today's Date: 03/02/2022  History of Present Illness  88 y.o. F admitted on 5/31 due to L facial droop, confusion, and difficulty talking. 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 PMH significant for hypertension, glaucoma, pubic fracture (3 years ago) SIADH, recurrent UTIs, L wrist fx on 01/23/22 (NWB at this time).   Clinical Impression  Pt presents to PT requiring assist for mobility which has been the case since she broke her LUE at the end of April. Appears pt is not far from her recent baseline and can return home with assist of family and resumption of PT at home. Daughter present throughout eval.       Recommendations for follow up therapy are one component of a multi-disciplinary discharge planning process, led by the attending physician.  Recommendations may be updated based on patient status, additional functional criteria and insurance authorization.  Follow Up Recommendations Home health PT    Assistance Recommended at Discharge Frequent or constant Supervision/Assistance  Patient can return home with the following  A little help with walking and/or transfers;Help with stairs or ramp for entrance    Equipment Recommendations None recommended by PT  Recommendations for Other Services       Functional Status Assessment Patient has had a recent decline in their functional status and demonstrates the ability to make significant improvements in function in a reasonable and predictable amount of time.     Precautions / Restrictions Precautions Precautions: Fall  Precaution Comments: Baseline diplopia, baseline incontinence  Required Braces or Orthoses:Splint/Cast   Splint/Cast: lt wrist  Restrictions Weight Bearing Restrictions: Yes  LUE Weight Bearing: Non weight bearing  Other Position/Activity  Restrictions: NWB hand and elbow      Mobility  Bed Mobility Overal bed mobility: Needs Assistance  Bed Mobility: Supine to Sit, Sit to Supine     Supine to sit: Min assist, HOB elevated Sit to supine: Min assist   General bed mobility comments: Assist to elevate trunk into sitting and bring hips to EOB. Assist to guide trunk and bring legs back up onto stretcher returning to supine.    Transfers Overall transfer level: Needs assistance  Equipment used: Hemi-walker  Transfers: Sit to/from Stand  Sit to Stand: Min assist           General transfer comment: Assist for balance     Ambulation/Gait Ambulation/Gait assistance: Min assist Gait Distance (Feet): 20 Feet Assistive device: Hemi-walker Gait Pattern/deviations: Step-to pattern, Decreased stride length Gait velocity: decreased Gait velocity interpretation: <1.31 ft/sec, indicative of household ambulator   General Gait Details: Assist for balance and support and to position hemiwalker. Pt without her glasses and daughter felt that is also impacting mobility  Financial [employer] (Stroke Patients Only)       Balance Overall balance assessment: Needs assistance, History of Falls Sitting-balance support: Feet unsupported, No upper extremity supported       Standing balance support: During functional activity, Single extremity supported   Standing balance comment: UE support and min assist for static standing                             Pertinent Vitals/Pain Pain Assessment Pain Assessment: Faces  Faces Pain Scale: Hurts little more Pain  Location: LUE Pain Descriptors / Indicators: Grimacing Pain Intervention(s): Limited activity within patient's tolerance, Repositioned    Home Living Family/patient expects to be discharged to:: Private residence  Living Arrangements: Spouse/significant other  Available Help at Discharge: Family;Available 24 hours/day   Type of Home: House  Home Access: Ramped entrance       Home Layout: One level (Simultaneous filing. User may not have seen previous data.) Home Equipment: Rollator (4 wheels);Shower seat;BSC/3in1;Other (comment) (Hemiwalker  Simultaneous filing. User may not have seen previous data.)      Prior Function Prior Level of Function : Needs assist (Simultaneous filing. User may not have seen previous data.)       Physical Assist : Mobility (physical) Mobility (physical): Transfers;Gait;Bed mobility   Mobility Comments: Assist and use of hemiwalker (Simultaneous filing. User may not have seen previous data.) ADLs Comments: Requires assist for all ADL's since L wrist fx     Hand Dominance   Dominant Hand: Right (Simultaneous filing. User may not have seen previous data.)    Extremity/Trunk Assessment   Upper Extremity Assessment Upper Extremity Assessment: Defer to OT evaluation (Simultaneous filing. User may not have seen previous data.) LUE Deficits / Details: SPlinted with past wrist/ulnar fx LUE Sensation: decreased light touch LUE Coordination: decreased fine motor;decreased gross motor    Lower Extremity Assessment Lower Extremity Assessment: Generalized weakness (Simultaneous filing. User may not have seen previous data.)    Cervical / Trunk Assessment Cervical / Trunk Assessment: Kyphotic  Communication   Communication: HOH (Simultaneous filing. User may not have seen previous data.)  Cognition Arousal/Alertness: Awake/alert (Simultaneous filing. User may not have seen previous data.) Behavior During Therapy: [facility] for tasks assessed/performed (Simultaneous filing. User may not have seen previous data.) Overall Cognitive Status: History of cognitive impairments - at baseline (Simultaneous filing. User may not have seen previous data.)                                 General Comments: Pt HOH and husband took hearing aids home so slow to follow commands         General Comments General comments (skin integrity, edema, etc.): VSS on RA    Exercises     Assessment/Plan    PT Assessment All further PT needs can be met in the next venue of care  PT Problem List Decreased strength;Decreased activity tolerance;Decreased balance;Decreased mobility;Decreased knowledge of use of DME       PT Treatment Interventions      PT Goals (Current goals can be found in the Care Plan section)  Acute Rehab PT Goals Patient Stated Goal: not stated PT Goal Formulation: All assessment and education complete, DC therapy    Frequency       Co-evaluation               AM-PAC PT "6 Clicks" Mobility  Outcome Measure Help needed turning from your back to your side while in a flat bed without using bedrails?: A Little Help needed moving from lying on your back to sitting on the side of a flat bed without using bedrails?: A Little Help needed moving to and from a bed to a chair (including a wheelchair)?: A Little Help needed standing up from a chair using your arms (e.g., wheelchair or bedside chair)?: A Little Help needed to walk in hospital room?: A Little Help needed climbing 3-5 steps with a railing? : A Lot 6  Click Score: 17    End of Session Equipment Utilized During Treatment: Gait belt Activity Tolerance: Patient tolerated treatment well Patient left: in bed;with call bell/phone within reach;with family/visitor present   PT Visit Diagnosis: Other abnormalities of gait and mobility (R26.89);Muscle weakness (generalized) (M62.81);History of falling (Z91.81)    Time: 1431-1453 PT Time Calculation (min) (ACUTE ONLY): 22 min   Charges:   PT Evaluation $PT Eval Moderate Complexity: [place] [facility] [place] 03/02/2022, 3:19 PM

## 2022-03-02 NOTE — Discharge Summary (Addendum)
Physician Discharge Summary   Patient: Mackenzie Madden MRN: [id number] DOB: Feb 02, 1928  Admit date:     03/01/2022  Discharge date: 03/02/22  Discharge Physician: Nita Sells   PCP: Lavone Orn, MD   Recommendations at discharge:   Needs labs in 1 week Recommend home PT OT and close follow-up with neurology in the outpatient setting Recommend titration of blood pressure meds-started on Crestor this admission  Discharge Diagnoses: Principal Problem:   Acute stroke due to ischemia [facility]) Active Problems:   Acute cystitis without hematuria   Acute metabolic encephalopathy   SIADH (syndrome of inappropriate ADH production) ([place])   Chronic diastolic heart failure ([place])   Essential hypertension   Glaucoma   Hypothyroidism  Resolved Problems:   * No resolved hospital problems. [facility] Course:   88 year old white female-at baseline independent with dressing toileting HFpEF based on echo 2016 grade 1 DD-chronic LBBB Myoview 2017-symptoms were thought to be secondary to GERD and reflux-at that admission she was found to have hyperintense spleen?  Splenic mass-unclear if this was further characterized HTN Hypothyroid Prior pubic ramus fracture after fall in 2019 with concerns at the time for pelvic hematoma and bladder injury which was unfounded Acute right subcapital femoral fracture with impaction in 2020 repaired by Dr. Ninfa Linden Prior underlying SIADH Recent hospitalization 4/24-01/31/2022--vertigo causing left distal open radial fracture status post Rx--ORIF Dr. Greta Doom 01/25/2022-transitioned to CIR--that admission E. coli Rx cephalosporins for E. coli cystitis also had asymptomatic transaminitis at the time   She went to inpatient rehab recovered well and was discharged    Patient represented to [facility] ED 5/31 left-sided weakness generalized confusion left-sided droop left upper extremity weakness with headache-MRI = small subacute to acute  infarcts Neurologist consulted Found to have again concerns for urinary tract infection started on Zosyn   BUNs/creatinine 6/0.7 Magnesium 1.6 albumin 2.6 WBC eight 8.8 vitamin B12 488 A1c 4.7 TSH 6.25 CTA neck = negative for large occlusion atheromatous changes carotidsiphons-diffuse tortuosity dolichoectasia of neck suggesting chronic underlying HTN multifocal irregularities 2/2 HTN  Assessment and Plan:  Patient was admitted and MRI brain ultimately showed scattered right frontal and right parietal infarcts 2/2 atherosclerotic versus embolic CVA W0J was 4.7 LDL was 88 she should not be on hypoglycemic agents She was started on Plavix aspirin and Plavix will be discontinued in 20 days I increased her amlodipine to start however on 6/3 because we need permissive hypertension I did not think she had a urinary tract infection she was in fact just treated for this and I think she has asymptomatic bacteria she has no cystitis symptoms no pain in her belly and no white count so I discontinued the Zosyn She does have a left rib fracture but was getting PT OT and has family members close by daughter stays within 6 hours a day so she has good support at home we will reinitiate PT OT for the new onset stroke-the rest of her stroke work-up was unremarkable I discussed the case with neurology and we felt that patient was stabilized to discharge home and follow-up-Will need 6-week appointment with neurology       Consultants: Neurology Procedures performed:   Disposition: Home health Diet recommendation:  Cardiac and Carb modified diet DISCHARGE MEDICATION: Allergies as of 03/02/2022   No Known Allergies      Medication List     STOP taking these medications    docusate sodium 100 MG capsule Commonly known as: COLACE   polyethylene glycol 17  g packet Commonly known as: MIRALAX / GLYCOLAX       TAKE these medications    acetaminophen 325 MG tablet Commonly known as: TYLENOL Take  2 tablets (650 mg total) by mouth every 6 (six) hours as needed for mild pain (or Fever >/= 101).   amLODipine 5 MG tablet Commonly known as: NORVASC Take 1 tablet (5 mg total) by mouth daily. Start taking on: March 04, 2022 What changed:  medication strength how much to take These instructions start on March 04, 2022. If you are unsure what to do until then, ask your doctor or other care provider.   aspirin EC 81 MG tablet Take 1 tablet (81 mg total) by mouth daily. Swallow whole. Start taking on: March 03, 2022   Calcium 250 MG Caps Take 250 mg by mouth daily.   clopidogrel 75 MG tablet Commonly known as: PLAVIX Take 1 tablet (75 mg total) by mouth daily. Start taking on: March 03, 2022   dorzolamide-timolol 22.3-6.8 MG/ML ophthalmic solution Commonly known as: COSOPT Place 1 drop into both eyes 2 (two) times daily.   latanoprost 0.005 % ophthalmic solution Commonly known as: XALATAN Place 1 drop into both eyes at bedtime.   levothyroxine 88 MCG tablet Commonly known as: SYNTHROID Take 1 tablet (88 mcg total) by mouth daily before breakfast.   meclizine 12.5 MG tablet Commonly known as: ANTIVERT Take 1 tablet (12.5 mg total) by mouth daily.   multivitamin with minerals Tabs tablet Take 1 tablet by mouth daily.   rosuvastatin 10 MG tablet Commonly known as: CRESTOR Take 1 tablet (10 mg total) by mouth daily. Start taking on: March 03, 2022        Discharge Exam: There were no vitals filed for this visit. Awake coherent slight drooping of the left side of mouth No icterus no pallor chest clear no rales rhonchi S1-S2 no murmur Abdomen soft scaphoid No real restriction of movement Sensory grossly intact power grossly 5/5  Condition at discharge: fair  The results of significant diagnostics from this hospitalization (including imaging, microbiology, ancillary and laboratory) are listed below for reference.   Imaging Studies: CT ANGIO HEAD NECK W WO CM  Result  Date: 03/02/2022 CLINICAL DATA:  Initial evaluation for acute stroke. EXAM: CT ANGIOGRAPHY HEAD AND NECK TECHNIQUE: Multidetector CT imaging of the head and neck was performed using the standard protocol during bolus administration of intravenous contrast. Multiplanar CT image reconstructions and MIPs were obtained to evaluate the vascular anatomy. Carotid stenosis measurements (when applicable) are obtained utilizing NASCET criteria, using the distal internal carotid diameter as the denominator. RADIATION DOSE REDUCTION: This exam was performed according to the departmental dose-optimization program which includes automated exposure control, adjustment of the mA and/or kV according to patient size and/or use of iterative reconstruction technique. CONTRAST:  59m OMNIPAQUE IOHEXOL 350 MG/ML SOLN COMPARISON:  Prior CT and MRI from 03/01/2022. FINDINGS: CTA NECK FINDINGS Aortic arch: Visualized aortic arch normal caliber with normal branch pattern. Moderate atheromatous change about the arch itself. No significant stenosis about the origin of the great vessels. Origin of the brachycephalic artery is not visualized on this exam. Right carotid system: Right common and internal carotid arteries are diffusely tortuous and somewhat dolichoectatic. Eccentric calcified plaque at the right carotid bulb without significant stenosis. Multifocal irregularity about the cervical right ICA could be related to underlying hypertension or possibly FMD. Left carotid system: Left common and internal carotid arteries are tortuous and somewhat dolichoectatic. Atheromatous change about the  left carotid bulb and distal cervical left ICA without significant stenosis. Multifocal irregularity about the cervical left ICA could be related to hypertension and/or FMD. Vertebral arteries: Both vertebral arteries arise from the subclavian arteries. No visible proximal subclavian artery stenosis. Vertebral arteries tortuous but patent without stenosis  or dissection. Skeleton: No discrete or worrisome osseous lesions. Mild for age cervical spondylosis. Other neck: No other acute soft tissue abnormality within the neck. Upper chest: Visualized upper chest demonstrates no acute finding. Review of the MIP images confirms the above findings CTA HEAD FINDINGS Anterior circulation: Petrous segments patent bilaterally. Atheromatous change within the carotid siphons with associated mild to moderate narrowing on the right. No significant narrowing about the left siphon. A1 segments patent. Normal anterior communicating complex. Anterior cerebral arteries patent without stenosis. No M1 stenosis or occlusion. No proximal MCA branch occlusion. Distal MCA branches well perfused. Posterior circulation: Both vertebral arteries patent without significant stenosis. Left vertebral artery slightly dominant. Right PICA patent. Left PICA not well seen. Basilar somewhat diminutive and irregular but patent without significant stenosis. Superior cerebellar arteries patent bilaterally. Predominant fetal type origin of both PCAs. PCAs patent to their distal aspects without significant stenosis. Venous sinuses: Grossly patent allowing for timing the contrast bolus. Anatomic variants: As above.  No aneurysm. Review of the MIP images confirms the above findings IMPRESSION: 1. Negative CTA for large vessel occlusion or other emergent finding. 2. Atheromatous change about the carotid siphons with associated mild to moderate narrowing on the right. No other hemodynamically significant or correctable stenosis about the major arterial vasculature of the head and neck. 3. Diffuse tortuosity and dolichoectasia of the major arterial vasculature of the neck, suggesting chronic underlying hypertension. Multifocal irregularity of both cervical ICAs favored to be hypertensive in nature, although underlying FMD could also have this appearance. Electronically Signed   By: Jeannine Boga M.D.   On:  03/02/2022 02:46   CT HEAD WO CONTRAST (5MM)  Result Date: 03/01/2022 CLINICAL DATA:  Mental status change, unknown cause; left sided facial droop, confusion, Last seen well yesterday 10 pm EXAM: CT HEAD WITHOUT CONTRAST TECHNIQUE: Contiguous axial images were obtained from the base of the skull through the vertex without intravenous contrast. RADIATION DOSE REDUCTION: This exam was performed according to the departmental dose-optimization program which includes automated exposure control, adjustment of the mA and/or kV according to patient size and/or use of iterative reconstruction technique. COMPARISON:  01/23/2022 FINDINGS: Brain: There is no acute intracranial hemorrhage, mass effect, or edema. Gray-white differentiation is preserved. There is no extra-axial fluid collection. Prominence of the ventricles and sulci reflects stable parenchymal volume loss. Patchy and confluent areas of hypoattenuation in the supratentorial white matter are nonspecific but probably reflects stable chronic microvascular ischemic changes. There is superimposed chronic small vessel infarcts of the central white matter and deep gray nuclei. Vascular: No hyperdense vessel. There is atherosclerotic calcification at the skull base. Skull: Calvarium is unremarkable. Sinuses/Orbits: No acute finding. Other: None. IMPRESSION: No acute intracranial abnormality. Chronic microvascular ischemic changes with chronic small vessel infarcts. Difficult to evaluate for a new superimposed small infarct. Electronically Signed   By: Macy Mis M.D.   On: 03/01/2022 14:32   MR BRAIN WO CONTRAST  Result Date: 03/01/2022 CLINICAL DATA:  Stroke.  Left facial droop EXAM: MRI HEAD WITHOUT CONTRAST TECHNIQUE: Multiplanar, multiecho pulse sequences of the brain and surrounding structures were obtained without intravenous contrast. COMPARISON:  CT head 03/01/2022.  MRI head 01/24/2022 FINDINGS: Brain: Small areas of restricted  diffusion compatible  with acute or subacute infarct. Small areas of increased signal on diffusion-weighted imaging in the right frontal white matter and right parietal white matter. Small area increased signal in the right insula. Mild hyperintensity on diffusion weighted imaging in the left frontal white matter not present on the prior MRI. These are most likely due to acute or subacute infarctions. Generalized atrophy. Moderate chronic microvascular ischemic change in the white matter and pons. Multiple areas of chronic hemorrhage in the cerebellum and cerebral hemispheres bilaterally. No mass lesion. Vascular: Normal arterial flow voids Skull and upper cervical spine: No focal skeletal lesion. Sinuses/Orbits: Paranasal sinuses clear. Bilateral cataract extraction Other: None IMPRESSION: Multiple small areas of increased signal on diffusion-weighted imaging consistent with acute or subacute infarct. These were not present on the MRI of 01/24/2022 Atrophy and moderate chronic microvascular ischemic change. Multiple areas of chronic microhemorrhage in the brain. Correlate with hypertension history. Electronically Signed   By: Franchot Gallo M.D.   On: 03/01/2022 20:25   ECHOCARDIOGRAM COMPLETE BUBBLE STUDY  Result Date: 03/02/2022    ECHOCARDIOGRAM REPORT   Patient Name:   Mackenzie Madden [facility] Date of Exam: 03/02/2022 Medical Rec #:  [id number]            Height:       60.0 in Accession #:    [id number]           Weight:       103.4 lb Date of Birth:  [birth year]            BSA:          1.410 m Patient Age:    70 years             BP:           148/67 mmHg Patient Gender: F                    HR:           71 bpm. Exam Location:  Inpatient Procedure: 2D Echo, Cardiac Doppler, Color Doppler and Saline Contrast Bubble            Study Indications:    Stroke  History:        Patient has prior history of Echocardiogram examinations, most                 recent 02/22/2015. Risk Factors:Hypertension.  Sonographer:    Jefferey Pica Referring  Phys: [id number] [place]  1. Left ventricular ejection fraction, by estimation, is 40 to 45%. The left ventricle has mildly decreased function. The left ventricle demonstrates global hypokinesis. There is mild concentric left ventricular hypertrophy. Left ventricular diastolic parameters are indeterminate.  2. Right ventricular systolic function is normal. The right ventricular size is normal. There is normal pulmonary artery systolic pressure.  3. The mitral valve is normal in structure. Mild to moderate mitral valve regurgitation. No evidence of mitral stenosis.  4. Tricuspid valve regurgitation is moderate.  5. The aortic valve is normal in structure. Aortic valve regurgitation is mild. Aortic valve sclerosis/calcification is present, without any evidence of aortic stenosis. Aortic regurgitation PHT measures 691 msec.  6. The inferior vena cava is normal in size with greater than 50% respiratory variability, suggesting right atrial pressure of 3 mmHg.  7. Agitated saline contrast bubble study was negative, with no evidence of any interatrial shunt. FINDINGS  Left Ventricle: Left ventricular ejection fraction, by estimation, is 40 to 45%. The  left ventricle has mildly decreased function. The left ventricle demonstrates global hypokinesis. The left ventricular internal cavity size was normal in size. There is  mild concentric left ventricular hypertrophy. Left ventricular diastolic parameters are indeterminate. Right Ventricle: The right ventricular size is normal. No increase in right ventricular wall thickness. Right ventricular systolic function is normal. There is normal pulmonary artery systolic pressure. The tricuspid regurgitant velocity is 2.50 m/s, and  with an assumed right atrial pressure of 5 mmHg, the estimated right ventricular systolic pressure is 77.9 mmHg. Left Atrium: Left atrial size was normal in size. Right Atrium: Right atrial size was normal in size. Pericardium: There is  no evidence of pericardial effusion. Mitral Valve: The mitral valve is normal in structure. Mild mitral annular calcification. Mild to moderate mitral valve regurgitation. No evidence of mitral valve stenosis. Tricuspid Valve: The tricuspid valve is normal in structure. Tricuspid valve regurgitation is moderate . No evidence of tricuspid stenosis. Aortic Valve: The aortic valve is normal in structure. Aortic valve regurgitation is mild. Aortic regurgitation PHT measures 691 msec. Aortic valve sclerosis/calcification is present, without any evidence of aortic stenosis. Aortic valve peak gradient measures 11.4 mmHg. Pulmonic Valve: The pulmonic valve was normal in structure. Pulmonic valve regurgitation is not visualized. No evidence of pulmonic stenosis. Aorta: The aortic root is normal in size and structure. Venous: The inferior vena cava is normal in size with greater than 50% respiratory variability, suggesting right atrial pressure of 3 mmHg. IAS/Shunts: No atrial level shunt detected by color flow Doppler. Agitated saline contrast was given intravenously to evaluate for intracardiac shunting. Agitated saline contrast bubble study was negative, with no evidence of any interatrial shunt.  LEFT VENTRICLE PLAX 2D LVIDd:         4.30 cm LVIDs:         3.30 cm LV PW:         1.00 cm LV IVS:        1.30 cm LVOT diam:     1.70 cm LV SV:         49 LV SV Index:   35 LVOT Area:     2.27 cm  RIGHT VENTRICLE             IVC RV S prime:     10.10 cm/s  IVC diam: 1.40 cm LEFT ATRIUM             Index        RIGHT ATRIUM          Index LA diam:        2.80 cm 1.99 cm/m   RA Area:     9.48 cm LA Vol (A2C):   39.8 ml 28.22 ml/m  RA Volume:   19.40 ml 13.76 ml/m LA Vol (A4C):   36.0 ml 25.53 ml/m LA Biplane Vol: 37.8 ml 26.80 ml/m  AORTIC VALVE                 PULMONIC VALVE AV Area (Vmax): 1.59 cm     PV Vmax:       0.79 m/s AV Vmax:        168.50 cm/s  PV Peak grad:  2.5 mmHg AV Peak Grad:   11.4 mmHg LVOT Vmax:       118.00 cm/s LVOT Vmean:     74.300 cm/s LVOT VTI:       0.216 m AI PHT:         691 msec  AORTA Ao  Root diam: 3.00 cm Ao Asc diam:  3.40 cm MITRAL VALVE                TRICUSPID VALVE MV Area (PHT): 4.68 cm     TR Peak grad:   25.0 mmHg MV Decel Time: 162 msec     TR Vmax:        250.00 cm/s MV E velocity: 84.80 cm/s MV A velocity: 119.00 cm/s  SHUNTS MV E/A ratio:  0.71         Systemic VTI:  0.22 m                             Systemic Diam: 1.70 cm Kardie Tobb DO Electronically signed by Berniece Salines DO Signature Date/Time: 03/02/2022/11:59:30 AM    Final    VAS [place] LOWER EXTREMITY VENOUS (DVT)  Result Date: 03/02/2022  Lower Venous DVT Study Patient Name:  Mackenzie Madden [facility]  Date of Exam:   03/02/2022 Medical Rec #: [id number]             Accession #:    [id number] Date of Birth: 1927-10-31             Patient Gender: F Patient Age:   89 years Exam Location:  [facility] Procedure:      VAS [place] LOWER EXTREMITY VENOUS (DVT) Referring Phys: Cornelius Moras XU --------------------------------------------------------------------------------  Indications: Stroke.  Comparison Study: No prior study on file Performing Technologist: Sharion Dove RVS  Examination Guidelines: A complete evaluation includes B-mode imaging, spectral Doppler, color Doppler, and power Doppler as needed of all accessible portions of each vessel. Bilateral testing is considered an integral part of a complete examination. Limited examinations for reoccurring indications may be performed as noted. The reflux portion of the exam is performed with the patient in reverse Trendelenburg.  +---------+---------------+---------+-----------+----------+--------------+ RIGHT    CompressibilityPhasicitySpontaneityPropertiesThrombus Aging +---------+---------------+---------+-----------+----------+--------------+ CFV      Full           Yes      Yes                                  +---------+---------------+---------+-----------+----------+--------------+ SFJ      Full                                                        +---------+---------------+---------+-----------+----------+--------------+ FV Prox  Full                                                        +---------+---------------+---------+-----------+----------+--------------+ FV Mid   Full                                                        +---------+---------------+---------+-----------+----------+--------------+ FV DistalFull                                                        +---------+---------------+---------+-----------+----------+--------------+  PFV      Full                                                        +---------+---------------+---------+-----------+----------+--------------+ POP      Full           Yes      No                                  +---------+---------------+---------+-----------+----------+--------------+ PTV      Full                                                        +---------+---------------+---------+-----------+----------+--------------+ PERO     Full                                                        +---------+---------------+---------+-----------+----------+--------------+   +---------+---------------+---------+-----------+----------+--------------+ LEFT     CompressibilityPhasicitySpontaneityPropertiesThrombus Aging +---------+---------------+---------+-----------+----------+--------------+ CFV      Full           Yes      Yes                                 +---------+---------------+---------+-----------+----------+--------------+ SFJ      Full                                                        +---------+---------------+---------+-----------+----------+--------------+ FV Prox  Full                                                         +---------+---------------+---------+-----------+----------+--------------+ FV Mid   Full                                                        +---------+---------------+---------+-----------+----------+--------------+ FV DistalFull                                                        +---------+---------------+---------+-----------+----------+--------------+ PFV      Full                                                        +---------+---------------+---------+-----------+----------+--------------+  POP      Full           Yes      Yes                                 +---------+---------------+---------+-----------+----------+--------------+ PTV      Full                                                        +---------+---------------+---------+-----------+----------+--------------+ PERO     Full                                                        +---------+---------------+---------+-----------+----------+--------------+    Summary: BILATERAL: - No evidence of deep vein thrombosis seen in the lower extremities, bilaterally. -No evidence of popliteal cyst, bilaterally.   *See table(s) above for measurements and observations.    Preliminary     Microbiology: Results for orders placed or performed during the hospital encounter of 01/31/22  Urine Culture     Status: Abnormal   Collection Time: 02/02/22 11:15 AM   Specimen: Urine, Clean Catch  Result Value Ref Range Status   Specimen Description URINE, CLEAN CATCH  Final   Special Requests   Final    NONE Performed at [facility], [place] [address] [place], [place] [address]    Culture >=100,000 COLONIES/mL [facility] MORGANII (A)  Final   Report Status 02/07/2022 FINAL  Final   Organism ID, Bacteria MORGANELLA MORGANII (A)  Final      Susceptibility   Morganella morganii - MIC*    AMPICILLIN >=32 RESISTANT Resistant     CEFAZOLIN >=64 RESISTANT Resistant     CIPROFLOXACIN <=0.25 SENSITIVE  Sensitive     GENTAMICIN <=1 SENSITIVE Sensitive     IMIPENEM 4 SENSITIVE Sensitive     NITROFURANTOIN 128 RESISTANT Resistant     TRIMETH/SULFA <=20 SENSITIVE Sensitive     AMPICILLIN/SULBACTAM >=32 RESISTANT Resistant     PIP/TAZO <=4 SENSITIVE Sensitive     * >=100,000 COLONIES/mL MORGANELLA MORGANII    Labs: CBC: Recent Labs  Lab 03/01/22 1351 03/01/22 1422 03/02/22 0433  WBC 9.2  --  8.8  NEUTROABS 5.5  --  5.0  HGB 13.0 13.6 11.6*  HCT 39.4 40.0 35.0*  MCV 98.5  --  97.0  PLT 144*  --  536   Basic Metabolic Panel: Recent Labs  Lab 03/01/22 1351 03/01/22 1422 03/02/22 0433  NA 133* 132* 135  K 4.5 4.6 3.7  CL 100 97* 103  CO2 26  --  25  GLUCOSE 98 94 100*  BUN 6* 6* 6*  CREATININE 0.77 0.70 0.79  CALCIUM 9.8  --  8.8*  MG  --   --  1.6*   Liver Function Tests: Recent Labs  Lab 03/02/22 0433  AST 25  ALT 10  ALKPHOS 66  BILITOT 1.0  PROT 5.7*  ALBUMIN 2.6*   CBG: Recent Labs  Lab 03/01/22 1358  GLUCAP 100*    Discharge time spent: greater than 30 minutes.  Signed: Nita Sells, MD [facility] 03/02/2022

## 2022-03-02 NOTE — ED Notes (Signed)
Echo in progress.

## 2022-03-02 NOTE — Care Management CC44 (Signed)
Condition Code 44 Documentation Completed  Patient Details  Name: ETTA GASSETT MRN: [id number] Date of Birth: Jan 24, 1928   Condition Code 44 given:  Yes Patient signature on Condition Code 44 notice:  Yes Documentation of 2 MD's agreement:  Yes Code 44 added to claim:  Yes    Fuller Mandril, RN 03/02/2022, 2:54 PM

## 2022-03-02 NOTE — ED Notes (Signed)
Pt had slight difficulty with Multivitamin, tablet cut in half, pt still had difficulty getting part down before it dissolved.

## 2022-03-02 NOTE — Progress Notes (Addendum)
STROKE TEAM PROGRESS NOTE   INTERVAL HISTORY Her daughter [name]) is at the bedside.  Patient is pleasantly confused, it seems to take her a while to wake up. She does still have a splint on her left wrist from a fracture at the end of April. Per daughter, ortho still has her non weight bearing on her left arm. She has been using a hemi walker with a 1 assist since her fall.   Vitals:   03/02/22 0430 03/02/22 0500 03/02/22 0515 03/02/22 0530  BP: 138/73 135/83 131/69 (!) 148/67  Pulse: 71 74 71 62  Resp: '15 16 19 16  '$ Temp:      TempSrc:      SpO2: 98% 97% 97% 96%   CBC:  Recent Labs  Lab 03/01/22 1351 03/01/22 1422 03/02/22 0433  WBC 9.2  --  8.8  NEUTROABS 5.5  --  5.0  HGB 13.0 13.6 11.6*  HCT 39.4 40.0 35.0*  MCV 98.5  --  97.0  PLT 144*  --  846   Basic Metabolic Panel:  Recent Labs  Lab 03/01/22 1351 03/01/22 1422 03/02/22 0433  NA 133* 132* 135  K 4.5 4.6 3.7  CL 100 97* 103  CO2 26  --  25  GLUCOSE 98 94 100*  BUN 6* 6* 6*  CREATININE 0.77 0.70 0.79  CALCIUM 9.8  --  8.8*  MG  --   --  1.6*   Lipid Panel:  Recent Labs  Lab 03/02/22 0433  CHOL 136  TRIG 59  HDL 36*  CHOLHDL 3.8  VLDL 12  LDLCALC 88   HgbA1c:  Recent Labs  Lab 03/02/22 0433  HGBA1C 4.7*   Urine Drug Screen: No results for input(s): LABOPIA, COCAINSCRNUR, LABBENZ, AMPHETMU, THCU, LABBARB in the last 168 hours.  Alcohol Level No results for input(s): ETH in the last 168 hours.  IMAGING past 24 hours CT ANGIO HEAD NECK W WO CM  Result Date: 03/02/2022 CLINICAL DATA:  Initial evaluation for acute stroke. EXAM: CT ANGIOGRAPHY HEAD AND NECK TECHNIQUE: Multidetector CT imaging of the head and neck was performed using the standard protocol during bolus administration of intravenous contrast. Multiplanar CT image reconstructions and MIPs were obtained to evaluate the vascular anatomy. Carotid stenosis measurements (when applicable) are obtained utilizing NASCET criteria, using the distal  internal carotid diameter as the denominator. RADIATION DOSE REDUCTION: This exam was performed according to the departmental dose-optimization program which includes automated exposure control, adjustment of the mA and/or kV according to patient size and/or use of iterative reconstruction technique. CONTRAST:  23m OMNIPAQUE IOHEXOL 350 MG/ML SOLN COMPARISON:  Prior CT and MRI from 03/01/2022. FINDINGS: CTA NECK FINDINGS Aortic arch: Visualized aortic arch normal caliber with normal branch pattern. Moderate atheromatous change about the arch itself. No significant stenosis about the origin of the great vessels. Origin of the brachycephalic artery is not visualized on this exam. Right carotid system: Right common and internal carotid arteries are diffusely tortuous and somewhat dolichoectatic. Eccentric calcified plaque at the right carotid bulb without significant stenosis. Multifocal irregularity about the cervical right ICA could be related to underlying hypertension or possibly FMD. Left carotid system: Left common and internal carotid arteries are tortuous and somewhat dolichoectatic. Atheromatous change about the left carotid bulb and distal cervical left ICA without significant stenosis. Multifocal irregularity about the cervical left ICA could be related to hypertension and/or FMD. Vertebral arteries: Both vertebral arteries arise from the subclavian arteries. No visible proximal subclavian artery stenosis. Vertebral  arteries tortuous but patent without stenosis or dissection. Skeleton: No discrete or worrisome osseous lesions. Mild for age cervical spondylosis. Other neck: No other acute soft tissue abnormality within the neck. Upper chest: Visualized upper chest demonstrates no acute finding. Review of the MIP images confirms the above findings CTA HEAD FINDINGS Anterior circulation: Petrous segments patent bilaterally. Atheromatous change within the carotid siphons with associated mild to moderate  narrowing on the right. No significant narrowing about the left siphon. A1 segments patent. Normal anterior communicating complex. Anterior cerebral arteries patent without stenosis. No M1 stenosis or occlusion. No proximal MCA branch occlusion. Distal MCA branches well perfused. Posterior circulation: Both vertebral arteries patent without significant stenosis. Left vertebral artery slightly dominant. Right PICA patent. Left PICA not well seen. Basilar somewhat diminutive and irregular but patent without significant stenosis. Superior cerebellar arteries patent bilaterally. Predominant fetal type origin of both PCAs. PCAs patent to their distal aspects without significant stenosis. Venous sinuses: Grossly patent allowing for timing the contrast bolus. Anatomic variants: As above.  No aneurysm. Review of the MIP images confirms the above findings IMPRESSION: 1. Negative CTA for large vessel occlusion or other emergent finding. 2. Atheromatous change about the carotid siphons with associated mild to moderate narrowing on the right. No other hemodynamically significant or correctable stenosis about the major arterial vasculature of the head and neck. 3. Diffuse tortuosity and dolichoectasia of the major arterial vasculature of the neck, suggesting chronic underlying hypertension. Multifocal irregularity of both cervical ICAs favored to be hypertensive in nature, although underlying FMD could also have this appearance. Electronically Signed   By: Jeannine Boga M.D.   On: 03/02/2022 02:46   CT HEAD WO CONTRAST (5MM)  Result Date: 03/01/2022 CLINICAL DATA:  Mental status change, unknown cause; left sided facial droop, confusion, Last seen well yesterday 10 pm EXAM: CT HEAD WITHOUT CONTRAST TECHNIQUE: Contiguous axial images were obtained from the base of the skull through the vertex without intravenous contrast. RADIATION DOSE REDUCTION: This exam was performed according to the departmental dose-optimization  program which includes automated exposure control, adjustment of the mA and/or kV according to patient size and/or use of iterative reconstruction technique. COMPARISON:  01/23/2022 FINDINGS: Brain: There is no acute intracranial hemorrhage, mass effect, or edema. Gray-white differentiation is preserved. There is no extra-axial fluid collection. Prominence of the ventricles and sulci reflects stable parenchymal volume loss. Patchy and confluent areas of hypoattenuation in the supratentorial white matter are nonspecific but probably reflects stable chronic microvascular ischemic changes. There is superimposed chronic small vessel infarcts of the central white matter and deep gray nuclei. Vascular: No hyperdense vessel. There is atherosclerotic calcification at the skull base. Skull: Calvarium is unremarkable. Sinuses/Orbits: No acute finding. Other: None. IMPRESSION: No acute intracranial abnormality. Chronic microvascular ischemic changes with chronic small vessel infarcts. Difficult to evaluate for a new superimposed small infarct. Electronically Signed   By: Macy Mis M.D.   On: 03/01/2022 14:32   MR BRAIN WO CONTRAST  Result Date: 03/01/2022 CLINICAL DATA:  Stroke.  Left facial droop EXAM: MRI HEAD WITHOUT CONTRAST TECHNIQUE: Multiplanar, multiecho pulse sequences of the brain and surrounding structures were obtained without intravenous contrast. COMPARISON:  CT head 03/01/2022.  MRI head 01/24/2022 FINDINGS: Brain: Small areas of restricted diffusion compatible with acute or subacute infarct. Small areas of increased signal on diffusion-weighted imaging in the right frontal white matter and right parietal white matter. Small area increased signal in the right insula. Mild hyperintensity on diffusion weighted imaging in the  left frontal white matter not present on the prior MRI. These are most likely due to acute or subacute infarctions. Generalized atrophy. Moderate chronic microvascular ischemic change  in the white matter and pons. Multiple areas of chronic hemorrhage in the cerebellum and cerebral hemispheres bilaterally. No mass lesion. Vascular: Normal arterial flow voids Skull and upper cervical spine: No focal skeletal lesion. Sinuses/Orbits: Paranasal sinuses clear. Bilateral cataract extraction Other: None IMPRESSION: Multiple small areas of increased signal on diffusion-weighted imaging consistent with acute or subacute infarct. These were not present on the MRI of 01/24/2022 Atrophy and moderate chronic microvascular ischemic change. Multiple areas of chronic microhemorrhage in the brain. Correlate with hypertension history. Electronically Signed   By: Franchot Gallo M.D.   On: 03/01/2022 20:25    PHYSICAL EXAM Constitutional: Appears frail, no acute distress Cardiovascular: Normal rate and regular rhythm. Respiratory: Effort normal, non-labored breathing  Neuro: Mental Status: Patient is awake, alert, oriented to person, place, disoriented to time/date and situation.States her daughter is her "mother" but knows she is incorrect. When asked why she is at the hospital she initially states it is because of her husband, but with prompting she remembers that she had a stroke. Some wording finding difficulties.  She is hard of hearing. However she is able to name and repeat.  No signs of neglect. Cranial Nerves: II: Visual Fields are full. Pupils are equal, round, and reactive to light.  III,IV, VI: EOMI without ptosis. She does have diplopia at baseline and wears corrective lenses.  V: Facial sensation is symmetric VII: Facial movement is notable for a subtle left facial droop VIII: hearing is extremely hard of hearing at baseline X: Uvula elevates symmetrically XI: Turns head in both directions freely XII: tongue is midline without atrophy or fasciculations.  Motor: Tone is normal. Bulk is normal.  She is able to maintain bilateral upper extremities antigravity for 10 seconds but does  not supinate either of them. Holds lower extremities bilaterally for 5 seconds each.   Sensory: Equal reaction to touch in all 4 extremities Plantars: Toes are downgoing bilaterally.  Cerebellar: FNF intact bilaterally Gait:  Deferred     ASSESSMENT/PLAN Ms. AALLIYAH KILKER is a 88 y.o. female with history of hypertension, glaucoma, pubic fracture (3 years ago) SIADH, recurrent UTIs, recent left wrist fracture and fall presenting with confusion, nonsensical speech, and 2 weeks of vertigo. Orthostatic vitals ordered. Consider loop recorder pending DVT study results.   Stroke:  Scattered right insular cortex, MCA and left frontal small infarcts likely secondary to unclear embolic source, concerning for occult afib Code Stroke CT head No acute abnormality. Small vessel disease. Atrophy. ASPECTS 10.    CTA head & neck- negative for LVO MRI- right frontal white matter and right parietal white matter. Small area increased signal in the right insula. Mild hyperintensity on diffusion weighted imaging in the left frontal white matter not present on the prior MRI. 2D Echo- results EF 40-45% Vas Lower Extremity Duplex no DVT Recommend 30 day cardiac event monitoring to rule out afib. If neg, may consider loop recorder.  LDL 88 HgbA1c 4.7 VTE prophylaxis - lovenox No antithrombotic prior to admission, now on aspirin 81 mg daily and clopidogrel 75 mg daily for 3 months and then ASA alone.  Therapy recommendations:  HH PT Disposition:  likely home  Cardiomyopathy 2D echo EF 40 to 45% We will follow-up outpatient with cardiology  Hypertension Home meds:  Amlodipine 2.'5mg'$  Stable Orthostatic vital no orthostatic hypotension Long-term BP  goal normotensive  Hyperlipidemia LDL 88, goal < 70 Add Crestor '10mg'$   Continue statin at discharge  Other Stroke Risk Factors Advanced Age >/= 11   Other Active Problems Urinary Tract Infection UA WBC > 50 Zosyn Management per primary team Left  wrist fracture in 12/2021 after fall S/p surgery repair Non weight bearing F/u with ortho  Hospital day # 1  Patient seen and examined by NP/APP with MD. MD to update note as needed.   Janine Ores, DNP, FNP-BC [facility] Pager: [phone]  ATTENDING NOTE: I reviewed above note and agree with the assessment and plan. Pt was seen and examined.   88 year old female with history of hypertension, SIADH, glaucoma admitted for left facial droop, dizziness, speech difficulty and imbalance gait.  She did have a fall last months with left arm fracture status post surgery, however so far still not healing well, nonweightbearing.  Daughter stated patient working with PT/OT was able to walk with hemiwalker but family was very cautious and not let her walk alone.  On dismission CT head negative.  MRI showed right insular cortex, MCA, left frontal scattered punctate small infarcts.  CT head and neck bilateral siphon atherosclerosis, with right ICA siphon mild to moderate stenosis.  EF 40 to 45%, DVT negative, LDL 88, A1c 4.7, creatinine 0.79.  UA WBC more than 50.  Orthostatic vitals no orthostatic hypotension.  Etiology for patient stroke concerning for embolic phenomena, wondering if she has occult A-fib.  Recommend 30-day CardioNet monitoring as outpatient to rule out A-fib.  If negative, may consider loop recorder.  Continue aspirin 81 and Plavix 75 DAPT for 3 weeks and then aspirin alone.  Continue Crestor 10.  Currently on Zosyn for UTI treatment, management per primary team.  PT/OT recommend home health.  Follow-up with cardiology as outpatient for cardiomyopathy.  For detailed assessment and plan, please refer to above as I have made changes wherever appropriate.   Neurology will sign off. Please call with questions. Pt will follow up with stroke clinic NP at [facility] in about 4 weeks. Thanks for the consult.   Rosalin Hawking, MD PhD Stroke Neurology 03/02/2022 3:52 PM   To contact  Stroke Continuity provider, please refer to [email or web address] After hours, contact General Neurology

## 2022-03-02 NOTE — ED Notes (Signed)
MD Bhgat to bedside to assess patient

## 2022-03-02 NOTE — Progress Notes (Signed)
  Notified of potential loop recorder need by Neurology Team.  Chart reviewed.   Echocardiogram shows EF 40-45%. Recommend referral to general cardiology.   Plan for patient to return home today with PT/OT via her current [facility] agency.   Given overall frailty and discharge from ED, would recommend cardiac event monitor be placed.   Discussed goal of cardiac monitoring with patient and family personally.   Would potentially be loop candidate if event monitor shows no atrial arrhythmias, as long as she is determined to be an appropriate candidate for [facility] consideration.   Will schedule follow up with an EP physician in 6-8 weeks to allow time for the cardiac monitor to be worn and result.   Legrand Como [address]" [place], PA-C  03/02/2022 4:06 PM

## 2022-03-02 NOTE — Evaluation (Signed)
Occupational Therapy Evaluation Patient Details Name: Mackenzie Madden MRN: [id number] DOB: 1928-01-24 Today's Date: 03/02/2022   History of Present Illness 88 y.o. F admitted on 5/31 due to L facial droop, confusion, and difficulty talking. 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 PMH significant for hypertension, glaucoma, pubic fracture (3 years ago) SIADH, recurrent UTIs, L wrist fx on 01/23/22 (NWB at this time). (Simultaneous filing. User may not have seen previous data.)   Clinical Impression   Pt admitted for concerns listed above. PTA pt and daughter reported that she has been requiring assist with ADL's since her Wrist fx in April. She uses a hemiwalker at home and has aides that assist with all IADL's and bathing every day for 5 hours a day. At this time, pt presents with decreased balance, requiring up to mod A for steadying and taking small steps. Additionally pt requires mod A +2 for most ADL's that require standing at this time. Family reports that they will continue to be able to manage her care at home. OT recommending [facility] services and will continue to follow acutely.       Recommendations for follow up therapy are one component of a multi-disciplinary discharge planning process, led by the attending physician.  Recommendations may be updated based on patient status, additional functional criteria and insurance authorization.   Follow Up Recommendations  Home health OT    Assistance Recommended at Discharge Frequent or constant Supervision/Assistance  Patient can return home with the following A lot of help with walking and/or transfers;Two people to help with bathing/dressing/bathroom;Assistance with cooking/housework;Direct supervision/assist for medications management;Direct supervision/assist for financial management;Assist for transportation;Help with stairs or ramp for entrance    Functional Status  Assessment  Patient has had a recent decline in their functional status and demonstrates the ability to make significant improvements in function in a reasonable and predictable amount of time.  Equipment Recommendations  None recommended by OT    Recommendations for Other Services       Precautions / Restrictions Precautions Precautions: Fall (Simultaneous filing. User may not have seen previous data.) Precaution Comments: Baseline diplopia, baseline incontinence (Simultaneous filing. User may not have seen previous data.) Required Braces or Orthoses: Splint/Cast (Simultaneous filing. User may not have seen previous data.) Splint/Cast: lt wrist (Simultaneous filing. User may not have seen previous data.) Restrictions Weight Bearing Restrictions: Yes (Simultaneous filing. User may not have seen previous data.) LUE Weight Bearing: Non weight bearing (Simultaneous filing. User may not have seen previous data.) Other Position/Activity Restrictions: NWB hand and elbow (Simultaneous filing. User may not have seen previous data.)      Mobility Bed Mobility     Rolling: Min assist Sidelying to sit: Mod assist            Transfers                          Balance     Sitting balance-Leahy Scale: Fair       Standing balance-Leahy Scale: Poor                             ADL either performed or assessed with clinical judgement   ADL Overall ADL's : Needs assistance/impaired Eating/Feeding: Set up;Sitting   Grooming: Minimal assistance;Sitting   Upper Body Bathing: Minimal assistance;Sitting   Lower Body Bathing: Moderate assistance;+2 for physical assistance;+2  for safety/equipment;Sitting/lateral leans;Sit to/from stand   Upper Body Dressing : Minimal assistance;Sitting   Lower Body Dressing: Moderate assistance;+2 for physical assistance;+2 for safety/equipment;Sit to/from stand;Sitting/lateral leans   Toilet Transfer: Moderate  assistance;Stand-pivot   Toileting- Clothing Manipulation and Hygiene: Moderate assistance;Sitting/lateral lean;Sit to/from stand       Functional mobility during ADLs: Moderate assistance;+2 for physical assistance;+2 for safety/equipment General ADL Comments: Pt requiring mod A +2 for all standing tasks, due to poor balance and reliant on support for ADL's in standing     Vision Baseline Vision/History: 1 Wears glasses Ability to See in Adequate Light: 1 Impaired Patient Visual Report: Diplopia Vision Assessment?: No apparent visual deficits Additional Comments: Pt has diplopia at baseline     Perception     Praxis      Pertinent Vitals/Pain       Hand Dominance Right (Simultaneous filing. User may not have seen previous data.)   Extremity/Trunk Assessment Upper Extremity Assessment Upper Extremity Assessment: Defer to OT evaluation (Simultaneous filing. User may not have seen previous data.) LUE Deficits / Details: SPlinted with past wrist/ulnar fx LUE Sensation: decreased light touch LUE Coordination: decreased fine motor;decreased gross motor   Lower Extremity Assessment Lower Extremity Assessment: Generalized weakness (Simultaneous filing. User may not have seen previous data.)   Cervical / Trunk Assessment Cervical / Trunk Assessment: Kyphotic   Communication Communication Communication: HOH (Simultaneous filing. User may not have seen previous data.)   Cognition                                             General Comments  VSS on RA    Exercises     Shoulder Instructions      Home Living Family/patient expects to be discharged to:: Private residence (Simultaneous filing. User may not have seen previous data.) Living Arrangements: Spouse/significant other (Simultaneous filing. User may not have seen previous data.) Available Help at Discharge: Family;Available 24 hours/day (Simultaneous filing. User may not have seen previous  data.) Type of Home: House (Simultaneous filing. User may not have seen previous data.) Home Access: Ramped entrance (Simultaneous filing. User may not have seen previous data.)     Home Layout: One level (Simultaneous filing. User may not have seen previous data.)     Bathroom Shower/Tub: Walk-in shower;Door (Simultaneous filing. User may not have seen previous data.)   Bathroom Toilet: Handicapped height (Simultaneous filing. User may not have seen previous data.) Bathroom Accessibility: Yes (Simultaneous filing. User may not have seen previous data.) How Accessible: Accessible via walker Home Equipment: Rollator (4 wheels);Shower seat;BSC/3in1;Other (comment) (Hemiwalker  Simultaneous filing. User may not have seen previous data.)          Prior Functioning/Environment Prior Level of Function : Needs assist (Simultaneous filing. User may not have seen previous data.)       Physical Assist : Mobility (physical) Mobility (physical): Transfers;Gait;Bed mobility   Mobility Comments: Assist and use of hemiwalker (Simultaneous filing. User may not have seen previous data.) ADLs Comments: Requires assist for all ADL's since L wrist fx        OT Problem List: Decreased strength;Decreased range of motion;Decreased activity tolerance;Decreased cognition;Decreased safety awareness;Decreased knowledge of use of DME or AE;Impaired UE functional use      OT Treatment/Interventions: Self-care/ADL training;Therapeutic exercise;Energy conservation;DME and/or AE instruction;Therapeutic activities;Cognitive remediation/compensation;Balance training;Patient/family education    OT Goals(Current goals can  be found in the care plan section) Acute Rehab OT Goals Patient Stated Goal: To go home OT Goal Formulation: With patient/family Time For Goal Achievement: 03/16/22 Potential to Achieve Goals: Good ADL Goals Pt Will Perform Lower Body Bathing: with supervision;sitting/lateral leans;sit  to/from stand Pt Will Perform Lower Body Dressing: with min assist;sitting/lateral leans;sit to/from stand Pt Will Transfer to Toilet: with min guard assist;ambulating Pt Will Perform Toileting - Clothing Manipulation and hygiene: with min guard assist;sit to/from stand;sitting/lateral leans Additional ADL Goal #1: Pt will complete all aspects of bed mobility with min guard for safety.  OT Frequency: Min 2X/week    Co-evaluation              AM-PAC OT "6 Clicks" Daily Activity     Outcome Measure Help from another person eating meals?: A Little Help from another person taking care of personal grooming?: A Little Help from another person toileting, which includes using toliet, bedpan, or urinal?: A Lot Help from another person bathing (including washing, rinsing, drying)?: A Lot Help from another person to put on and taking off regular upper body clothing?: A Little Help from another person to put on and taking off regular lower body clothing?: A Lot 6 Click Score: 15   End of Session Equipment Utilized During Treatment: Gait belt Nurse Communication: Mobility status  Activity Tolerance: Patient tolerated treatment well Patient left: in bed;with call bell/phone within reach;with family/visitor present  OT Visit Diagnosis: Unsteadiness on feet (R26.81);Other abnormalities of gait and mobility (R26.89);Muscle weakness (generalized) (M62.81)                Time: 4967-5916 OT Time Calculation (min): 35 min Charges:  OT General Charges $OT Visit: 1 Visit OT Evaluation $OT Eval Moderate Complexity: 1 Mod OT Treatments $Self Care/Home Management : 8-22 mins  Emanuell Morina H., OTR/L Acute Rehabilitation  Morelia Cassells Elane Marlon Suleiman 03/02/2022, 3:18 PM

## 2022-03-02 NOTE — ED Notes (Signed)
Neurology at bedside.

## 2022-03-02 NOTE — Progress Notes (Incomplete)
PROGRESS NOTE   Mackenzie Madden  ZOX:[id number] DOB: October 16, 1927 DOA: 03/01/2022 PCP: Lavone Orn, MD  Brief Narrative:    88 year old white female-at baseline independent with dressing toileting HFpEF based on echo 2016 grade 1 DD-chronic LBBB Myoview 2017-symptoms were thought to be secondary to GERD and reflux-at that admission she was found to have hyperintense spleen?  Splenic mass-unclear if this was further characterized HTN Hypothyroid Prior pubic ramus fracture after fall in 2019 with concerns at the time for pelvic hematoma and bladder injury which was unfounded Acute right subcapital femoral fracture with impaction in 2020 repaired by Dr. Ninfa Linden Prior underlying SIADH Recent hospitalization 4/24-01/31/2022--vertigo causing left distal open radial fracture status post Rx--[facility] Dr. Greta Doom 01/25/2022-transitioned to CIR--that admission E. coli Rx cephalosporins for E. coli cystitis also had asymptomatic transaminitis at the time  She went to inpatient rehab recovered well and was discharged   [facility] ED 5/31 left-sided weakness generalized confusion left-sided droop left upper extremity weakness with headache-MRI = small subacute to acute infarcts Neurologist consulted Found to have again concerns for urinary tract infection started on Zosyn  BUNs/creatinine 6/0.7 Magnesium 1.6 albumin 2.6 WBC eight 8.8 vitamin B12 488 A1c 4.7 TSH 6.25 CTA neck = negative for large occlusion atheromatous changes carotidsiphons-diffuse tortuosity dolichoectasia of neck suggesting chronic underlying HTN multifocal irregularities 2/2 HTN  Hospital-Problem based course    DVT prophylaxis:  Code Status:  Family Communication:  Disposition:  Status is: Inpatient {Inpatient:23812}   Consultants:  ***  Procedures: ***  Antimicrobials: ***    Subjective: ***  Objective: Vitals:   03/02/22 0430 03/02/22 0500 03/02/22 0515 03/02/22 0530  BP: 138/73  135/83 131/69 (!) 148/67  Pulse: 71 74 71 62  Resp: '15 16 19 16  '$ Temp:      TempSrc:      SpO2: 98% 97% 97% 96%    Intake/Output Summary (Last 24 hours) at 03/02/2022 0656 Last data filed at 03/02/2022 0438 Gross per 24 hour  Intake 1222.61 ml  Output 300 ml  Net 922.61 ml   There were no vitals filed for this visit.  Examination:    Data Reviewed: personally reviewed   CBC    Component Value Date/Time   WBC 8.8 03/02/2022 0433   RBC 3.61 (L) 03/02/2022 0433   HGB 11.6 (L) 03/02/2022 0433   HCT 35.0 (L) 03/02/2022 0433   PLT 153 03/02/2022 0433   MCV 97.0 03/02/2022 0433   MCH 32.1 03/02/2022 0433   MCHC 33.1 03/02/2022 0433   RDW 13.5 03/02/2022 0433   LYMPHSABS 2.4 03/02/2022 0433   MONOABS 1.1 (H) 03/02/2022 0433   EOSABS 0.2 03/02/2022 0433   BASOSABS 0.1 03/02/2022 0433      Latest Ref Rng & Units 03/02/2022    4:33 AM 03/01/2022    2:22 PM 03/01/2022    1:51 PM  CMP  Glucose 70 - 99 mg/dL 100   94   98    BUN 8 - 23 mg/dL '6   6   6    '$ Creatinine 0.44 - 1.00 mg/dL 0.79   0.70   0.77    Sodium 135 - 145 mmol/L 135   132   133    Potassium 3.5 - 5.1 mmol/L 3.7   4.6   4.5    Chloride 98 - 111 mmol/L 103   97   100    CO2 22 - 32 mmol/L 25    26    Calcium 8.9 -  10.3 mg/dL 8.8    9.8    Total Protein 6.5 - 8.1 g/dL 5.7      Total Bilirubin 0.3 - 1.2 mg/dL 1.0      Alkaline Phos 38 - 126 U/L 66      AST 15 - 41 U/L 25      ALT 0 - 44 U/L 10         Radiology Studies: CT ANGIO HEAD NECK W WO CM  Result Date: 03/02/2022 CLINICAL DATA:  Initial evaluation for acute stroke. EXAM: CT ANGIOGRAPHY HEAD AND NECK TECHNIQUE: Multidetector CT imaging of the head and neck was performed using the standard protocol during bolus administration of intravenous contrast. Multiplanar CT image reconstructions and MIPs were obtained to evaluate the vascular anatomy. Carotid stenosis measurements (when applicable) are obtained utilizing NASCET criteria, using the distal internal  carotid diameter as the denominator. RADIATION DOSE REDUCTION: This exam was performed according to the departmental dose-optimization program which includes automated exposure control, adjustment of the mA and/or kV according to patient size and/or use of iterative reconstruction technique. CONTRAST:  28m OMNIPAQUE IOHEXOL 350 MG/ML SOLN COMPARISON:  Prior CT and MRI from 03/01/2022. FINDINGS: CTA NECK FINDINGS Aortic arch: Visualized aortic arch normal caliber with normal branch pattern. Moderate atheromatous change about the arch itself. No significant stenosis about the origin of the great vessels. Origin of the brachycephalic artery is not visualized on this exam. Right carotid system: Right common and internal carotid arteries are diffusely tortuous and somewhat dolichoectatic. Eccentric calcified plaque at the right carotid bulb without significant stenosis. Multifocal irregularity about the cervical right ICA could be related to underlying hypertension or possibly FMD. Left carotid system: Left common and internal carotid arteries are tortuous and somewhat dolichoectatic. Atheromatous change about the left carotid bulb and distal cervical left ICA without significant stenosis. Multifocal irregularity about the cervical left ICA could be related to hypertension and/or FMD. Vertebral arteries: Both vertebral arteries arise from the subclavian arteries. No visible proximal subclavian artery stenosis. Vertebral arteries tortuous but patent without stenosis or dissection. Skeleton: No discrete or worrisome osseous lesions. Mild for age cervical spondylosis. Other neck: No other acute soft tissue abnormality within the neck. Upper chest: Visualized upper chest demonstrates no acute finding. Review of the MIP images confirms the above findings CTA HEAD FINDINGS Anterior circulation: Petrous segments patent bilaterally. Atheromatous change within the carotid siphons with associated mild to moderate narrowing on the  right. No significant narrowing about the left siphon. A1 segments patent. Normal anterior communicating complex. Anterior cerebral arteries patent without stenosis. No M1 stenosis or occlusion. No proximal MCA branch occlusion. Distal MCA branches well perfused. Posterior circulation: Both vertebral arteries patent without significant stenosis. Left vertebral artery slightly dominant. Right PICA patent. Left PICA not well seen. Basilar somewhat diminutive and irregular but patent without significant stenosis. Superior cerebellar arteries patent bilaterally. Predominant fetal type origin of both PCAs. PCAs patent to their distal aspects without significant stenosis. Venous sinuses: Grossly patent allowing for timing the contrast bolus. Anatomic variants: As above.  No aneurysm. Review of the MIP images confirms the above findings IMPRESSION: 1. Negative CTA for large vessel occlusion or other emergent finding. 2. Atheromatous change about the carotid siphons with associated mild to moderate narrowing on the right. No other hemodynamically significant or correctable stenosis about the major arterial vasculature of the head and neck. 3. Diffuse tortuosity and dolichoectasia of the major arterial vasculature of the neck, suggesting chronic underlying hypertension. Multifocal irregularity of  both cervical ICAs favored to be hypertensive in nature, although underlying FMD could also have this appearance. Electronically Signed   By: Jeannine Boga M.D.   On: 03/02/2022 02:46   CT HEAD WO CONTRAST (5MM)  Result Date: 03/01/2022 CLINICAL DATA:  Mental status change, unknown cause; left sided facial droop, confusion, Last seen well yesterday 10 pm EXAM: CT HEAD WITHOUT CONTRAST TECHNIQUE: Contiguous axial images were obtained from the base of the skull through the vertex without intravenous contrast. RADIATION DOSE REDUCTION: This exam was performed according to the departmental dose-optimization program which  includes automated exposure control, adjustment of the mA and/or kV according to patient size and/or use of iterative reconstruction technique. COMPARISON:  01/23/2022 FINDINGS: Brain: There is no acute intracranial hemorrhage, mass effect, or edema. Gray-white differentiation is preserved. There is no extra-axial fluid collection. Prominence of the ventricles and sulci reflects stable parenchymal volume loss. Patchy and confluent areas of hypoattenuation in the supratentorial white matter are nonspecific but probably reflects stable chronic microvascular ischemic changes. There is superimposed chronic small vessel infarcts of the central white matter and deep gray nuclei. Vascular: No hyperdense vessel. There is atherosclerotic calcification at the skull base. Skull: Calvarium is unremarkable. Sinuses/Orbits: No acute finding. Other: None. IMPRESSION: No acute intracranial abnormality. Chronic microvascular ischemic changes with chronic small vessel infarcts. Difficult to evaluate for a new superimposed small infarct. Electronically Signed   By: Macy Mis M.D.   On: 03/01/2022 14:32   MR BRAIN WO CONTRAST  Result Date: 03/01/2022 CLINICAL DATA:  Stroke.  Left facial droop EXAM: MRI HEAD WITHOUT CONTRAST TECHNIQUE: Multiplanar, multiecho pulse sequences of the brain and surrounding structures were obtained without intravenous contrast. COMPARISON:  CT head 03/01/2022.  MRI head 01/24/2022 FINDINGS: Brain: Small areas of restricted diffusion compatible with acute or subacute infarct. Small areas of increased signal on diffusion-weighted imaging in the right frontal white matter and right parietal white matter. Small area increased signal in the right insula. Mild hyperintensity on diffusion weighted imaging in the left frontal white matter not present on the prior MRI. These are most likely due to acute or subacute infarctions. Generalized atrophy. Moderate chronic microvascular ischemic change in the white  matter and pons. Multiple areas of chronic hemorrhage in the cerebellum and cerebral hemispheres bilaterally. No mass lesion. Vascular: Normal arterial flow voids Skull and upper cervical spine: No focal skeletal lesion. Sinuses/Orbits: Paranasal sinuses clear. Bilateral cataract extraction Other: None IMPRESSION: Multiple small areas of increased signal on diffusion-weighted imaging consistent with acute or subacute infarct. These were not present on the MRI of 01/24/2022 Atrophy and moderate chronic microvascular ischemic change. Multiple areas of chronic microhemorrhage in the brain. Correlate with hypertension history. Electronically Signed   By: Franchot Gallo M.D.   On: 03/01/2022 20:25     Scheduled Meds:   stroke: early stages of recovery book   Does not apply Once   aspirin EC  81 mg Oral Daily   [START ON 03/03/2022] clopidogrel  75 mg Oral Daily   dorzolamide-timolol  1 drop Both Eyes BID   enoxaparin (LOVENOX) injection  40 mg Subcutaneous Daily   latanoprost  1 drop Both Eyes QHS   levothyroxine  88 mcg Oral Q0600   multivitamin with minerals  1 tablet Oral Daily   Continuous Infusions:  piperacillin-tazobactam (ZOSYN)  IV 3.375 g (03/02/22 0525)     LOS: 1 day   Time spent: ***  Nita Sells, MD [facility] To contact the attending provider between  7A-7P or the covering provider during after hours 7P-7A, please log into the web site [email or web address] and access using universal [place] password for that web site. If you do not have the password, please call the hospital operator.  03/02/2022, 6:56 AM

## 2022-03-02 NOTE — Progress Notes (Addendum)
RNCM spoke with patient's daughter Leander Rams. regarding [facility], OT. Patient is currently being followed by [facility] home [facility] and family would like to continue [facility] services with [facility]. This RNCM notified Amy with [facility] regarding continue to follow patient at home.    5:03 pm- RNCM received confirmation from Amy with [facility] who will continue to follow patient for [facility] services.   No additional TOC needs at this time.

## 2022-03-04 LAB — URINE CULTURE: Culture: >=100000 — AB

## 2022-03-05 LAB — METHYLMALONIC ACID, SERUM: Methylmalonic Acid, Quantitative: 159 nmol/L (ref 0–378)

## 2022-03-05 NOTE — Progress Notes (Signed)
Staff message sent to Dr. Laurann Montana per MD Lorin Mercy request for f/u of urine culture that resulted post-discharge by MD Samtani.  Lorelei Pont, PharmD, BCPS 03/05/2022 11:36 AM ED Clinical Pharmacist -  [phone]

## 2022-03-20 NOTE — Progress Notes (Unsigned)
ELECTROPHYSIOLOGY CONSULT NOTE  Patient ID: Mackenzie Madden MRN: [id number], DOB/AGE: 02-Dec-1927   Date of Consult: 03/22/2022  Primary Physician: Lavone Orn, MD Primary Cardiologist: None  Primary Electrophysiologist: New to Dr. Quentin Ore Reason for Consultation: Cryptogenic stroke; recommendations regarding Implantable Loop Recorder Insurance: [facility] Medicare  History of Present Illness EP has been asked to evaluate Mackenzie Madden for placement of an implantable loop recorder to monitor for atrial fibrillation by Dr Erlinda Hong.  The patient was admitted on earlier this month with confusion, nonsensical speech, and dizziness.    Imaging demonstrated Scattered right insular cortex, MCA and left frontal small infarcts likely secondary to unclear embolic source, concerning for occult afib.    She has undergone workup for stroke:  Stroke:  Scattered right insular cortex, MCA and left frontal small infarcts likely secondary to unclear embolic source, concerning for occult afib Code Stroke CT head No acute abnormality. Small vessel disease. Atrophy. ASPECTS 10.    CTA head & neck- negative for LVO MRI- right frontal white matter and right parietal white matter. Small area increased signal in the right insula. Mild hyperintensity on diffusion weighted imaging in the left frontal white matter not present on the prior MRI. 2D Echo- results EF 40-45% Vas Lower Extremity Duplex no DVT Recommend 30 day cardiac event monitoring to rule out afib. If neg, may consider loop recorder.  LDL 88 HgbA1c 4.7 VTE prophylaxis - lovenox No antithrombotic prior to admission, now on aspirin 81 mg daily and clopidogrel 75 mg daily for 3 months and then ASA alone.  Therapy recommendations:  HH PT Disposition:  likely home   The patient was monitored on telemetry which did not show arryhtmias per report. Stroke work-up will not require a TEE per Neurology.   Echocardiogram as above. Lab work is  reviewed.  Prior to admission, the patient denies chest pain, shortness of breath, dizziness, palpitations, or syncope.  She is recovering from her stroke at home.   She is very hard of hearing. Family is present. She has never been told she has atrial fibrillation. She is willing to take blood thinners if they are indicated. She denies h/o bleeding. She does have occasionally falls, but only 3 in the past several years. Most recently fell and had open fracture of her left wrist.   Past Medical History:  Diagnosis Date   Chronic diastolic heart failure ([place])    Essential hypertension 02/21/2015   Glaucoma    HOH (hard of hearing)    no hearing aids   Hypothyroidism    SIADH (syndrome of inappropriate ADH production) ([place]) 03/01/2022     Surgical History:  Past Surgical History:  Procedure Laterality Date   ABDOMINAL HYSTERECTOMY  2010   CHOLECYSTECTOMY  2000   CLOSED REDUCTION WRIST FRACTURE Left 01/24/2022   Procedure: CLOSED REDUCTION WRIST;  Surgeon: Orene Desanctis, MD;  Location: [place];  Service: Orthopedics;  Laterality: Left;   HIP PINNING,CANNULATED Right 08/05/2019   Procedure: CANNULATED HIP PINNING;  Surgeon: Mcarthur Rossetti, MD;  Location: [facility];  Service: Orthopedics;  Laterality: Right;   I & D EXTREMITY Left 01/24/2022   Procedure: IRRIGATION AND DEBRIDEMENT EXTREMITY;LEFT distal radius open reduction internal fixation, INTRA- articular, 3 fragments;  Surgeon: Orene Desanctis, MD;  Location: [place];  Service: Orthopedics;  Laterality: Left;   ORIF WRIST FRACTURE Left 01/24/2022   Procedure: OPEN REDUCTION INTERNA FIXATION OF LEFT WRIST FRACTURE; LEFT wrist radiographs four views with intraoperative interpretation;  Surgeon: Orene Desanctis,  MD;  Location: [place];  Service: Orthopedics;  Laterality: Left;   SPLENECTOMY, TOTAL       (Not in a hospital admission)   Inpatient Medications:   Allergies: No Known Allergies  Social History   Socioeconomic History    Marital status: Married    Spouse name: Not on file   Number of children: 4   Years of education: Not on file   Highest education level: Not on file  Occupational History   Occupation: retired  Tobacco Use   Smoking status: Never   Smokeless tobacco: Never  Substance and Sexual Activity   Alcohol use: No   Drug use: No   Sexual activity: Not on file  Other Topics Concern   Not on file  Social History Narrative   The patient is retired and married she has 4 children and grandchildren and great-grandchildren   No alcohol tobacco   Social Determinants of Radio broadcast assistant Strain: Not on file  Food Insecurity: Not on file  Transportation Needs: Not on file  Physical Activity: Not on file  Stress: Not on file  Social Connections: Not on file  Intimate Partner Violence: Not on file     Family History  Problem Relation Age of Onset   CAD Mother    Kidney cancer Mother    Heart attack Father    Colon cancer Neg Hx    Esophageal cancer Neg Hx    Stomach cancer Neg Hx    Rectal cancer Neg Hx       Review of Systems: All other systems reviewed and are otherwise negative except as noted above.  Physical Exam: Vitals:   03/22/22 1109  BP: 120/70  Pulse: 79  SpO2: 93%  Weight: 100 lb 9.6 oz (45.6 kg)  Height: 5' (1.524 m)    GEN- The patient is well appearing, alert and oriented x 3 today.   Head- normocephalic, atraumatic Eyes-  Sclera clear, conjunctiva pink Ears- Severely HOH Oropharynx- clear Neck- supple Lungs- Clear to ausculation bilaterally, normal work of breathing Heart- Regular rate and rhythm, no murmurs, rubs or gallops  GI- soft, NT, ND, + BS Extremities- no clubbing, cyanosis, or edema MS- no significant deformity or atrophy Skin- no rash or lesion Psych- euthymic mood, full affect Neuro- no obvious deficits   Labs:   Lab Results  Component Value Date   WBC 8.8 03/02/2022   HGB 11.6 (L) 03/02/2022   HCT 35.0 (L) 03/02/2022    MCV 97.0 03/02/2022   PLT 153 03/02/2022   No results for input(s): "NA", "K", "CL", "CO2", "BUN", "CREATININE", "CALCIUM", "PROT", "BILITOT", "ALKPHOS", "ALT", "AST", "GLUCOSE" in the last 168 hours.  Invalid input(s): "LABALBU"   Radiology/Studies: VAS [place] LOWER EXTREMITY VENOUS (DVT)  Result Date: 03/02/2022  Lower Venous DVT Study Patient Name:  TOMEKIA HELTON [facility]  Date of Exam:   03/02/2022 Medical Rec #: [id number]             Accession #:    [id number] Date of Birth: 1928/04/27             Patient Gender: F Patient Age:   76 years Exam Location:  [facility] Procedure:      VAS [place] LOWER EXTREMITY VENOUS (DVT) Referring Phys: Cornelius Moras XU --------------------------------------------------------------------------------  Indications: Stroke.  Comparison Study: No prior study on file Performing Technologist: Sharion Dove RVS  Examination Guidelines: A complete evaluation includes B-mode imaging, spectral Doppler, color Doppler, and power Doppler as needed of  all accessible portions of each vessel. Bilateral testing is considered an integral part of a complete examination. Limited examinations for reoccurring indications may be performed as noted. The reflux portion of the exam is performed with the patient in reverse Trendelenburg.  +---------+---------------+---------+-----------+----------+--------------+ RIGHT    CompressibilityPhasicitySpontaneityPropertiesThrombus Aging +---------+---------------+---------+-----------+----------+--------------+ CFV      Full           Yes      Yes                                 +---------+---------------+---------+-----------+----------+--------------+ SFJ      Full                                                        +---------+---------------+---------+-----------+----------+--------------+ FV Prox  Full                                                         +---------+---------------+---------+-----------+----------+--------------+ FV Mid   Full                                                        +---------+---------------+---------+-----------+----------+--------------+ FV DistalFull                                                        +---------+---------------+---------+-----------+----------+--------------+ PFV      Full                                                        +---------+---------------+---------+-----------+----------+--------------+ POP      Full           Yes      No                                  +---------+---------------+---------+-----------+----------+--------------+ PTV      Full                                                        +---------+---------------+---------+-----------+----------+--------------+ PERO     Full                                                        +---------+---------------+---------+-----------+----------+--------------+   +---------+---------------+---------+-----------+----------+--------------+ LEFT  CompressibilityPhasicitySpontaneityPropertiesThrombus Aging +---------+---------------+---------+-----------+----------+--------------+ CFV      Full           Yes      Yes                                 +---------+---------------+---------+-----------+----------+--------------+ SFJ      Full                                                        +---------+---------------+---------+-----------+----------+--------------+ FV Prox  Full                                                        +---------+---------------+---------+-----------+----------+--------------+ FV Mid   Full                                                        +---------+---------------+---------+-----------+----------+--------------+ FV DistalFull                                                         +---------+---------------+---------+-----------+----------+--------------+ PFV      Full                                                        +---------+---------------+---------+-----------+----------+--------------+ POP      Full           Yes      Yes                                 +---------+---------------+---------+-----------+----------+--------------+ PTV      Full                                                        +---------+---------------+---------+-----------+----------+--------------+ PERO     Full                                                        +---------+---------------+---------+-----------+----------+--------------+     Summary: BILATERAL: - No evidence of deep vein thrombosis seen in the lower extremities, bilaterally. -No evidence of popliteal cyst, bilaterally.   *See table(s) above for measurements and observations. Electronically signed by Orlie Pollen on 03/02/2022 at 5:18:49 PM.    Final    ECHOCARDIOGRAM  COMPLETE BUBBLE STUDY  Result Date: 03/02/2022    ECHOCARDIOGRAM REPORT   Patient Name:   DEVETTA HAGENOW [facility] Date of Exam: 03/02/2022 Medical Rec #:  [id number]            Height:       60.0 in Accession #:    [id number]           Weight:       103.4 lb Date of Birth:  1928-08-09            BSA:          1.410 m Patient Age:    61 years             BP:           148/67 mmHg Patient Gender: F                    HR:           71 bpm. Exam Location:  Inpatient Procedure: 2D Echo, Cardiac Doppler, Color Doppler and Saline Contrast Bubble            Study Indications:    Stroke  History:        Patient has prior history of Echocardiogram examinations, most                 recent 02/22/2015. Risk Factors:Hypertension.  Sonographer:    Jefferey Pica Referring Phys: [id number] [place]  1. Left ventricular ejection fraction, by estimation, is 40 to 45%. The left ventricle has mildly decreased function. The left ventricle demonstrates  global hypokinesis. There is mild concentric left ventricular hypertrophy. Left ventricular diastolic parameters are indeterminate.  2. Right ventricular systolic function is normal. The right ventricular size is normal. There is normal pulmonary artery systolic pressure.  3. The mitral valve is normal in structure. Mild to moderate mitral valve regurgitation. No evidence of mitral stenosis.  4. Tricuspid valve regurgitation is moderate.  5. The aortic valve is normal in structure. Aortic valve regurgitation is mild. Aortic valve sclerosis/calcification is present, without any evidence of aortic stenosis. Aortic regurgitation PHT measures 691 msec.  6. The inferior vena cava is normal in size with greater than 50% respiratory variability, suggesting right atrial pressure of 3 mmHg.  7. Agitated saline contrast bubble study was negative, with no evidence of any interatrial shunt. FINDINGS  Left Ventricle: Left ventricular ejection fraction, by estimation, is 40 to 45%. The left ventricle has mildly decreased function. The left ventricle demonstrates global hypokinesis. The left ventricular internal cavity size was normal in size. There is  mild concentric left ventricular hypertrophy. Left ventricular diastolic parameters are indeterminate. Right Ventricle: The right ventricular size is normal. No increase in right ventricular wall thickness. Right ventricular systolic function is normal. There is normal pulmonary artery systolic pressure. The tricuspid regurgitant velocity is 2.50 m/s, and  with an assumed right atrial pressure of 5 mmHg, the estimated right ventricular systolic pressure is 28.3 mmHg. Left Atrium: Left atrial size was normal in size. Right Atrium: Right atrial size was normal in size. Pericardium: There is no evidence of pericardial effusion. Mitral Valve: The mitral valve is normal in structure. Mild mitral annular calcification. Mild to moderate mitral valve regurgitation. No evidence of mitral  valve stenosis. Tricuspid Valve: The tricuspid valve is normal in structure. Tricuspid valve regurgitation is moderate . No evidence of tricuspid stenosis. Aortic Valve: The aortic valve is normal in structure. Aortic valve regurgitation  is mild. Aortic regurgitation PHT measures 691 msec. Aortic valve sclerosis/calcification is present, without any evidence of aortic stenosis. Aortic valve peak gradient measures 11.4 mmHg. Pulmonic Valve: The pulmonic valve was normal in structure. Pulmonic valve regurgitation is not visualized. No evidence of pulmonic stenosis. Aorta: The aortic root is normal in size and structure. Venous: The inferior vena cava is normal in size with greater than 50% respiratory variability, suggesting right atrial pressure of 3 mmHg. IAS/Shunts: No atrial level shunt detected by color flow Doppler. Agitated saline contrast was given intravenously to evaluate for intracardiac shunting. Agitated saline contrast bubble study was negative, with no evidence of any interatrial shunt.  LEFT VENTRICLE PLAX 2D LVIDd:         4.30 cm LVIDs:         3.30 cm LV PW:         1.00 cm LV IVS:        1.30 cm LVOT diam:     1.70 cm LV SV:         49 LV SV Index:   35 LVOT Area:     2.27 cm  RIGHT VENTRICLE             IVC RV S prime:     10.10 cm/s  IVC diam: 1.40 cm LEFT ATRIUM             Index        RIGHT ATRIUM          Index LA diam:        2.80 cm 1.99 cm/m   RA Area:     9.48 cm LA Vol (A2C):   39.8 ml 28.22 ml/m  RA Volume:   19.40 ml 13.76 ml/m LA Vol (A4C):   36.0 ml 25.53 ml/m LA Biplane Vol: 37.8 ml 26.80 ml/m  AORTIC VALVE                 PULMONIC VALVE AV Area (Vmax): 1.59 cm     PV Vmax:       0.79 m/s AV Vmax:        168.50 cm/s  PV Peak grad:  2.5 mmHg AV Peak Grad:   11.4 mmHg LVOT Vmax:      118.00 cm/s LVOT Vmean:     74.300 cm/s LVOT VTI:       0.216 m AI PHT:         691 msec  AORTA Ao Root diam: 3.00 cm Ao Asc diam:  3.40 cm MITRAL VALVE                TRICUSPID VALVE MV Area  (PHT): 4.68 cm     TR Peak grad:   25.0 mmHg MV Decel Time: 162 msec     TR Vmax:        250.00 cm/s MV E velocity: 84.80 cm/s MV A velocity: 119.00 cm/s  SHUNTS MV E/A ratio:  0.71         Systemic VTI:  0.22 m                             Systemic Diam: 1.70 cm Kardie Tobb DO Electronically signed by Berniece Salines DO Signature Date/Time: 03/02/2022/11:59:30 AM    Final    CT ANGIO HEAD NECK W WO CM  Result Date: 03/02/2022 CLINICAL DATA:  Initial evaluation for acute stroke. EXAM: CT ANGIOGRAPHY HEAD AND NECK TECHNIQUE: Multidetector CT  imaging of the head and neck was performed using the standard protocol during bolus administration of intravenous contrast. Multiplanar CT image reconstructions and MIPs were obtained to evaluate the vascular anatomy. Carotid stenosis measurements (when applicable) are obtained utilizing NASCET criteria, using the distal internal carotid diameter as the denominator. RADIATION DOSE REDUCTION: This exam was performed according to the departmental dose-optimization program which includes automated exposure control, adjustment of the mA and/or kV according to patient size and/or use of iterative reconstruction technique. CONTRAST:  17m OMNIPAQUE IOHEXOL 350 MG/ML SOLN COMPARISON:  Prior CT and MRI from 03/01/2022. FINDINGS: CTA NECK FINDINGS Aortic arch: Visualized aortic arch normal caliber with normal branch pattern. Moderate atheromatous change about the arch itself. No significant stenosis about the origin of the great vessels. Origin of the brachycephalic artery is not visualized on this exam. Right carotid system: Right common and internal carotid arteries are diffusely tortuous and somewhat dolichoectatic. Eccentric calcified plaque at the right carotid bulb without significant stenosis. Multifocal irregularity about the cervical right ICA could be related to underlying hypertension or possibly FMD. Left carotid system: Left common and internal carotid arteries are tortuous and  somewhat dolichoectatic. Atheromatous change about the left carotid bulb and distal cervical left ICA without significant stenosis. Multifocal irregularity about the cervical left ICA could be related to hypertension and/or FMD. Vertebral arteries: Both vertebral arteries arise from the subclavian arteries. No visible proximal subclavian artery stenosis. Vertebral arteries tortuous but patent without stenosis or dissection. Skeleton: No discrete or worrisome osseous lesions. Mild for age cervical spondylosis. Other neck: No other acute soft tissue abnormality within the neck. Upper chest: Visualized upper chest demonstrates no acute finding. Review of the MIP images confirms the above findings CTA HEAD FINDINGS Anterior circulation: Petrous segments patent bilaterally. Atheromatous change within the carotid siphons with associated mild to moderate narrowing on the right. No significant narrowing about the left siphon. A1 segments patent. Normal anterior communicating complex. Anterior cerebral arteries patent without stenosis. No M1 stenosis or occlusion. No proximal MCA branch occlusion. Distal MCA branches well perfused. Posterior circulation: Both vertebral arteries patent without significant stenosis. Left vertebral artery slightly dominant. Right PICA patent. Left PICA not well seen. Basilar somewhat diminutive and irregular but patent without significant stenosis. Superior cerebellar arteries patent bilaterally. Predominant fetal type origin of both PCAs. PCAs patent to their distal aspects without significant stenosis. Venous sinuses: Grossly patent allowing for timing the contrast bolus. Anatomic variants: As above.  No aneurysm. Review of the MIP images confirms the above findings IMPRESSION: 1. Negative CTA for large vessel occlusion or other emergent finding. 2. Atheromatous change about the carotid siphons with associated mild to moderate narrowing on the right. No other hemodynamically significant or  correctable stenosis about the major arterial vasculature of the head and neck. 3. Diffuse tortuosity and dolichoectasia of the major arterial vasculature of the neck, suggesting chronic underlying hypertension. Multifocal irregularity of both cervical ICAs favored to be hypertensive in nature, although underlying FMD could also have this appearance. Electronically Signed   By: BJeannine BogaM.D.   On: 03/02/2022 02:46   MR BRAIN WO CONTRAST  Result Date: 03/01/2022 CLINICAL DATA:  Stroke.  Left facial droop EXAM: MRI HEAD WITHOUT CONTRAST TECHNIQUE: Multiplanar, multiecho pulse sequences of the brain and surrounding structures were obtained without intravenous contrast. COMPARISON:  CT head 03/01/2022.  MRI head 01/24/2022 FINDINGS: Brain: Small areas of restricted diffusion compatible with acute or subacute infarct. Small areas of increased signal on diffusion-weighted imaging in  the right frontal white matter and right parietal white matter. Small area increased signal in the right insula. Mild hyperintensity on diffusion weighted imaging in the left frontal white matter not present on the prior MRI. These are most likely due to acute or subacute infarctions. Generalized atrophy. Moderate chronic microvascular ischemic change in the white matter and pons. Multiple areas of chronic hemorrhage in the cerebellum and cerebral hemispheres bilaterally. No mass lesion. Vascular: Normal arterial flow voids Skull and upper cervical spine: No focal skeletal lesion. Sinuses/Orbits: Paranasal sinuses clear. Bilateral cataract extraction Other: None IMPRESSION: Multiple small areas of increased signal on diffusion-weighted imaging consistent with acute or subacute infarct. These were not present on the MRI of 01/24/2022 Atrophy and moderate chronic microvascular ischemic change. Multiple areas of chronic microhemorrhage in the brain. Correlate with hypertension history. Electronically Signed   By: Franchot Gallo  M.D.   On: 03/01/2022 20:25   CT HEAD WO CONTRAST (5MM)  Result Date: 03/01/2022 CLINICAL DATA:  Mental status change, unknown cause; left sided facial droop, confusion, Last seen well yesterday 10 pm EXAM: CT HEAD WITHOUT CONTRAST TECHNIQUE: Contiguous axial images were obtained from the base of the skull through the vertex without intravenous contrast. RADIATION DOSE REDUCTION: This exam was performed according to the departmental dose-optimization program which includes automated exposure control, adjustment of the mA and/or kV according to patient size and/or use of iterative reconstruction technique. COMPARISON:  01/23/2022 FINDINGS: Brain: There is no acute intracranial hemorrhage, mass effect, or edema. Gray-white differentiation is preserved. There is no extra-axial fluid collection. Prominence of the ventricles and sulci reflects stable parenchymal volume loss. Patchy and confluent areas of hypoattenuation in the supratentorial white matter are nonspecific but probably reflects stable chronic microvascular ischemic changes. There is superimposed chronic small vessel infarcts of the central white matter and deep gray nuclei. Vascular: No hyperdense vessel. There is atherosclerotic calcification at the skull base. Skull: Calvarium is unremarkable. Sinuses/Orbits: No acute finding. Other: None. IMPRESSION: No acute intracranial abnormality. Chronic microvascular ischemic changes with chronic small vessel infarcts. Difficult to evaluate for a new superimposed small infarct. Electronically Signed   By: Macy Mis M.D.   On: 03/01/2022 14:32    12-lead ECG 03/02/2022 showed NSR at 73 bpm (personally reviewed) All prior EKG's in EPIC reviewed with no documented atrial fibrillation  Telemetry no report of arryhtmia (personally reviewed)  Assessment and Plan:  1. Cryptogenic stroke The patient presented with cryptogenic stroke.  The patient does not have a TEE planned.   I spoke at length with the  patient about monitoring for afib with an implantable loop recorder.  We will first proceed 2 week Zio as her insurance would require. She will follow up with Dr. Quentin Ore after to further discuss.  She is willing to undergo loop and take [place] if indicated.   Follow up 6 weeks with Dr. Quentin Ore to discuss and potentially implant loop at that visit.   Shirley Friar, PA-C 03/22/2022 11:41 AM

## 2022-03-20 NOTE — Telephone Encounter (Addendum)
Spoke to the patient's husband about heart monitor. He said they are coming 6/21 and bringing the monitor with them for assistance/and or to see if they need a new one.    Spoke to [facility]) stated she will come with them to help get the monitor sorted.

## 2022-03-22 ENCOUNTER — Ambulatory Visit (INDEPENDENT_AMBULATORY_CARE_PROVIDER_SITE_OTHER): Payer: Medicare Other

## 2022-03-22 ENCOUNTER — Encounter: Payer: Self-pay | Admitting: Student

## 2022-03-22 ENCOUNTER — Ambulatory Visit: Payer: Medicare Other | Admitting: Student

## 2022-03-22 VITALS — BP 120/70 | HR 79 | Ht 60.0 in | Wt 100.6 lb

## 2022-03-22 DIAGNOSIS — I4891 Unspecified atrial fibrillation: Secondary | ICD-10-CM

## 2022-03-22 DIAGNOSIS — I639 Cerebral infarction, unspecified: Secondary | ICD-10-CM | POA: Diagnosis not present

## 2022-03-22 NOTE — Progress Notes (Unsigned)
[id number] zio xt from office inventory applied to patient.

## 2022-03-22 NOTE — Patient Instructions (Signed)
Medication Instructions:  Your physician recommends that you continue on your current medications as directed. Please refer to the Current Medication list given to you today.  *If you need a refill on your cardiac medications before your next appointment, please call your pharmacy*   Lab Work: None If you have labs (blood work) drawn today and your tests are completely normal, you will receive your results only by: [place] (if you have MyChart) OR A paper copy in the mail If you have any lab test that is abnormal or we need to change your treatment, we will call you to review the results.   Follow-Up: At [facility], you and your health needs are our priority.  As part of our continuing mission to provide you with exceptional heart care, we have created designated Provider Care Teams.  These Care Teams include your primary Cardiologist (physician) and Advanced Practice Providers (APPs -  Physician Assistants and Nurse Practitioners) who all work together to provide you with the care you need, when you need it.  We recommend signing up for the patient portal called "MyChart".  Sign up information is provided on this After Visit Summary.  MyChart is used to connect with patients for Virtual Visits (Telemedicine).  Patients are able to view lab/test results, encounter notes, upcoming appointments, etc.  Non-urgent messages can be sent to your provider as well.   To learn more about what you can do with MyChart, go to [email or web address].    Your next appointment:   6 week(s) to discuss Linq  The format for your next appointment:   In Person  Provider:   Lars Mage, MD    Other Instructions:  Bryn Gulling- Long Term Monitor Instructions  Your physician has requested you wear a ZIO patch monitor for 14 days.  This is a single patch monitor. [employer] supplies one patch monitor per enrollment. Additional stickers are not available. Please do not apply patch if you will be  having a Nuclear Stress Test,  Echocardiogram, Cardiac CT, MRI, or Chest Xray during the period you would be wearing the  monitor. The patch cannot be worn during these tests. You cannot remove and re-apply the  ZIO XT patch monitor.  Your ZIO patch monitor will be mailed 3 day USPS to your address on file. It may take 3-5 days  to receive your monitor after you have been enrolled.  Once you have received your monitor, please review the enclosed instructions. Your monitor  has already been registered assigning a specific monitor serial # to you.  Billing and Patient Assistance Program Information  We have supplied [employer] with any of your insurance information on file for billing purposes. [employer] offers a sliding scale Patient Assistance Program for patients that do not have  insurance, or whose insurance does not completely cover the cost of the ZIO monitor.  You must apply for the Patient Assistance Program to qualify for this discounted rate.  To apply, please call [employer] at [phone], select option 4, select option 2, ask to apply for  Patient Assistance Program. Theodore Demark will ask your household income, and how many people  are in your household. They will quote your out-of-pocket cost based on that information.  [employer] will also be able to set up a 5-month interest-free payment plan if needed.  Applying the monitor   Shave hair from upper left chest.  Hold abrader disc by orange tab. Rub abrader in 40 strokes over the upper left chest as  indicated  in your monitor instructions.  Clean area with 4 enclosed alcohol pads. Let dry.  Apply patch as indicated in monitor instructions. Patch will be placed under collarbone on left  side of chest with arrow pointing upward.  Rub patch adhesive wings for 2 minutes. Remove white label marked "1". Remove the white  label marked "2". Rub patch adhesive wings for 2 additional minutes.  While looking in a mirror, press and release button  in center of patch. A small green light will  flash 3-4 times. This will be your only indicator that the monitor has been turned on.  Do not shower for the first 24 hours. You may shower after the first 24 hours.  Press the button if you feel a symptom. You will hear a small click. Record Date, Time and  Symptom in the Patient Logbook.  When you are ready to remove the patch, follow instructions on the last 2 pages of Patient  Logbook. Stick patch monitor onto the last page of Patient Logbook.  Place Patient Logbook in the blue and white box. Use locking tab on box and tape box closed  securely. The blue and white box has prepaid postage on it. Please place it in the mailbox as  soon as possible. Your physician should have your test results approximately 7 days after the  monitor has been mailed back to [facility].  Call [place] at [phone] if you have questions regarding  your ZIO XT patch monitor. Call them immediately if you see an orange light blinking on your  monitor.  If your monitor falls off in less than 4 days, contact our Monitor department at [phone].  If your monitor becomes loose or falls off after 4 days call [employer] at [phone] for  suggestions on securing your monitor

## 2022-04-19 NOTE — Progress Notes (Deleted)
[facility] [address] [place]. [place] [address] [phone]       [place]aken[place] NOTE  Ms. Mackenzie Madden Date of Birth:  [birth year] Medical Record Number:  [id number]   Reason for Referral:  hospital stroke follow up    SUBJECTIVE:   CHIEF COMPLAINT:  No chief complaint on file.   HPI:   Ms. Mackenzie Madden is a 88 y.o. female with history of hypertension, glaucoma, pubic fracture (3 years ago) SIADH, recurrent UTIs, and recent left wrist fracture post fall 12/2021 who presented on 03/01/2022 with confusion, nonsensical speech, and 2 weeks of vertigo.  Personally reviewed hospitalization pertinent progress notes, lab work and imaging.  Evaluated by Dr. 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  CTA head/neck negative LVO.  EF 40 to 45%.  LE Doppler negative for DVT.  Recommended cardiac monitoring with either 30-day monitor or ILR to rule out A-fib.  LDL 88.  A1c 4.7.  Recommended DAPT for 3 weeks and aspirin alone as well as initiate Crestor 10 mg daily.  Orthostatic vitals negative.  Treated for UTI.  PT/OT recommended home health and discharged home.           PERTINENT IMAGING/LABS  Prior hospitalization 03/01/2022 Code Stroke CT head No acute abnormality. Small vessel disease. Atrophy. ASPECTS 10.    CTA head & neck- negative for LVO MRI- right frontal white matter and right parietal white matter. Small area increased signal in the right insula. Mild hyperintensity on diffusion weighted imaging in the left frontal white matter not present on the prior MRI. 2D Echo- results EF 40-45% Vas Lower Extremity Duplex no DVT  LDL 88 HgbA1c 4.7    ROS:   14 system review of systems performed and negative with exception of ***  PMH:  Past Medical History:  Diagnosis Date   Chronic diastolic heart failure ([place])    Essential hypertension 02/21/2015    Glaucoma    HOH (hard of hearing)    no hearing aids   Hypothyroidism    SIADH (syndrome of inappropriate ADH production) ([place]) 03/01/2022    PSH:  Past Surgical History:  Procedure Laterality Date   ABDOMINAL HYSTERECTOMY  2010   CHOLECYSTECTOMY  2000   CLOSED REDUCTION WRIST FRACTURE Left 01/24/2022   Procedure: CLOSED REDUCTION WRIST;  Surgeon: Orene Desanctis, MD;  Location: [place];  Service: Orthopedics;  Laterality: Left;   HIP PINNING,CANNULATED Right 08/05/2019   Procedure: CANNULATED HIP PINNING;  Surgeon: Mcarthur Rossetti, MD;  Location: [facility];  Service: Orthopedics;  Laterality: Right;   I & D EXTREMITY Left 01/24/2022   Procedure: IRRIGATION AND DEBRIDEMENT EXTREMITY;LEFT distal radius open reduction internal fixation, INTRA- articular, 3 fragments;  Surgeon: Orene Desanctis, MD;  Location: [place];  Service: Orthopedics;  Laterality: Left;   ORIF WRIST FRACTURE Left 01/24/2022   Procedure: OPEN REDUCTION INTERNA FIXATION OF LEFT WRIST FRACTURE; LEFT wrist radiographs four views with intraoperative interpretation;  Surgeon: Orene Desanctis, MD;  Location: [place];  Service: Orthopedics;  Laterality: Left;   SPLENECTOMY, TOTAL      Social History:  Social History   Socioeconomic History   Marital status: Married    Spouse name: Not on file   Number of children: 4   Years of education: Not on file   Highest education level: Not on file  Occupational History   Occupation: retired  Tobacco Use   Smoking status:  Never   Smokeless tobacco: Never  Substance and Sexual Activity   Alcohol use: No   Drug use: No   Sexual activity: Not on file  Other Topics Concern   Not on file  Social History Narrative   The patient is retired and married she has 4 children and grandchildren and great-grandchildren   No alcohol tobacco   Social Determinants of Radio broadcast assistant Strain: Not on file  Food Insecurity: Not on file  Transportation Needs: Not on file  Physical  Activity: Not on file  Stress: Not on file  Social Connections: Not on file  Intimate Partner Violence: Not on file    Family History:  Family History  Problem Relation Age of Onset   CAD Mother    Kidney cancer Mother    Heart attack Father    Colon cancer Neg Hx    Esophageal cancer Neg Hx    Stomach cancer Neg Hx    Rectal cancer Neg Hx     Medications:   Current Outpatient Medications on File Prior to Visit  Medication Sig Dispense Refill   acetaminophen (TYLENOL) 325 MG tablet Take 2 tablets (650 mg total) by mouth every 6 (six) hours as needed for mild pain (or Fever >/= 101).     amLODipine (NORVASC) 5 MG tablet Take 1 tablet (5 mg total) by mouth daily. 30 tablet 11   aspirin EC 81 MG tablet Take 1 tablet (81 mg total) by mouth daily. Swallow whole. 30 tablet 12   Calcium 250 MG CAPS Take 250 mg by mouth daily.     clopidogrel (PLAVIX) 75 MG tablet Take 1 tablet (75 mg total) by mouth daily. 20 tablet 0   dorzolamide-timolol (COSOPT) 22.3-6.8 MG/ML ophthalmic solution Place 1 drop into both eyes 2 (two) times daily.     latanoprost (XALATAN) 0.005 % ophthalmic solution Place 1 drop into both eyes at bedtime.  11   levothyroxine (SYNTHROID) 88 MCG tablet Take 1 tablet (88 mcg total) by mouth daily before breakfast. 30 tablet 0   meclizine (ANTIVERT) 12.5 MG tablet Take 1 tablet (12.5 mg total) by mouth daily. 30 tablet 0   Multiple Vitamin (MULTIVITAMIN WITH MINERALS) TABS tablet Take 1 tablet by mouth daily.     rosuvastatin (CRESTOR) 10 MG tablet Take 1 tablet (10 mg total) by mouth daily. 30 tablet 11   No current facility-administered medications on file prior to visit.    Allergies:  No Known Allergies    OBJECTIVE:  Physical Exam  There were no vitals filed for this visit. There is no height or weight on file to calculate BMI. No results found.     02/20/2022   11:51 AM  Depression screen PHQ 2/9  Decreased Interest 2  Down, Depressed, Hopeless 0  PHQ  - 2 Score 2  Altered sleeping 0  Tired, decreased energy 1  Change in appetite 0  Feeling bad or failure about yourself  0  Trouble concentrating 0  Moving slowly or fidgety/restless 1  Suicidal thoughts 0  PHQ-9 Score 4  Difficult doing work/chores Somewhat difficult     General: well developed, well nourished, seated, in no evident distress Head: head normocephalic and atraumatic.   Neck: supple with no carotid or supraclavicular bruits Cardiovascular: regular rate and rhythm, no murmurs Musculoskeletal: no deformity Skin:  no rash/petichiae Vascular:  Normal pulses all extremities   Neurologic Exam Mental Status: Awake and fully alert. Oriented to place and time. Recent and  remote memory intact. Attention span, concentration and fund of knowledge appropriate. Mood and affect appropriate.  Cranial Nerves: Fundoscopic exam reveals sharp disc margins. Pupils equal, briskly reactive to light. Extraocular movements full without nystagmus. Visual fields full to confrontation. Hearing intact. Facial sensation intact. Face, tongue, palate moves normally and symmetrically.  Motor: Normal bulk and tone. Normal strength in all tested extremity muscles Sensory.: intact to touch , pinprick , position and vibratory sensation.  Coordination: Rapid alternating movements normal in all extremities. Finger-to-nose and heel-to-shin performed accurately bilaterally. Gait and Station: Arises from chair without difficulty. Stance is normal. Gait demonstrates normal stride length and balance with ***. Tandem walk and heel toe ***.  Reflexes: 1+ and symmetric. Toes downgoing.     NIHSS  *** Modified [place]  ***      ASSESSMENT: Mackenzie Madden is a 88 y.o. year old female with scattered right insular cortex, MCA and left frontal small infarcts on 4/62/8638 embolic secondary to unknown source. Vascular risk factors include HTN, HLD, cardiomyopathy and advanced age.      PLAN:  Cryptogenic  stroke:  Residual deficit: ***.  Complete cardiac monitor, if negative consider ILR placement. Has f/u with cardiology 8/7 Continue aspirin 81 mg daily  and Crestor 10 mg daily for secondary stroke prevention.   Discussed secondary stroke prevention measures and importance of close PCP follow up for aggressive stroke risk factor management including BP goal<130/90, HLD with LDL goal<70 and DM with A1c.<7 .  Stroke labs 01/2022: LDL 88, A1c 4.7 I have gone over the pathophysiology of stroke, warning signs and symptoms, risk factors and their management in some detail with instructions to go to the closest emergency room for symptoms of concern.     Follow up in *** or call earlier if needed   CC:  [facility] provider: Dr. Leonie Man PCP: Lavone Orn, MD    I spent *** minutes of face-to-face and non-face-to-face time with patient.  This included previsit chart review including review of recent hospitalization, lab review, study review, order entry, electronic health record documentation, patient education regarding recent stroke including etiology, secondary stroke prevention measures and importance of managing stroke risk factors, residual deficits and typical recovery time and answered all other questions to patient satisfaction   Frann Rider, AGNP-BC  [facility] [address] [place], [place] [address]  Phone [phone] Fax [phone] Note: This document was prepared with digital dictation and possible smart phrase technology. Any transcriptional errors that result from this process are unintentional.

## 2022-04-20 ENCOUNTER — Inpatient Hospital Stay: Payer: Medicare Other | Admitting: Adult Health

## 2022-04-20 ENCOUNTER — Telehealth: Payer: Self-pay | Admitting: Adult Health

## 2022-04-20 NOTE — Telephone Encounter (Signed)
LVM and mychart msg informing pt of need to reschedule 7/20 appointment - Frann Rider out

## 2022-05-05 NOTE — Progress Notes (Deleted)
Electrophysiology Office Note:    Date:  05/05/2022   ID:  Mackenzie, Madden Nov 27, 1927, MRN [id number]  PCP:  Lavone Orn, MD  [facility] Cardiologist:  None  [facility] Electrophysiologist:  None   Referring MD: Lavone Orn, MD   Chief Complaint: Cryptogenic stroke  History of Present Illness:    Mackenzie Madden is a 88 y.o. female who presents for an evaluation of cryptogenic stroke at the request of Dr Mackenzie Madden. Their medical history includes hypothyroidism, SIADH, HTN and chronic diastolic heart failure.  She was seen by Mackenzie Madden in the EP clinic 03/22/2022. A 2 week zio monitor was ordered at that appointment which showed no AF. She presents to discuss loop recorder monitoring.     Past Medical History:  Diagnosis Date   Chronic diastolic heart failure ([place])    Essential hypertension 02/21/2015   Glaucoma    HOH (hard of hearing)    no hearing aids   Hypothyroidism    SIADH (syndrome of inappropriate ADH production) ([place]) 03/01/2022    Past Surgical History:  Procedure Laterality Date   ABDOMINAL HYSTERECTOMY  2010   CHOLECYSTECTOMY  2000   CLOSED REDUCTION WRIST FRACTURE Left 01/24/2022   Procedure: CLOSED REDUCTION WRIST;  Surgeon: Orene Desanctis, MD;  Location: [place];  Service: Orthopedics;  Laterality: Left;   HIP PINNING,CANNULATED Right 08/05/2019   Procedure: CANNULATED HIP PINNING;  Surgeon: Mcarthur Rossetti, MD;  [facility];  Service: Orthopedics;  Laterality: Right;   I & D EXTREMITY Left 01/24/2022   Procedure: IRRIGATION AND DEBRIDEMENT EXTREMITY;LEFT distal radius open reduction internal fixation, INTRA- articular, 3 fragments;  Surgeon: Orene Desanctis, MD;  Location: [place];  Service: Orthopedics;  Laterality: Left;   ORIF WRIST FRACTURE Left 01/24/2022   Procedure: OPEN REDUCTION INTERNA FIXATION OF LEFT WRIST FRACTURE; LEFT wrist radiographs four views with intraoperative interpretation;  Surgeon: Orene Desanctis, MD;  Location: [facility];   Service: Orthopedics;  Laterality: Left;   SPLENECTOMY, TOTAL      Current Medications: No outpatient medications have been marked as taking for the 05/08/22 encounter (Appointment) with Mackenzie Epley, MD.     Allergies:   Patient has no known allergies.   Social History   Socioeconomic History   Marital status: Married    Spouse name: Not on file   Number of children: 4   Years of education: Not on file   Highest education level: Not on file  Occupational History   Occupation: retired  Tobacco Use   Smoking status: Never   Smokeless tobacco: Never  Substance and Sexual Activity   Alcohol use: No   Drug use: No   Sexual activity: Not on file  Other Topics Concern   Not on file  Social History Narrative   The patient is retired and married she has 4 children and grandchildren and great-grandchildren   No alcohol tobacco   Social Determinants of Radio broadcast assistant Strain: Not on file  Food Insecurity: Not on file  Transportation Needs: Not on file  Physical Activity: Not on file  Stress: Not on file  Social Connections: Not on file     Family History: The patient's family history includes CAD in her mother; Heart attack in her father; Kidney cancer in her mother. There is no history of Colon cancer, Esophageal cancer, Stomach cancer, or Rectal cancer.  ROS:   Please see the history of present illness.    All other systems reviewed and are  negative.  EKGs/Labs/Other Studies Reviewed:    The following studies were reviewed today:  04/14/2022 Zio HR 54 - 136 bpm, average 72 bpm. 21 nonsustained SVT, longest 11 beats. Bundle branch block is present. Rare supraventricular and ventricular ectopy. No sustained arrhythmias. No atrial fibrillation.     Recent Labs: 01/27/2022: B Natriuretic Peptide 380.9 03/02/2022: ALT 10; BUN 6; Creatinine, Ser 0.79; Hemoglobin 11.6; Magnesium 1.6; Platelets 153; Potassium 3.7; Sodium 135; TSH 6.258  Recent Lipid  Panel    Component Value Date/Time   CHOL 136 03/02/2022 0433   TRIG 59 03/02/2022 0433   HDL 36 (L) 03/02/2022 0433   CHOLHDL 3.8 03/02/2022 0433   VLDL 12 03/02/2022 0433   LDLCALC 88 03/02/2022 0433    Physical Exam:    VS:  There were no vitals taken for this visit.    Wt Readings from Last 3 Encounters:  03/22/22 100 lb 9.6 oz (45.6 kg)  02/20/22 103 lb 6.4 oz (46.9 kg)  01/31/22 103 lb 6.3 oz (46.9 kg)     GEN: *** Well nourished, well developed in no acute distress HEENT: Normal NECK: No JVD; No carotid bruits LYMPHATICS: No lymphadenopathy CARDIAC: ***RRR, no murmurs, rubs, gallops RESPIRATORY:  Clear to auscultation without rales, wheezing or rhonchi  ABDOMEN: Soft, non-tender, non-distended MUSCULOSKELETAL:  No edema; No deformity  SKIN: Warm and dry NEUROLOGIC:  Alert and oriented x 3 PSYCHIATRIC:  Normal affect       ASSESSMENT:    1. Acute stroke due to ischemia ([place])   2. Chronic diastolic heart failure ([place])   3. Essential hypertension    PLAN:    In order of problems listed above:  #Cryptogenic stroke No AF detected thus far. Discussed the role of loop recorder monitoring and she would like to proceed.   Loop recorder implant procedure discussed in detail including the risks and monthly monitoring costs and she wishes to proceed.  #HFpEF Warm and relatively euvolemic. Continue current medical therapy.  #HTN Controlled today.***       Total time spent with patient today *** minutes. This includes reviewing records, evaluating the patient and coordinating care.  Medication Adjustments/Labs and Tests Ordered: Current medicines are reviewed at length with the patient today.  Concerns regarding medicines are outlined above.  No orders of the defined types were placed in this encounter.  No orders of the defined types were placed in this encounter.    Signed, Hilton Cork. Quentin Ore, MD, [facility], [facility] 05/05/2022 5:28 PM     Electrophysiology [facility]  ---------  SURGEON:  Mackenzie Epley, MD     PREPROCEDURE DIAGNOSIS:  Cryptogenic stroke    POSTPROCEDURE DIAGNOSIS: Cryptogenic stroke     PROCEDURES:   1. Implantable loop recorder implantation    INTRODUCTION:  ZIGGY REVELES presents with a history of cryptogenic stroke The costs of loop recorder monitoring have been discussed with the patient.    DESCRIPTION OF PROCEDURE:  Informed written consent was obtained.  The patient required no sedation for the procedure today.  Mapping over the patient's chest was performed to identify the area where electrograms were most prominent for ILR recording.  This area was found to be the left parasternal region over the 4th intercostal space. The patients left chest was therefore prepped and draped in the usual sterile fashion. The skin overlying the left parasternal region was infiltrated with lidocaine for local analgesia.  A 0.5-cm incision was made over the left parasternal region over  the 3rd intercostal space.  A subcutaneous ILR pocket was fashioned using a combination of sharp and blunt dissection.  A {[id number]} implantable loop recorder was then placed into the pocket  R waves were very prominent and measured >0.1m.  Steri- Strips and a sterile dressing were then applied.  There were no early apparent complications.     CONCLUSIONS:   1. Successful implantation of a implantable loop recorder for Cryptogenic stroke  2. No early apparent complications.   CLysbeth GalasT. LQuentin Ore MD, [facility] Electrophysiology

## 2022-05-08 ENCOUNTER — Ambulatory Visit: Payer: Medicare Other | Admitting: Cardiology

## 2022-05-08 ENCOUNTER — Encounter: Payer: Self-pay | Admitting: Cardiology

## 2022-05-08 VITALS — BP 132/70 | HR 64 | Ht 60.0 in | Wt 100.0 lb

## 2022-05-08 DIAGNOSIS — I639 Cerebral infarction, unspecified: Secondary | ICD-10-CM

## 2022-05-08 DIAGNOSIS — I5032 Chronic diastolic (congestive) heart failure: Secondary | ICD-10-CM

## 2022-05-08 DIAGNOSIS — I10 Essential (primary) hypertension: Secondary | ICD-10-CM

## 2022-05-08 NOTE — Patient Instructions (Signed)
Medication Instructions:  Your physician recommends that you continue on your current medications as directed. Please refer to the Current Medication list given to you today.  Labwork: None ordered.  Testing/Procedures: None ordered.  Follow-Up:  Your physician wants you to follow-up in: as needed in person. Dr. Quentin Ore will follow your device remotely.   Implantable Loop Recorder Placement, Care After This sheet gives you information about how to care for yourself after your procedure. Your health care provider may also give you more specific instructions. If you have problems or questions, contact your health care provider. What can I expect after the procedure? After the procedure, it is common to have: Soreness or discomfort near the incision. Some swelling or bruising near the incision.  Follow these instructions at home: Incision care  Monitor your cardiac device site for redness, swelling, and drainage. Call the device clinic at [phone] if you experience these symptoms or fever/chills.  Keep the large square bandage on your site for 24 hours and then you may remove it yourself. Keep the steri-strips underneath in place.   You may shower after 72 hours / 3 days from your procedure with the steri-strips in place. They will usually fall off on their own, or may be removed after 10 days. Pat dry.   Avoid lotions, ointments, or perfumes over your incision until it is well-healed.  Please do not submerge in water until your site is completely healed.   Your device is MRI compatible.   Remote monitoring is used to monitor your cardiac device from home. This monitoring is scheduled every month by our office. It allows [place] to keep an eye on the function of your device to ensure it is working properly.  If your wound site starts to bleed apply pressure.    For help with the monitor please call [employer] Monitor Support Specialist directly at [phone].    If you have any  questions/concerns please call the device clinic at [phone].  Activity  Return to your normal activities.  General instructions Follow instructions from your health care provider about how to manage your implantable loop recorder and transmit the information. Learn how to activate a recording if this is necessary for your type of device. You may go through a metal detection gate, and you may let someone hold a metal detector over your chest. Show your ID card if needed. Do not have an MRI unless you check with your health care provider first. Take over-the-counter and prescription medicines only as told by your health care provider. Keep all follow-up visits as told by your health care provider. This is important. Contact a health care provider if: You have redness, swelling, or pain around your incision. You have a fever. You have pain that is not relieved by your pain medicine. You have triggered your device because of fainting (syncope) or because of a heartbeat that feels like it is racing, slow, fluttering, or skipping (palpitations). Get help right away if you have: Chest pain. Difficulty breathing. Summary After the procedure, it is common to have soreness or discomfort near the incision. Change your dressing as told by your health care provider. Follow instructions from your health care provider about how to manage your implantable loop recorder and transmit the information. Keep all follow-up visits as told by your health care provider. This is important. This information is not intended to replace advice given to you by your health care provider. Make sure you discuss any questions you have with your  health care provider. Document Released: 08/30/2015 Document Revised: 11/03/2017 Document Reviewed: 11/03/2017 Elsevier Patient Education  2020 [employer].

## 2022-05-08 NOTE — Progress Notes (Signed)
Electrophysiology Office Note:    Date:  05/08/2022   ID:  Anastasya, Jewell 1928/06/04, MRN [id number]  PCP:  Lavone Orn, MD  [facility] Cardiologist:  None  [facility] Electrophysiologist:  Vickie Epley, MD   Referring MD: Lavone Orn, MD   Chief Complaint: Cryptogenic stroke  History of Present Illness:    FRANCESA EUGENIO is a 88 y.o. female who presents for an evaluation of cryptogenic stroke at the request of Dr Erlinda Hong. Their medical history includes hypothyroidism, SIADH, HTN and chronic diastolic heart failure.   She was seen by Jonni Sanger in the EP clinic 03/22/2022. A 2 week zio monitor was ordered at that appointment which showed no AF. She presents to discuss loop recorder monitoring.  Today, she is accompanied by her daughter. She confirms that she is hard of hearing.  She complains of some ankle swelling that tends to become worse throughout the day, especially if she is sitting for long periods. In the evenings she does try to elevate her legs. By the next morning her swelling usually improves.   They deny any palpitations, chest pain, or shortness of breath. No lightheadedness, headaches, syncope, orthopnea, or PND.     Past Medical History:  Diagnosis Date   Chronic diastolic heart failure ([place])    Essential hypertension 02/21/2015   Glaucoma    HOH (hard of hearing)    no hearing aids   Hypothyroidism    SIADH (syndrome of inappropriate ADH production) ([place]) 03/01/2022    Past Surgical History:  Procedure Laterality Date   ABDOMINAL HYSTERECTOMY  2010   CHOLECYSTECTOMY  2000   CLOSED REDUCTION WRIST FRACTURE Left 01/24/2022   Procedure: CLOSED REDUCTION WRIST;  Surgeon: Orene Desanctis, MD;  Location: [address];  Service: Orthopedics;  Laterality: Left;   HIP PINNING,CANNULATED Right 08/05/2019   Procedure: CANNULATED HIP PINNING;  Surgeon: Mcarthur Rossetti, MD;  [facility];  Service: Orthopedics;  Laterality: Right;   I & D  EXTREMITY Left 01/24/2022   Procedure: IRRIGATION AND DEBRIDEMENT EXTREMITY;LEFT distal radius open reduction internal fixation, INTRA- articular, 3 fragments;  Surgeon: Orene Desanctis, MD;  Location: [place];  Service: Orthopedics;  Laterality: Left;   ORIF WRIST FRACTURE Left 01/24/2022   Procedure: OPEN REDUCTION INTERNA FIXATION OF LEFT WRIST FRACTURE; LEFT wrist radiographs four views with intraoperative interpretation;  Surgeon: Orene Desanctis, MD;  Location: [place];  Service: Orthopedics;  Laterality: Left;   SPLENECTOMY, TOTAL      Current Medications: Current Meds  Medication Sig   acetaminophen (TYLENOL) 325 MG tablet Take 2 tablets (650 mg total) by mouth every 6 (six) hours as needed for mild pain (or Fever >/= 101).   amLODipine (NORVASC) 5 MG tablet Take 1 tablet (5 mg total) by mouth daily.   aspirin EC 81 MG tablet Take 1 tablet (81 mg total) by mouth daily. Swallow whole.   Calcium 250 MG CAPS Take 250 mg by mouth daily.   clopidogrel (PLAVIX) 75 MG tablet Take 1 tablet (75 mg total) by mouth daily.   dorzolamide-timolol (COSOPT) 22.3-6.8 MG/ML ophthalmic solution Place 1 drop into both eyes 2 (two) times daily.   latanoprost (XALATAN) 0.005 % ophthalmic solution Place 1 drop into both eyes at bedtime.   levothyroxine (SYNTHROID) 88 MCG tablet Take 1 tablet (88 mcg total) by mouth daily before breakfast.   Multiple Vitamin (MULTIVITAMIN WITH MINERALS) TABS tablet Take 1 tablet by mouth daily.   rosuvastatin (CRESTOR) 10 MG tablet Take 1  tablet (10 mg total) by mouth daily.   [DISCONTINUED] meclizine (ANTIVERT) 12.5 MG tablet Take 1 tablet (12.5 mg total) by mouth daily.     Allergies:   Patient has no known allergies.   Social History   Socioeconomic History   Marital status: Married    Spouse name: Not on file   Number of children: 4   Years of education: Not on file   Highest education level: Not on file  Occupational History   Occupation: retired  Tobacco Use    Smoking status: Never   Smokeless tobacco: Never  Substance and Sexual Activity   Alcohol use: No   Drug use: No   Sexual activity: Not on file  Other Topics Concern   Not on file  Social History Narrative   The patient is retired and married she has 4 children and grandchildren and great-grandchildren   No alcohol tobacco   Social Determinants of Radio broadcast assistant Strain: Not on file  Food Insecurity: Not on file  Transportation Needs: Not on file  Physical Activity: Not on file  Stress: Not on file  Social Connections: Not on file     Family History: The patient's family history includes CAD in her mother; Heart attack in her father; Kidney cancer in her mother. There is no history of Colon cancer, Esophageal cancer, Stomach cancer, or Rectal cancer.  ROS:   Please see the history of present illness.    (+) Bilateral ankle swelling All other systems reviewed and are negative.  EKGs/Labs/Other Studies Reviewed:    The following studies were reviewed today:  04/14/2022 Zio HR 54 - 136 bpm, average 72 bpm. 21 nonsustained SVT, longest 11 beats. Bundle branch block is present. Rare supraventricular and ventricular ectopy. No sustained arrhythmias. No atrial fibrillation.   EKG:   EKG is personally reviewed.  05/08/2022:  EKG was not ordered.   Recent Labs: 01/27/2022: B Natriuretic Peptide 380.9 03/02/2022: ALT 10; BUN 6; Creatinine, Ser 0.79; Hemoglobin 11.6; Magnesium 1.6; Platelets 153; Potassium 3.7; Sodium 135; TSH 6.258   Recent Lipid Panel    Component Value Date/Time   CHOL 136 03/02/2022 0433   TRIG 59 03/02/2022 0433   HDL 36 (L) 03/02/2022 0433   CHOLHDL 3.8 03/02/2022 0433   VLDL 12 03/02/2022 0433   LDLCALC 88 03/02/2022 0433    Physical Exam:    VS:  BP 132/70   Pulse 64   Ht 5' (1.524 m)   Wt 100 lb (45.4 kg)   SpO2 98%   BMI 19.53 kg/m     Wt Readings from Last 3 Encounters:  05/08/22 100 lb (45.4 kg)  03/22/22 100 lb 9.6 oz  (45.6 kg)  02/20/22 103 lb 6.4 oz (46.9 kg)     GEN: Well nourished, well developed in no acute distress.  Elderly. HEENT: Normal NECK: No JVD; No carotid bruits LYMPHATICS: No lymphadenopathy CARDIAC: RRR, no murmurs, rubs, gallops RESPIRATORY:  Clear to auscultation without rales, wheezing or rhonchi  ABDOMEN: Soft, non-tender, non-distended MUSCULOSKELETAL: Trivial + bilateral LE edema to the ankles; No deformity  SKIN: Warm and dry NEUROLOGIC:  Alert and oriented x 3 PSYCHIATRIC:  Normal affect       ASSESSMENT:    1. Acute stroke due to ischemia ([place])   2. Chronic diastolic heart failure ([place])   3. Essential hypertension    PLAN:    In order of problems listed above:  #Cryptogenic stroke No AF detected thus far. Discussed the  role of loop recorder monitoring and she would like to proceed.    Loop recorder implant procedure discussed in detail including the risks and monthly monitoring costs and she wishes to proceed.   #HFpEF Warm and relatively euvolemic. Continue current medical therapy.  #HTN Controlled today.   Medication Adjustments/Labs and Tests Ordered: Current medicines are reviewed at length with the patient today.  Concerns regarding medicines are outlined above.  No orders of the defined types were placed in this encounter.  No orders of the defined types were placed in this encounter.   I,Mathew Stumpf,acting as a Education administrator for Vickie Epley, MD.,have documented all relevant documentation on the behalf of Vickie Epley, MD,as directed by  Vickie Epley, MD while in the presence of Vickie Epley, MD.  I, Vickie Epley, MD, have reviewed all documentation for this visit. The documentation on 05/08/22 for the exam, diagnosis, procedures, and orders are all accurate and complete.   Signed, Hilton Cork. Quentin Ore, MD, [facility], [facility] 05/08/2022 9:13 AM    Electrophysiology [facility]  -----------  SURGEON:   Vickie Epley, MD     PREPROCEDURE DIAGNOSIS:  Cryptogenic stroke    POSTPROCEDURE DIAGNOSIS: Cryptogenic stroke     PROCEDURES:   1. Implantable loop recorder implantation    INTRODUCTION:  GAVINA DILDINE presents with a history of cryptogenic stroke The costs of loop recorder monitoring have been discussed with the patient.    DESCRIPTION OF PROCEDURE:  Informed written consent was obtained.  The patient required no sedation for the procedure today.  Mapping over the patient's chest was performed to identify the area where electrograms were most prominent for ILR recording.  This area was found to be the left parasternal region over the 4th intercostal space. The patients left chest was therefore prepped and draped in the usual sterile fashion. The skin overlying the left parasternal region was infiltrated with lidocaine for local analgesia.  A 0.5-cm incision was made over the left parasternal region over the 3rd intercostal space.  A subcutaneous ILR pocket was fashioned using a combination of sharp and blunt dissection.  A Abbott Assert IQ ([id number]) implantable loop recorder was then placed into the pocket  R waves were very prominent and measured >0.23m.  Steri- Strips and a sterile dressing were then applied.  There were no early apparent complications.     CONCLUSIONS:   1. Successful implantation of a implantable loop recorder for Cryptogenic stroke  2. No early apparent complications.   CLysbeth GalasT. LQuentin Ore MD, [facility] Health[facility] Electrophysiology

## 2022-05-30 NOTE — Progress Notes (Unsigned)
[facility] [address] [place]. [place] [address] [phone]       Mackenzie Madden NOTE  [name] Date of Birth:  09/15/1928 Medical Record Number:  [id number]   Reason for Referral:  hospital stroke follow up    SUBJECTIVE:   CHIEF COMPLAINT:  No chief complaint on file.   HPI:   Ms. Mackenzie Madden is a 88 y.o. female with history of hypertension, glaucoma, pubic fracture (3 years ago) SIADH, recurrent UTIs, recent left wrist fracture and fall who presented on 03/01/2022 with confusion, nonsensical speech, and 2 weeks of vertigo.  Personally reviewed hospitalization pertinent progress notes, lab work and imaging.  Evaluated by Dr. Erlinda Hong for scattered right insular cortex, MCA and left frontal small infarcts likely secondary to unclear embolic source concerning for occult A-fib.  CTA head/neck negative LVO.  EF 40 to 45%.  LE Doppler negative DVT.  Recommended 30-day cardiac event monitor and if negative consider ILR.  LDL 88.  A1c 4.7.  Recommended DAPT for 3 weeks and aspirin alone and continuation of Crestor 10 mg daily.  Also treated for UTI on Zosyn.  PT/OT recommended home health and discharged home in stable condition.   Today, 05/31/2022, patient is being seen for initial hospital follow-up.    Completed 2-week cardiac monitor which was negative for A-fib, proceeded with ILR placement by Dr. Quentin Ore on 8/7.       PERTINENT IMAGING  Per hospitalization 03/01/2022 Code Stroke CT head No acute abnormality. Small vessel disease. Atrophy. ASPECTS 10.    CTA head & neck- negative for LVO MRI- right frontal white matter and right parietal white matter. Small area increased signal in the right insula. Mild hyperintensity on diffusion weighted imaging in the left frontal white matter not present on the prior MRI. 2D Echo- results EF 40-45% Vas Lower Extremity Duplex no DVT  LDL 88 HgbA1c 4.7    ROS:   14 system review of  systems performed and negative with exception of ***  PMH:  Past Medical History:  Diagnosis Date   Chronic diastolic heart failure ([place])    Essential hypertension 02/21/2015   Glaucoma    HOH (hard of hearing)    no hearing aids   Hypothyroidism    SIADH (syndrome of inappropriate ADH production) ([place]) 03/01/2022    PSH:  Past Surgical History:  Procedure Laterality Date   ABDOMINAL HYSTERECTOMY  2010   CHOLECYSTECTOMY  2000   CLOSED REDUCTION WRIST FRACTURE Left 01/24/2022   Procedure: CLOSED REDUCTION WRIST;  Surgeon: Orene Desanctis, MD;  Location: [place];  Service: Orthopedics;  Laterality: Left;   HIP PINNING,CANNULATED Right 08/05/2019   Procedure: CANNULATED HIP PINNING;  Surgeon: Mcarthur Rossetti, MD;  [facility];  Service: Orthopedics;  Laterality: Right;   I & D EXTREMITY Left 01/24/2022   Procedure: IRRIGATION AND DEBRIDEMENT EXTREMITY;LEFT distal radius open reduction internal fixation, INTRA- articular, 3 fragments;  Surgeon: Orene Desanctis, MD;  Location: [place];  Service: Orthopedics;  Laterality: Left;   ORIF WRIST FRACTURE Left 01/24/2022   Procedure: OPEN REDUCTION INTERNA FIXATION OF LEFT WRIST FRACTURE; LEFT wrist radiographs four views with intraoperative interpretation;  Surgeon: Orene Desanctis, MD;  Location: [place];  Service: Orthopedics;  Laterality: Left;   SPLENECTOMY, TOTAL      Social History:  Social History   Socioeconomic History   Marital status: Married    Spouse name: Not on file   Number of children: 4  Years of education: Not on file   Highest education level: Not on file  Occupational History   Occupation: retired  Tobacco Use   Smoking status: Never   Smokeless tobacco: Never  Substance and Sexual Activity   Alcohol use: No   Drug use: No   Sexual activity: Not on file  Other Topics Concern   Not on file  Social History Narrative   The patient is retired and married she has 4 children and grandchildren and great-grandchildren    No alcohol tobacco   Social Determinants of Radio broadcast assistant Strain: Not on file  Food Insecurity: Not on file  Transportation Needs: Not on file  Physical Activity: Not on file  Stress: Not on file  Social Connections: Not on file  Intimate Partner Violence: Not on file    Family History:  Family History  Problem Relation Age of Onset   CAD Mother    Kidney cancer Mother    Heart attack Father    Colon cancer Neg Hx    Esophageal cancer Neg Hx    Stomach cancer Neg Hx    Rectal cancer Neg Hx     Medications:   Current Outpatient Medications on File Prior to Visit  Medication Sig Dispense Refill   acetaminophen (TYLENOL) 325 MG tablet Take 2 tablets (650 mg total) by mouth every 6 (six) hours as needed for mild pain (or Fever >/= 101).     amLODipine (NORVASC) 5 MG tablet Take 1 tablet (5 mg total) by mouth daily. 30 tablet 11   aspirin EC 81 MG tablet Take 1 tablet (81 mg total) by mouth daily. Swallow whole. 30 tablet 12   Calcium 250 MG CAPS Take 250 mg by mouth daily.     clopidogrel (PLAVIX) 75 MG tablet Take 1 tablet (75 mg total) by mouth daily. 20 tablet 0   dorzolamide-timolol (COSOPT) 22.3-6.8 MG/ML ophthalmic solution Place 1 drop into both eyes 2 (two) times daily.     latanoprost (XALATAN) 0.005 % ophthalmic solution Place 1 drop into both eyes at bedtime.  11   levothyroxine (SYNTHROID) 88 MCG tablet Take 1 tablet (88 mcg total) by mouth daily before breakfast. 30 tablet 0   Multiple Vitamin (MULTIVITAMIN WITH MINERALS) TABS tablet Take 1 tablet by mouth daily.     rosuvastatin (CRESTOR) 10 MG tablet Take 1 tablet (10 mg total) by mouth daily. 30 tablet 11   No current facility-administered medications on file prior to visit.    Allergies:  No Known Allergies    OBJECTIVE:  Physical Exam  There were no vitals filed for this visit. There is no height or weight on file to calculate BMI. No results found.     02/20/2022   11:51 AM   Depression screen PHQ 2/9  Decreased Interest 2  Down, Depressed, Hopeless 0  PHQ - 2 Score 2  Altered sleeping 0  Tired, decreased energy 1  Change in appetite 0  Feeling bad or failure about yourself  0  Trouble concentrating 0  Moving slowly or fidgety/restless 1  Suicidal thoughts 0  PHQ-9 Score 4  Difficult doing work/chores Somewhat difficult     General: well developed, well nourished, seated, in no evident distress Head: head normocephalic and atraumatic.   Neck: supple with no carotid or supraclavicular bruits Cardiovascular: regular rate and rhythm, no murmurs Musculoskeletal: no deformity Skin:  no rash/petichiae Vascular:  Normal pulses all extremities   Neurologic Exam Mental Status: Awake and  fully alert. Oriented to place and time. Recent and remote memory intact. Attention span, concentration and fund of knowledge appropriate. Mood and affect appropriate.  Cranial Nerves: Fundoscopic exam reveals sharp disc margins. Pupils equal, briskly reactive to light. Extraocular movements full without nystagmus. Visual fields full to confrontation. Hearing intact. Facial sensation intact. Face, tongue, palate moves normally and symmetrically.  Motor: Normal bulk and tone. Normal strength in all tested extremity muscles Sensory.: intact to touch , pinprick , position and vibratory sensation.  Coordination: Rapid alternating movements normal in all extremities. Finger-to-nose and heel-to-shin performed accurately bilaterally. Gait and Station: Arises from chair without difficulty. Stance is normal. Gait demonstrates normal stride length and balance with ***. Tandem walk and heel toe ***.  Reflexes: 1+ and symmetric. Toes downgoing.     NIHSS  *** Modified Rankin  ***      ASSESSMENT: Mackenzie Madden is a 88 y.o. year old female with scattered right insular cortex, MCA and left frontal small infarcts on 0/98/1191 likely embolic secondary to unclear source. Vascular  risk factors include cardiomyopathy, HTN, HLD and advanced age.      PLAN:  Cryptogenic stroke:  Residual deficit: ***.  S/p ILR 05/2022 Continue {anticoagulants:31417}  and ***  for secondary stroke prevention.   Discussed secondary stroke prevention measures and importance of close PCP follow up for aggressive stroke risk factor management including BP goal<130/90, HLD with LDL goal<70 and DM with A1c.<7 .  Stroke labs 01/2022: LDL 88, A1c 4.7 I have gone over the pathophysiology of stroke, warning signs and symptoms, risk factors and their management in some detail with instructions to go to the closest emergency room for symptoms of concern.     Follow up in *** or call earlier if needed   CC:  [facility] provider: Dr. Leonie Man PCP: Lavone Orn, MD    I spent *** minutes of face-to-face and non-face-to-face time with patient.  This included previsit chart review including review of recent hospitalization, lab review, study review, order entry, electronic health record documentation, patient education regarding recent stroke including etiology, secondary stroke prevention measures and importance of managing stroke risk factors, residual deficits and typical recovery time and answered all other questions to patient satisfaction   Frann Rider, AGNP-BC  [facility] [address] [place], [place] [address]  Phone [phone] Fax [phone] Note: This document was prepared with digital dictation and possible smart phrase technology. Any transcriptional errors that result from this process are unintentional.

## 2022-05-31 ENCOUNTER — Encounter: Payer: Self-pay | Admitting: Orthopaedic Surgery

## 2022-05-31 ENCOUNTER — Ambulatory Visit (INDEPENDENT_AMBULATORY_CARE_PROVIDER_SITE_OTHER): Payer: Medicare Other | Admitting: Orthopaedic Surgery

## 2022-05-31 ENCOUNTER — Ambulatory Visit (INDEPENDENT_AMBULATORY_CARE_PROVIDER_SITE_OTHER): Payer: Medicare Other

## 2022-05-31 ENCOUNTER — Ambulatory Visit (INDEPENDENT_AMBULATORY_CARE_PROVIDER_SITE_OTHER): Payer: Medicare Other | Admitting: Adult Health

## 2022-05-31 ENCOUNTER — Encounter: Payer: Self-pay | Admitting: Adult Health

## 2022-05-31 VITALS — BP 138/65 | HR 65 | Ht 60.0 in

## 2022-05-31 DIAGNOSIS — I639 Cerebral infarction, unspecified: Secondary | ICD-10-CM | POA: Diagnosis not present

## 2022-05-31 DIAGNOSIS — M25551 Pain in right hip: Secondary | ICD-10-CM

## 2022-05-31 DIAGNOSIS — Z09 Encounter for follow-up examination after completed treatment for conditions other than malignant neoplasm: Secondary | ICD-10-CM

## 2022-05-31 MED ORDER — LIDOCAINE HCL 1 % IJ SOLN
3.0000 mL | INTRAMUSCULAR | Status: AC | PRN
  Administered 2022-05-31: 3 mL

## 2022-05-31 MED ORDER — METHYLPREDNISOLONE ACETATE 40 MG/ML IJ SUSP
40.0000 mg | INTRAMUSCULAR | Status: AC | PRN
  Administered 2022-05-31: 40 mg via INTRA_ARTICULAR

## 2022-05-31 NOTE — Patient Instructions (Signed)
Continue working with therapies for hopeful ongoing recovery  Continue aspirin 81 mg daily  and Crestor  for secondary stroke prevention  Please stop plavix (clopidogrel) at this time as 3 weeks of both aspirin and plavix combination completed   Continue to follow up with PCP regarding cholesterol and blood pressure management  Maintain strict control of hypertension with blood pressure goal below 130/90 and cholesterol with LDL cholesterol (bad cholesterol) goal below 70 mg/dL.   Signs of a Stroke? Follow the BEFAST method:  Balance Watch for a sudden loss of balance, trouble with coordination or vertigo Eyes Is there a sudden loss of vision in one or both eyes? Or double vision?  Face: Ask the person to smile. Does one side of the face droop or is it numb?  Arms: Ask the person to raise both arms. Does one arm drift downward? Is there weakness or numbness of a leg? Speech: Ask the person to repeat a simple phrase. Does the speech sound slurred/strange? Is the person confused ? Time: If you observe any of these signs, call 911.        Thank you for coming to see [place] at [facility]. I hope we have been able to provide you high quality care today.  You may receive a patient satisfaction survey over the next few weeks. We would appreciate your feedback and comments so that we may continue to improve ourselves and the health of our patients.

## 2022-05-31 NOTE — Progress Notes (Signed)
HPI: Mackenzie Madden's 88 year old female comes in today status post fall the other week onto her right hip.  She just lost her balance.  She has a history of pelvic right hip subcapital fracture which required cannulated screws.  She is having pain with ambulation and proximal lateral side of the right hip.  No radicular symptoms down the leg.  Review of systems: See HPI otherwise negative.  Physical exam: General: Well-developed well-nourished thin frail female seated in wheelchair. Psych: Alert and oriented x3  Bilateral hips diminished range of motion both hips.  No pain with internal/external rotation of either hip.  Circumflexion of both hips causes no pain or discomfort.  Tenderness over the right hip trochanteric region.  Radiographs: AP pelvis lateral view of the right hip: Status post cannulated pinning right hip fracture overall near anatomic alignment.  There is no acute fractures or acute findings.  No hardware failure.  Bilateral hips well located.  Impression: Right hip pain  Plan given her tenderness over the trochanteric region recommended cortisone injection she was agreeable.  We will see her back if pain persist or becomes worse.  Questions were encouraged and answered.  Procedure Note  Patient: Mackenzie Madden             Date of Birth: 05/09/1928           MRN: [id number]             Visit Date: 05/31/2022  Procedures: Visit Diagnoses:  1. Pain of right hip     Large Joint Inj: R greater trochanter on 05/31/2022 12:15 PM Indications: pain Details: 22 G 1.5 in needle, lateral approach  Arthrogram: No  Medications: 3 mL lidocaine 1 %; 40 mg methylPREDNISolone acetate 40 MG/ML Outcome: tolerated well, no immediate complications Procedure, treatment alternatives, risks and benefits explained, specific risks discussed. Consent was given by the patient. Immediately prior to procedure a time out was called to verify the correct patient, procedure, equipment,  support staff and site/side marked as required. Patient was prepped and draped in the usual sterile fashion.

## 2022-06-09 ENCOUNTER — Ambulatory Visit (INDEPENDENT_AMBULATORY_CARE_PROVIDER_SITE_OTHER): Payer: Medicare Other

## 2022-06-09 DIAGNOSIS — I639 Cerebral infarction, unspecified: Secondary | ICD-10-CM

## 2022-06-13 LAB — CUP PACEART REMOTE DEVICE CHECK
Date Time Interrogation Session: 20230911131436
Implantable Pulse Generator Implant Date: 20230807
Pulse Gen Serial Number: 511011222

## 2022-06-23 NOTE — Progress Notes (Signed)
Merlin loop recorder 

## 2022-08-21 ENCOUNTER — Telehealth: Payer: Self-pay | Admitting: Cardiology

## 2022-08-21 NOTE — Telephone Encounter (Signed)
Follow Up:     Patient's daughter said patient received a letter, statng you  had not received her transmission.      1. Has your device fired? no  2. Is you device beeping? no  3. Are you experiencing draining or swelling at device site? no  4. Are you calling to see if we received your device transmission? - received letter sating no transmission received  5. Have you passed out?     Please route to [place]

## 2022-08-21 NOTE — Telephone Encounter (Signed)
LMOVM that we did not receive the transmission. Left device clinic number for daughter to call back.

## 2022-08-23 ENCOUNTER — Ambulatory Visit (INDEPENDENT_AMBULATORY_CARE_PROVIDER_SITE_OTHER): Payer: Medicare Other

## 2022-08-23 DIAGNOSIS — I639 Cerebral infarction, unspecified: Secondary | ICD-10-CM | POA: Diagnosis not present

## 2022-08-23 LAB — CUP PACEART REMOTE DEVICE CHECK
Date Time Interrogation Session: 20231122125941
Implantable Pulse Generator Implant Date: 20230807
Pulse Gen Serial Number: 511011222

## 2022-08-23 NOTE — Telephone Encounter (Signed)
I spoke with the patient daughter and she is going to call back in 30 minutes or so.

## 2022-08-23 NOTE — Telephone Encounter (Signed)
Transmission received.

## 2022-08-23 NOTE — Telephone Encounter (Signed)
Patient's daughter returning call. 

## 2022-09-14 NOTE — Progress Notes (Signed)
Carelink Summary Report / Loop Recorder 

## 2022-09-22 ENCOUNTER — Ambulatory Visit (INDEPENDENT_AMBULATORY_CARE_PROVIDER_SITE_OTHER): Payer: Medicare Other

## 2022-09-22 DIAGNOSIS — I639 Cerebral infarction, unspecified: Secondary | ICD-10-CM | POA: Diagnosis not present

## 2022-09-22 LAB — CUP PACEART REMOTE DEVICE CHECK
Date Time Interrogation Session: 20231222020256
Implantable Pulse Generator Implant Date: 20230807
Pulse Gen Serial Number: 511011222

## 2022-10-12 NOTE — Progress Notes (Signed)
Carelink Summary Report / Loop Recorder 

## 2022-10-23 ENCOUNTER — Ambulatory Visit: Payer: Medicare Other | Attending: Cardiology

## 2022-10-23 DIAGNOSIS — I639 Cerebral infarction, unspecified: Secondary | ICD-10-CM | POA: Diagnosis not present

## 2022-10-24 LAB — CUP PACEART REMOTE DEVICE CHECK
Date Time Interrogation Session: 20240122020028
Implantable Pulse Generator Implant Date: 20230807
Pulse Gen Serial Number: 511011222

## 2022-11-08 ENCOUNTER — Ambulatory Visit: Payer: Medicare Other | Admitting: Podiatry

## 2022-11-23 ENCOUNTER — Ambulatory Visit (INDEPENDENT_AMBULATORY_CARE_PROVIDER_SITE_OTHER): Payer: Medicare Other

## 2022-11-23 DIAGNOSIS — I639 Cerebral infarction, unspecified: Secondary | ICD-10-CM

## 2022-11-23 LAB — CUP PACEART REMOTE DEVICE CHECK
Date Time Interrogation Session: 20240222020028
Implantable Pulse Generator Implant Date: 20230807
Pulse Gen Serial Number: 511011222

## 2022-12-11 NOTE — Progress Notes (Addendum)
Merlin loop Recorder 

## 2022-12-18 NOTE — Progress Notes (Signed)
Carelink Summary Report / Loop Recorder 

## 2022-12-25 ENCOUNTER — Ambulatory Visit (INDEPENDENT_AMBULATORY_CARE_PROVIDER_SITE_OTHER): Payer: Medicare Other

## 2022-12-25 DIAGNOSIS — I639 Cerebral infarction, unspecified: Secondary | ICD-10-CM | POA: Diagnosis not present

## 2022-12-25 LAB — CUP PACEART REMOTE DEVICE CHECK
Date Time Interrogation Session: 20240324020131
Implantable Pulse Generator Implant Date: 20230807
Pulse Gen Serial Number: 511011222

## 2023-01-24 LAB — CUP PACEART REMOTE DEVICE CHECK
Date Time Interrogation Session: 20240424020038
Implantable Pulse Generator Implant Date: 20230807
Pulse Gen Serial Number: 511011222

## 2023-01-25 ENCOUNTER — Ambulatory Visit (INDEPENDENT_AMBULATORY_CARE_PROVIDER_SITE_OTHER): Payer: Medicare Other

## 2023-01-25 DIAGNOSIS — I639 Cerebral infarction, unspecified: Secondary | ICD-10-CM

## 2023-02-05 NOTE — Progress Notes (Signed)
[facility] LOOP RECORDER 

## 2023-02-20 NOTE — Progress Notes (Signed)
Carelink Summary Report / Loop Recorder 

## 2023-02-27 ENCOUNTER — Ambulatory Visit (INDEPENDENT_AMBULATORY_CARE_PROVIDER_SITE_OTHER): Payer: Medicare Other

## 2023-02-27 DIAGNOSIS — I639 Cerebral infarction, unspecified: Secondary | ICD-10-CM

## 2023-02-28 ENCOUNTER — Telehealth: Payer: Self-pay

## 2023-02-28 DIAGNOSIS — I639 Cerebral infarction, unspecified: Secondary | ICD-10-CM

## 2023-02-28 DIAGNOSIS — I4891 Unspecified atrial fibrillation: Secondary | ICD-10-CM

## 2023-02-28 LAB — CUP PACEART REMOTE DEVICE CHECK
Date Time Interrogation Session: 20240528020033
Implantable Pulse Generator Implant Date: 20230807
Pulse Gen Serial Number: 511011222

## 2023-02-28 NOTE — Telephone Encounter (Signed)
ILR summary report received. Battery status OK. Normal device function. No new symptom, tachy, brady, or pause episodes. 1 new AF episode 5/6 @ 02:08, duration 30sec, mean HR 79.  No OAC, ASA only.  Monthly summary reports and ROV/PRN 1 false pause, loss of contact with undersensing Route to triage for AF, no OAC.  No alert on except symptom, will change to CVA protocol  Episode reviewed with Dr. Lalla Brothers confirms AF, LVM for patient to call device clinic back, as patient needs to be informed of AF on loop and patient needs to see AF clinic

## 2023-03-05 NOTE — Telephone Encounter (Signed)
Spoke with the patient's daughter and informed her of results and need for patient to be seen by A FIB clinic. She is aware that they will be called to schedule an appointment.

## 2023-03-05 NOTE — Addendum Note (Signed)
Addended by: Frutoso Schatz on: 03/05/2023 12:20 PM   Modules accepted: Orders

## 2023-03-19 ENCOUNTER — Encounter (HOSPITAL_COMMUNITY): Payer: Self-pay | Admitting: Internal Medicine

## 2023-03-19 ENCOUNTER — Ambulatory Visit (HOSPITAL_COMMUNITY)
Admission: RE | Admit: 2023-03-19 | Discharge: 2023-03-19 | Disposition: A | Discharge status: Home / Self Care | Payer: Medicare Other | Source: Ambulatory Visit | Attending: Internal Medicine | Admitting: Internal Medicine

## 2023-03-19 VITALS — BP 126/78 | HR 65 | Ht 60.0 in | Wt 98.4 lb

## 2023-03-19 DIAGNOSIS — I480 Paroxysmal atrial fibrillation: Secondary | ICD-10-CM

## 2023-03-19 DIAGNOSIS — R9431 Abnormal electrocardiogram [ECG] [EKG]: Secondary | ICD-10-CM | POA: Diagnosis not present

## 2023-03-19 DIAGNOSIS — E222 Syndrome of inappropriate secretion of antidiuretic hormone: Secondary | ICD-10-CM | POA: Insufficient documentation

## 2023-03-19 DIAGNOSIS — D6869 Other thrombophilia: Secondary | ICD-10-CM

## 2023-03-19 DIAGNOSIS — Z8673 Personal history of transient ischemic attack (TIA), and cerebral infarction without residual deficits: Secondary | ICD-10-CM | POA: Diagnosis not present

## 2023-03-19 DIAGNOSIS — Z7901 Long term (current) use of anticoagulants: Secondary | ICD-10-CM | POA: Diagnosis not present

## 2023-03-19 DIAGNOSIS — I5032 Chronic diastolic (congestive) heart failure: Secondary | ICD-10-CM | POA: Diagnosis not present

## 2023-03-19 DIAGNOSIS — I447 Left bundle-branch block, unspecified: Secondary | ICD-10-CM | POA: Insufficient documentation

## 2023-03-19 DIAGNOSIS — I110 Hypertensive heart disease with heart failure: Secondary | ICD-10-CM | POA: Insufficient documentation

## 2023-03-19 MED ORDER — APIXABAN 2.5 MG PO TABS: 2.5000 mg | ORAL_TABLET | Freq: Two times a day (BID) | ORAL | 4 refills | Status: DC

## 2023-03-19 NOTE — Patient Instructions (Signed)
Stop aspirin ? ?Start Eliquis 2.5mg twice a day ?

## 2023-03-19 NOTE — Progress Notes (Signed)
Primary Care Physician: Kirby Funk, MD (Inactive) Primary Cardiologist: None Primary Electrophysiologist: Dr. Lalla Brothers Referring Physician: Dr. Jarome Matin is a 89 y.o. female with a history of cryptogenic stroke, hypothyroidism, SIADH, HTN, chronic diastolic heart failure, and paroxsymal atrial fibrillation who presents for consultation in the [facility]. History of cardiac monitor 03/22/22 showed no AF. Seen by Dr. Lalla Brothers on 05/08/22 and ILR placed due to cryptogenic stroke. ILR alert on 5/6 showing Afib with HR 79 confirmed by Dr. Lalla Brothers. Patient is on ASA. She has a CHADS2VASC score of 4.  On follow up today, she is currently in NSR. She cannot recall if she felt symptomatic on day of device alert. Her daughter is here with her today and has not noticed patient complaining of any symptoms. She feels well currently and able to do her daily activities without issue.   Today, she denies symptoms of palpitations, chest pain, shortness of breath, orthopnea, PND, lower extremity edema, dizziness, presyncope, syncope, snoring, daytime somnolence, bleeding, or neurologic sequela. The patient is tolerating medications without difficulties and is otherwise without complaint today.    she has a BMI of Body mass index is 19.22 kg/m.Marland Kitchen Filed Weights   03/19/23 1103  Weight: 44.6 kg    Family History  Problem Relation Age of Onset   CAD Mother    Kidney cancer Mother    Heart attack Father    Colon cancer Neg Hx    Esophageal cancer Neg Hx    Stomach cancer Neg Hx    Rectal cancer Neg Hx     Atrial Fibrillation Management history:  Previous antiarrhythmic drugs: None Previous cardioversions: None Previous ablations: None Anticoagulation history: None   Past Medical History:  Diagnosis Date   Chronic diastolic heart failure (HCC)    Essential hypertension 02/21/2015   Glaucoma    HOH (hard of hearing)    no hearing aids    Hypothyroidism    SIADH (syndrome of inappropriate ADH production) (HCC) 03/01/2022   Past Surgical History:  Procedure Laterality Date   ABDOMINAL HYSTERECTOMY  2010   CHOLECYSTECTOMY  2000   CLOSED REDUCTION WRIST FRACTURE Left 01/24/2022   Procedure: CLOSED REDUCTION WRIST;  Surgeon: Gomez Cleverly, MD;  Location: [facility];  Service: Orthopedics;  Laterality: Left;   HIP PINNING,CANNULATED Right 08/05/2019   Procedure: CANNULATED HIP PINNING;  Surgeon: Kathryne Hitch, MD;  Location: [facility];  Service: Orthopedics;  Laterality: Right;   I & D EXTREMITY Left 01/24/2022   Procedure: IRRIGATION AND DEBRIDEMENT EXTREMITY;LEFT distal radius open reduction internal fixation, INTRA- articular, 3 fragments;  Surgeon: Gomez Cleverly, MD;  Location: [facility] OR;  Service: Orthopedics;  Laterality: Left;   ORIF WRIST FRACTURE Left 01/24/2022   Procedure: OPEN REDUCTION INTERNA FIXATION OF LEFT WRIST FRACTURE; LEFT wrist radiographs four views with intraoperative interpretation;  Surgeon: Gomez Cleverly, MD;  Location: [facility] OR;  Service: Orthopedics;  Laterality: Left;   SPLENECTOMY, TOTAL      Current Outpatient Medications  Medication Sig Dispense Refill   acetaminophen (TYLENOL) 325 MG tablet Take 2 tablets (650 mg total) by mouth every 6 (six) hours as needed for mild pain (or Fever >/= 101).     amLODipine (NORVASC) 5 MG tablet Take 1 tablet (5 mg total) by mouth daily. 30 tablet 11   apixaban (ELIQUIS) 2.5 MG TABS tablet Take 1 tablet (2.5 mg total) by mouth 2 (two) times daily. 60 tablet 4   Calcium 250 MG  CAPS Take 250 mg by mouth daily.     CRANBERRY EXTRACT PO Take by mouth daily.     dorzolamide-timolol (COSOPT) 22.3-6.8 MG/ML ophthalmic solution Place 1 drop into both eyes 2 (two) times daily.     latanoprost (XALATAN) 0.005 % ophthalmic solution Place 1 drop into both eyes at bedtime.  11   levothyroxine (SYNTHROID) 88 MCG tablet Take 1 tablet (88 mcg total) by mouth daily before breakfast. 30  tablet 0   Multiple Vitamin (MULTIVITAMIN WITH MINERALS) TABS tablet Take 1 tablet by mouth daily.     rosuvastatin (CRESTOR) 10 MG tablet Take 1 tablet (10 mg total) by mouth daily. 30 tablet 11   No current facility-administered medications for this encounter.    No Known Allergies  Social History   Socioeconomic History   Marital status: Married    Spouse name: Not on file   Number of children: 4   Years of education: Not on file   Highest education level: Not on file  Occupational History   Occupation: retired  Tobacco Use   Smoking status: Never   Smokeless tobacco: Never  Vaping Use   Vaping Use: Never used  Substance and Sexual Activity   Alcohol use: No   Drug use: No   Sexual activity: Not on file  Other Topics Concern   Not on file  Social History Narrative   The patient is retired and married she has 4 children and grandchildren and great-grandchildren   No alcohol tobacco   Social Determinants of Corporate investment banker Strain: Not on file  Food Insecurity: Not on file  Transportation Needs: Not on file  Physical Activity: Not on file  Stress: Not on file  Social Connections: Not on file  Intimate Partner Violence: Not on file    ROS- All systems are reviewed and negative except as per the HPI above.  Physical Exam: Vitals:   03/19/23 1103  BP: 126/78  Pulse: 65  Weight: 44.6 kg  Height: 5' (1.524 m)    GEN- The patient is a well appearing female, alert and oriented x 3 today.   Head- normocephalic, atraumatic Eyes-  Sclera clear, conjunctiva pink Ears- hearing intact Oropharynx- clear Neck- supple  Lungs- Clear to ausculation bilaterally, normal work of breathing Heart- Regular rate and rhythm, no murmurs, rubs or gallops  GI- soft, NT, ND, + BS Extremities- no clubbing, cyanosis, or edema MS- no significant deformity or atrophy Skin- no rash or lesion Psych- euthymic mood, full affect Neuro- strength and sensation are intact  Wt  Readings from Last 3 Encounters:  03/19/23 44.6 kg  05/08/22 45.4 kg  03/22/22 45.6 kg    EKG today demonstrates  Vent. rate 65 BPM PR interval 196 ms QRS duration 142 ms QT/QTcB 398/413 ms P-R-T axes 64 58 237 Normal sinus rhythm Left bundle branch block Abnormal ECG When compared with ECG of 02-Mar-2022 11:46, PREVIOUS ECG IS PRESENT  Echo 03/02/22 demonstrated: 1. Left ventricular ejection fraction, by estimation, is 40 to 45%. The  left ventricle has mildly decreased function. The left ventricle  demonstrates global hypokinesis. There is mild concentric left ventricular  hypertrophy. Left ventricular diastolic  parameters are indeterminate.   2. Right ventricular systolic function is normal. The right ventricular  size is normal. There is normal pulmonary artery systolic pressure.   3. The mitral valve is normal in structure. Mild to moderate mitral valve  regurgitation. No evidence of mitral stenosis.   4. Tricuspid valve regurgitation  is moderate.   5. The aortic valve is normal in structure. Aortic valve regurgitation is  mild. Aortic valve sclerosis/calcification is present, without any  evidence of aortic stenosis. Aortic regurgitation PHT measures 691 msec.   6. The inferior vena cava is normal in size with greater than 50%  respiratory variability, suggesting right atrial pressure of 3 mmHg.   7. Agitated saline contrast bubble study was negative, with no evidence  of any interatrial shunt.   Epic records are reviewed at length today.  CHA2DS2-VASc Score = 6  The patient's score is based upon: CHF History: 0 HTN History: 1 Diabetes History: 0 Stroke History: 2 Vascular Disease History: 0 Age Score: 2 Gender Score: 1       ASSESSMENT AND PLAN: Paroxysmal Atrial Fibrillation (ICD10:  I48.0) The patient's CHA2DS2-VASc score is 6, indicating a 9.7% annual risk of stroke.    Education provided about Afib. Discussion about medication treatments versus rate  control depending on symptoms and burden. After discussion, we will proceed with conservative observation at this time.  Stop ASA. Begin Eliquis 2.5 mg BID. F/u in 1 month after next ILR check on 7/29.   Secondary Hypercoagulable State (ICD10:  D68.69) The patient is at significant risk for stroke/thromboembolism based upon her CHA2DS2-VASc Score of 6.  Start Apixaban (Eliquis).  After discussion of anticoagulation to prevent stroke, she will begin 2.5 mg BID. Recheck CBC in 1 month.    Follow up in 1 month Afib clinic to assess burden / symptoms.   Lake Bells, PA-C Afib Clinic [facility] [address] [place], [place] [address] [phone] 03/19/2023 11:41 AM

## 2023-03-26 NOTE — Progress Notes (Signed)
Carelink Summary Report / Loop Recorder 

## 2023-03-29 ENCOUNTER — Ambulatory Visit: Payer: Medicare Other

## 2023-03-29 DIAGNOSIS — I639 Cerebral infarction, unspecified: Secondary | ICD-10-CM

## 2023-04-03 LAB — CUP PACEART REMOTE DEVICE CHECK
Date Time Interrogation Session: 20240701140543
Implantable Pulse Generator Implant Date: 20230807
Pulse Gen Serial Number: 511011222

## 2023-04-12 ENCOUNTER — Encounter: Payer: Self-pay | Admitting: Podiatry

## 2023-04-12 ENCOUNTER — Ambulatory Visit: Payer: Medicare Other | Admitting: Podiatry

## 2023-04-12 DIAGNOSIS — M79675 Pain in left toe(s): Secondary | ICD-10-CM

## 2023-04-12 DIAGNOSIS — M79674 Pain in right toe(s): Secondary | ICD-10-CM | POA: Diagnosis not present

## 2023-04-12 DIAGNOSIS — M7751 Other enthesopathy of right foot: Secondary | ICD-10-CM

## 2023-04-12 DIAGNOSIS — B351 Tinea unguium: Secondary | ICD-10-CM

## 2023-04-12 DIAGNOSIS — M2041 Other hammer toe(s) (acquired), right foot: Secondary | ICD-10-CM | POA: Diagnosis not present

## 2023-04-12 NOTE — Progress Notes (Signed)
Subjective:   Patient ID: Mackenzie Madden, female   DOB: 89 y.o.   MRN: [id number]   HPI Patient presents with caregiver with a very painful fifth digit right fluid around the joint and severely thickened elongated nailbeds 1-5 both feet that become painful with shoe gear bilateral and are not able to be taken care of by family.  Patient does not smoke is not active   Review of Systems  All other systems reviewed and are negative.       Objective:  Physical Exam Vitals and nursing note reviewed.  Constitutional:      Appearance: She is well-developed.  Pulmonary:     Effort: Pulmonary effort is normal.  Musculoskeletal:        General: Normal range of motion.  Skin:    General: Skin is warm.  Neurological:     Mental Status: She is alert.     Neurovascular status found to be slightly reduced as far as DP PT pulses with patient found to have severe thickness of nailbeds hallux of the worst all nails have moderate involvement with inflammation of the fifth digit right fluid buildup around the inner phalangeal joint with keratotic lesion rotation of the toe     Assessment:  Inflammatory capsulitis fifth digit right with digital deformity and mycotic nail infection with pain 1-5 both feet     Plan:  H&P both conditions reviewed sterile prep injected the inner phalangeal joint right 2 mg dexamethasone Kenalog 5 mg Xylocaine and then debrided nailbeds 1-5 both feet no angiogenic bleeding reappoint routine care

## 2023-04-17 NOTE — Progress Notes (Signed)
 Carelink Summary Report / Loop Recorder 

## 2023-04-30 ENCOUNTER — Ambulatory Visit (INDEPENDENT_AMBULATORY_CARE_PROVIDER_SITE_OTHER): Payer: Medicare Other

## 2023-04-30 DIAGNOSIS — I639 Cerebral infarction, unspecified: Secondary | ICD-10-CM

## 2023-05-02 ENCOUNTER — Encounter (HOSPITAL_COMMUNITY): Payer: Self-pay | Admitting: Internal Medicine

## 2023-05-02 ENCOUNTER — Ambulatory Visit (HOSPITAL_COMMUNITY)
Admission: RE | Admit: 2023-05-02 | Discharge: 2023-05-02 | Disposition: A | Discharge status: Home / Self Care | Payer: Medicare Other | Source: Ambulatory Visit | Attending: Internal Medicine | Admitting: Internal Medicine

## 2023-05-02 VITALS — BP 110/74 | HR 59 | Ht 60.0 in | Wt 97.2 lb

## 2023-05-02 DIAGNOSIS — Z8673 Personal history of transient ischemic attack (TIA), and cerebral infarction without residual deficits: Secondary | ICD-10-CM | POA: Diagnosis not present

## 2023-05-02 DIAGNOSIS — E222 Syndrome of inappropriate secretion of antidiuretic hormone: Secondary | ICD-10-CM | POA: Diagnosis not present

## 2023-05-02 DIAGNOSIS — I447 Left bundle-branch block, unspecified: Secondary | ICD-10-CM | POA: Insufficient documentation

## 2023-05-02 DIAGNOSIS — I491 Atrial premature depolarization: Secondary | ICD-10-CM | POA: Insufficient documentation

## 2023-05-02 DIAGNOSIS — I5032 Chronic diastolic (congestive) heart failure: Secondary | ICD-10-CM | POA: Diagnosis not present

## 2023-05-02 DIAGNOSIS — D6869 Other thrombophilia: Secondary | ICD-10-CM | POA: Diagnosis not present

## 2023-05-02 DIAGNOSIS — E039 Hypothyroidism, unspecified: Secondary | ICD-10-CM | POA: Diagnosis not present

## 2023-05-02 DIAGNOSIS — Z7901 Long term (current) use of anticoagulants: Secondary | ICD-10-CM | POA: Insufficient documentation

## 2023-05-02 DIAGNOSIS — Z7982 Long term (current) use of aspirin: Secondary | ICD-10-CM | POA: Diagnosis not present

## 2023-05-02 DIAGNOSIS — Z79890 Hormone replacement therapy (postmenopausal): Secondary | ICD-10-CM | POA: Diagnosis not present

## 2023-05-02 DIAGNOSIS — R001 Bradycardia, unspecified: Secondary | ICD-10-CM | POA: Diagnosis not present

## 2023-05-02 DIAGNOSIS — I110 Hypertensive heart disease with heart failure: Secondary | ICD-10-CM | POA: Insufficient documentation

## 2023-05-02 DIAGNOSIS — I480 Paroxysmal atrial fibrillation: Secondary | ICD-10-CM | POA: Diagnosis not present

## 2023-05-02 LAB — CBC
HCT: 40.8 % (ref 36.0–46.0)
Hemoglobin: 13.6 g/dL (ref 12.0–15.0)
MCH: 32.8 pg (ref 26.0–34.0)
MCHC: 33.3 g/dL (ref 30.0–36.0)
MCV: 98.3 fL (ref 80.0–100.0)
Platelets: 146 10*3/uL — ABNORMAL LOW (ref 150–400)
RBC: 4.15 MIL/uL (ref 3.87–5.11)
RDW: 13.5 % (ref 11.5–15.5)
WBC: 8.3 10*3/uL (ref 4.0–10.5)
nRBC: 0 % (ref 0.0–0.2)

## 2023-05-02 NOTE — Progress Notes (Signed)
Primary Care Physician: Mackenzie Ishihara, MD Primary Cardiologist: None Primary Electrophysiologist: Dr. Lalla Madden Referring Physician: Dr. Jarome Madden is a 89 y.o. female with a history of cryptogenic stroke, hypothyroidism, SIADH, HTN, chronic diastolic heart failure, and paroxsymal atrial fibrillation who presents for consultation in the [facility]. History of cardiac monitor 03/22/22 showed no AF. Seen by Dr. Lalla Madden on 05/08/22 and ILR placed due to cryptogenic stroke. ILR alert on 5/6 showing Afib with HR 79 confirmed by Dr. Lalla Madden. Patient is on ASA. She has a CHADS2VASC score of 4.  On follow up today, she is currently in NSR. She cannot recall if she felt symptomatic on day of device alert. Her daughter is here with her today and has not noticed patient complaining of any symptoms. She feels well currently and able to do her daily activities without issue.   On follow up 05/02/23, she is currently in NSR. ILR device review yesterday 7/30 showed no new Afib episodes. She does not have bleeding issues on Eliquis.   Today, she denies symptoms of palpitations, chest pain, shortness of breath, orthopnea, PND, lower extremity edema, dizziness, presyncope, syncope, snoring, daytime somnolence, bleeding, or neurologic sequela. The patient is tolerating medications without difficulties and is otherwise without complaint today.    she has a BMI of Body mass index is 18.98 kg/m.Marland Kitchen Filed Weights   05/02/23 1055  Weight: 44.1 kg     Family History  Problem Relation Age of Onset   CAD Mother    Kidney cancer Mother    Heart attack Father    Colon cancer Neg Hx    Esophageal cancer Neg Hx    Stomach cancer Neg Hx    Rectal cancer Neg Hx     Atrial Fibrillation Management history:  Previous antiarrhythmic drugs: None Previous cardioversions: None Previous ablations: None Anticoagulation history: Eliquis 2.5 mg BID   Past Medical  History:  Diagnosis Date   Chronic diastolic heart failure (HCC)    Essential hypertension 02/21/2015   Glaucoma    HOH (hard of hearing)    no hearing aids   Hypothyroidism    SIADH (syndrome of inappropriate ADH production) (HCC) 03/01/2022   Past Surgical History:  Procedure Laterality Date   ABDOMINAL HYSTERECTOMY  2010   CHOLECYSTECTOMY  2000   CLOSED REDUCTION WRIST FRACTURE Left 01/24/2022   Procedure: CLOSED REDUCTION WRIST;  Surgeon: Gomez Cleverly, MD;  Location: [facility] OR;  Service: Orthopedics;  Laterality: Left;   HIP PINNING,CANNULATED Right 08/05/2019   Procedure: CANNULATED HIP PINNING;  Surgeon: Kathryne Hitch, MD;  Location: [facility];  Service: Orthopedics;  Laterality: Right;   I & D EXTREMITY Left 01/24/2022   Procedure: IRRIGATION AND DEBRIDEMENT EXTREMITY;LEFT distal radius open reduction internal fixation, INTRA- articular, 3 fragments;  Surgeon: Gomez Cleverly, MD;  Location: [facility] OR;  Service: Orthopedics;  Laterality: Left;   ORIF WRIST FRACTURE Left 01/24/2022   Procedure: OPEN REDUCTION INTERNA FIXATION OF LEFT WRIST FRACTURE; LEFT wrist radiographs four views with intraoperative interpretation;  Surgeon: Gomez Cleverly, MD;  Location: [facility] OR;  Service: Orthopedics;  Laterality: Left;   SPLENECTOMY, TOTAL      Current Outpatient Medications  Medication Sig Dispense Refill   acetaminophen (TYLENOL) 325 MG tablet Take 2 tablets (650 mg total) by mouth every 6 (six) hours as needed for mild pain (or Fever >/= 101).     amLODipine (NORVASC) 5 MG tablet Take 1 tablet (5 mg total) by mouth  daily. 30 tablet 11   apixaban (ELIQUIS) 2.5 MG TABS tablet Take 1 tablet (2.5 mg total) by mouth 2 (two) times daily. 60 tablet 4   Calcium 250 MG CAPS Take 250 mg by mouth daily.     CRANBERRY EXTRACT PO Take by mouth daily.     dorzolamide-timolol (COSOPT) 22.3-6.8 MG/ML ophthalmic solution Place 1 drop into both eyes 2 (two) times daily.     latanoprost (XALATAN) 0.005 % ophthalmic  solution Place 1 drop into both eyes at bedtime.  11   levothyroxine (SYNTHROID) 88 MCG tablet Take 1 tablet (88 mcg total) by mouth daily before breakfast. 30 tablet 0   Multiple Vitamin (MULTIVITAMIN WITH MINERALS) TABS tablet Take 1 tablet by mouth daily.     rosuvastatin (CRESTOR) 10 MG tablet Take 1 tablet (10 mg total) by mouth daily. 30 tablet 11   No current facility-administered medications for this encounter.    No Known Allergies  ROS- All systems are reviewed and negative except as per the HPI above.  Physical Exam: Vitals:   05/02/23 1055  BP: 110/74  Pulse: (!) 59  Weight: 44.1 kg  Height: 5' (1.524 m)    GEN- The patient is well appearing, alert and oriented x 3 today.   Neck - no JVD or carotid bruit noted Lungs- Clear to ausculation bilaterally, normal work of breathing Heart- Regular rate and rhythm, no murmurs, rubs or gallops, PMI not laterally displaced Extremities- no clubbing, cyanosis, or edema Skin - no rash or ecchymosis noted   Wt Readings from Last 3 Encounters:  05/02/23 44.1 kg  03/19/23 44.6 kg  05/08/22 45.4 kg    EKG today demonstrates  Vent. rate 59 BPM PR interval 206 ms QRS duration 152 ms QT/QTcB 442/437 ms P-R-T axes 75 21 188 Sinus bradycardia with Premature atrial complexes Left bundle branch block Abnormal ECG When compared with ECG of 19-Mar-2023 11:13, PREVIOUS ECG IS PRESENT  Echo 03/02/22 demonstrated: 1. Left ventricular ejection fraction, by estimation, is 40 to 45%. The  left ventricle has mildly decreased function. The left ventricle  demonstrates global hypokinesis. There is mild concentric left ventricular  hypertrophy. Left ventricular diastolic  parameters are indeterminate.   2. Right ventricular systolic function is normal. The right ventricular  size is normal. There is normal pulmonary artery systolic pressure.   3. The mitral valve is normal in structure. Mild to moderate mitral valve  regurgitation. No  evidence of mitral stenosis.   4. Tricuspid valve regurgitation is moderate.   5. The aortic valve is normal in structure. Aortic valve regurgitation is  mild. Aortic valve sclerosis/calcification is present, without any  evidence of aortic stenosis. Aortic regurgitation PHT measures 691 msec.   6. The inferior vena cava is normal in size with greater than 50%  respiratory variability, suggesting right atrial pressure of 3 mmHg.   7. Agitated saline contrast bubble study was negative, with no evidence  of any interatrial shunt.   Epic records are reviewed at length today.  CHA2DS2-VASc Score = 6  The patient's score is based upon: CHF History: 0 HTN History: 1 Diabetes History: 0 Stroke History: 2 Vascular Disease History: 0 Age Score: 2 Gender Score: 1      ASSESSMENT AND PLAN: Paroxysmal Atrial Fibrillation (ICD10:  I48.0) The patient's CHA2DS2-VASc score is 6, indicating a 9.7% annual risk of stroke.    She is currently in NSR.  After discussion with patient and daughter, we will continue with conservative observation via  ILR device checks.    Secondary Hypercoagulable State (ICD10:  D68.69) The patient is at significant risk for stroke/thromboembolism based upon her CHA2DS2-VASc Score of 6.  Continue Eliquis 2.5 mg BID. No missed doses.  CBC drawn today.     Follow up 6 months Afib clinic.    Mackenzie Bells, PA-C Afib Clinic [facility] [address] [place], [place] [address] [phone] 05/02/2023 11:25 AM

## 2023-05-16 NOTE — Progress Notes (Signed)
Merlin Loop Recorder  

## 2023-05-31 ENCOUNTER — Ambulatory Visit (INDEPENDENT_AMBULATORY_CARE_PROVIDER_SITE_OTHER): Payer: Medicare Other

## 2023-05-31 DIAGNOSIS — I639 Cerebral infarction, unspecified: Secondary | ICD-10-CM | POA: Diagnosis not present

## 2023-05-31 LAB — CUP PACEART REMOTE DEVICE CHECK
Date Time Interrogation Session: 20240829020141
Implantable Pulse Generator Implant Date: 20230807
Pulse Gen Serial Number: 511011222

## 2023-06-06 ENCOUNTER — Other Ambulatory Visit: Payer: Self-pay | Admitting: Physical Medicine and Rehabilitation

## 2023-06-07 NOTE — Progress Notes (Signed)
Carelink Summary Report / Loop Recorder 

## 2023-07-02 ENCOUNTER — Ambulatory Visit: Payer: Medicare Other

## 2023-07-02 LAB — CUP PACEART REMOTE DEVICE CHECK
Date Time Interrogation Session: 20240929020036
Implantable Pulse Generator Implant Date: 20230807
Pulse Gen Serial Number: 511011222

## 2023-08-02 ENCOUNTER — Ambulatory Visit (INDEPENDENT_AMBULATORY_CARE_PROVIDER_SITE_OTHER): Payer: Medicare Other

## 2023-08-02 DIAGNOSIS — I639 Cerebral infarction, unspecified: Secondary | ICD-10-CM | POA: Diagnosis not present

## 2023-08-02 LAB — CUP PACEART REMOTE DEVICE CHECK
Date Time Interrogation Session: 20241031020103
Implantable Pulse Generator Implant Date: 20230807
Pulse Gen Serial Number: 511011222

## 2023-08-15 ENCOUNTER — Other Ambulatory Visit (HOSPITAL_COMMUNITY): Payer: Self-pay | Admitting: Internal Medicine

## 2023-08-15 NOTE — Progress Notes (Signed)
Carelink Summary Report / Loop Recorder 

## 2023-09-02 LAB — CUP PACEART REMOTE DEVICE CHECK
Date Time Interrogation Session: 20241201020210
Implantable Pulse Generator Implant Date: 20230807
Pulse Gen Serial Number: 511011222

## 2023-09-03 ENCOUNTER — Ambulatory Visit (INDEPENDENT_AMBULATORY_CARE_PROVIDER_SITE_OTHER): Payer: Medicare Other

## 2023-09-03 DIAGNOSIS — I639 Cerebral infarction, unspecified: Secondary | ICD-10-CM

## 2023-09-03 DIAGNOSIS — I480 Paroxysmal atrial fibrillation: Secondary | ICD-10-CM

## 2023-10-08 ENCOUNTER — Ambulatory Visit: Payer: Medicare Other

## 2023-10-08 DIAGNOSIS — I639 Cerebral infarction, unspecified: Secondary | ICD-10-CM

## 2023-10-08 LAB — CUP PACEART REMOTE DEVICE CHECK
Date Time Interrogation Session: 20250106020030
Implantable Pulse Generator Implant Date: 20230807
Pulse Gen Serial Number: 511011222

## 2023-10-16 ENCOUNTER — Ambulatory Visit (HOSPITAL_COMMUNITY)
Admission: RE | Admit: 2023-10-16 | Discharge: 2023-10-16 | Disposition: A | Discharge status: Home / Self Care | Payer: Medicare Other | Source: Ambulatory Visit | Attending: Internal Medicine | Admitting: Internal Medicine

## 2023-10-16 VITALS — BP 148/70 | HR 57 | Ht 60.0 in | Wt 100.2 lb

## 2023-10-16 DIAGNOSIS — I5032 Chronic diastolic (congestive) heart failure: Secondary | ICD-10-CM | POA: Insufficient documentation

## 2023-10-16 DIAGNOSIS — I480 Paroxysmal atrial fibrillation: Secondary | ICD-10-CM | POA: Diagnosis not present

## 2023-10-16 DIAGNOSIS — Z7901 Long term (current) use of anticoagulants: Secondary | ICD-10-CM | POA: Diagnosis not present

## 2023-10-16 DIAGNOSIS — I4891 Unspecified atrial fibrillation: Secondary | ICD-10-CM | POA: Diagnosis not present

## 2023-10-16 DIAGNOSIS — D6869 Other thrombophilia: Secondary | ICD-10-CM | POA: Diagnosis not present

## 2023-10-16 NOTE — Progress Notes (Signed)
 Primary Care Physician: Elliot Charm, MD Primary Cardiologist: None Primary Electrophysiologist: Dr. Cindie Referring Physician: Dr. Cindie Mervyn [name] is a 89 y.o. female with a history of cryptogenic stroke, hypothyroidism, SIADH, HTN, chronic diastolic heart failure, and paroxsymal atrial fibrillation who presents for consultation in the [facility]. History of cardiac monitor 03/22/22 showed no AF. Seen by Dr. Cindie on 05/08/22 and ILR placed due to cryptogenic stroke. ILR alert on 5/6 showing Afib with HR 79 confirmed by Dr. Cindie. Patient is on ASA. She has a CHADS2VASC score of 4.  On follow up today, she is currently in NSR. She cannot recall if she felt symptomatic on day of device alert. Her daughter is here with her today and has not noticed patient complaining of any symptoms. She feels well currently and able to do her daily activities without issue.   On follow up 05/02/23, she is currently in NSR. ILR device review yesterday 7/30 showed no new Afib episodes. She does not have bleeding issues on Eliquis .   On follow up 10/16/23, she is currently in NSR. ILR device review on 10/08/23 showed no arrhythmias. No bleeding issues on Eliquis  2.5 mg BID.   Today, she denies symptoms of palpitations, chest pain, shortness of breath, orthopnea, PND, lower extremity edema, dizziness, presyncope, syncope, snoring, daytime somnolence, bleeding, or neurologic sequela. The patient is tolerating medications without difficulties and is otherwise without complaint today.    she has a BMI of Body mass index is 19.57 kg/m.SABRA Filed Weights   10/16/23 1052  Weight: 45.5 kg      Family History  Problem Relation Age of Onset   CAD Mother    Kidney cancer Mother    Heart attack Father    Colon cancer Neg Hx    Esophageal cancer Neg Hx    Stomach cancer Neg Hx    Rectal cancer Neg Hx     Atrial Fibrillation Management history:  Previous  antiarrhythmic drugs: None Previous cardioversions: None Previous ablations: None Anticoagulation history: Eliquis  2.5 mg BID   Past Medical History:  Diagnosis Date   Chronic diastolic heart failure (HCC)    Essential hypertension 02/21/2015   Glaucoma    HOH (hard of hearing)    no hearing aids   Hypothyroidism    SIADH (syndrome of inappropriate ADH production) (HCC) 03/01/2022   Past Surgical History:  Procedure Laterality Date   ABDOMINAL HYSTERECTOMY  2010   CHOLECYSTECTOMY  2000   CLOSED REDUCTION WRIST FRACTURE Left 01/24/2022   Procedure: CLOSED REDUCTION WRIST;  Surgeon: Alyse Agent, MD;  Location: [facility] OR;  Service: Orthopedics;  Laterality: Left;   HIP PINNING,CANNULATED Right 08/05/2019   Procedure: CANNULATED HIP PINNING;  Surgeon: Vernetta Lonni GRADE, MD;  Location: [facility] ORS;  Service: Orthopedics;  Laterality: Right;   I & D EXTREMITY Left 01/24/2022   Procedure: IRRIGATION AND DEBRIDEMENT EXTREMITY;LEFT distal radius open reduction internal fixation, INTRA- articular, 3 fragments;  Surgeon: Alyse Agent, MD;  Location: [facility] OR;  Service: Orthopedics;  Laterality: Left;   ORIF WRIST FRACTURE Left 01/24/2022   Procedure: OPEN REDUCTION INTERNA FIXATION OF LEFT WRIST FRACTURE; LEFT wrist radiographs four views with intraoperative interpretation;  Surgeon: Alyse Agent, MD;  Location: [facility] OR;  Service: Orthopedics;  Laterality: Left;   SPLENECTOMY, TOTAL      Current Outpatient Medications  Medication Sig Dispense Refill   acetaminophen  (TYLENOL ) 325 MG tablet Take 2 tablets (650 mg total) by mouth every 6 (  six) hours as needed for mild pain (or Fever >/= 101). (Patient taking differently: Take 325 mg by mouth as needed for mild pain (pain score 1-3) (or Fever >/= 101).)     amLODipine  (NORVASC ) 5 MG tablet Take 1 tablet (5 mg total) by mouth daily. 30 tablet 11   Calcium  250 MG CAPS Take 250 mg by mouth daily.     CRANBERRY EXTRACT PO Take 1 tablet by mouth daily.      dorzolamide -timolol  (COSOPT ) 22.3-6.8 MG/ML ophthalmic solution Place 1 drop into both eyes 2 (two) times daily.     ELIQUIS  2.5 MG TABS tablet TAKE 1 TABLET BY MOUTH TWICE A DAY 60 tablet 4   latanoprost  (XALATAN ) 0.005 % ophthalmic solution Place 1 drop into both eyes at bedtime.  11   levothyroxine  (SYNTHROID ) 88 MCG tablet Take 1 tablet (88 mcg total) by mouth daily before breakfast. 30 tablet 0   Multiple Vitamin (MULTIVITAMIN WITH MINERALS) TABS tablet Take 1 tablet by mouth daily.     rosuvastatin  (CRESTOR ) 10 MG tablet Take 1 tablet (10 mg total) by mouth daily. 30 tablet 11   No current facility-administered medications for this encounter.    No Known Allergies  ROS- All systems are reviewed and negative except as per the HPI above.  Physical Exam: Vitals:   10/16/23 1052  BP: (!) 148/70  Pulse: (!) 57  Weight: 45.5 kg  Height: 5' (1.524 m)    GEN- The patient is well appearing, alert and oriented x 3 today.   Neck - no JVD or carotid bruit noted Lungs- Clear to ausculation bilaterally, normal work of breathing Heart- Regular rate and rhythm, no murmurs, rubs or gallops, PMI not laterally displaced Extremities- no clubbing, cyanosis, or edema Skin - no rash or ecchymosis noted   Wt Readings from Last 3 Encounters:  10/16/23 45.5 kg  05/02/23 44.1 kg  03/19/23 44.6 kg    EKG today demonstrates  Vent. rate 57 BPM PR interval 228 ms QRS duration 150 ms QT/QTcB 446/434 ms P-R-T axes 83 41 235 Sinus bradycardia with 1st degree A-V block Left bundle branch block Abnormal ECG When compared with ECG of 02-May-2023 11:05, PREVIOUS ECG IS PRESENT  Echo 03/02/22 demonstrated: 1. Left ventricular ejection fraction, by estimation, is 40 to 45%. The  left ventricle has mildly decreased function. The left ventricle  demonstrates global hypokinesis. There is mild concentric left ventricular  hypertrophy. Left ventricular diastolic  parameters are indeterminate.   2.  Right ventricular systolic function is normal. The right ventricular  size is normal. There is normal pulmonary artery systolic pressure.   3. The mitral valve is normal in structure. Mild to moderate mitral valve  regurgitation. No evidence of mitral stenosis.   4. Tricuspid valve regurgitation is moderate.   5. The aortic valve is normal in structure. Aortic valve regurgitation is  mild. Aortic valve sclerosis/calcification is present, without any  evidence of aortic stenosis. Aortic regurgitation PHT measures 691 msec.   6. The inferior vena cava is normal in size with greater than 50%  respiratory variability, suggesting right atrial pressure of 3 mmHg.   7. Agitated saline contrast bubble study was negative, with no evidence  of any interatrial shunt.   Epic records are reviewed at length today.  CHA2DS2-VASc Score = 6  The patient's score is based upon: CHF History: 0 HTN History: 1 Diabetes History: 0 Stroke History: 2 Vascular Disease History: 0 Age Score: 2 Gender Score: 1  ASSESSMENT AND PLAN: Paroxysmal Atrial Fibrillation (ICD10:  I48.0) The patient's CHA2DS2-VASc score is 6, indicating a 9.7% annual risk of stroke.    She is currently in NSR.  After discussion with patient and daughter, we will continue with conservative observation via ILR device checks.    Secondary Hypercoagulable State (ICD10:  D68.69) The patient is at significant risk for stroke/thromboembolism based upon her CHA2DS2-VASc Score of 6.  Continue Eliquis  2.5 mg BID. Dose is correct based on age and weight. No missed doses.      Follow up Afib clinic 1 year.   Fairy Heinrich, PA-C Afib Clinic [facility] [address] [place], [place] [address] [phone] 10/16/2023 11:41 AM

## 2023-11-12 ENCOUNTER — Ambulatory Visit (INDEPENDENT_AMBULATORY_CARE_PROVIDER_SITE_OTHER): Payer: Medicare Other

## 2023-11-12 DIAGNOSIS — I639 Cerebral infarction, unspecified: Secondary | ICD-10-CM | POA: Diagnosis not present

## 2023-11-13 LAB — CUP PACEART REMOTE DEVICE CHECK
Date Time Interrogation Session: 20250210080309
Implantable Pulse Generator Implant Date: 20230807
Pulse Gen Serial Number: 511011222

## 2023-11-15 ENCOUNTER — Encounter: Payer: Self-pay | Admitting: Cardiology

## 2023-11-16 NOTE — Progress Notes (Signed)
[employer]

## 2023-12-17 ENCOUNTER — Ambulatory Visit (INDEPENDENT_AMBULATORY_CARE_PROVIDER_SITE_OTHER): Payer: Medicare Other

## 2023-12-17 DIAGNOSIS — I639 Cerebral infarction, unspecified: Secondary | ICD-10-CM

## 2023-12-17 LAB — CUP PACEART REMOTE DEVICE CHECK
Date Time Interrogation Session: 20250317070326
Implantable Pulse Generator Implant Date: 20230807
Pulse Gen Model: 5000
Pulse Gen Serial Number: 511011222

## 2023-12-18 ENCOUNTER — Encounter: Payer: Self-pay | Admitting: Cardiology

## 2023-12-19 NOTE — Progress Notes (Signed)
 [employer]

## 2023-12-19 NOTE — Addendum Note (Signed)
 Addended by: Geralyn Flash D on: 12/19/2023 04:42 PM   Modules accepted: Orders

## 2024-01-14 ENCOUNTER — Other Ambulatory Visit (HOSPITAL_COMMUNITY): Payer: Self-pay | Admitting: Internal Medicine

## 2024-01-21 ENCOUNTER — Ambulatory Visit (INDEPENDENT_AMBULATORY_CARE_PROVIDER_SITE_OTHER): Payer: Medicare Other

## 2024-01-21 DIAGNOSIS — I639 Cerebral infarction, unspecified: Secondary | ICD-10-CM

## 2024-01-22 LAB — CUP PACEART REMOTE DEVICE CHECK
Date Time Interrogation Session: 20250421070132
Implantable Pulse Generator Implant Date: 20230807
Pulse Gen Model: 5000
Pulse Gen Serial Number: 511011222

## 2024-01-23 ENCOUNTER — Ambulatory Visit: Payer: Medicare Other

## 2024-01-26 ENCOUNTER — Encounter: Payer: Self-pay | Admitting: Cardiology

## 2024-02-04 NOTE — Progress Notes (Signed)
 [employer]

## 2024-02-27 ENCOUNTER — Ambulatory Visit (INDEPENDENT_AMBULATORY_CARE_PROVIDER_SITE_OTHER): Payer: Medicare Other

## 2024-02-27 DIAGNOSIS — I639 Cerebral infarction, unspecified: Secondary | ICD-10-CM | POA: Diagnosis not present

## 2024-02-28 LAB — CUP PACEART REMOTE DEVICE CHECK
Date Time Interrogation Session: 20250528040031
Implantable Pulse Generator Implant Date: 20230807
Pulse Gen Model: 5000
Pulse Gen Serial Number: 511011222

## 2024-03-02 ENCOUNTER — Ambulatory Visit: Payer: Self-pay | Admitting: Cardiology

## 2024-03-11 NOTE — Progress Notes (Signed)
Merlin loop Recorder 

## 2024-03-31 ENCOUNTER — Ambulatory Visit

## 2024-03-31 DIAGNOSIS — I639 Cerebral infarction, unspecified: Secondary | ICD-10-CM | POA: Diagnosis not present

## 2024-03-31 LAB — CUP PACEART REMOTE DEVICE CHECK
Date Time Interrogation Session: 20250630050820
Implantable Pulse Generator Implant Date: 20230807
Pulse Gen Model: 5000
Pulse Gen Serial Number: 511011222

## 2024-04-02 ENCOUNTER — Ambulatory Visit: Payer: Medicare Other

## 2024-04-02 ENCOUNTER — Ambulatory Visit: Payer: Self-pay | Admitting: Cardiology

## 2024-04-21 NOTE — Progress Notes (Signed)
 [facility] Summary Report / Loop Recorder

## 2024-04-21 NOTE — Addendum Note (Signed)
 Addended by: VICCI SELLER A on: 04/21/2024 10:36 AM   Modules accepted: Orders

## 2024-05-01 ENCOUNTER — Ambulatory Visit (INDEPENDENT_AMBULATORY_CARE_PROVIDER_SITE_OTHER)

## 2024-05-01 DIAGNOSIS — I639 Cerebral infarction, unspecified: Secondary | ICD-10-CM

## 2024-05-01 LAB — CUP PACEART REMOTE DEVICE CHECK
Date Time Interrogation Session: 20250731040233
Implantable Pulse Generator Implant Date: 20230807
Pulse Gen Model: 5000
Pulse Gen Serial Number: 511011222

## 2024-05-05 ENCOUNTER — Ambulatory Visit: Payer: Self-pay | Admitting: Cardiology

## 2024-05-07 ENCOUNTER — Ambulatory Visit: Payer: Medicare Other

## 2024-06-02 ENCOUNTER — Ambulatory Visit

## 2024-06-02 DIAGNOSIS — I639 Cerebral infarction, unspecified: Secondary | ICD-10-CM

## 2024-06-04 LAB — CUP PACEART REMOTE DEVICE CHECK
Date Time Interrogation Session: 20250831053636
Implantable Pulse Generator Implant Date: 20230807
Pulse Gen Model: 5000
Pulse Gen Serial Number: 511011222

## 2024-06-07 ENCOUNTER — Ambulatory Visit: Payer: Self-pay | Admitting: Cardiology

## 2024-06-10 NOTE — Progress Notes (Signed)
 Remote Loop Recorder Transmission

## 2024-06-11 ENCOUNTER — Ambulatory Visit: Payer: Medicare Other

## 2024-07-01 NOTE — Progress Notes (Signed)
 Remote Loop Recorder Transmission

## 2024-07-02 NOTE — Progress Notes (Signed)
 Remote Loop Recorder Transmission

## 2024-07-03 ENCOUNTER — Ambulatory Visit

## 2024-07-03 DIAGNOSIS — I639 Cerebral infarction, unspecified: Secondary | ICD-10-CM

## 2024-07-03 LAB — CUP PACEART REMOTE DEVICE CHECK
Date Time Interrogation Session: 20251002060447
Implantable Pulse Generator Implant Date: 20230807
Pulse Gen Model: 5000
Pulse Gen Serial Number: 511011222

## 2024-07-04 ENCOUNTER — Ambulatory Visit: Payer: Self-pay | Admitting: Cardiology

## 2024-07-04 NOTE — Progress Notes (Signed)
 Remote Loop Recorder Transmission

## 2024-07-14 ENCOUNTER — Ambulatory Visit

## 2024-08-04 ENCOUNTER — Ambulatory Visit

## 2024-08-04 DIAGNOSIS — I639 Cerebral infarction, unspecified: Secondary | ICD-10-CM | POA: Diagnosis not present

## 2024-08-04 LAB — CUP PACEART REMOTE DEVICE CHECK
Date Time Interrogation Session: 20251103044454
Implantable Pulse Generator Implant Date: 20230807
Pulse Gen Model: 5000
Pulse Gen Serial Number: 511011222

## 2024-08-05 ENCOUNTER — Ambulatory Visit: Payer: Self-pay | Admitting: Cardiology

## 2024-08-07 NOTE — Progress Notes (Signed)
 Remote Loop Recorder Transmission

## 2024-08-14 ENCOUNTER — Ambulatory Visit

## 2024-09-04 ENCOUNTER — Ambulatory Visit

## 2024-09-06 LAB — CUP PACEART REMOTE DEVICE CHECK
Date Time Interrogation Session: 20251204050317
Implantable Pulse Generator Implant Date: 20230807
Pulse Gen Model: 5000
Pulse Gen Serial Number: 511011222

## 2024-09-10 NOTE — Progress Notes (Signed)
 Remote Loop Recorder Transmission

## 2024-09-15 ENCOUNTER — Ambulatory Visit

## 2024-09-19 ENCOUNTER — Ambulatory Visit: Payer: Self-pay | Admitting: Cardiology

## 2024-10-06 ENCOUNTER — Ambulatory Visit

## 2024-10-06 DIAGNOSIS — I639 Cerebral infarction, unspecified: Secondary | ICD-10-CM

## 2024-10-06 LAB — CUP PACEART REMOTE DEVICE CHECK
Date Time Interrogation Session: 20260104050449
Implantable Pulse Generator Implant Date: 20230807
Pulse Gen Model: 5000
Pulse Gen Serial Number: 511011222

## 2024-10-09 NOTE — Progress Notes (Signed)
 Remote Loop Recorder Transmission

## 2024-10-11 ENCOUNTER — Ambulatory Visit: Payer: Self-pay | Admitting: Cardiology

## 2024-10-13 ENCOUNTER — Encounter: Payer: Self-pay | Admitting: Podiatry

## 2024-10-13 ENCOUNTER — Ambulatory Visit: Admitting: Podiatry

## 2024-10-13 VITALS — Ht 60.0 in | Wt 100.0 lb

## 2024-10-13 DIAGNOSIS — M79675 Pain in left toe(s): Secondary | ICD-10-CM

## 2024-10-13 DIAGNOSIS — M7751 Other enthesopathy of right foot: Secondary | ICD-10-CM | POA: Diagnosis not present

## 2024-10-13 DIAGNOSIS — M79674 Pain in right toe(s): Secondary | ICD-10-CM

## 2024-10-13 DIAGNOSIS — B351 Tinea unguium: Secondary | ICD-10-CM | POA: Diagnosis not present

## 2024-10-13 MED ORDER — TRIAMCINOLONE ACETONIDE 10 MG/ML IJ SUSP
10.0000 mg | Freq: Once | INTRAMUSCULAR | Status: AC
  Administered 2024-10-13: 10 mg via INTRA_ARTICULAR

## 2024-10-14 NOTE — Progress Notes (Signed)
 Subjective:   Patient ID: Mackenzie Madden, female   DOB: 89 y.o.   MRN: [id number]   HPI Patient presents with 3 painful lesions and nail disease with thickness 1-5 both feet and is with caregiver.  Cannot take care of herself   ROS      Objective:  Physical Exam  Neurovascular status unchanged with thick yellow brittle nailbeds 1-5 both feet that are painful and lesions that are painful she cannot take care of     Assessment:  Chronic lesion formation chronic nail disease 1-5 both feet     Plan:  H&P reviewed and today I debrided nailbeds 1-5 both feet debrided lesions x 3 no iatrogenic bleeding reappoint routine care

## 2024-10-16 ENCOUNTER — Ambulatory Visit

## 2024-10-16 ENCOUNTER — Other Ambulatory Visit: Payer: Self-pay

## 2024-10-16 ENCOUNTER — Encounter (HOSPITAL_BASED_OUTPATIENT_CLINIC_OR_DEPARTMENT_OTHER): Payer: Self-pay

## 2024-10-16 ENCOUNTER — Emergency Department (HOSPITAL_BASED_OUTPATIENT_CLINIC_OR_DEPARTMENT_OTHER)
Admission: EM | Admit: 2024-10-16 | Discharge: 2024-10-16 | Disposition: A | Discharge status: Home / Self Care | Attending: Emergency Medicine | Admitting: Emergency Medicine

## 2024-10-16 ENCOUNTER — Emergency Department (HOSPITAL_BASED_OUTPATIENT_CLINIC_OR_DEPARTMENT_OTHER)

## 2024-10-16 ENCOUNTER — Emergency Department (HOSPITAL_BASED_OUTPATIENT_CLINIC_OR_DEPARTMENT_OTHER): Admitting: Radiology

## 2024-10-16 DIAGNOSIS — I110 Hypertensive heart disease with heart failure: Secondary | ICD-10-CM | POA: Diagnosis not present

## 2024-10-16 DIAGNOSIS — I4891 Unspecified atrial fibrillation: Secondary | ICD-10-CM | POA: Diagnosis not present

## 2024-10-16 DIAGNOSIS — Z950 Presence of cardiac pacemaker: Secondary | ICD-10-CM | POA: Diagnosis not present

## 2024-10-16 DIAGNOSIS — E039 Hypothyroidism, unspecified: Secondary | ICD-10-CM | POA: Diagnosis not present

## 2024-10-16 DIAGNOSIS — I509 Heart failure, unspecified: Secondary | ICD-10-CM | POA: Diagnosis not present

## 2024-10-16 DIAGNOSIS — Z7901 Long term (current) use of anticoagulants: Secondary | ICD-10-CM | POA: Diagnosis not present

## 2024-10-16 DIAGNOSIS — R410 Disorientation, unspecified: Secondary | ICD-10-CM | POA: Diagnosis present

## 2024-10-16 DIAGNOSIS — N3000 Acute cystitis without hematuria: Secondary | ICD-10-CM | POA: Insufficient documentation

## 2024-10-16 DIAGNOSIS — Z79899 Other long term (current) drug therapy: Secondary | ICD-10-CM | POA: Insufficient documentation

## 2024-10-16 LAB — COMPREHENSIVE METABOLIC PANEL WITH GFR
ALT: 8 U/L (ref 0–44)
AST: 24 U/L (ref 15–41)
Albumin: 4.3 g/dL (ref 3.5–5.0)
Alkaline Phosphatase: 75 U/L (ref 38–126)
Anion gap: 12 (ref 5–15)
BUN: 13 mg/dL (ref 8–23)
CO2: 28 mmol/L (ref 22–32)
Calcium: 10.5 mg/dL — ABNORMAL HIGH (ref 8.9–10.3)
Chloride: 98 mmol/L (ref 98–111)
Creatinine, Ser: 0.96 mg/dL (ref 0.44–1.00)
GFR, Estimated: 55 mL/min — ABNORMAL LOW
Glucose, Bld: 128 mg/dL — ABNORMAL HIGH (ref 70–99)
Potassium: 3.9 mmol/L (ref 3.5–5.1)
Sodium: 138 mmol/L (ref 135–145)
Total Bilirubin: 0.4 mg/dL (ref 0.0–1.2)
Total Protein: 7.7 g/dL (ref 6.5–8.1)

## 2024-10-16 LAB — CBC WITH DIFFERENTIAL/PLATELET
Abs Immature Granulocytes: 0.02 K/uL (ref 0.00–0.07)
Basophils Absolute: 0.1 K/uL (ref 0.0–0.1)
Basophils Relative: 1 %
Eosinophils Absolute: 0.1 K/uL (ref 0.0–0.5)
Eosinophils Relative: 1 %
HCT: 43.2 % (ref 36.0–46.0)
Hemoglobin: 14.6 g/dL (ref 12.0–15.0)
Immature Granulocytes: 0 %
Lymphocytes Relative: 24 %
Lymphs Abs: 2.4 K/uL (ref 0.7–4.0)
MCH: 32.1 pg (ref 26.0–34.0)
MCHC: 33.8 g/dL (ref 30.0–36.0)
MCV: 94.9 fL (ref 80.0–100.0)
Monocytes Absolute: 1.1 K/uL — ABNORMAL HIGH (ref 0.1–1.0)
Monocytes Relative: 11 %
Neutro Abs: 6.4 K/uL (ref 1.7–7.7)
Neutrophils Relative %: 63 %
Platelets: 167 K/uL (ref 150–400)
RBC: 4.55 MIL/uL (ref 3.87–5.11)
RDW: 13.2 % (ref 11.5–15.5)
WBC: 10.1 K/uL (ref 4.0–10.5)
nRBC: 0 % (ref 0.0–0.2)

## 2024-10-16 LAB — URINALYSIS, ROUTINE W REFLEX MICROSCOPIC
Bilirubin Urine: NEGATIVE
Glucose, UA: NEGATIVE mg/dL
Ketones, ur: NEGATIVE mg/dL
Nitrite: POSITIVE — AB
Specific Gravity, Urine: 1.011 (ref 1.005–1.030)
WBC, UA: >50 WBC/hpf (ref 0–5)
pH: 7 (ref 5.0–8.0)

## 2024-10-16 LAB — T4, FREE: Free T4: 1.31 ng/dL (ref 0.80–2.00)

## 2024-10-16 LAB — TSH: TSH: 11.7 u[IU]/mL — ABNORMAL HIGH (ref 0.350–4.500)

## 2024-10-16 MED ORDER — SODIUM CHLORIDE 0.9 % IV SOLN
1.0000 g | Freq: Once | INTRAVENOUS | Status: AC
  Administered 2024-10-16: 1 g via INTRAVENOUS
  Filled 2024-10-16: qty 10

## 2024-10-16 MED ORDER — HALOPERIDOL LACTATE 5 MG/ML IJ SOLN
2.5000 mg | Freq: Once | INTRAMUSCULAR | Status: DC
  Filled 2024-10-16: qty 1

## 2024-10-16 MED ORDER — FOSFOMYCIN TROMETHAMINE 3 G PO PACK
3.0000 g | PACK | Freq: Once | ORAL | Status: AC
  Administered 2024-10-16: 3 g via ORAL
  Filled 2024-10-16: qty 3

## 2024-10-16 MED ORDER — SODIUM CHLORIDE 0.9 % IV BOLUS
500.0000 mL | Freq: Once | INTRAVENOUS | Status: AC
  Administered 2024-10-16: 500 mL via INTRAVENOUS

## 2024-10-16 NOTE — ED Triage Notes (Signed)
 Pt w daughter, extremely HOH, advises pt has been talking strangely, a lot of things she's saying do not make sense. She's prone to UTI. Advises that she usually doesn't have many UTI symptoms, but if she does she's doesn't really know it. Denies HA or additional concerning symptoms, it's just the random bits of babbling.

## 2024-10-16 NOTE — ED Provider Notes (Signed)
 " [place] EMERGENCY DEPARTMENT AT [facility] Provider Note   CSN: [id number] Arrival date & time: 10/16/24  1329     Patient presents with: No chief complaint on file.   Mackenzie Madden is a 89 y.o. female with past medical history of hypertension, hypothyroidism, CHF, prior stroke, A-fib, implantable pacemaker, who presents to the emergency department for evaluation of confusion.  History is provided by the daughter, who states that patient has been increasingly more confused this week.  Patient's daughter states patient said things such as I went to [place]  yesterday, which per the daughter is a fall statement.  Daughter states that patient has a history of UTIs.  Patient is not currently reporting any pain or discomfort.  Patient does not have a history of dementia or prior memory loss.   HPI     Prior to Admission medications  Medication Sig Start Date End Date Taking? Authorizing Provider  acetaminophen  (TYLENOL ) 325 MG tablet Take 2 tablets (650 mg total) by mouth every 6 (six) hours as needed for mild pain (or Fever >/= 101). Patient taking differently: Take 325 mg by mouth as needed for mild pain (pain score 1-3) (or Fever >/= 101). 02/07/22   Angiulli, Toribio PARAS, PA-C  amLODipine  (NORVASC ) 5 MG tablet Take 1 tablet (5 mg total) by mouth daily. 03/04/22   Samtani, Jai-Gurmukh, MD  apixaban  (ELIQUIS ) 2.5 MG TABS tablet TAKE 1 TABLET BY MOUTH TWICE A DAY 01/14/24   Terra Fairy PARAS, PA-C  Calcium  250 MG CAPS Take 250 mg by mouth daily.    [provider]  CRANBERRY EXTRACT PO Take 1 tablet by mouth daily.    [provider]  dorzolamide -timolol  (COSOPT ) 22.3-6.8 MG/ML ophthalmic solution Place 1 drop into both eyes 2 (two) times daily. 01/12/22   [provider]  latanoprost  (XALATAN ) 0.005 % ophthalmic solution Place 1 drop into both eyes at bedtime. 01/11/15   [provider]  levothyroxine  (SYNTHROID ) 88 MCG tablet Take 1  tablet (88 mcg total) by mouth daily before breakfast. 02/07/22   Angiulli, Toribio PARAS, PA-C  Multiple Vitamin (MULTIVITAMIN WITH MINERALS) TABS tablet Take 1 tablet by mouth daily.    [provider]  rosuvastatin  (CRESTOR ) 10 MG tablet Take 1 tablet (10 mg total) by mouth daily. 03/03/22   Samtani, Jai-Gurmukh, MD    Allergies: Patient has no known allergies.    Review of Systems  Constitutional:  Positive for activity change.    Updated Vital Signs BP 138/71 (BP Location: Left Arm)   Pulse 87   Temp 98.1 F (36.7 C) (Oral)   Resp (!) 22   SpO2 97%   Physical Exam Vitals and nursing note reviewed.  Constitutional:      Appearance: Normal appearance.  HENT:     Head: Normocephalic and atraumatic.     Mouth/Throat:     Mouth: Mucous membranes are moist.  Eyes:     General: No scleral icterus.       Right eye: No discharge.        Left eye: No discharge.     Conjunctiva/sclera: Conjunctivae normal.  Cardiovascular:     Rate and Rhythm: Normal rate and regular rhythm.     Pulses: Normal pulses.  Pulmonary:     Effort: Pulmonary effort is normal.     Breath sounds: Normal breath sounds.  Abdominal:     General: There is no distension.     Tenderness: There is no abdominal tenderness.  Musculoskeletal:        General: No deformity.     Cervical back: Normal range of motion.  Skin:    General: Skin is warm and dry.     Capillary Refill: Capillary refill takes less than 2 seconds.  Neurological:     Mental Status: She is alert.     Motor: No weakness.  Psychiatric:        Mood and Affect: Mood normal.     (all labs ordered are listed, but only abnormal results are displayed) Labs Reviewed  CBC WITH DIFFERENTIAL/PLATELET - Abnormal; Notable for the following components:      Result Value   Monocytes Absolute 1.1 (*)    All other components within normal limits  COMPREHENSIVE METABOLIC PANEL WITH GFR - Abnormal; Notable for the following components:   Glucose,  Bld 128 (*)    Calcium  10.5 (*)    GFR, Estimated 55 (*)    All other components within normal limits  TSH - Abnormal; Notable for the following components:   TSH 11.700 (*)    All other components within normal limits  URINALYSIS, ROUTINE W REFLEX MICROSCOPIC - Abnormal; Notable for the following components:   APPearance CLOUDY (*)    Hgb urine dipstick TRACE (*)    Protein, ur TRACE (*)    Nitrite POSITIVE (*)    Leukocytes,Ua LARGE (*)    Bacteria, UA MANY (*)    All other components within normal limits  URINE CULTURE  T4, FREE    EKG: None  Radiology: DG Chest 2 View Result Date: 10/16/2024 CLINICAL DATA:  Altered mental status EXAM: CHEST - 2 VIEW COMPARISON:  January 23, 2022 FINDINGS: Stable cardiomediastinal silhouette. Both lungs are clear. The visualized skeletal structures are unremarkable. IMPRESSION: No active cardiopulmonary disease. Electronically Signed   By: Lynwood Landy Raddle M.D.   On: 10/16/2024 15:11   CT ABDOMEN PELVIS WO CONTRAST Result Date: 10/16/2024 CLINICAL DATA:  Abdominal pain.  Altered mental status. EXAM: CT ABDOMEN AND PELVIS WITHOUT CONTRAST TECHNIQUE: Multidetector CT imaging of the abdomen and pelvis was performed following the standard protocol without IV contrast. RADIATION DOSE REDUCTION: This exam was performed according to the departmental dose-optimization program which includes automated exposure control, adjustment of the mA and/or kV according to patient size and/or use of iterative reconstruction technique. COMPARISON:  CT abdomen and pelvis dated 05/19/2011, lumbar spine radiograph dated 10/27/2018 FINDINGS: Lower chest: No focal consolidation or pulmonary nodule in the lung bases. No pleural effusion or pneumothorax demonstrated. Partially imaged heart size is normal. Partially imaged implanted loop recorder within the left anterior chest wall. Coronary artery calcifications. Hepatobiliary: No focal hepatic lesions. Unchanged mild intrahepatic  bile duct dilation and common bile duct dilation measuring 2.3 cm status post cholecystectomy. Pancreas: No focal lesions or main ductal dilation. Spleen: Similar lobulated appearance of the spleen. Adrenals/Urinary Tract: No adrenal nodules. Right lower pole simple cyst measures 3.6 cm. Exophytic left anterolateral upper pole lesion measuring 1.6 cm (2:19) may represent a hemorrhagic/proteinaceous cyst with punctate peripheral calcification. Additional subcentimeter left renal hypodensities, too small to characterize. No hydronephrosis. Bilateral punctate calcifications may represent nonobstructing stones. Punctate focus of gas within the intraluminal urinary bladder. No focal bladder wall thickening. Stomach/Bowel: Normal appearance of the stomach. Small lateral gastric fundal diverticulum. Small duodenal diverticulum arising from the distal duodenum. No evidence of bowel wall thickening, distention, or inflammatory changes. Colonic diverticulosis without acute diverticulitis. Appendix is not discretely seen. Vascular/Lymphatic: Aortic atherosclerosis. No enlarged abdominal  or pelvic lymph nodes. Reproductive: No adnexal masses. Other: No free fluid, fluid collection, or free air. Musculoskeletal: Further compression of L2 vertebral body. Right proximal femoral fixation hardware appears intact. Degenerative changes of the left hip. IMPRESSION: 1. No acute abdominopelvic abnormality. 2. Further compression of L2 vertebral body compared to 10/27/2018. 3.  Aortic Atherosclerosis (ICD10-I70.0). Electronically Signed   By: Limin  Xu M.D.   On: 10/16/2024 15:05   CT Head Wo Contrast Result Date: 10/16/2024 EXAM: CT HEAD WITHOUT CONTRAST 10/16/2024 02:52:00 PM TECHNIQUE: CT of the head was performed without the administration of intravenous contrast. Automated exposure control, iterative reconstruction, and/or weight based adjustment of the mA/kV was utilized to reduce the radiation dose to as low as reasonably  achievable. COMPARISON: Head CT and MRI 03/01/2022. CLINICAL HISTORY: AMS. FINDINGS: BRAIN AND VENTRICLES: There is no evidence of an acute infarct, intracranial hemorrhage, mass, midline shift, hydrocephalus, or extra-axial fluid collection. There is mild cerebral atrophy. Patchy and confluent hypodensities in the cerebral white matter bilaterally have slightly progressed and are nonspecific but compatible with extensive chronic small vessel ischemic changes. Calcified atherosclerosis at the skull base. ORBITS: Bilateral cataract extraction. SINUSES: No acute abnormality. SOFT TISSUES AND SKULL: No acute soft tissue abnormality. No skull fracture. IMPRESSION: 1. No acute intracranial abnormality. 2. Extensive chronic small vessel ischemic disease. Electronically signed by: Dasie Hamburg MD 10/16/2024 02:57 PM EST RP Workstation: [id number]    Procedures   Medications Ordered in the ED  haloperidol  lactate (HALDOL ) injection 2.5 mg (0 mg Intravenous Hold 10/16/24 1743)  cefTRIAXone  (ROCEPHIN ) 1 g in sodium chloride  0.9 % 100 mL IVPB (0 g Intravenous Stopped 10/16/24 1522)  sodium chloride  0.9 % bolus 500 mL (0 mLs Intravenous Stopped 10/16/24 1745)  fosfomycin  (MONUROL ) packet 3 g (3 g Oral Given 10/16/24 1640)                                   Medical Decision Making Amount and/or Complexity of Data Reviewed Labs: ordered. Radiology: ordered.  Risk Prescription drug management.   This patient presents to the ED for concern of confusion/AMS, this involves an extensive number of treatment options, and is a complaint that carries with it a high risk of complications and morbidity.   Differential diagnosis includes: Subdural bleed, stroke, UTI, sepsis, pyelonephritis, polypharmacy, improper pacemaker function  Co morbidities:  history of UTI, hypertension, hypothyroid, chronic heart failure   Lab Tests:  I Ordered, and personally interpreted labs.  The pertinent results include:    -TSH:  11.7 - UA: Nitrite positive, large leukocytes, many bacteria, greater than 50 white blood cells  Imaging Studies:  I ordered imaging studies including chest x-ray, CT head I independently visualized and interpreted imaging which showed no acute abnormality that would explain her symptoms I agree with the radiologist interpretation  Cardiac Monitoring/ECG:  The patient was maintained on a cardiac monitor.  I personally viewed and interpreted the cardiac monitored which showed an underlying rhythm of: Sinus rhythm  Medicines ordered and prescription drug management:  I ordered medication including  Medications  haloperidol  lactate (HALDOL ) injection 2.5 mg (0 mg Intravenous Hold 10/16/24 1743)  cefTRIAXone  (ROCEPHIN ) 1 g in sodium chloride  0.9 % 100 mL IVPB (0 g Intravenous Stopped 10/16/24 1522)  sodium chloride  0.9 % bolus 500 mL (0 mLs Intravenous Stopped 10/16/24 1745)  fosfomycin  (MONUROL ) packet 3 g (3 g Oral Given 10/16/24 1640)   for UTI  Reevaluation of the patient after these medicines showed that the patient improved I have reviewed the patients home medicines and have made adjustments as needed  Test Considered:   none  Critical Interventions:   none  Consultations Obtained: None  Problem List / ED Course:     ICD-10-CM   1. Acute cystitis without hematuria  N30.00       MDM: 89 year old female who presents emergency department for evaluation of increased confusion.  Overall workup was unremarkable.  However, UA was significant for uncomplicated UTI.  No evidence of leukocytosis to suggest pyelonephritis.  Patient received 1 g of ceftriaxone  while in the emergency department as well as IV fluids.  Initial reassessment revealed patient becoming agitated and confused.  On second reassessment, after patient received fluid bolus, she appeared to calm down.  Her heart rate remained in the low 90s.  Per patient's daughter, who is at bedside she states patient is  significantly less confused when they first arrived.  We did have an extensive conversation about whether the patient should be admitted to the hospital for UTI with delirium versus going home.  Ultimately, we decided the patient would benefit from going home as she does have good resources and family support including her spouse and daughter who live close by.  Patient's daughter has been provided strict return precautions should the patient worsen.  On my assessment, patient's mentation has significantly improved.  Therefore, I am comfortable with this plan.  Patient's vital signs are stable.  Patient is appropriate for discharge at this time.    Dispostion:  After consideration of the diagnostic results and the patients response to treatment, I feel that the patient would benefit from supportive care.   Final diagnoses:  Acute cystitis without hematuria    ED Discharge Orders     None          Torrence Marry GORMAN DEVONNA 10/16/24 1824  "

## 2024-10-16 NOTE — Discharge Instructions (Addendum)
 It was a pleasure taking care of you today. You were seen in the Emergency Department for evaluation of confusion. Your work-up was reassuring. Your CT/Xray/Labs showed no acute intracranial abnormality to account for your symptoms.  However, your urine was significantly infected.  You were treated in the emergency department for a urinary tract infection.  You received IV antibiotics as well as a one-time dose of a medicine called fosfomycin  to treat this.  I have sent your urine for culture and if you need further antibiotics, we will contact you.  Refer to the attached documentation for further management of your symptoms.  Continue drinking plenty of water for adequate hydration.  Follow up with your PCP if your symptoms continue.  Please return to the ER if you experience chest pain, trouble breathing, intractable nausea/vomiting or any other life threatening illnesses.

## 2024-10-16 NOTE — ED Notes (Signed)
 Interrogation device incompatible with pt's pacemaker per [employer] customer service. Customer service reports pt should have an app on her phone to generate a report from but pt's daughter reports they left her phone at home. EDP notified.

## 2024-10-16 NOTE — ED Notes (Signed)
 Pt d/c instructions, medications, and follow-up care reviewed with pt and pt's daughter Mercy. Pt's daughter Mercy verbalized understanding and had no further questions at time of d/c. Pt ambulatory, and in NAD at time of d/c. Pt discharged home with daughter Mercy.

## 2024-10-18 LAB — URINE CULTURE: Culture: >=100000 — AB

## 2024-10-19 ENCOUNTER — Telehealth (HOSPITAL_BASED_OUTPATIENT_CLINIC_OR_DEPARTMENT_OTHER): Payer: Self-pay | Admitting: *Deleted

## 2024-10-19 NOTE — Telephone Encounter (Signed)
 Post ED Visit - Positive Culture Follow-up  Culture report reviewed by antimicrobial stewardship pharmacist: [facility] []  Rankin Dee, Pharm.D. []  Venetia Gully, Pharm.D., BCPS AQ-ID []  Garrel Crews, Pharm.D., BCPS []  Almarie Lunger, Pharm.D., BCPS []  [place], [place].D., BCPS, AAHIVP []  Rosaline Bihari, Pharm.D., BCPS, AAHIVP []  Vernell Meier, PharmD, BCPS []  Latanya Hint, PharmD, BCPS []  Donald Medley, PharmD, BCPS []  Rocky Bold, PharmD []  Dorothyann Alert, PharmD, BCPS [x]  Dorn Buttner,  PharmD  [facility] []  Rosaline Edison, PharmD []  Romona Bliss, PharmD []  Dolphus Roller, PharmD []  Veva Seip, Rph []  Vernell Daunt) Leonce, PharmD []  Eva Allis, PharmD []  Rosaline Millet, PharmD []  Iantha Batch, PharmD []  Arvin Gauss, PharmD []  Wanda Hasting, PharmD []  Ronal Rav, PharmD []  Rocky Slade, PharmD []  Bard Jeans, PharmD   Positive urine culture Treated with Rocephin  and Fosfomycin  in ED and no further patient follow-up is required at this time.  Albino Alan Novak 10/19/2024, 12:18 PM

## 2024-11-03 ENCOUNTER — Telehealth: Payer: Self-pay

## 2024-11-03 NOTE — Telephone Encounter (Addendum)
 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  Implanted 2023. Notified Dorise, [employer] notified due to short life on battery. Dorise states this is normal battery life expectancy for this model ILR:  3 years.  (If a lot of episodes, can be less).    Alert remote transmission:  Battery at end of service - route to triage

## 2024-11-05 NOTE — Telephone Encounter (Signed)
 Call back received.  Family has decided to abandon loop.

## 2024-11-05 NOTE — Telephone Encounter (Signed)
 Marked I in Paceart: done Enter note in Paceart:done Canceled future remotes: done Discontinued from website: done Entered in Speciality Comments:done  Await call back.  No action needed.

## 2024-11-06 ENCOUNTER — Ambulatory Visit

## 2024-11-17 ENCOUNTER — Ambulatory Visit

## 2024-12-06 ENCOUNTER — Ambulatory Visit

## 2025-01-06 ENCOUNTER — Ambulatory Visit

## 2025-02-06 ENCOUNTER — Ambulatory Visit

## 2025-03-09 ENCOUNTER — Ambulatory Visit

## 2025-04-09 ENCOUNTER — Ambulatory Visit

## 2025-05-10 ENCOUNTER — Ambulatory Visit

## 2025-06-10 ENCOUNTER — Ambulatory Visit

## 2025-07-11 ENCOUNTER — Ambulatory Visit

## 2025-08-11 ENCOUNTER — Ambulatory Visit

## 2025-09-11 ENCOUNTER — Ambulatory Visit

## 2025-10-12 ENCOUNTER — Ambulatory Visit

## 2025-11-12 ENCOUNTER — Ambulatory Visit
